# Patient Record
Sex: Male | Born: 1970
Health system: Southern US, Community
[De-identification: ages and names within clinical notes are randomized; demographics above are authoritative.]

## PROBLEM LIST (undated history)

## (undated) DIAGNOSIS — T7840XA Allergy, unspecified, initial encounter: Secondary | ICD-10-CM

## (undated) DIAGNOSIS — I2699 Other pulmonary embolism without acute cor pulmonale: Secondary | ICD-10-CM

## (undated) DIAGNOSIS — I82409 Acute embolism and thrombosis of unspecified deep veins of unspecified lower extremity: Secondary | ICD-10-CM

## (undated) DIAGNOSIS — G5600 Carpal tunnel syndrome, unspecified upper limb: Secondary | ICD-10-CM

## (undated) DIAGNOSIS — I10 Essential (primary) hypertension: Secondary | ICD-10-CM

## (undated) DIAGNOSIS — M1990 Unspecified osteoarthritis, unspecified site: Secondary | ICD-10-CM

## (undated) DIAGNOSIS — M25512 Pain in left shoulder: Secondary | ICD-10-CM

## (undated) DIAGNOSIS — I5030 Unspecified diastolic (congestive) heart failure: Secondary | ICD-10-CM

## (undated) DIAGNOSIS — G4730 Sleep apnea, unspecified: Secondary | ICD-10-CM

## (undated) DIAGNOSIS — K219 Gastro-esophageal reflux disease without esophagitis: Secondary | ICD-10-CM

## (undated) DIAGNOSIS — E669 Obesity, unspecified: Secondary | ICD-10-CM

## (undated) DIAGNOSIS — M25511 Pain in right shoulder: Secondary | ICD-10-CM

## (undated) HISTORY — DX: Obesity, unspecified: E66.9

## (undated) HISTORY — PX: OTHER SURGICAL HISTORY: SHX169

## (undated) HISTORY — PX: COLONOSCOPY: SHX174

## (undated) HISTORY — PX: ROTATOR CUFF REPAIR: SHX139

## (undated) HISTORY — DX: Pain in right shoulder: M25.512

## (undated) HISTORY — DX: Allergy, unspecified, initial encounter: T78.40XA

## (undated) HISTORY — DX: Acute embolism and thrombosis of unspecified deep veins of unspecified lower extremity: I82.409

## (undated) HISTORY — DX: Unspecified diastolic (congestive) heart failure: I50.30

## (undated) HISTORY — DX: Carpal tunnel syndrome, unspecified upper limb: G56.00

## (undated) HISTORY — DX: Pain in right shoulder: M25.511

---
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
## 1999-02-14 ENCOUNTER — Emergency Department (HOSPITAL_COMMUNITY): Admission: EM | Admit: 1999-02-14 | Discharge: 1999-02-15 | Payer: Self-pay | Admitting: *Deleted

## 1999-02-15 ENCOUNTER — Encounter: Payer: Self-pay | Admitting: Emergency Medicine

## 1999-11-09 ENCOUNTER — Ambulatory Visit (HOSPITAL_COMMUNITY): Admission: RE | Admit: 1999-11-09 | Discharge: 1999-11-09 | Payer: Self-pay | Admitting: Family Medicine

## 1999-11-09 ENCOUNTER — Encounter: Payer: Self-pay | Admitting: Family Medicine

## 2000-12-16 ENCOUNTER — Ambulatory Visit (HOSPITAL_BASED_OUTPATIENT_CLINIC_OR_DEPARTMENT_OTHER): Admission: RE | Admit: 2000-12-16 | Discharge: 2000-12-16 | Payer: Self-pay | Admitting: Orthopedic Surgery

## 2001-08-07 ENCOUNTER — Encounter: Admission: RE | Admit: 2001-08-07 | Discharge: 2001-11-05 | Payer: Self-pay | Admitting: Emergency Medicine

## 2002-06-15 ENCOUNTER — Inpatient Hospital Stay (HOSPITAL_COMMUNITY): Admission: RE | Admit: 2002-06-15 | Discharge: 2002-06-16 | Payer: Self-pay | Admitting: Orthopedic Surgery

## 2006-11-03 ENCOUNTER — Ambulatory Visit: Payer: Self-pay | Admitting: Family Medicine

## 2006-11-03 ENCOUNTER — Ambulatory Visit: Payer: Self-pay | Admitting: *Deleted

## 2007-04-17 ENCOUNTER — Ambulatory Visit: Payer: Self-pay | Admitting: Family Medicine

## 2007-04-17 LAB — CONVERTED CEMR LAB
Albumin: 4.8 g/dL (ref 3.5–5.2)
Alkaline Phosphatase: 39 units/L (ref 39–117)
BUN: 24 mg/dL — ABNORMAL HIGH (ref 6–23)
CO2: 27 meq/L (ref 19–32)
Cholesterol: 196 mg/dL (ref 0–200)
Glucose, Bld: 128 mg/dL — ABNORMAL HIGH (ref 70–99)
HDL: 41 mg/dL (ref >39)
Potassium: 4.4 meq/L (ref 3.5–5.3)
Sodium: 141 meq/L (ref 135–145)
Total Bilirubin: 1 mg/dL (ref 0.3–1.2)
Total Protein: 7.9 g/dL (ref 6.0–8.3)
Triglycerides: 118 mg/dL (ref <150)

## 2007-06-19 ENCOUNTER — Ambulatory Visit: Payer: Self-pay | Admitting: Family Medicine

## 2007-06-20 ENCOUNTER — Ambulatory Visit (HOSPITAL_COMMUNITY): Admission: RE | Admit: 2007-06-20 | Discharge: 2007-06-20 | Payer: Self-pay | Admitting: Family Medicine

## 2008-12-17 ENCOUNTER — Encounter: Admission: RE | Admit: 2008-12-17 | Discharge: 2008-12-17 | Payer: Self-pay | Admitting: Specialist

## 2009-09-10 ENCOUNTER — Ambulatory Visit (HOSPITAL_COMMUNITY): Admission: RE | Admit: 2009-09-10 | Discharge: 2009-09-10 | Payer: Self-pay | Admitting: Orthopedic Surgery

## 2009-09-10 ENCOUNTER — Encounter (INDEPENDENT_AMBULATORY_CARE_PROVIDER_SITE_OTHER): Payer: Self-pay | Admitting: Orthopedic Surgery

## 2009-09-10 ENCOUNTER — Ambulatory Visit: Payer: Self-pay | Admitting: Vascular Surgery

## 2010-03-30 ENCOUNTER — Encounter: Admission: RE | Admit: 2010-03-30 | Discharge: 2010-03-30 | Payer: Self-pay | Admitting: Family Medicine

## 2010-09-10 ENCOUNTER — Other Ambulatory Visit: Payer: Self-pay | Admitting: Family Medicine

## 2010-09-10 ENCOUNTER — Ambulatory Visit
Admission: RE | Admit: 2010-09-10 | Discharge: 2010-09-10 | Disposition: A | Discharge status: Home / Self Care | Payer: 59 | Source: Ambulatory Visit | Attending: Family Medicine | Admitting: Family Medicine

## 2010-09-10 DIAGNOSIS — R52 Pain, unspecified: Secondary | ICD-10-CM

## 2010-09-18 NOTE — Op Note (Signed)
NAME:  Scott [name], Scott [name]                      ACCOUNT NO.:  [id number]   MEDICAL RECORD NO.:  [id number]                   PATIENT TYPE:  AMB   LOCATION:  DAY                                  FACILITY:  [facility]   PHYSICIAN:  Marlowe Kays, M.D.               DATE OF BIRTH:  [birth year]   DATE OF PROCEDURE:  06/14/2002  DATE OF DISCHARGE:                                 OPERATIVE REPORT   PREOPERATIVE DIAGNOSIS:  Painful traumatic arthritis, right  acromioclavicular joint.   POSTOPERATIVE DIAGNOSIS:  Painful traumatic arthritis, right  acromioclavicular joint.   PROCEDURE:  Resection, distal right clavicle.   SURGEON:  Marlowe Kays, M.D.   ASSISTANT:  Nurse.   ANESTHESIA:  General.   PATHOLOGY AND JUSTIFICATION FOR PROCEDURE:  Injured his right shoulder over  two years ago with persistent pain at the [facility] joint, which was temporarily  relieved only by local injection.  MRI of the shoulder has been normal.  Because of the persistence of his symptoms, he is here today for surgical  treatment.   DESCRIPTION OF PROCEDURE:  Satisfactory general anesthesia, beach chair  position on the Schlein frame.  The right shoulder girdle was prepped with  Duraprep and draped in a sterile field.  Ioban employed.  Curved incision  centered basically at the [facility] joint.  The [facility] joint and distal clavicle were  identified and with subperiosteal dissection, I exposed the lateral clavicle  and measured out roughly 1.5 cm from the joint, where I marked the clavicle  with a cautery and then undermined it gently at this location and placed two  mini-Bennett retractors to protect the underlying structures.  I then used a  micro-saw and made my osteotomy at this point.  I then grasped the cut  fragment and resected it with a cutting cautery.  I checked, and there were  no bony spicules left on the cut end of the main clavicle, which I then  covered with bone wax.  The wound was irrigated with sterile  saline and the  defect from the resection of the clavicle was packed with Gelfoam.  I then  closed the periosteum-muscle-fascial complex over top with interrupted #1  Vicryl with a nice tight closure.  The subcutaneous tissue was closed with  interrupted 2-0 Vicryl and the skin with Steri-Strips.  A dry sterile  dressing and large shoulder immobilizer were applied.  No Marcaine was  utilized for local anesthesia because of preoperative interscalene block.  He tolerated the procedure well and at the time of this dictation was on his  way to the recovery room in satisfactory condition with no known  complications.                                                Fayrene Fearing  [place], M.D.    JA/MEDQ  D:  06/14/2002  T:  06/14/2002  Job:  [id number]

## 2010-09-18 NOTE — Op Note (Signed)
[place]  Patient:    Scott [name]                             Visit Number: [id number] MRN: [id number]          Service Type: DSU Location: [facility] Attending:  Teressa Senter Douglass Rivers Proc. Date: 12/16/00 Adm. Date:  [id number]   CC:         Eula Listen, M.D., [facility]  Anesthesia Department   Operative Report  PREOPERATIVE DIAGNOSIS:  Chronic ulnar-sided right wrist pain with clinical examination suggestive of possible lunotriquetral interosseous ligament tear and ulnar plus anatomy.  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  PROCEDURES: 1. Diagnostic arthroscopy, right wrist. 2. Arthroscopic debridement of a large dorsal ulnar synovial plica and    synovectomy of right wrist.  SURGEON:  Katy Fitch. Naaman Plummer., M.D.  ANESTHESIA:  General by LMA supervised by the anesthesiologist, J. Claybon Jabs, M.D.  INDICATIONS:  Scott [name] is a 40 year old man who was referred by Dr. Eula Listen from the urgent medical care center for evaluation and management of a chronic right wrist pain.  Clinical examination had suggested a possible LT interosseous ligament tear or chronic ulnocarpal abutment predicament.  Dr. Althea Charon referred him for an MR arthrogram of his wrist that was interpreted by Dr. Barrington Ellison, radiologist, to reveal a possible lunotriquetral ligamentous disruption and an interosseous ganglion along the proximal articular surface of the triquetrum suggestive of possible ulnocarpal abutment.  Due to a failure to respond to nonoperative measures, including steroid injection and splinting, he is brought to the operating room at this time for diagnostic arthroscopy and appropriate intervention.  Preoperatively we advised Mr. Willet that he might require ulnar shortening to decompress his  lunate and triquetrum.  His preoperative neutral films demonstrated that he was approximately 1 mm ulnar plus.  DESCRIPTION OF PROCEDURE:  [name] is brought to the operating room and placed in the supine position on the operating table.  Following induction of general anesthesia by LMA, the right arm was prepped with Betadine soap and solution and sterilely draped.  Ancef 1 g was administered as an IV prophylactic antibiotic.  The wrist was distracted with the aid of a tower designed for wrist arthroscopy with fingertraps on the index and long fingers and counter traction on the forearm.  Ten pounds of traction was applied.  The scope was introduced through standard blunt technique after distention of the wrist with sterile saline through the 3/4 dorsal portal.  Diagnostic arthroscopy revealed moderate synovitis on the dorsal radial aspect of the radioscaphoid articulation.  The hyaline articular cartilage surfaces on the scaphoid, radial aspect of the lunate, and most of the triquetrum were normal. There was normal hyaline articular cartilage in the scaphoid and lunate facets of the radius.  The triangular fibrocartilage had a partial tear at its insertion on the sigmoid notch but did not have a full-thickness tear.  The triangular fibrocartilage was stable.  There was a very large dorsal synovial plica-type shelf that was quite fibrotic and impeded passage of the scope to view the ulnocarpal articulation. With gentle manipulation I was able to get the scope to the ulnar aspect of the wrist and inspect the peripheral triangular fibrocartilage.  There was substantial synovitis on the ulnar aspect of the shelf.  A 6R  portal was created with blunt technique, sounding with an 18-gauge needle.  The suction shaver was used to debride the synovitis and inspect the ulnar aspect of the triangular fibrocartilage.  There was no significant peripheral tear.  The scope was then placed  in the 6R portal and the shaver brought in the 3/4 portal.  With some difficulty the entire dorsal synovial shelf plica was excised.  This opened the dorsal gutter and allowed full visualization of the lunate and triquetrum.  The lunotriquetral interosseous ligament was basically sound.  I suspect the arthrogram revealed a small pinhole tear but did not reveal a significant structural tear.  There was significant chondromalacia on the dorsal ulnar aspect of the lunate consistent with compression against the plica.  Given the relative lack of significant injury to the hyaline articular cartilage surfaces on the lunate and triquetrum, I did not feel that ulnar shortening was indicated.  I completed synovectomy along the dorsal aspect of the wrist, extending to the region of the second dorsal compartment, where Mr. Lappe had intermittent symptoms as well.  The joint was thoroughly lavaged with sterile saline, followed by removal of the arthroscopic equipment.  My final diagnostic impression is that his pain was due primarily to the synovial plica shelf, causing an overload syndrome on his lunate and triquetrum and chondromalacia on the lunate.  For aftercare, his wounds were dressed with Xeroflo, sterile gauze, and a volar plaster splint maintaining the wrist in neutral position.  He will be given prescriptions for Percocet 5 mg one or two tablets p.o. q.4-6h. p.r.n. pain, a total of 24 tablets without refill.  He will continue on his normal medication for this type 2 diabetes, and will return for follow-up in our office in one week to begin a therapy program.  He is advised to keep his dressing dry and his fingers moving to maintain a full range of motion. DD:  12/16/00 TD:  12/16/00 Job: [id number]

## 2011-02-05 ENCOUNTER — Emergency Department (HOSPITAL_COMMUNITY)
Admission: EM | Admit: 2011-02-05 | Discharge: 2011-02-05 | Disposition: A | Discharge status: Home / Self Care | Payer: No Typology Code available for payment source | Attending: Emergency Medicine | Admitting: Emergency Medicine

## 2011-02-05 ENCOUNTER — Emergency Department (HOSPITAL_COMMUNITY): Payer: No Typology Code available for payment source

## 2011-02-05 DIAGNOSIS — Z79899 Other long term (current) drug therapy: Secondary | ICD-10-CM | POA: Insufficient documentation

## 2011-02-05 DIAGNOSIS — T1490XA Injury, unspecified, initial encounter: Secondary | ICD-10-CM | POA: Insufficient documentation

## 2011-02-05 DIAGNOSIS — I10 Essential (primary) hypertension: Secondary | ICD-10-CM | POA: Insufficient documentation

## 2011-02-05 DIAGNOSIS — E119 Type 2 diabetes mellitus without complications: Secondary | ICD-10-CM | POA: Insufficient documentation

## 2011-02-05 DIAGNOSIS — M549 Dorsalgia, unspecified: Secondary | ICD-10-CM | POA: Insufficient documentation

## 2011-02-05 DIAGNOSIS — IMO0002 Reserved for concepts with insufficient information to code with codable children: Secondary | ICD-10-CM | POA: Insufficient documentation

## 2011-02-05 DIAGNOSIS — M545 Low back pain, unspecified: Secondary | ICD-10-CM | POA: Insufficient documentation

## 2011-02-05 DIAGNOSIS — Y92410 Unspecified street and highway as the place of occurrence of the external cause: Secondary | ICD-10-CM | POA: Insufficient documentation

## 2011-02-05 DIAGNOSIS — R51 Headache: Secondary | ICD-10-CM | POA: Insufficient documentation

## 2011-02-07 ENCOUNTER — Emergency Department (HOSPITAL_COMMUNITY)
Admission: EM | Admit: 2011-02-07 | Discharge: 2011-02-07 | Disposition: A | Discharge status: Home / Self Care | Payer: No Typology Code available for payment source | Attending: Emergency Medicine | Admitting: Emergency Medicine

## 2011-02-07 ENCOUNTER — Emergency Department (HOSPITAL_COMMUNITY): Payer: No Typology Code available for payment source

## 2011-02-07 DIAGNOSIS — E119 Type 2 diabetes mellitus without complications: Secondary | ICD-10-CM | POA: Insufficient documentation

## 2011-02-07 DIAGNOSIS — I10 Essential (primary) hypertension: Secondary | ICD-10-CM | POA: Insufficient documentation

## 2011-02-07 DIAGNOSIS — T148XXA Other injury of unspecified body region, initial encounter: Secondary | ICD-10-CM | POA: Insufficient documentation

## 2011-02-07 DIAGNOSIS — M546 Pain in thoracic spine: Secondary | ICD-10-CM | POA: Insufficient documentation

## 2011-02-07 DIAGNOSIS — S060X9A Concussion with loss of consciousness of unspecified duration, initial encounter: Secondary | ICD-10-CM | POA: Insufficient documentation

## 2011-02-07 DIAGNOSIS — R51 Headache: Secondary | ICD-10-CM | POA: Insufficient documentation

## 2011-05-04 DIAGNOSIS — I2699 Other pulmonary embolism without acute cor pulmonale: Secondary | ICD-10-CM

## 2011-05-04 HISTORY — DX: Other pulmonary embolism without acute cor pulmonale: I26.99

## 2011-09-02 ENCOUNTER — Emergency Department (HOSPITAL_COMMUNITY): Payer: 59

## 2011-09-02 ENCOUNTER — Encounter (HOSPITAL_COMMUNITY): Payer: Self-pay | Admitting: Emergency Medicine

## 2011-09-02 ENCOUNTER — Inpatient Hospital Stay (HOSPITAL_COMMUNITY)
Admission: EM | Admit: 2011-09-02 | Discharge: 2011-09-05 | DRG: 175 | Disposition: A | Discharge status: Home / Self Care | Payer: 59 | Attending: Family Medicine | Admitting: Family Medicine

## 2011-09-02 DIAGNOSIS — I152 Hypertension secondary to endocrine disorders: Secondary | ICD-10-CM | POA: Diagnosis present

## 2011-09-02 DIAGNOSIS — Z79899 Other long term (current) drug therapy: Secondary | ICD-10-CM

## 2011-09-02 DIAGNOSIS — I82431 Acute embolism and thrombosis of right popliteal vein: Secondary | ICD-10-CM | POA: Diagnosis present

## 2011-09-02 DIAGNOSIS — I824Y9 Acute embolism and thrombosis of unspecified deep veins of unspecified proximal lower extremity: Secondary | ICD-10-CM | POA: Diagnosis present

## 2011-09-02 DIAGNOSIS — I5189 Other ill-defined heart diseases: Secondary | ICD-10-CM | POA: Diagnosis present

## 2011-09-02 DIAGNOSIS — E1169 Type 2 diabetes mellitus with other specified complication: Secondary | ICD-10-CM | POA: Diagnosis present

## 2011-09-02 DIAGNOSIS — J9601 Acute respiratory failure with hypoxia: Secondary | ICD-10-CM | POA: Diagnosis present

## 2011-09-02 DIAGNOSIS — I2699 Other pulmonary embolism without acute cor pulmonale: Principal | ICD-10-CM | POA: Diagnosis present

## 2011-09-02 DIAGNOSIS — I519 Heart disease, unspecified: Secondary | ICD-10-CM | POA: Diagnosis present

## 2011-09-02 DIAGNOSIS — I82409 Acute embolism and thrombosis of unspecified deep veins of unspecified lower extremity: Secondary | ICD-10-CM

## 2011-09-02 DIAGNOSIS — I10 Essential (primary) hypertension: Secondary | ICD-10-CM | POA: Diagnosis present

## 2011-09-02 DIAGNOSIS — S93402A Sprain of unspecified ligament of left ankle, initial encounter: Secondary | ICD-10-CM | POA: Diagnosis present

## 2011-09-02 DIAGNOSIS — Z7901 Long term (current) use of anticoagulants: Secondary | ICD-10-CM

## 2011-09-02 DIAGNOSIS — E1159 Type 2 diabetes mellitus with other circulatory complications: Secondary | ICD-10-CM | POA: Diagnosis present

## 2011-09-02 DIAGNOSIS — Z23 Encounter for immunization: Secondary | ICD-10-CM

## 2011-09-02 DIAGNOSIS — E119 Type 2 diabetes mellitus without complications: Secondary | ICD-10-CM | POA: Diagnosis present

## 2011-09-02 DIAGNOSIS — R0902 Hypoxemia: Secondary | ICD-10-CM | POA: Diagnosis present

## 2011-09-02 DIAGNOSIS — J9600 Acute respiratory failure, unspecified whether with hypoxia or hypercapnia: Secondary | ICD-10-CM | POA: Diagnosis present

## 2011-09-02 HISTORY — DX: Essential (primary) hypertension: I10

## 2011-09-02 LAB — DIFFERENTIAL
Basophils Absolute: 0 10*3/uL (ref 0.0–0.1)
Eosinophils Absolute: 0 10*3/uL (ref 0.0–0.7)
Lymphs Abs: 1.4 10*3/uL (ref 0.7–4.0)
Monocytes Absolute: 0.7 10*3/uL (ref 0.1–1.0)
Monocytes Relative: 9 % (ref 3–12)
Neutro Abs: 5.3 10*3/uL (ref 1.7–7.7)

## 2011-09-02 LAB — CBC
HCT: 46.3 % (ref 39.0–52.0)
MCHC: 35 g/dL (ref 30.0–36.0)
MCV: 74.2 fL — ABNORMAL LOW (ref 78.0–100.0)
Platelets: 141 10*3/uL — ABNORMAL LOW (ref 150–400)
RDW: 13.7 % (ref 11.5–15.5)
WBC: 7.4 10*3/uL (ref 4.0–10.5)

## 2011-09-02 LAB — GLUCOSE, CAPILLARY
Glucose-Capillary: 149 mg/dL — ABNORMAL HIGH (ref 70–99)
Glucose-Capillary: 176 mg/dL — ABNORMAL HIGH (ref 70–99)

## 2011-09-02 LAB — HOMOCYSTEINE: Homocysteine: 7.5 umol/L (ref 4.0–15.4)

## 2011-09-02 LAB — POCT I-STAT, CHEM 8
Calcium, Ion: 1.17 mmol/L (ref 1.12–1.32)
HCT: 52 % (ref 39.0–52.0)
Hemoglobin: 17.7 g/dL — ABNORMAL HIGH (ref 13.0–17.0)
Sodium: 136 mEq/L (ref 135–145)
TCO2: 29 mmol/L (ref 0–100)

## 2011-09-02 LAB — POCT I-STAT TROPONIN I: Troponin i, poc: 0.17 ng/mL (ref 0.00–0.08)

## 2011-09-02 LAB — PROTIME-INR: Prothrombin Time: 13.8 seconds (ref 11.6–15.2)

## 2011-09-02 MED ORDER — ACETAMINOPHEN 650 MG RE SUPP: 650.0000 mg | Freq: Four times a day (QID) | RECTAL | Status: DC | PRN

## 2011-09-02 MED ORDER — HYDROCODONE-ACETAMINOPHEN 5-325 MG PO TABS
1.0000 | ORAL_TABLET | ORAL | Status: DC | PRN
  Administered 2011-09-02: 1 via ORAL
  Administered 2011-09-02 – 2011-09-03 (×2): 2 via ORAL
  Administered 2011-09-03: 1 via ORAL
  Administered 2011-09-04: 2 via ORAL
  Administered 2011-09-05: 1 via ORAL
  Filled 2011-09-02 (×2): qty 1
  Filled 2011-09-02: qty 2
  Filled 2011-09-02: qty 1
  Filled 2011-09-02 (×2): qty 2
  Filled 2011-09-02: qty 1

## 2011-09-02 MED ORDER — HEPARIN SODIUM (PORCINE) 5000 UNIT/ML IJ SOLN
INTRAMUSCULAR | Status: AC
  Filled 2011-09-02: qty 1

## 2011-09-02 MED ORDER — SODIUM CHLORIDE 0.9 % IV SOLN: INTRAVENOUS | Status: DC

## 2011-09-02 MED ORDER — IOHEXOL 350 MG/ML SOLN
100.0000 mL | Freq: Once | INTRAVENOUS | Status: AC | PRN
  Administered 2011-09-02: 100 mL via INTRAVENOUS

## 2011-09-02 MED ORDER — ENOXAPARIN SODIUM 120 MG/0.8ML ~~LOC~~ SOLN
110.0000 mg | Freq: Once | SUBCUTANEOUS | Status: AC
  Administered 2011-09-02: 110 mg via SUBCUTANEOUS
  Filled 2011-09-02: qty 0.8

## 2011-09-02 MED ORDER — HEPARIN (PORCINE) IN NACL 100-0.45 UNIT/ML-% IJ SOLN
1400.0000 [IU]/h | Freq: Once | INTRAMUSCULAR | Status: AC
  Administered 2011-09-02: 1400 [IU]/h via INTRAVENOUS
  Filled 2011-09-02: qty 250

## 2011-09-02 MED ORDER — INSULIN ASPART 100 UNIT/ML ~~LOC~~ SOLN
0.0000 [IU] | Freq: Three times a day (TID) | SUBCUTANEOUS | Status: DC
  Administered 2011-09-02 (×2): 3 [IU] via SUBCUTANEOUS
  Administered 2011-09-03: 2 [IU] via SUBCUTANEOUS
  Administered 2011-09-03: 5 [IU] via SUBCUTANEOUS
  Administered 2011-09-04: 2 [IU] via SUBCUTANEOUS
  Administered 2011-09-04: 3 [IU] via SUBCUTANEOUS
  Administered 2011-09-05: 2 [IU] via SUBCUTANEOUS
  Administered 2011-09-05: 1 [IU] via SUBCUTANEOUS

## 2011-09-02 MED ORDER — ACETAMINOPHEN 325 MG PO TABS: 650.0000 mg | ORAL_TABLET | Freq: Four times a day (QID) | ORAL | Status: DC | PRN

## 2011-09-02 MED ORDER — HEPARIN BOLUS VIA INFUSION
4000.0000 [IU] | Freq: Once | INTRAVENOUS | Status: AC
  Administered 2011-09-02: 4000 [IU] via INTRAVENOUS

## 2011-09-02 MED ORDER — PNEUMOCOCCAL VAC POLYVALENT 25 MCG/0.5ML IJ INJ
0.5000 mL | INJECTION | INTRAMUSCULAR | Status: AC
  Administered 2011-09-03: 0.5 mL via INTRAMUSCULAR
  Filled 2011-09-02: qty 0.5

## 2011-09-02 MED ORDER — SODIUM CHLORIDE 0.9 % IJ SOLN
3.0000 mL | Freq: Two times a day (BID) | INTRAMUSCULAR | Status: DC
  Administered 2011-09-02 – 2011-09-03 (×2): 3 mL via INTRAVENOUS

## 2011-09-02 MED ORDER — ENOXAPARIN SODIUM 120 MG/0.8ML ~~LOC~~ SOLN
110.0000 mg | Freq: Two times a day (BID) | SUBCUTANEOUS | Status: DC
  Administered 2011-09-02 – 2011-09-05 (×6): 110 mg via SUBCUTANEOUS
  Filled 2011-09-02 (×9): qty 0.8

## 2011-09-02 MED ORDER — ALUM & MAG HYDROXIDE-SIMETH 200-200-20 MG/5ML PO SUSP: 30.0000 mL | Freq: Four times a day (QID) | ORAL | Status: DC | PRN

## 2011-09-02 NOTE — ED Notes (Signed)
Spoke with Onalee Hua in Pharmacy. Stated he will send Heparin bolus asap. He is going to follow up with IV room in pharmacy. Will continue to monitor.

## 2011-09-02 NOTE — ED Notes (Addendum)
Per EMS, SOB started about 9pm last night. He was walking to car when he became SOB. He also became light-headed. He did not pass out. Pt stated that he started taking Vicodin for left ankle sprain within the last 24 hours. Lungs clear. Pt also complaining of left sided chest tightness. Does not radiate. No nausea or vomiting. Pt stated that he sometimes feel like his heart is skipping a beat. Currently not doing so at this time. Chest tightness is 6 out of 10.  Vitals: 128, 132/78, 16. CBG 206. No IV or medications given. Hx: DM and HTN. When EMS got there, pt was on the phone with cardiologist. Pt was placed on 2L O2 N/C. Without oxygen, pt becomes anxious. 12 lead unremarkable, showed sinus tachycardia. Will continue to monitor.

## 2011-09-02 NOTE — ED Provider Notes (Signed)
History     CSN: [id number]  Arrival date & time 09/02/11  0501   First MD Initiated Contact with Patient 09/02/11 [phone]      Chief Complaint  Patient presents with  . Chest Pain  . Tachycardia  . Shortness of Breath    (Consider location/radiation/quality/duration/timing/severity/associated sxs/prior treatment) HPI Comments: 41 year old male with a history of hypertension and diabetes who presents with acute onset of shortness of breath which started approximately 9:00 last night. He states that as he was walking to his car he became acutely short of breath and developed a substernal chest pressure. At this time he also became lightheaded and near syncopal. This has been persistent, nothing makes it better, worse with exertion, not associated with fevers, chills, cough, abdominal pain, leg swelling other than a sprained ankle for which she has been taking Vicodin for the last several days. Supplemental oxygen by EMS a significant improvement. The patient reports recently being seen by a cardiologist Dr. Jacinto Halim and having a normal stress test 4 months ago.  Patient is a 41 y.o. male presenting with chest pain and shortness of breath. The history is provided by the patient.  Chest Pain Primary symptoms include shortness of breath.    Shortness of Breath  Associated symptoms include chest pain and shortness of breath.    Past Medical History  Diagnosis Date  . Diabetes mellitus   . Hypertension     History reviewed. No pertinent past surgical history.  History reviewed. No pertinent family history.  History  Substance Use Topics  . Smoking status: Never Smoker   . Smokeless tobacco: Not on file  . Alcohol Use: No      Review of Systems  Respiratory: Positive for shortness of breath.   Cardiovascular: Positive for chest pain.  All other systems reviewed and are negative.    Allergies  Review of patient's allergies indicates no known allergies.  Home Medications    Current Outpatient Rx  Name Route Sig Dispense Refill  . LISINOPRIL PO Oral Take by mouth.    . METFORMIN HCL 500 MG PO TABS Oral Take 500 mg by mouth 2 (two) times daily with a meal.    . PRESCRIPTION MEDICATION  For pain...hydrocodone      BP 143/87  Pulse 119  Temp(Src) 98.8 F (37.1 C) (Oral)  Resp 21  Wt 238 lb (107.956 kg)  SpO2 98%  Physical Exam  Nursing note and vitals reviewed. Constitutional: He appears well-developed and well-nourished.       Mild tachypnea  HENT:  Head: Normocephalic and atraumatic.  Mouth/Throat: Oropharynx is clear and moist. No oropharyngeal exudate.  Eyes: Conjunctivae and EOM are normal. Pupils are equal, round, and reactive to light. Right eye exhibits no discharge. Left eye exhibits no discharge. No scleral icterus.  Neck: Normal range of motion. Neck supple. No JVD present. No thyromegaly present.  Cardiovascular: Regular rhythm, normal heart sounds and intact distal pulses.  Exam reveals no gallop and no friction rub.   No murmur heard.      Tachycardic to 120  Pulmonary/Chest: Effort normal and breath sounds normal. No respiratory distress. He has no wheezes. He has no rales.  Abdominal: Soft. Bowel sounds are normal. He exhibits no distension and no mass. There is no tenderness.  Musculoskeletal: Normal range of motion. He exhibits tenderness ( Left ankle tenderness with mild swelling, no edema of the lower extremities). He exhibits no edema.  Lymphadenopathy:    He has no cervical adenopathy.  Neurological: He is alert. Coordination normal.  Skin: Skin is warm and dry. No rash noted. No erythema.  Psychiatric: He has a normal mood and affect. His behavior is normal.    ED Course  Procedures (including critical care time)  Labs Reviewed  CBC - Abnormal; Notable for the following:    RBC 6.24 (*)    MCV 74.2 (*)    Platelets 141 (*)    All other components within normal limits  POCT I-STAT, CHEM 8 - Abnormal; Notable for the  following:    Glucose, Bld 234 (*)    Hemoglobin 17.7 (*)    All other components within normal limits  POCT I-STAT TROPONIN I - Abnormal; Notable for the following:    Troponin i, poc 0.17 (*)    All other components within normal limits  DIFFERENTIAL  APTT  PROTIME-INR   Ct Angio Chest W/cm &/or Wo Cm  09/02/2011  *RADIOLOGY REPORT*  Clinical Data: Chest pain.  CT ANGIOGRAPHY CHEST  Technique:  Multidetector CT imaging of the chest using the standard protocol during bolus administration of intravenous contrast. Multiplanar reconstructed images including MIPs were obtained and reviewed to evaluate the vascular anatomy.  Contrast: OMNIPAQUE IOHEXOL 350 MG/ML SOLN  Comparison: 09/02/2011 radiograph  Findings: Positive for pulmonary embolism.  There are large filling defects within bilateral lower lobe and left upper branches. One of the clots within the left main pulmonary artery extends centrally across the base of the main pulmonary artery.  Additional filling defect noted within an upper lobe segmental branch on the right. Normal caliber aorta.  Normal heart size. No CT evidence for right ventricular heart strain.  No pleural or pericardial effusion.  Limited images through the upper abdomen show no acute abnormality.  Wedge-shaped patchy opacity within the right upper lobe peripherally.  No pneumothorax.  No additional areas of consolidation.  Central airways are patent.  No acute osseous abnormality.  IMPRESSION: Bilateral pulmonary emboli with extensive clot burden as detailed above.  No CT evidence for right ventricular heart strain.  Wedge-shaped opacity within the periphery of the right upper lobe is suspicious for pulmonary infarction.  Discussed via telephone with Dr. Hyacinth Meeker at 06:00 a.m. on 09/02/2011.  Original Report Authenticated By: Waneta Martins, M.D.   Dg Chest Port 1 View  09/02/2011  *RADIOLOGY REPORT*  Clinical Data: Chest pain, shortness of breath.  PORTABLE CHEST - 1  VIEW  Comparison: None.  Findings: Aortic arch atherosclerosis.  Hypoaeration results in interstitial crowding and mild bibasilar atelectasis.  Heart size upper normal limits.  No pleural effusion or pneumothorax.   No acute osseous abnormality identified.  IMPRESSION:  Hypoaeration results in interstitial crowding and mild bibasilar atelectasis. Otherwise, no radiographic evidence for acute cardiopulmonary process.  Original Report Authenticated By: Waneta Martins, M.D.     1. Pulmonary embolism       MDM  Patient is acutely tachycardic and having air hunger, oxygen on room air is 98% but is mildly cachectic. We'll perform CT angiogram to rule out pulmonary embolism, laboratory data, reevaluate.  ED ECG REPORT   Date: 09/02/2011   Rate: 116  Rhythm: sinus tachycardia  QRS Axis: normal  Intervals: normal  ST/T Wave abnormalities: nonspecific T wave changes  Conduction Disutrbances:none  Narrative Interpretation:   Old EKG Reviewed: Compared with EKG from 06/08/2002, rate faster and axis slightly more rightward, otherwise no significant changes   I have personally reviewed the CT scan for the patient and find her  to be a large burden of pulmonary embolus in the bilateral lungs, left greater than right. I have discussed this finding with the radiologist by phone, I have informed the patient and have started anticoagulation with unfractionated heparin by bolus and drip. Patient currently is hemodynamically stable with a pulse of 110, oxygen is at 98% and a blood pressure of 140/87.   CRITICAL CARE Performed by: Vida Roller   Total critical care time: 35  Critical care time was exclusive of separately billable procedures and treating other patients.  Critical care was necessary to treat or prevent imminent or life-threatening deterioration.  Critical care was time spent personally by me on the following activities: development of treatment plan with patient and/or surrogate as  well as nursing, discussions with consultants, evaluation of patient's response to treatment, examination of patient, obtaining history from patient or surrogate, ordering and performing treatments and interventions, ordering and review of laboratory studies, ordering and review of radiographic studies, pulse oximetry and re-evaluation of patient's condition.     Vida Roller, MD 09/02/11 [phone]

## 2011-09-02 NOTE — ED Notes (Signed)
Dr. Laza at bedside 

## 2011-09-02 NOTE — H&P (Signed)
PCP:   Evlyn Courier, MD, MD   Chief Complaint:  Chest pain   HPI: 41 yo man without significant pmhx presented to the ED tonight with sudden onset of dyspnea and chest pain. Found to have large bilateral PEs.  No recent travel No family hx of blood clots No recent surgery  Injured his ankle 1 week ago and has been less mobile.    Review of Systems:  The patient denies anorexia, fever, weight loss,, vision loss, decreased hearing, hoarseness,  syncope, dyspnea on exertion, peripheral edema, balance deficits, hemoptysis, abdominal pain, melena, hematochezia, severe indigestion/heartburn, hematuria, incontinence, muscle weakness, suspicious skin lesions, transient blindness, difficulty walking, depression, unusual weight change, abnormal bleeding, enlarged lymph nodes, angioedema,  Past Medical History: Past Medical History  Diagnosis Date  . Diabetes mellitus   . Hypertension    History reviewed. No pertinent past surgical history.  Medications: Prior to Admission medications   Medication Sig Start Date End Date Taking? Authorizing Provider  LISINOPRIL PO Take by mouth.   Yes Historical Provider, MD  metFORMIN (GLUCOPHAGE) 500 MG tablet Take 500 mg by mouth 2 (two) times daily with a meal.   Yes Historical Provider, MD  PRESCRIPTION MEDICATION For pain...hydrocodone   Yes Historical Provider, MD    Allergies:  No Known Allergies  Social History:  reports that he has never smoked. He has never used smokeless tobacco. He reports that he does not drink alcohol or use illicit drugs.  History   Social History Narrative  . No narrative on file     Family History: History reviewed. No pertinent family history.  Physical Exam: Filed Vitals:   09/02/11 0600 09/02/11 0615 09/02/11 0630 09/02/11 0640  BP: 131/78 127/80 136/91 136/91  Pulse: 110 109 103 104  Temp:      TempSrc:      Resp: 16 24 19 24   Weight:      SpO2: 100% 98% 99% 100%   General appearance: alert,  cooperative, appears stated age and no distress Head: Normocephalic, without obvious abnormality, atraumatic Eyes: conjunctivae/corneas clear. PERRL, EOM's intact. Fundi benign. Nose: Nares normal. Septum midline. Mucosa normal. No drainage or sinus tenderness. Throat: lips, mucosa, and tongue normal; teeth and gums normal Neck: no adenopathy, no carotid bruit, no JVD, supple, symmetrical, trachea midline and thyroid not enlarged, symmetric, no tenderness/mass/nodules Resp: clear to auscultation bilaterally Chest wall: no tenderness Cardio: regular rate and rhythm, S1, S2 normal, no murmur, click, rub or gallop GI: soft, non-tender; bowel sounds normal; no masses,  no organomegaly Extremities: extremities normal, atraumatic, no cyanosis or edema Pulses: 2+ and symmetric Skin: Skin color, texture, turgor normal. No rashes or lesions Neurologic: Alert and oriented X 3, normal strength and tone. Normal symmetric reflexes. Normal coordination and gait   Labs on Admission:   [facility] 09/02/11 0520  NA 136  K 3.8  CL 98  CO2 --  GLUCOSE 234*  BUN 18  CREATININE 1.10  CALCIUM --  MG --  PHOS --   No results found for this basename: AST:2,ALT:2,ALKPHOS:2,BILITOT:2,PROT:2,ALBUMIN:2 in the last 72 hours No results found for this basename: LIPASE:2,AMYLASE:2 in the last 72 hours  Basename 09/02/11 0520 09/02/11 0516  WBC -- 7.4  NEUTROABS -- 5.3  HGB 17.7* 16.2  HCT 52.0 46.3  MCV -- 74.2*  PLT -- 141*   No results found for this basename: CKTOTAL:3,CKMB:3,CKMBINDEX:3,TROPONINI:3 in the last 72 hours No results found for this basename: TSH,T4TOTAL,FREET3,T3FREE,THYROIDAB in the last 72 hours No results found for  this basename: VITAMINB12:2,FOLATE:2,FERRITIN:2,TIBC:2,IRON:2,RETICCTPCT:2 in the last 72 hours  Radiological Exams on Admission: Ct Angio Chest W/cm &/or Wo Cm  09/02/2011  *RADIOLOGY REPORT*  Clinical Data: Chest pain.  CT ANGIOGRAPHY CHEST  Technique:  Multidetector CT  imaging of the chest using the standard protocol during bolus administration of intravenous contrast. Multiplanar reconstructed images including MIPs were obtained and reviewed to evaluate the vascular anatomy.  Contrast: OMNIPAQUE IOHEXOL 350 MG/ML SOLN  Comparison: 09/02/2011 radiograph  Findings: Positive for pulmonary embolism.  There are large filling defects within bilateral lower lobe and left upper branches. One of the clots within the left main pulmonary artery extends centrally across the base of the main pulmonary artery.  Additional filling defect noted within an upper lobe segmental branch on the right. Normal caliber aorta.  Normal heart size. No CT evidence for right ventricular heart strain.  No pleural or pericardial effusion.  Limited images through the upper abdomen show no acute abnormality.  Wedge-shaped patchy opacity within the right upper lobe peripherally.  No pneumothorax.  No additional areas of consolidation.  Central airways are patent.  No acute osseous abnormality.  IMPRESSION: Bilateral pulmonary emboli with extensive clot burden as detailed above.  No CT evidence for right ventricular heart strain.  Wedge-shaped opacity within the periphery of the right upper lobe is suspicious for pulmonary infarction.  Discussed via telephone with Dr. Hyacinth Meeker at 06:00 a.m. on 09/02/2011.  Original Report Authenticated By: Waneta Martins, M.D.   Dg Chest Port 1 View  09/02/2011  *RADIOLOGY REPORT*  Clinical Data: Chest pain, shortness of breath.  PORTABLE CHEST - 1 VIEW  Comparison: None.  Findings: Aortic arch atherosclerosis.  Hypoaeration results in interstitial crowding and mild bibasilar atelectasis.  Heart size upper normal limits.  No pleural effusion or pneumothorax.   No acute osseous abnormality identified.  IMPRESSION:  Hypoaeration results in interstitial crowding and mild bibasilar atelectasis. Otherwise, no radiographic evidence for acute cardiopulmonary process.  Original  Report Authenticated By: Waneta Martins, M.D.    Assessment/Plan Present on Admission:  .Pulmonary embolism .DM type 2 (diabetes mellitus, type 2) .HTN (hypertension), benign .Left ankle sprain  1. PE - unclear why he would develop such an extensive PE. Will send hypercoagulable w/u which is interpretable (e.g. Factor V leiden, PT gene mutation, cardiolipin). Start lovenox and aim for using Xarelto at DC , after making sure he remains hemodynamically stable. Will check LE venous dopplers to see if there is any residual clot.   2. DM2 - ssi for now  3. Left ankle sprain - symptomatic rx  Deseray Daponte 09/02/2011, 7:30 AM

## 2011-09-02 NOTE — ED Notes (Signed)
Dr. Eber Hong shown critical Troponin I-stat value

## 2011-09-02 NOTE — Care Management Note (Signed)
  Page 1 of 1   09/03/2011     3:42:59 PM   CARE MANAGEMENT NOTE 09/03/2011  Patient:  Scott [name], Scott [name]   Account Number:  [id number]  Date Initiated:  09/02/2011  Documentation initiated by:  Junius Creamer  Subjective/Objective Assessment:   adm w pul embolus     Action/Plan:   lives w wife   Anticipated DC Date:  09/05/2011   Anticipated DC Plan:  HOME W HOME HEALTH SERVICES      DC Planning Services  CM consult      Choice offered to / List presented to:             Status of service:   Medicare Important Message given?   (If response is "NO", the following Medicare IM given date fields will be blank) Date Medicare IM given:   Date Additional Medicare IM given:    Discharge Disposition:    Per UR Regulation:    If discussed at Long Length of Stay Meetings, dates discussed:    Comments:  pcp: dr gerald hill 5/3 15:41p per cm sec lovenox brand name not covered and generic covered under tier 2 at copay of 35.00. debbie Blaize Nipper rn,bsn 09/02/11/16:34p cm sec checked, has 35.00 copay for med but only covers 42 pills in 23 days, may need prior auth at [phone]. will work on this. 09-02-11 having cm sec ck on copay for xarelto. debbie Vincente Asbridge rn,bsn [id number]

## 2011-09-02 NOTE — Progress Notes (Signed)
Utilization Review Completed.  Scott [name]  09/02/2011  

## 2011-09-02 NOTE — Progress Notes (Signed)
VASCULAR LAB PRELIMINARY  PRELIMINARY  PRELIMINARY  PRELIMINARY  Bilateral lower extremity venous Doppler completed.    Preliminary report:  Area of mixed echoes noted in the right popliteal vein, and although it compresses and there seems to be good color fill, I cannot rule out non occlusive DVT.  All other veins appear thrombus free.  Sherren Kerns Knapp, 09/02/2011, 10:22 AM

## 2011-09-02 NOTE — Progress Notes (Signed)
ANTICOAGULATION CONSULT NOTE - Initial Consult  Pharmacy Consult for Lovenox Indication: pulmonary embolus  No Known Allergies  Patient Measurements: Weight: 238 lb (107.956 kg)  Vital Signs: Temp: 98.8 F (37.1 C) (05/02 0505) Temp src: Oral (05/02 0505) BP: 136/91 mmHg (05/02 0640) Pulse Rate: 104  (05/02 0640)  Labs:  Basename 09/02/11 0615 09/02/11 0520 09/02/11 0516  HGB -- 17.7* 16.2  HCT -- 52.0 46.3  PLT -- -- 141*  APTT 30 -- --  LABPROT 13.8 -- --  INR 1.04 -- --  HEPARINUNFRC -- -- --  CREATININE -- 1.10 --  CKTOTAL -- -- --  CKMB -- -- --  TROPONINI -- -- --   Medical History: Past Medical History  Diagnosis Date  . Diabetes mellitus   . Hypertension    Assessment: 40yo c/o acute onset of SOB with chest pressure, found on CT to have bilateral PE with extensive clot burden, to start Lovenox.   Plan:  Will begin Lovenox 110mg  SQ Q12H, monitor CBC, and f/u long-term anti-coag plans.  Colleen Can PharmD BCPS 09/02/2011,7:17 AM

## 2011-09-02 NOTE — Progress Notes (Signed)
  Echocardiogram 2D Echocardiogram has been performed.  Jorje Guild [facility]ter[facility] Lopez 09/02/2011, 10:27 AM

## 2011-09-03 DIAGNOSIS — I10 Essential (primary) hypertension: Secondary | ICD-10-CM

## 2011-09-03 DIAGNOSIS — I2699 Other pulmonary embolism without acute cor pulmonale: Secondary | ICD-10-CM

## 2011-09-03 DIAGNOSIS — I519 Heart disease, unspecified: Secondary | ICD-10-CM | POA: Diagnosis present

## 2011-09-03 DIAGNOSIS — I82431 Acute embolism and thrombosis of right popliteal vein: Secondary | ICD-10-CM | POA: Diagnosis present

## 2011-09-03 DIAGNOSIS — I5189 Other ill-defined heart diseases: Secondary | ICD-10-CM | POA: Diagnosis present

## 2011-09-03 DIAGNOSIS — J9601 Acute respiratory failure with hypoxia: Secondary | ICD-10-CM | POA: Diagnosis present

## 2011-09-03 LAB — BASIC METABOLIC PANEL
BUN: 16 mg/dL (ref 6–23)
CO2: 32 mEq/L (ref 19–32)
Calcium: 9.3 mg/dL (ref 8.4–10.5)
Creatinine, Ser: 1.19 mg/dL (ref 0.50–1.35)
GFR calc non Af Amer: 74 mL/min — ABNORMAL LOW (ref >90)
Glucose, Bld: 139 mg/dL — ABNORMAL HIGH (ref 70–99)
Sodium: 138 mEq/L (ref 135–145)

## 2011-09-03 LAB — CBC
MCH: 25.6 pg — ABNORMAL LOW (ref 26.0–34.0)
MCHC: 34.6 g/dL (ref 30.0–36.0)
MCV: 74 fL — ABNORMAL LOW (ref 78.0–100.0)
Platelets: 130 10*3/uL — ABNORMAL LOW (ref 150–400)
RBC: 6.05 MIL/uL — ABNORMAL HIGH (ref 4.22–5.81)

## 2011-09-03 LAB — GLUCOSE, CAPILLARY
Glucose-Capillary: 196 mg/dL — ABNORMAL HIGH (ref 70–99)
Glucose-Capillary: 127 mg/dL — ABNORMAL HIGH (ref 70–99)

## 2011-09-03 MED ORDER — WARFARIN - PHARMACIST DOSING INPATIENT: Freq: Every day | Status: DC

## 2011-09-03 MED ORDER — WARFARIN SODIUM 10 MG PO TABS
10.0000 mg | ORAL_TABLET | ORAL | Status: AC
  Administered 2011-09-03: 10 mg via ORAL
  Filled 2011-09-03: qty 1

## 2011-09-03 MED ORDER — PATIENT'S GUIDE TO USING COUMADIN BOOK
Freq: Once | Status: AC
  Administered 2011-09-03: 19:00:00
  Filled 2011-09-03: qty 1

## 2011-09-03 MED ORDER — WARFARIN VIDEO
Freq: Once | Status: AC
  Administered 2011-09-04: 12:00:00

## 2011-09-03 NOTE — Progress Notes (Signed)
ANTICOAGULATION CONSULT NOTE - Initial Consult  Pharmacy Consult for Lovenox and Coumadin Indication: pulmonary embolus  No Known Allergies  Patient Measurements: Height: 5\' 6"  (167.6 cm) Weight: 233 lb 11.2 oz (106.006 kg) IBW/kg (Calculated) : 63.8   Vital Signs: Temp: 97.7 F (36.5 C) (05/03 1718) Temp src: Oral (05/03 1718) BP: 121/90 mmHg (05/03 1718)  Labs:  Alvira Philips 09/03/11 0508 09/02/11 0615 09/02/11 0520 09/02/11 0516  HGB 15.5 -- 17.7* --  HCT 44.8 -- 52.0 46.3  PLT 130* -- -- 141*  APTT -- 30 -- --  LABPROT -- 13.8 -- --  INR -- 1.04 -- --  HEPARINUNFRC -- -- -- --  CREATININE 1.19 -- 1.10 --  CKTOTAL -- -- -- --  CKMB -- -- -- --  TROPONINI -- -- -- --   Assessment: 41 yo c/o acute onset of SOB with chest pressure, found on CT to have bilateral PE with extensive clot burden. Pt already on lovenox 110mg  SQ q12h. To begin coumadin tonight. Baseline INR 1.04. CBC stable. This is Day #1/5 minimum overlap.   Plan:  1) Coumadin 10mg  po now 2) Coumadin educational book and video 3) Continue Lovenox 110mg  SQ Q12H and CBC q72h while on lovenox. 4) Daily INR  Ambrea Hegler, Hilario Quarry PharmD, BCPS, pager [phone] 09/03/2011,6:21 PM

## 2011-09-03 NOTE — Progress Notes (Signed)
Inpatient Diabetes Program Recommendations  AACE/ADA: New Consensus Statement on Inpatient Glycemic Control (2009)  Target Ranges:  Prepandial:   less than 140 mg/dL      Peak postprandial:   less than 180 mg/dL (1-2 hours)      Critically ill patients:  140 - 180 mg/dL   Results for CIRILO, CANNER (MRN [id number]) as of 09/03/2011 12:47  Ref. Range 09/02/2011 12:21 09/02/2011 16:32 09/02/2011 21:46 09/03/2011 07:41 09/03/2011 11:25  Glucose-Capillary Latest Range: 70-99 mg/dL 413 (H) 244 (H) 010 (H) 196 (H) 259 (H)    Inpatient Diabetes Program Recommendations Insulin - Meal Coverage: Add Novolog 4 units with meals   Elevated post-prandials may benefit from Novolog meal coverage.    Thank you  Piedad Climes [facility] Inpatient Diabetes Coordinator [phone]

## 2011-09-03 NOTE — Progress Notes (Signed)
[facility] [place] TEAM 1 - Stepdown/ICU TEAM  Subjective: Alert. Wife at the bedside. Questions answered. No chest pain but occasionally has dyspnea on exertion.  Objective: Blood pressure 121/90, pulse 79, temperature 97.7 F (36.5 C), temperature source Oral, resp. rate 18, height 5\' 6"  (1.676 m), weight 106.006 kg (233 lb 11.2 oz), SpO2 96.00%.   Intake/Output from previous day: 05/02 0701 - 05/03 0700 In: 1728 [P.O.:1680; I.V.:48] Out: 451 [Urine:450; Stool:1] Intake/Output this shift: Total I/O In: 480 [P.O.:480] Out: -   General appearance: alert, cooperative, appears stated age and no distress Resp: clear to auscultation bilaterally, 2 L nasal cannula oxygen with saturations 95% Cardio: regular rate and sinus rhythm, S1, S2 normal, no murmur, click, rub or gallop GI: soft, non-tender; bowel sounds normal; no masses,  no organomegaly Extremities: extremities normal, atraumatic, no cyanosis or edema Neurologic: Grossly normal  Lab Results:  Basename 09/03/11 0508 09/02/11 0520 09/02/11 0516  WBC 5.4 -- 7.4  HGB 15.5 17.7* --  HCT 44.8 52.0 --  PLT 130* -- 141*   BMET  Basename 09/03/11 0508 09/02/11 0520  NA 138 136  K 3.8 3.8  CL 99 98  CO2 32 --  GLUCOSE 139* 234*  BUN 16 18  CREATININE 1.19 1.10  CALCIUM 9.3 --    Studies/Results: Ct Angio Chest W/cm &/or Wo Cm  09/02/2011  *RADIOLOGY REPORT*  Clinical Data: Chest pain.  CT ANGIOGRAPHY CHEST  Technique:  Multidetector CT imaging of the chest using the standard protocol during bolus administration of intravenous contrast. Multiplanar reconstructed images including MIPs were obtained and reviewed to evaluate the vascular anatomy.  Contrast: OMNIPAQUE IOHEXOL 350 MG/ML SOLN  Comparison: 09/02/2011 radiograph  Findings: Positive for pulmonary embolism.  There are large filling defects within bilateral lower lobe and left upper branches. One of the clots within the left main pulmonary artery  extends centrally across the base of the main pulmonary artery.  Additional filling defect noted within an upper lobe segmental branch on the right. Normal caliber aorta.  Normal heart size. No CT evidence for right ventricular heart strain.  No pleural or pericardial effusion.  Limited images through the upper abdomen show no acute abnormality.  Wedge-shaped patchy opacity within the right upper lobe peripherally.  No pneumothorax.  No additional areas of consolidation.  Central airways are patent.  No acute osseous abnormality.  IMPRESSION: Bilateral pulmonary emboli with extensive clot burden as detailed above.  No CT evidence for right ventricular heart strain.  Wedge-shaped opacity within the periphery of the right upper lobe is suspicious for pulmonary infarction.  Discussed via telephone with Dr. Hyacinth Meeker at 06:00 a.m. on 09/02/2011.  Original Report Authenticated By: Waneta Martins, M.D.   Dg Chest Port 1 View  09/02/2011  *RADIOLOGY REPORT*  Clinical Data: Chest pain, shortness of breath.  PORTABLE CHEST - 1 VIEW  Comparison: None.  Findings: Aortic arch atherosclerosis.  Hypoaeration results in interstitial crowding and mild bibasilar atelectasis.  Heart size upper normal limits.  No pleural effusion or pneumothorax.   No acute osseous abnormality identified.  IMPRESSION:  Hypoaeration results in interstitial crowding and mild bibasilar atelectasis. Otherwise, no radiographic evidence for acute cardiopulmonary process.  Original Report Authenticated By: Waneta Martins, M.D.   Medications: I have reviewed the patient's current medications.  Assessment/Plan:  Bilateral Pulmonary embolism - large clot burden *First occurrence and echocardiogram shows no RV strain *Hypercoagulable panel is pending to rule out hereditary causes *Likely caused related to DVT  and recent dysmotility due to ankle injury *Pharmacy managing heparin and initiation of Coumadin *Anticipate at least 12 months oral  anticoagulation - with possible need for lifelong depending on results of hypercoag panel  Acute respiratory failure with hypoxia *Secondary to bilateral PE *Continue supportive care with oxygen and other treatments  ? 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 Yates Decamp has agreed to f/u coumadin dosing for this pt after his d/c    LOS: 1 day   Junious Silk, ANP pager [phone]  [facility] [email or web address] Password: [email or web address]  09/03/2011, 2:12 PM  I have personally examined this patient and reviewed the entire database. I have reviewed the above note, made any necessary editorial changes, and agree with its content.  Lonia Blood, MD [facility]

## 2011-09-04 DIAGNOSIS — I2699 Other pulmonary embolism without acute cor pulmonale: Secondary | ICD-10-CM

## 2011-09-04 DIAGNOSIS — I10 Essential (primary) hypertension: Secondary | ICD-10-CM

## 2011-09-04 LAB — CBC
HCT: 44.5 % (ref 39.0–52.0)
Hemoglobin: 15.3 g/dL (ref 13.0–17.0)
MCH: 25.5 pg — ABNORMAL LOW (ref 26.0–34.0)
RBC: 5.99 MIL/uL — ABNORMAL HIGH (ref 4.22–5.81)

## 2011-09-04 LAB — GLUCOSE, CAPILLARY
Glucose-Capillary: 226 mg/dL — ABNORMAL HIGH (ref 70–99)
Glucose-Capillary: 117 mg/dL — ABNORMAL HIGH (ref 70–99)
Glucose-Capillary: 217 mg/dL — ABNORMAL HIGH (ref 70–99)

## 2011-09-04 LAB — PROTIME-INR: Prothrombin Time: 13.9 seconds (ref 11.6–15.2)

## 2011-09-04 MED ORDER — WARFARIN SODIUM 10 MG PO TABS
10.0000 mg | ORAL_TABLET | Freq: Once | ORAL | Status: AC
  Administered 2011-09-04: 10 mg via ORAL
  Filled 2011-09-04: qty 1

## 2011-09-04 NOTE — Evaluation (Signed)
Physical Therapy Evaluation Patient Details Name: Scott [name] MRN: [id number] DOB: 09/26/1970 Today's Date: 09/04/2011 Time: 0800-0820 PT Time Calculation (min): 20 min  PT Assessment / Plan / Recommendation Clinical Impression  41 y/o male adm w bilateral PE.Pt also has left ankle sprain limiting his mobility.    PT Assessment  Patent does not need any further PT services    Follow Up Recommendations  No PT follow up    Equipment Recommendations  None recommended by PT    Frequency      Precautions / Restrictions Precautions Precautions: None Restrictions Weight Bearing Restrictions: No   Pertinent Vitals/Pain Pain in Left ankle 4/10      Mobility  Bed Mobility Bed Mobility: Supine to Sit Supine to Sit: 7: Independent Transfers Transfers: Sit to Stand;Stand to Sit Sit to Stand: 7: Independent Stand to Sit: 7: Independent Ambulation/Gait Ambulation/Gait Assistance: 7: Independent Ambulation Distance (Feet): 200 Feet Assistive device: None Gait Pattern: Antalgic (due to Left ankle sprain) Stairs: Yes Stairs Assistance: 7: Independent Stair Management Technique: One rail Right Number of Stairs: 5  Wheelchair Mobility Wheelchair Mobility: No    Exercises Total Joint Exercises Ankle Circles/Pumps: AROM;Both;10 reps;Seated (Added to HEP handout) Quad Sets: Both;10 reps;Seated (Added to HEP handout) Heel Slides: AROM;Both;10 reps;Supine (Added to HEP handout) Marching in Standing: Standing;Both;10 reps (Added to HEP handout) General Exercises - Lower Extremity Heel Raises: Both;10 reps;Standing (Added to HEP handout)   PT Goals Acute Rehab PT Goals PT Goal Formulation: With patient  Visit Information  Last PT Received On: 09/04/11    Subjective Data  Subjective: Do I need oxygen at home.    Prior Functioning  Home Living Lives With: Spouse Available Help at Discharge: Family Type of Home: House Home Access: Level entry Home Layout: Two  level;Bed/bath upstairs Alternate Level Stairs-Number of Steps: 12 Alternate Level Stairs-Rails: Left Bathroom Shower/Tub: Tub/shower unit;Curtain Bathroom Toilet: Standard Home Adaptive Equipment: None Prior Function Level of Independence: Independent with assistive device(s) (secondary to ankle sprain) Able to Take Stairs?: Yes Driving: Yes Vocation: Full time employment Communication Communication: No difficulties    Cognition  Overall Cognitive Status: Appears within functional limits for tasks assessed/performed Arousal/Alertness: Awake/alert Orientation Level: Appears intact for tasks assessed;Oriented X4 / Intact Behavior During Session: [facility] for tasks performed    Extremity/Trunk Assessment Right Upper Extremity Assessment RUE ROM/Strength/Tone: Within functional levels Left Upper Extremity Assessment LUE ROM/Strength/Tone: Within functional levels Right Lower Extremity Assessment RLE ROM/Strength/Tone: Within functional levels Left Lower Extremity Assessment LLE ROM/Strength/Tone: Within functional levels Trunk Assessment Trunk Assessment: Normal   Balance Balance Balance Assessed: No  End of Session PT - End of Session Equipment Utilized During Treatment: Gait belt Activity Tolerance: Patient tolerated treatment well Patient left: in chair;with call bell/phone within reach Nurse Communication: Mobility status   Donovan Persley 09/04/2011, 3:22 PM Kirra Verga L. Mansa Willers DPT [phone]

## 2011-09-04 NOTE — Progress Notes (Signed)
ANTICOAGULATION CONSULT NOTE - Follow Up Consult  Pharmacy Consult for Lovenox/Coumadin Day #1/5 overlap Indication: pulmonary embolus new large B/L  No Known Allergies  Patient Measurements: Height: 5\' 6"  (167.6 cm) Weight: 233 lb 11.2 oz (106.006 kg) IBW/kg (Calculated) : 63.8    Vital Signs: Temp: 98.1 F (36.7 C) (05/04 0600) BP: 108/73 mmHg (05/04 0600) Pulse Rate: 72  (05/04 0600)  Labs:  Basename 09/04/11 0530 09/03/11 0508 09/02/11 0615 09/02/11 0520 09/02/11 0516  HGB 15.3 15.5 -- -- --  HCT 44.5 44.8 -- 52.0 --  PLT 154 130* -- -- 141*  APTT -- -- 30 -- --  LABPROT 13.9 -- 13.8 -- --  INR 1.05 -- 1.04 -- --  HEPARINUNFRC -- -- -- -- --  CREATININE -- 1.19 -- 1.10 --  CKTOTAL -- -- -- -- --  CKMB -- -- -- -- --  TROPONINI -- -- -- -- --   Estimated Creatinine Clearance: 92.3 ml/min (by C-G formula based on Cr of 1.19).  Assessment: 41 yo c/o acute onset of SOB with chest pressure, found on CT to have bilateral PE with extensive clot burden. Started on lovenox 110mg  SQ q12h and Coumadin. INR 1.05. CBC stable, no bleeding noted, VSS. DM on SSI here home metformin held on admit Cr ok 1.19, CBG elevated 200's -consider restarting metformin. Home lisinopril held on admit BP fluctuating 108-132/70   Goal of Therapy:  INR 2-3   Plan:  Lovenox 1mg /kgq12 = 110mg  q12 Coumadin 10mg  again today Daily INR  Marcelino Scot 09/04/2011,9:06 AM
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
## 2011-09-05 DIAGNOSIS — I10 Essential (primary) hypertension: Secondary | ICD-10-CM

## 2011-09-05 DIAGNOSIS — I2699 Other pulmonary embolism without acute cor pulmonale: Secondary | ICD-10-CM

## 2011-09-05 LAB — PROTIME-INR: INR: 1.22 (ref 0.00–1.49)

## 2011-09-05 LAB — CBC
HCT: 42.6 % (ref 39.0–52.0)
Hemoglobin: 14.7 g/dL (ref 13.0–17.0)
MCHC: 34.5 g/dL (ref 30.0–36.0)
MCV: 74.1 fL — ABNORMAL LOW (ref 78.0–100.0)

## 2011-09-05 LAB — GLUCOSE, CAPILLARY: Glucose-Capillary: 142 mg/dL — ABNORMAL HIGH (ref 70–99)

## 2011-09-05 MED ORDER — WARFARIN SODIUM 10 MG PO TABS
12.5000 mg | ORAL_TABLET | Freq: Once | ORAL | Status: DC
  Filled 2011-09-05: qty 1

## 2011-09-05 MED ORDER — WARFARIN SODIUM 2.5 MG PO TABS: 12.5000 mg | ORAL_TABLET | Freq: Once | ORAL | Status: DC

## 2011-09-05 MED ORDER — ENOXAPARIN SODIUM 100 MG/ML ~~LOC~~ SOLN: 110.0000 mg | Freq: Two times a day (BID) | SUBCUTANEOUS | Status: DC

## 2011-09-05 MED ORDER — ENOXAPARIN (LOVENOX) PATIENT EDUCATION KIT
PACK | Freq: Once | Status: AC
  Administered 2011-09-05: 12:00:00
  Filled 2011-09-05: qty 1

## 2011-09-05 MED ORDER — METOPROLOL SUCCINATE ER 25 MG PO TB24: 12.5000 mg | ORAL_TABLET | Freq: Every day | ORAL | Status: DC

## 2011-09-05 MED ORDER — HYDROCODONE-ACETAMINOPHEN 5-325 MG PO TABS
1.0000 | ORAL_TABLET | ORAL | Status: AC | PRN
Start: 2011-09-05 — End: 2011-09-15

## 2011-09-05 MED ORDER — ENOXAPARIN (LOVENOX) PATIENT EDUCATION KIT: PACK | Status: DC

## 2011-09-05 NOTE — Progress Notes (Signed)
Pt ambulated to other end of the hall (to Room# 3714) and back.  On Room air, O2 sats 92-96%.  Tolerated well.

## 2011-09-05 NOTE — Progress Notes (Signed)
ANTICOAGULATION CONSULT NOTE - Follow Up Consult  Pharmacy Consult for Lovenox/Coumadin Day #1/5 overlap Indication: pulmonary embolus new large B/L  No Known Allergies  Patient Measurements: Height: 5\' 6"  (167.6 cm) Weight: 233 lb 11.2 oz (106.006 kg) IBW/kg (Calculated) : 63.8    Vital Signs: Temp: 97.8 F (36.6 C) (05/05 0530) BP: 104/66 mmHg (05/05 0530) Pulse Rate: 66  (05/05 0530)  Labs:  Basename 09/05/11 0605 09/04/11 0530 09/03/11 0508  HGB 14.7 15.3 --  HCT 42.6 44.5 44.8  PLT 161 154 130*  APTT -- -- --  LABPROT 15.7* 13.9 --  INR 1.22 1.05 --  HEPARINUNFRC -- -- --  CREATININE -- -- 1.19  CKTOTAL -- -- --  CKMB -- -- --  TROPONINI -- -- --   Estimated Creatinine Clearance: 92.3 ml/min (by C-G formula based on Cr of 1.19).  Assessment: 41 yo c/o acute onset of SOB with chest pressure, found on CT to have bilateral PE with extensive clot burden. Started on lovenox 110mg  SQ q12h and Coumadin. INR 1.22. CBC stable, no bleeding noted, VSS. DM on SSI here home metformin held on admit Cr ok 1.19, CBG elevated 200's -consider restarting metformin. Home lisinopril held on admit BP fluctuating 108-132/70   Goal of Therapy:  INR 2-3   Plan:  Lovenox 1mg /kgq12 = 110mg  q12 Coumadin 12.5mg   today Daily INR  Marcelino Scot 09/05/2011,9:25 AM
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
## 2011-09-06 ENCOUNTER — Other Ambulatory Visit: Payer: Self-pay | Admitting: Family Medicine

## 2011-09-06 DIAGNOSIS — M25572 Pain in left ankle and joints of left foot: Secondary | ICD-10-CM

## 2011-09-06 NOTE — Progress Notes (Signed)
   CARE MANAGEMENT NOTE 09/06/2011  Patient:  Scott [name], Scott [name]   Account Number:  [id number]  Date Initiated:  09/02/2011  Documentation initiated by:  Junius Creamer  Subjective/Objective Assessment:   adm w pul embolus     Action/Plan:   lives w wife   Anticipated DC Date:  09/05/2011   Anticipated DC Plan:  HOME W HOME HEALTH SERVICES      DC Planning Services  CM consult      Choice offered to / List presented to:             Status of service:  Completed, signed off Medicare Important Message given?   (If response is "NO", the following Medicare IM given date fields will be blank) Date Medicare IM given:   Date Additional Medicare IM given:    Discharge Disposition:  HOME/SELF CARE  Per UR Regulation:    If discussed at Long Length of Stay Meetings, dates discussed:    Comments: 09/06/2011 1145 late entry for 5/5 1030 -computer issues/system down unable to post note. Isidoro Donning RN CCM Case Mgmt phone [phone]     09/05/2011 1030 Contacted [facility] on [address] and they do have Lovenox 8 syringes and will order additional Lovenox. Made patient aware. Isidoro Donning RN CCM Case Mgmt phone [phone]  pcp: dr Earvin Hansen hill 5/3 15:41p per cm sec lovenox brand name not covered and generic covered under tier 2 at copay of 35.00. debbie dowell rn,bsn 09/02/11/16:34p cm sec checked, has 35.00 copay for med but only covers 42 pills in 23 days, may need prior auth at [phone]. will work on this. 09-02-11 having cm sec ck on copay for xarelto. debbie dowell rn,bsn [id number]

## 2011-09-07 LAB — BETA-2-GLYCOPROTEIN I ABS, IGG/M/A: Beta-2-Glycoprotein I IgA: 7 A Units (ref <20)

## 2011-09-07 LAB — CARDIOLIPIN ANTIBODIES, IGG, IGM, IGA
Anticardiolipin IgA: 8 APL U/mL — ABNORMAL LOW (ref <22)
Anticardiolipin IgG: 8 GPL U/mL — ABNORMAL LOW (ref <23)
Anticardiolipin IgM: 3 MPL U/mL — ABNORMAL LOW (ref <11)

## 2011-09-08 ENCOUNTER — Ambulatory Visit
Admission: RE | Admit: 2011-09-08 | Discharge: 2011-09-08 | Disposition: A | Discharge status: Home / Self Care | Payer: 59 | Source: Ambulatory Visit | Attending: Family Medicine | Admitting: Family Medicine

## 2011-09-08 DIAGNOSIS — M25572 Pain in left ankle and joints of left foot: Secondary | ICD-10-CM

## 2011-09-09 ENCOUNTER — Other Ambulatory Visit: Payer: No Typology Code available for payment source

## 2012-04-18 ENCOUNTER — Ambulatory Visit (INDEPENDENT_AMBULATORY_CARE_PROVIDER_SITE_OTHER): Payer: 59 | Admitting: Pulmonary Disease

## 2012-04-18 ENCOUNTER — Encounter: Payer: Self-pay | Admitting: Pulmonary Disease

## 2012-04-18 VITALS — BP 128/68 | HR 62 | Temp 98.8°F | Ht 66.0 in | Wt 242.0 lb

## 2012-04-18 DIAGNOSIS — G4733 Obstructive sleep apnea (adult) (pediatric): Secondary | ICD-10-CM

## 2012-04-18 NOTE — Patient Instructions (Signed)
Will arrange for sleep study Will call to arrange for follow up after sleep study reviewed 

## 2012-04-18 NOTE — Progress Notes (Signed)
Chief Complaint  Patient presents with  . sleep consult    Referred by Dr Nadara Eaton - Diff falling asleep and staying asleep - increase fatigue during the day - Has not had sleep study    CC: Scott [name]  History of Present Illness: Scott [name] is a 41 y.o. male for evaluation of sleep apnea.   He is followed by cardiology for his hypertension.  There was concern about his snoring, and sleep pattern.  He was therefore referred to pulmonary/sleep medicine.  He goes to bed at 1 am.  He was using a muscle relaxer to help him sleep, but this made him groggy during the day.  He has not used this for the past 4 days.  He wakes up once or twice to use the bathroom.  He gets up at 630 to help his kids go to school.  He goes back to sleep, and then wakes up at 9 am.  He feels tired in the mornings.  He feels tired all day.  He will take a nap after picking his kids up from school.  He works in [employer], [employer].  He has a flexible work schedule.  He does a fair amount of driving with work, but has not noticed trouble with his driving.  He does snore, and has trouble sleeping on his back.  He is not using anything to help him stay awake.  His Epworth score is 4 out 24.  The patient denies sleep walking, sleep talking, bruxism, or nightmares.  There is no history of restless legs.  The patient denies sleep hallucinations, sleep paralysis, or cataplexy.   Tests: CT chest 09/02/11>>b/l PE with RUL infarct Echo 09/02/11>>mod LVH, EF 50 to 55%, grade 1 diastolic dysfx  Past Medical History  Diagnosis Date  . Diabetes mellitus   . Hypertension   . DVT (deep venous thrombosis)     Past Surgical History  Procedure Date  . Rotator cuff repair     Current Outpatient Prescriptions on File Prior to Visit  Medication Sig Dispense Refill  . carvedilol (COREG) 6.25 MG tablet Take 6.25 mg by mouth 2 (two) times daily with a meal.      . metFORMIN (GLUCOPHAGE) 500 MG tablet Take 500 mg by mouth 2 (two)  times daily with a meal.      . simvastatin (ZOCOR) 20 MG tablet Take 20 mg by mouth every evening.        No Known Allergies  No family history on file.  History  Substance Use Topics  . Smoking status: Never Smoker   . Smokeless tobacco: Never Used  . Alcohol Use: No    Review of Systems  Constitutional: Positive for fatigue. Negative for fever, chills, diaphoresis, activity change, appetite change and unexpected weight change.  HENT: Negative for hearing loss, ear pain, nosebleeds, congestion, sore throat, facial swelling, rhinorrhea, sneezing, mouth sores, trouble swallowing, neck pain, neck stiffness, dental problem, voice change, postnasal drip, sinus pressure, tinnitus and ear discharge.   Eyes: Negative for photophobia, discharge, itching and visual disturbance.  Respiratory: Negative for apnea, cough, choking, chest tightness, shortness of breath, wheezing and stridor.   Cardiovascular: Negative for chest pain, palpitations and leg swelling.  Gastrointestinal: Negative for nausea, vomiting, abdominal pain, constipation, blood in stool and abdominal distention.  Genitourinary: Negative for dysuria, urgency, frequency, hematuria, flank pain, decreased urine volume and difficulty urinating.  Musculoskeletal: Positive for back pain. Negative for myalgias, joint swelling, arthralgias and gait problem.  Skin:  Negative for color change, pallor and rash.  Neurological: Positive for headaches. Negative for dizziness, tremors, seizures, syncope, speech difficulty, weakness, light-headedness and numbness.  Hematological: Negative for adenopathy. Does not bruise/bleed easily.  Psychiatric/Behavioral: Positive for sleep disturbance. Negative for confusion and agitation. The patient is not nervous/anxious.    Physical Exam: Filed Vitals:   04/18/12 1207  BP: 128/68  Pulse: 62  Temp: 98.8 F (37.1 C)  TempSrc: Oral  Height: 5\' 6"  (1.676 m)  Weight: 242 lb (109.77 kg)  SpO2: 99%     Wt Readings from Last 3 Encounters:  04/18/12 242 lb (109.77 kg)  09/02/11 233 lb 11.2 oz (106.006 kg)    Body mass index is 39.06 kg/(m^2).   General - No distress ENT - No sinus tenderness, MP 4, 2+ tonsils, no oral exudate, no LAN, no thyromegaly, TM clear, pupils equal/reactive Cardiac - s1s2 regular, no murmur, pulses symmetric Chest - No wheeze/rales/dullness, good air entry, normal respiratory excursion Back - No focal tenderness Abd - Soft, non-tender, no organomegaly, + bowel sounds Ext - No edema Neuro - Normal strength, cranial nerves intact Skin - No rashes Psych - Normal mood, and behavior.    Lab Results  Component Value Date   WBC 5.1 09/05/2011   HGB 14.7 09/05/2011   HCT 42.6 09/05/2011   MCV 74.1* 09/05/2011   PLT 161 09/05/2011    Lab Results  Component Value Date   CREATININE 1.19 09/03/2011   BUN 16 09/03/2011   NA 138 09/03/2011   K 3.8 09/03/2011   CL 99 09/03/2011   CO2 32 09/03/2011    Lab Results  Component Value Date   ALT 33 04/17/2007   AST 22 04/17/2007   ALKPHOS 39 04/17/2007   BILITOT 1.0 04/17/2007    Assessment/Plan:  Coralyn Helling, MD [facility] Pager:  [phone] 04/18/2012, 12:12 PM

## 2012-04-18 NOTE — Progress Notes (Deleted)
  Subjective:    Patient ID: Scott [name], male    DOB: 1970-06-07, 41 y.o.   MRN: [id number]  HPI    Review of Systems  Constitutional: Positive for fatigue. Negative for fever, chills, diaphoresis, activity change, appetite change and unexpected weight change.  HENT: Negative for hearing loss, ear pain, nosebleeds, congestion, sore throat, facial swelling, rhinorrhea, sneezing, mouth sores, trouble swallowing, neck pain, neck stiffness, dental problem, voice change, postnasal drip, sinus pressure, tinnitus and ear discharge.   Eyes: Negative for photophobia, discharge, itching and visual disturbance.  Respiratory: Negative for apnea, cough, choking, chest tightness, shortness of breath, wheezing and stridor.   Cardiovascular: Negative for chest pain, palpitations and leg swelling.  Gastrointestinal: Negative for nausea, vomiting, abdominal pain, constipation, blood in stool and abdominal distention.  Genitourinary: Negative for dysuria, urgency, frequency, hematuria, flank pain, decreased urine volume and difficulty urinating.  Musculoskeletal: Positive for back pain. Negative for myalgias, joint swelling, arthralgias and gait problem.  Skin: Negative for color change, pallor and rash.  Neurological: Positive for headaches. Negative for dizziness, tremors, seizures, syncope, speech difficulty, weakness, light-headedness and numbness.  Hematological: Negative for adenopathy. Does not bruise/bleed easily.  Psychiatric/Behavioral: Positive for sleep disturbance. Negative for confusion and agitation. The patient is not nervous/anxious.        Objective:   Physical Exam        Assessment & Plan:

## 2012-04-18 NOTE — Assessment & Plan Note (Signed)
He reports snoring, sleep disruption, and daytime sleepiness.  He has history of hypertension, and diabetes.  I am concerned he could have sleep apnea.  I have explained how sleep apnea can affect the patient's health.  Driving precautions and importance of weight loss were discussed.  Treatment options for sleep apnea were reviewed.  To further assess will arrange for in lab sleep study.

## 2012-04-19 ENCOUNTER — Ambulatory Visit
Admission: RE | Admit: 2012-04-19 | Discharge: 2012-04-19 | Disposition: A | Discharge status: Home / Self Care | Payer: 59 | Source: Ambulatory Visit | Attending: Family Medicine | Admitting: Family Medicine

## 2012-04-19 ENCOUNTER — Other Ambulatory Visit: Payer: Self-pay | Admitting: Family Medicine

## 2012-04-19 DIAGNOSIS — M549 Dorsalgia, unspecified: Secondary | ICD-10-CM

## 2012-05-16 ENCOUNTER — Ambulatory Visit (HOSPITAL_BASED_OUTPATIENT_CLINIC_OR_DEPARTMENT_OTHER): Payer: 59 | Attending: Pulmonary Disease | Admitting: Radiology

## 2012-05-16 VITALS — Ht 66.0 in | Wt 238.0 lb

## 2012-05-16 DIAGNOSIS — I10 Essential (primary) hypertension: Secondary | ICD-10-CM | POA: Insufficient documentation

## 2012-05-16 DIAGNOSIS — E119 Type 2 diabetes mellitus without complications: Secondary | ICD-10-CM | POA: Insufficient documentation

## 2012-05-16 DIAGNOSIS — G4733 Obstructive sleep apnea (adult) (pediatric): Secondary | ICD-10-CM

## 2012-05-16 DIAGNOSIS — Z79899 Other long term (current) drug therapy: Secondary | ICD-10-CM | POA: Insufficient documentation

## 2012-05-16 DIAGNOSIS — Z7901 Long term (current) use of anticoagulants: Secondary | ICD-10-CM | POA: Insufficient documentation

## 2012-05-22 ENCOUNTER — Encounter (HOSPITAL_BASED_OUTPATIENT_CLINIC_OR_DEPARTMENT_OTHER): Payer: 59

## 2012-05-25 DIAGNOSIS — G4733 Obstructive sleep apnea (adult) (pediatric): Secondary | ICD-10-CM

## 2012-05-26 NOTE — Procedures (Signed)
NAME:  Scott [name], WAKELEY NO.:  [id number]  MEDICAL RECORD NO.:  [id number]          PATIENT TYPE:  OUT  LOCATION:  SLEEP CENTER                 FACILITY:  [facility]  PHYSICIAN:  Coralyn Helling, MD        DATE OF BIRTH:  Mar 07, 1971  DATE OF STUDY:  05/16/2012                           NOCTURNAL POLYSOMNOGRAM  REFERRING PHYSICIAN:  Coralyn Helling, MD  FACILITY:  [facility].  REFERRING PHYSICIAN:  Coralyn Helling, MD  INDICATION:  [name] is a 42 year old male, who has a history of hypertension and diabetes.  He also reports snoring, sleep disruption, and daytime sleepiness.  He is referred to the sleep lab for evaluation of hypersomnia with obstructive sleep apnea.  Height is 5 feet 6 inches, weight is 238 pounds, BMI is 38, neck size is 16.5 inches.  MEDICATION:  Coreg, lisinopril, metformin, simvastatin, warfarin.  EPWORTH SLEEPINESS SCORE:  5.  SLEEP ARCHITECTURE:  Total recording time was 372 minutes, total sleep time was 267 minutes.  Sleep efficiency was 71%.  Sleep latency was 11 minutes.  REM latency was 247 minutes.  The patient was observed in all stages of sleep and slept predominantly in the nonsupine position.  CARDIAC DATA:  The average heart rate was 77 and the rhythm strip showed sinus rhythm.  RESPIRATORY DATA:  The average respiratory rate was 16.  Loud snoring was noted by the technician.  The overall apnea/hypopnea index was 15.3. There was 1 central apneic event.  The remainder of the events were obstructive in nature.  The patient was tried on CPAP.  However, he complained of feeling anxious and panicked and as a result requested that CPAP machine be removed after a trial of approximately 25 minutes.  OXYGEN DATA:  The baseline oxygenation was 98%.  The oxygen saturation nadir was 87%.  The study was conducted without the use of supplemental oxygen.  MOVEMENT/PARASOMNIA:  The periodic limb movement index was 0.  The patient had 1  restroom trip.  IMPRESSION:  This study shows evidence for mild obstructive sleep apnea with an apnea/hypopnea index of 15.3 and oxygen saturation nadir of 87%.  He had difficulty with trial of CPAP and then 2nd half of the sleep study.   In addition to diet, exercise, and weight reduction, additional therapeutic interventions could be repeat trial of CPAP versus oral appliance, or surgical intervention.     Coralyn Helling, MD Diplomat, American Board of Sleep Medicine    VS/MEDQ  D:  05/25/2012 15:20:26  T:  05/26/2012 02:04:37  Job:  [id number]

## 2012-05-29 ENCOUNTER — Telehealth: Payer: Self-pay | Admitting: Pulmonary Disease

## 2012-05-29 NOTE — Telephone Encounter (Signed)
Spoke with patient, patient requesting results of sleep study done 05/16/12.  Dr. Craige Cotta please advise. Thank You  Appears results are in "Notes tab"

## 2012-05-29 NOTE — Telephone Encounter (Signed)
LMTCB x 1 

## 2012-05-29 NOTE — Telephone Encounter (Signed)
PSG 05/16/12 >> AHI 15.3, SpO2 low 87%.  Please inform pt that he has mild to moderate sleep apnea, and will need office visit to discuss treatment options in more detail.  Please schedule for next available ROV.

## 2012-05-30 NOTE — Telephone Encounter (Signed)
Pt aware and is scheduled to see VS 06/13/12. Nothing further was needed

## 2012-06-13 ENCOUNTER — Ambulatory Visit (INDEPENDENT_AMBULATORY_CARE_PROVIDER_SITE_OTHER): Payer: 59 | Admitting: Pulmonary Disease

## 2012-06-13 ENCOUNTER — Encounter: Payer: Self-pay | Admitting: Pulmonary Disease

## 2012-06-13 VITALS — BP 120/82 | HR 69 | Temp 98.0°F | Ht 66.0 in | Wt 244.6 lb

## 2012-06-13 DIAGNOSIS — G4733 Obstructive sleep apnea (adult) (pediatric): Secondary | ICD-10-CM

## 2012-06-13 NOTE — Patient Instructions (Signed)
Will arrange for CPAP set up Can look up CPAP masks for [employer], [employer] or [employer] on-line Follow up in 8 weeks

## 2012-06-13 NOTE — Progress Notes (Signed)
Chief Complaint  Patient presents with  . Follow-up    discuss sleep study results.     CC: Scott [name]  History of Present Illness: Scott [name] is a 42 y.o. male for evaluation of sleep apnea.   He is here to review his sleep study from January.   Tests: CT chest 09/02/11>>b/l PE with RUL infarct Echo 09/02/11>>mod LVH, EF 50 to 55%, grade 1 diastolic dysfx PSG 05/16/12 >> AHI 15.3, SpO2 low 87%.  Past Medical History  Diagnosis Date  . Diabetes mellitus   . Hypertension   . DVT (deep venous thrombosis)     Past Surgical History  Procedure Laterality Date  . Rotator cuff repair      Current Outpatient Prescriptions on File Prior to Visit  Medication Sig Dispense Refill  . carvedilol (COREG) 6.25 MG tablet Take 6.25 mg by mouth 2 (two) times daily with a meal.      . lisinopril (PRINIVIL,ZESTRIL) 10 MG tablet Take 10 mg by mouth daily.      . metFORMIN (GLUCOPHAGE) 500 MG tablet Take 500 mg by mouth 2 (two) times daily with a meal.      . simvastatin (ZOCOR) 20 MG tablet Take 20 mg by mouth every evening.      . warfarin (COUMADIN) 10 MG tablet Take 10 mg by mouth daily.       No current facility-administered medications on file prior to visit.    No Known Allergies   Physical Exam: Filed Vitals:   06/13/12 1334  BP: 120/82  Pulse: 69  Temp: 98 F (36.7 C)  TempSrc: Oral  Height: 5\' 6"  (1.676 m)  Weight: 244 lb 9.6 oz (110.95 kg)  SpO2: 98%    Wt Readings from Last 3 Encounters:  06/13/12 244 lb 9.6 oz (110.95 kg)  05/16/12 238 lb (107.956 kg)  04/18/12 242 lb (109.77 kg)    Body mass index is 39.5 kg/(m^2).   General - No distress ENT - No sinus tenderness, MP 4, 2+ tonsils, no oral exudate, no LAN  Cardiac - s1s2 regular, no murmur Chest - No wheeze/rales/dullness, good air entry, normal respiratory excursion Back - No focal tenderness Abd - Soft, non-tender Ext - No edema Neuro - Normal strength Skin - No rashes Psych - Normal mood, and  behavior  Assessment/Plan:  Coralyn Helling, MD [facility] Pager:  [phone] 06/13/2012, 1:42 PM

## 2012-06-13 NOTE — Assessment & Plan Note (Signed)
He has mild to moderate OSA.  I have reviewed his sleep test results with the patient.  Explained how sleep apnea can affect the patient's health.  Driving precautions and importance of weight loss were discussed.  Treatment options for sleep apnea were reviewed.  Will arrange for auto CPAP set up.

## 2012-08-16 ENCOUNTER — Telehealth: Payer: Self-pay | Admitting: Pulmonary Disease

## 2012-08-16 NOTE — Telephone Encounter (Signed)
Attempted to call pt x's 3 to make next ov per recall.  PT never returned calls.  Mailed recall letter 08/16/12. Emily E McAlister °

## 2012-09-21 ENCOUNTER — Encounter: Payer: 59 | Attending: Cardiology | Admitting: Dietician

## 2012-09-21 ENCOUNTER — Encounter: Payer: Self-pay | Admitting: Dietician

## 2012-09-21 VITALS — Ht 66.0 in | Wt 240.6 lb

## 2012-09-21 DIAGNOSIS — Z713 Dietary counseling and surveillance: Secondary | ICD-10-CM | POA: Insufficient documentation

## 2012-09-21 DIAGNOSIS — E119 Type 2 diabetes mellitus without complications: Secondary | ICD-10-CM

## 2012-09-21 NOTE — Progress Notes (Signed)
  Patient was seen on 09/21/2012 for the first of a series of three diabetes self-management courses at the [facility]. The following learning objectives were met by the patient during this course:  Ht: 66 in  WT: 240.6 lb   A1C: 8.9% (08/2012)   Defines the role of glucose and insulin  Identifies type of diabetes and pathophysiology  Defines the diagnostic criteria for diabetes and prediabetes  States the risk factors for Type 2 Diabetes  States the symptoms of Type 2 Diabetes  Defines Type 2 Diabetes treatment goals  Defines Type 2 Diabetes treatment options  States the rationale for glucose monitoring  Identifies A1C, glucose targets, and testing times  Identifies proper sharps disposal  Defines the purpose of a diabetes food plan  Identifies carbohydrate food groups  Defines effects of carbohydrate foods on glucose levels  Identifies carbohydrate choices/grams/food labels  States benefits of physical activity and effect on glucose  Review of suggested activity guidelines  Handouts given during class include:  Type 2 Diabetes: Basics Book  My Food Plan Book  Food and Activity Log   Follow-Up Plan: Will check with his insurance company to see if they will cover his attending Core Class 2 & 3

## 2012-10-27 ENCOUNTER — Encounter: Payer: 59 | Attending: Cardiology

## 2012-10-27 DIAGNOSIS — Z713 Dietary counseling and surveillance: Secondary | ICD-10-CM | POA: Insufficient documentation

## 2012-10-27 DIAGNOSIS — E119 Type 2 diabetes mellitus without complications: Secondary | ICD-10-CM | POA: Insufficient documentation

## 2012-11-27 ENCOUNTER — Ambulatory Visit
Admission: RE | Admit: 2012-11-27 | Discharge: 2012-11-27 | Disposition: A | Discharge status: Home / Self Care | Payer: 59 | Source: Ambulatory Visit | Attending: Family Medicine | Admitting: Family Medicine

## 2012-11-27 ENCOUNTER — Other Ambulatory Visit: Payer: Self-pay | Admitting: Family Medicine

## 2012-11-27 DIAGNOSIS — M25511 Pain in right shoulder: Secondary | ICD-10-CM

## 2012-12-19 ENCOUNTER — Other Ambulatory Visit: Payer: Self-pay | Admitting: Orthopedic Surgery

## 2012-12-19 DIAGNOSIS — M25511 Pain in right shoulder: Secondary | ICD-10-CM

## 2013-01-02 ENCOUNTER — Ambulatory Visit
Admission: RE | Admit: 2013-01-02 | Discharge: 2013-01-02 | Disposition: A | Discharge status: Home / Self Care | Payer: 59 | Source: Ambulatory Visit | Attending: Orthopedic Surgery | Admitting: Orthopedic Surgery

## 2013-01-02 DIAGNOSIS — M25511 Pain in right shoulder: Secondary | ICD-10-CM

## 2013-01-02 MED ORDER — IOHEXOL 180 MG/ML  SOLN
15.0000 mL | Freq: Once | INTRAMUSCULAR | Status: AC | PRN
  Administered 2013-01-02: 15 mL via INTRA_ARTICULAR

## 2013-01-04 ENCOUNTER — Other Ambulatory Visit: Payer: Self-pay | Admitting: Orthopedic Surgery

## 2013-01-04 DIAGNOSIS — M542 Cervicalgia: Secondary | ICD-10-CM

## 2013-01-12 ENCOUNTER — Ambulatory Visit
Admission: RE | Admit: 2013-01-12 | Discharge: 2013-01-12 | Disposition: A | Discharge status: Home / Self Care | Payer: 59 | Source: Ambulatory Visit | Attending: Orthopedic Surgery | Admitting: Orthopedic Surgery

## 2013-01-12 DIAGNOSIS — M542 Cervicalgia: Secondary | ICD-10-CM

## 2013-04-12 ENCOUNTER — Other Ambulatory Visit: Payer: Self-pay | Admitting: Physical Medicine and Rehabilitation

## 2013-04-12 DIAGNOSIS — M545 Low back pain, unspecified: Secondary | ICD-10-CM

## 2013-04-19 ENCOUNTER — Other Ambulatory Visit: Payer: 59

## 2013-05-01 ENCOUNTER — Ambulatory Visit
Admission: RE | Admit: 2013-05-01 | Discharge: 2013-05-01 | Disposition: A | Discharge status: Home / Self Care | Payer: 59 | Source: Ambulatory Visit | Attending: Physical Medicine and Rehabilitation | Admitting: Physical Medicine and Rehabilitation

## 2013-05-01 DIAGNOSIS — M545 Low back pain, unspecified: Secondary | ICD-10-CM

## 2013-12-01 LAB — HM COLONOSCOPY

## 2014-01-01 ENCOUNTER — Encounter: Payer: Self-pay | Admitting: Internal Medicine

## 2014-01-01 ENCOUNTER — Ambulatory Visit: Payer: 59 | Attending: Internal Medicine | Admitting: Internal Medicine

## 2014-01-01 VITALS — BP 140/104 | HR 90 | Temp 98.0°F | Resp 16 | Ht 66.0 in | Wt 240.0 lb

## 2014-01-01 DIAGNOSIS — L918 Other hypertrophic disorders of the skin: Secondary | ICD-10-CM

## 2014-01-01 DIAGNOSIS — M26609 Unspecified temporomandibular joint disorder, unspecified side: Secondary | ICD-10-CM | POA: Diagnosis not present

## 2014-01-01 DIAGNOSIS — I10 Essential (primary) hypertension: Secondary | ICD-10-CM | POA: Diagnosis not present

## 2014-01-01 DIAGNOSIS — L909 Atrophic disorder of skin, unspecified: Secondary | ICD-10-CM | POA: Diagnosis not present

## 2014-01-01 DIAGNOSIS — E119 Type 2 diabetes mellitus without complications: Secondary | ICD-10-CM | POA: Diagnosis not present

## 2014-01-01 DIAGNOSIS — L919 Hypertrophic disorder of the skin, unspecified: Secondary | ICD-10-CM | POA: Diagnosis not present

## 2014-01-01 DIAGNOSIS — J3489 Other specified disorders of nose and nasal sinuses: Secondary | ICD-10-CM | POA: Diagnosis not present

## 2014-01-01 DIAGNOSIS — Z0000 Encounter for general adult medical examination without abnormal findings: Secondary | ICD-10-CM

## 2014-01-01 DIAGNOSIS — Z23 Encounter for immunization: Secondary | ICD-10-CM

## 2014-01-01 DIAGNOSIS — R0981 Nasal congestion: Secondary | ICD-10-CM

## 2014-01-01 LAB — POCT GLYCOSYLATED HEMOGLOBIN (HGB A1C): HEMOGLOBIN A1C: 7.4

## 2014-01-01 LAB — GLUCOSE, POCT (MANUAL RESULT ENTRY): POC Glucose: 206 mg/dl — AB (ref 70–99)

## 2014-01-01 MED ORDER — LISINOPRIL 10 MG PO TABS: 10.0000 mg | ORAL_TABLET | Freq: Every day | ORAL | Status: DC

## 2014-01-01 MED ORDER — FLUTICASONE PROPIONATE 50 MCG/ACT NA SUSP: 2.0000 | Freq: Every day | NASAL | Status: DC

## 2014-01-01 MED ORDER — AMLODIPINE BESYLATE 5 MG PO TABS: 5.0000 mg | ORAL_TABLET | Freq: Every day | ORAL | Status: DC

## 2014-01-01 MED ORDER — PREDNISONE 10 MG PO TABS: 10.0000 mg | ORAL_TABLET | Freq: Every day | ORAL | Status: DC

## 2014-01-01 MED ORDER — LINAGLIPTIN-METFORMIN HCL 2.5-1000 MG PO TABS: 1.0000 | ORAL_TABLET | Freq: Two times a day (BID) | ORAL | Status: DC

## 2014-01-01 NOTE — Progress Notes (Signed)
Pt is here to establish care. Pt has a history of diabetes and HTN. Pt states that he has a sinus infection and congestion.  Pt has a mole on his left side of his face that is concerning.  Pt has pain while chewing food on his left side of his jaw near his ear.

## 2014-01-01 NOTE — Progress Notes (Addendum)
Patient ID: Scott [name], male   DOB: [birth year], 43 y.o.   MRN: [id number]  [id number]  [id number]  DOB - 1971/03/11  CC:  Chief Complaint  Patient presents with  . Establish Care       HPI: Scott [name] is a 43 y.o. male here today to establish medical care.  He has a past medical history of HTN, DM, and DVT.  He has not been on coumadin since May.  He was released to discontinue coumadin by his previous provider.  He reports that he was found to have 3 pulmonary emboli.  He is currently taking lisinopril 10 mg.  He reports that he was on amlodipine but it was discontinued and switched to lisinopril.  Today he denies chest pain, SOB, or palpitations.  Pain on left side of face/jaw with chewing that has been present for last couple of months, He reports that pain is aggravated with chewing. He states that he had this pain a few years ago that resolved on its own.  Mole on left side of face that he is concerned about. That has been present for a few months but it has changed in color and size. He would like to have it removed. He denies pain or bleeding of area.  He complains of a "sinus infection" and congestion for 3 months.  He has tried claritin and affrin  OTC.  He reports headache in occiptal region, nasal congestion, cough, and sneezing. Denies fever, chills, nausea, vomiting, or sore throat.  He has been to allergist that told him he was allergic to tree pollen, Has had some yellow nasal discharge.   Last colonoscopy was February this current year ago and was found to have polyps that were removed. He denies melena or hematochezia.  No Known Allergies Past Medical History  Diagnosis Date  . Diabetes mellitus   . Hypertension   . DVT (deep venous thrombosis)   . Obesity    Current Outpatient Prescriptions on File Prior to Visit  Medication Sig Dispense Refill  . aspirin 81 MG tablet Take 81 mg by mouth daily.      . Linagliptin-Metformin HCl (JENTADUETO) 2.08-998  MG TABS Take 1 tablet by mouth 2 (two) times daily.      [name] lisinopril (PRINIVIL,ZESTRIL) 10 MG tablet Take 10 mg by mouth daily.      . simvastatin (ZOCOR) 20 MG tablet Take 10 mg by mouth daily.       [name] amLODipine (NORVASC) 5 MG tablet Take 5 mg by mouth daily.      . carvedilol (COREG) 6.25 MG tablet Take 6.25 mg by mouth 2 (two) times daily with a meal.      . metFORMIN (GLUCOPHAGE) 500 MG tablet Take 500 mg by mouth 2 (two) times daily with a meal.      . warfarin (COUMADIN) 10 MG tablet Take 10 mg by mouth daily.       No current facility-administered medications on file prior to visit.   Family History  Problem Relation Age of Onset  . Hypertension Other   . Hyperlipidemia Other   . Diabetes Other    History   Social History  . Marital Status: Married    Spouse Name: N/A    Number of Children: N/A  . Years of Education: N/A   Occupational History  . real estate agent    Social History Main Topics  . Smoking status: Never Smoker   . Smokeless tobacco: Never Used  .  Alcohol Use: No  . Drug Use: No  . Sexual Activity: Not on file   Other Topics Concern  . Not on file   Social History Narrative  . No narrative on file   Review of Systems  Constitutional: Negative for fever and chills.  HENT: Positive for congestion. Negative for sore throat.   Eyes: Negative.   Respiratory: Positive for cough. Negative for shortness of breath and wheezing.   Cardiovascular: Negative.   Gastrointestinal: Negative.   Genitourinary: Negative.   Musculoskeletal: Negative.   Skin: Negative.   Neurological: Positive for dizziness and headaches. Tingling: of hands.  Endo/Heme/Allergies: Negative for polydipsia.  Psychiatric/Behavioral: Negative.      Objective:   Filed Vitals:   01/01/14 1513  BP: 140/104  Pulse: 90  Temp: 98 F (36.7 C)  Resp: 16   Physical Exam  Vitals reviewed. Constitutional: He is oriented to person, place, and time. No distress.  HENT:  Right  Ear: External ear normal.  Left Ear: External ear normal.  Mouth/Throat: Oropharynx is clear and moist.  Eyes: Conjunctivae and EOM are normal. Pupils are equal, round, and reactive to light.  Neck: Normal range of motion. Neck supple. No thyromegaly present.  Cardiovascular: Normal rate, regular rhythm and normal heart sounds.   Pulmonary/Chest: Effort normal and breath sounds normal.  Abdominal: Soft. Bowel sounds are normal. He exhibits no distension.  Genitourinary: Prostate normal.  Musculoskeletal: Normal range of motion. He exhibits no edema and no tenderness.  Neurological: He is alert and oriented to person, place, and time. He has normal reflexes. No cranial nerve deficit.  Skin: Skin is warm and dry. He is not diaphoretic.  Small skin tag on left side of face, close to tragus  Psychiatric: He has a normal mood and affect.   .  Lab Results  Component Value Date   WBC 5.1 09/05/2011   HGB 14.7 09/05/2011   HCT 42.6 09/05/2011   MCV 74.1* 09/05/2011   PLT 161 09/05/2011   Lab Results  Component Value Date   CREATININE 1.19 09/03/2011   BUN 16 09/03/2011   NA 138 09/03/2011   K 3.8 09/03/2011   CL 99 09/03/2011   CO2 32 09/03/2011    No results found for this basename: HGBA1C   Lipid Panel     Component Value Date/Time   CHOL 196 04/17/2007 2035   TRIG 118 04/17/2007 2035   HDL 41 04/17/2007 2035   CHOLHDL 4.8 Ratio 04/17/2007 2035   VLDL 24 04/17/2007 2035   LDLCALC 131* 04/17/2007 2035       Assessment and plan:   Scott [name] was seen today for establish care.  Diagnoses and associated orders for this visit:  Type 2 diabetes mellitus without complication - POCT glucose (manual entry) - POCT A1C - May continue Linagliptin-Metformin HCl (JENTADUETO) 2.08-998 MG TABS; Take 1 tablet by mouth 2 (two) times daily.  Essential hypertension - lisinopril (PRINIVIL,ZESTRIL) 10 MG tablet; Take 1 tablet (10 mg total) by mouth daily. - Added amLODipine (NORVASC) 5 MG tablet; Take 1  tablet (5 mg total) by mouth daily. Would like patient to stay on Simvastatin 20 mg QD due to ASCVD risk of 13.6%, due to him being AA, hypertensive, and diabetic believe it will be very beneficial to his long term health. Have educated patient on appropriate lifestyle changes.  Annual physical exam - PSA; Future - CBC; Future - COMPLETE METABOLIC PANEL WITH GFR; Future - TSH; Future - Lipid panel; Future  TMJ (  temporomandibular joint disorder) - predniSONE (DELTASONE) 10 MG tablet; Take 1 tablet (10 mg total) by mouth daily with breakfast. Do not want to use Ibuprofen due to HTN.  Skin tag - Ambulatory referral to Dermatology  Nasal congestion - Begin fluticasone (FLONASE) 50 MCG/ACT nasal spray; Place 2 sprays into both nostrils daily.    Return in about 2 weeks (around 01/15/2014) for Nurse Visit-Bp check and Lab visit. Nurse will show NP blood pressure if still elevated...may require increase of amlodipine to 10 mg QD and a follow up nurse visit.     Holland Commons, NP-C [facility] [phone] 01/01/2014, 3:31 PM

## 2014-01-01 NOTE — Patient Instructions (Addendum)
Stop Affrin nasal spray.  Please begin taking flonase 2 sprays in both nares daily. If you do not notice any improvement by nurse visit, we will look at the option of antibiotic use.  It was a pleasure to meet you.     Temporomandibular Problems  Temporomandibular joint (TMJ) dysfunction means there are problems with the joint between your jaw and your skull. This is a joint lined by cartilage like other joints in your body but also has a small disc in the joint which keeps the bones from rubbing on each other. These joints are like other joints and can get inflamed (sore) from arthritis and other problems. When this joint gets sore, it can cause headaches and pain in the jaw and the face. CAUSES  Usually the arthritic types of problems are caused by soreness in the joint. Soreness in the joint can also be caused by overuse. This may come from grinding your teeth. It may also come from mis-alignment in the joint. DIAGNOSIS Diagnosis of this condition can often be made by history and exam. Sometimes your caregiver may need X-rays or an MRI scan to determine the exact cause. It may be necessary to see your dentist to determine if your teeth and jaws are lined up correctly. TREATMENT  Most of the time this problem is not serious; however, sometimes it can persist (become chronic). When this happens medications that will cut down on inflammation (soreness) help. Sometimes a shot of cortisone into the joint will be helpful. If your teeth are not aligned it may help for your dentist to make a splint for your mouth that can help this problem. If no physical problems can be found, the problem may come from tension. If tension is found to be the cause, biofeedback or relaxation techniques may be helpful. HOME CARE INSTRUCTIONS   Later in the day, applications of ice packs may be helpful. Ice can be used in a plastic bag with a towel around it to prevent frostbite to skin. This may be used about every 2 hours  for 20 to 30 minutes, as needed while awake, or as directed by your caregiver.  Only take over-the-counter or prescription medicines for pain, discomfort, or fever as directed by your caregiver.  If physical therapy was prescribed, follow your caregiver's directions.  Wear mouth appliances as directed if they were given. Document Released: 01/12/2001 Document Revised: 07/12/2011 Document Reviewed: 04/21/2008 [facility] Patient Information 2015 [place], [place]. This information is not intended to replace advice given to you by your health care provider. Make sure you discuss any questions you have with your health care provider.

## 2014-01-03 ENCOUNTER — Telehealth: Payer: Self-pay | Admitting: Internal Medicine

## 2014-01-03 NOTE — Telephone Encounter (Signed)
During last appt pt was told that he could be prescribed an antibiotic for ear infection and pt is following up to see if that is possible as he is still experiencing ear pain/infection symptoms.  Please f/u with pt.    Also pt reports that he had a very pleasant visit during New Patient appt with very little wait time and highlighted CMA DW's care during visit.

## 2014-01-03 NOTE — Telephone Encounter (Signed)
Patient states he still having congestion and pain shooting in the left ear. Please advise

## 2014-01-04 ENCOUNTER — Other Ambulatory Visit: Payer: Self-pay | Admitting: Internal Medicine

## 2014-01-04 DIAGNOSIS — H6690 Otitis media, unspecified, unspecified ear: Secondary | ICD-10-CM

## 2014-01-04 MED ORDER — AMOXICILLIN 500 MG PO CAPS: 500.0000 mg | ORAL_CAPSULE | Freq: Three times a day (TID) | ORAL | Status: DC

## 2014-01-14 ENCOUNTER — Telehealth: Payer: Self-pay | Admitting: Internal Medicine

## 2014-01-14 NOTE — Telephone Encounter (Signed)
Pt was seen in for new patient appt on 01/01/14. Pt states he is still experiencing symptoms of allergy/congestion. Pt is scheduled to come in on 01/15/14 for a lab visit but would like to see dr regarding issue. Pls contact pt.

## 2014-01-15 ENCOUNTER — Ambulatory Visit: Payer: 59 | Attending: Internal Medicine

## 2014-01-15 ENCOUNTER — Telehealth: Payer: Self-pay | Admitting: Emergency Medicine

## 2014-01-15 VITALS — BP 136/93 | HR 71 | Resp 16

## 2014-01-15 DIAGNOSIS — I10 Essential (primary) hypertension: Secondary | ICD-10-CM | POA: Diagnosis not present

## 2014-01-15 DIAGNOSIS — Z0000 Encounter for general adult medical examination without abnormal findings: Secondary | ICD-10-CM

## 2014-01-15 LAB — LIPID PANEL
CHOLESTEROL: 159 mg/dL (ref 0–200)
HDL: 37 mg/dL — AB (ref >39)
LDL CALC: 110 mg/dL — AB (ref 0–99)
TRIGLYCERIDES: 62 mg/dL (ref <150)
Total CHOL/HDL Ratio: 4.3 Ratio
VLDL: 12 mg/dL (ref 0–40)

## 2014-01-15 LAB — CBC
HCT: 44.8 % (ref 39.0–52.0)
HEMOGLOBIN: 15.3 g/dL (ref 13.0–17.0)
MCH: 25.2 pg — AB (ref 26.0–34.0)
MCHC: 34.2 g/dL (ref 30.0–36.0)
MCV: 73.7 fL — AB (ref 78.0–100.0)
Platelets: 228 10*3/uL (ref 150–400)
RBC: 6.08 MIL/uL — AB (ref 4.22–5.81)
RDW: 15.1 % (ref 11.5–15.5)
WBC: 4.5 10*3/uL (ref 4.0–10.5)

## 2014-01-15 LAB — COMPLETE METABOLIC PANEL WITH GFR
ALK PHOS: 45 U/L (ref 39–117)
ALT: 36 U/L (ref 0–53)
AST: 25 U/L (ref 0–37)
Albumin: 4.2 g/dL (ref 3.5–5.2)
BILIRUBIN TOTAL: 0.6 mg/dL (ref 0.2–1.2)
BUN: 12 mg/dL (ref 6–23)
CO2: 25 meq/L (ref 19–32)
CREATININE: 1.04 mg/dL (ref 0.50–1.35)
Calcium: 8.9 mg/dL (ref 8.4–10.5)
Chloride: 102 mEq/L (ref 96–112)
GFR, EST NON AFRICAN AMERICAN: 87 mL/min
GLUCOSE: 156 mg/dL — AB (ref 70–99)
Potassium: 4.4 mEq/L (ref 3.5–5.3)
SODIUM: 139 meq/L (ref 135–145)
Total Protein: 6.8 g/dL (ref 6.0–8.3)

## 2014-01-15 LAB — TSH: TSH: 1.085 u[IU]/mL (ref 0.350–4.500)

## 2014-01-15 NOTE — Patient Instructions (Signed)
Today your BP is till elevated. The doctor has decided to increase the strength of your Norvasc to  everyday.

## 2014-01-15 NOTE — Telephone Encounter (Signed)
Pt called in with continuous c/o nasal congestion/allergy sx's x 1 month unrelieved by prescribed medication. Pt has completed ATB course with Flonase and no relief. Instructed pt to try otc decongestant and if no improvement by Friday, return to clinic for nurse visit.

## 2014-01-15 NOTE — Progress Notes (Signed)
Pt was added another medication for his BP. Today the diastolic is elevated. Doctor has decided to increase the Norvasc  To  QD.

## 2014-01-15 NOTE — Patient Instructions (Addendum)
Today your BP is till elevated. The doctor has decided to increase the strength of your Norvasc to  everyday. Return in 1 week to recheck BP

## 2014-01-16 LAB — PSA: PSA: 0.55 ng/mL (ref <=4.00)

## 2014-01-23 ENCOUNTER — Ambulatory Visit: Payer: 59 | Attending: Internal Medicine

## 2014-01-23 ENCOUNTER — Telehealth: Payer: Self-pay | Admitting: Internal Medicine

## 2014-01-23 MED ORDER — AMOXICILLIN-POT CLAVULANATE 875-125 MG PO TABS: 1.0000 | ORAL_TABLET | Freq: Two times a day (BID) | ORAL | Status: DC

## 2014-01-23 MED ORDER — NEOMYCIN-POLYMYXIN-HC 3.5-10000-1 OT SOLN: 2.0000 [drp] | Freq: Four times a day (QID) | OTIC | Status: DC

## 2014-01-23 NOTE — Telephone Encounter (Signed)
Patient was seen in office for bp check today (01/23/14) and forgot to speak to nurse in regards to lab results from prior visit. Please contact patient with results.

## 2014-01-23 NOTE — Patient Instructions (Signed)
Take prescribed Augmentin tablet twice a day with full completion Use prescribed Antibiotic drops in left ear 4 times/day  Return to clinic on Friday if no improvement

## 2014-01-24 ENCOUNTER — Telehealth: Payer: Self-pay | Admitting: Emergency Medicine

## 2014-01-24 MED ORDER — SIMVASTATIN 20 MG PO TABS: 10.0000 mg | ORAL_TABLET | Freq: Every day | ORAL | Status: DC

## 2014-01-24 NOTE — Telephone Encounter (Signed)
Message copied by Darlis Loan on Thu Jan 24, 2014  5:59 PM ------      Message from: Holland Commons A      Created: Mon Jan 21, 2014 11:21 PM       Please find out if this patient is taking his daily aspirin and simvastatin. Please inform him that his cholesterol has improved but it is still not at optimal levels. Please inform him that I would like him to stay on simvastatin and aspirin to help lower his risk of cardiovascular disease due to him being a diabetic, african [place], and with HTN. Please send him refills of these two if needed. Thanks. All other labs are ok ------

## 2014-01-24 NOTE — Telephone Encounter (Signed)
Pt given lab results. States she has not been taking Simvastatin but takes Aspirin daily Reordered Simvastatin 20 mg tab and e-scribed to [facility]

## 2014-01-29 ENCOUNTER — Telehealth: Payer: Self-pay | Admitting: Internal Medicine

## 2014-01-29 NOTE — Telephone Encounter (Signed)
Patient is concerned about left ear is still swelling even though he is taking Augmetin 875-125 mg. Patient refers he still has ear ache also his lymp nodes behind his back eart is still swellling. Please f/u with Patient.

## 2014-01-30 ENCOUNTER — Telehealth: Payer: Self-pay | Admitting: Emergency Medicine

## 2014-01-30 NOTE — Telephone Encounter (Signed)
Pt states the pain is getting better and swelling after calling yesterday. Instructed pt to come to clinic if sx worsens with pain tomorrow for possible ENT referral

## 2014-02-01 ENCOUNTER — Telehealth: Payer: Self-pay | Admitting: Internal Medicine

## 2014-02-01 DIAGNOSIS — N451 Epididymitis: Secondary | ICD-10-CM | POA: Diagnosis not present

## 2014-02-01 DIAGNOSIS — Z7901 Long term (current) use of anticoagulants: Secondary | ICD-10-CM | POA: Insufficient documentation

## 2014-02-01 DIAGNOSIS — Z7951 Long term (current) use of inhaled steroids: Secondary | ICD-10-CM | POA: Diagnosis not present

## 2014-02-01 DIAGNOSIS — Z792 Long term (current) use of antibiotics: Secondary | ICD-10-CM | POA: Insufficient documentation

## 2014-02-01 DIAGNOSIS — E669 Obesity, unspecified: Secondary | ICD-10-CM | POA: Insufficient documentation

## 2014-02-01 DIAGNOSIS — Z86718 Personal history of other venous thrombosis and embolism: Secondary | ICD-10-CM | POA: Insufficient documentation

## 2014-02-01 DIAGNOSIS — I10 Essential (primary) hypertension: Secondary | ICD-10-CM | POA: Insufficient documentation

## 2014-02-01 DIAGNOSIS — Z7982 Long term (current) use of aspirin: Secondary | ICD-10-CM | POA: Diagnosis not present

## 2014-02-01 DIAGNOSIS — R1909 Other intra-abdominal and pelvic swelling, mass and lump: Secondary | ICD-10-CM | POA: Diagnosis present

## 2014-02-01 DIAGNOSIS — E119 Type 2 diabetes mellitus without complications: Secondary | ICD-10-CM | POA: Insufficient documentation

## 2014-02-01 DIAGNOSIS — Z79899 Other long term (current) drug therapy: Secondary | ICD-10-CM | POA: Insufficient documentation

## 2014-02-01 NOTE — Telephone Encounter (Signed)
Patient calling to schedule appt for symptoms of headache and fever associated to earache. Offered patient the first available appt which was 02/06/14. Patient states that he needed to be seen sooner than that and that he was told he could walk in. Informed patient that we weren't offering walk-ins at the time. Also informed patient that the system noted that nurse instructed him to come in for possible ENT referral for symptoms, not walk in appt. Patient wanted to speak to nurse. Patient transferred to nurse line.

## 2014-02-02 ENCOUNTER — Encounter (HOSPITAL_COMMUNITY): Payer: Self-pay | Admitting: Emergency Medicine

## 2014-02-02 ENCOUNTER — Emergency Department (HOSPITAL_COMMUNITY): Payer: 59

## 2014-02-02 ENCOUNTER — Emergency Department (HOSPITAL_COMMUNITY)
Admission: EM | Admit: 2014-02-02 | Discharge: 2014-02-02 | Disposition: A | Discharge status: Home / Self Care | Payer: 59 | Attending: Emergency Medicine | Admitting: Emergency Medicine

## 2014-02-02 DIAGNOSIS — N451 Epididymitis: Secondary | ICD-10-CM

## 2014-02-02 LAB — COMPREHENSIVE METABOLIC PANEL
ALBUMIN: 4 g/dL (ref 3.5–5.2)
ALT: 39 U/L (ref 0–53)
ANION GAP: 10 (ref 5–15)
AST: 25 U/L (ref 0–37)
Alkaline Phosphatase: 56 U/L (ref 39–117)
BILIRUBIN TOTAL: 0.4 mg/dL (ref 0.3–1.2)
BUN: 12 mg/dL (ref 6–23)
CALCIUM: 9.1 mg/dL (ref 8.4–10.5)
CHLORIDE: 101 meq/L (ref 96–112)
CO2: 28 meq/L (ref 19–32)
CREATININE: 1.13 mg/dL (ref 0.50–1.35)
GFR calc Af Amer: 90 mL/min — ABNORMAL LOW (ref >90)
GFR, EST NON AFRICAN AMERICAN: 77 mL/min — AB (ref >90)
Glucose, Bld: 163 mg/dL — ABNORMAL HIGH (ref 70–99)
Potassium: 4.4 mEq/L (ref 3.7–5.3)
Sodium: 139 mEq/L (ref 137–147)
Total Protein: 8 g/dL (ref 6.0–8.3)

## 2014-02-02 LAB — CBC WITH DIFFERENTIAL/PLATELET
BASOS PCT: 0 % (ref 0–1)
Basophils Absolute: 0 10*3/uL (ref 0.0–0.1)
EOS PCT: 1 % (ref 0–5)
Eosinophils Absolute: 0 10*3/uL (ref 0.0–0.7)
HEMATOCRIT: 47.1 % (ref 39.0–52.0)
HEMOGLOBIN: 16.2 g/dL (ref 13.0–17.0)
Lymphocytes Relative: 51 % — ABNORMAL HIGH (ref 12–46)
Lymphs Abs: 2.5 10*3/uL (ref 0.7–4.0)
MCH: 25.8 pg — ABNORMAL LOW (ref 26.0–34.0)
MCHC: 34.4 g/dL (ref 30.0–36.0)
MCV: 75.1 fL — AB (ref 78.0–100.0)
MONO ABS: 1 10*3/uL (ref 0.1–1.0)
MONOS PCT: 19 % — AB (ref 3–12)
NEUTROS ABS: 1.4 10*3/uL — AB (ref 1.7–7.7)
Neutrophils Relative %: 29 % — ABNORMAL LOW (ref 43–77)
Platelets: 202 10*3/uL (ref 150–400)
RBC: 6.27 MIL/uL — ABNORMAL HIGH (ref 4.22–5.81)
RDW: 14 % (ref 11.5–15.5)
WBC: 5 10*3/uL (ref 4.0–10.5)

## 2014-02-02 LAB — URINE MICROSCOPIC-ADD ON

## 2014-02-02 LAB — URINALYSIS, ROUTINE W REFLEX MICROSCOPIC
Bilirubin Urine: NEGATIVE
Glucose, UA: 100 mg/dL — AB
KETONES UR: 15 mg/dL — AB
LEUKOCYTES UA: NEGATIVE
NITRITE: NEGATIVE
Specific Gravity, Urine: 1.034 — ABNORMAL HIGH (ref 1.005–1.030)
Urobilinogen, UA: 0.2 mg/dL (ref 0.0–1.0)
pH: 5.5 (ref 5.0–8.0)

## 2014-02-02 MED ORDER — OXYCODONE-ACETAMINOPHEN 5-325 MG PO TABS
1.0000 | ORAL_TABLET | Freq: Once | ORAL | Status: AC
  Administered 2014-02-02: 1 via ORAL
  Filled 2014-02-02: qty 1

## 2014-02-02 MED ORDER — OXYCODONE-ACETAMINOPHEN 5-325 MG PO TABS: 2.0000 | ORAL_TABLET | ORAL | Status: DC | PRN

## 2014-02-02 MED ORDER — CIPROFLOXACIN HCL 500 MG PO TABS: ORAL_TABLET | ORAL | Status: DC

## 2014-02-02 NOTE — ED Notes (Signed)
The pt is  Lt testicle swollen and painful fior 2 days.  No difficulty voiding.  No urinary symptoms.  Elevated temp also

## 2014-02-02 NOTE — Discharge Instructions (Signed)
Epididymitis °Epididymitis is a swelling (inflammation) of the epididymis. The epididymis is a cord-like structure along the back part of the testicle. Epididymitis is usually, but not always, caused by infection. This is usually a sudden problem beginning with chills, fever and pain behind the scrotum and in the testicle. There may be swelling and redness of the testicle. °DIAGNOSIS  °Physical examination will reveal a tender, swollen epididymis. Sometimes, cultures are obtained from the urine or from prostate secretions to help find out if there is an infection or if the cause is a different problem. Sometimes, blood work is performed to see if your white blood cell count is elevated and if a germ (bacterial) or viral infection is present. Using this knowledge, an appropriate medicine which kills germs (antibiotic) can be chosen by your caregiver. A viral infection causing epididymitis will most often go away (resolve) without treatment. °HOME CARE INSTRUCTIONS  °· Hot sitz baths for 20 minutes, 4 times per day, may help relieve pain. °· Only take over-the-counter or prescription medicines for pain, discomfort or fever as directed by your caregiver. °· Take all medicines, including antibiotics, as directed. Take the antibiotics for the full prescribed length of time even if you are feeling better. °· It is very important to keep all follow-up appointments. °SEEK IMMEDIATE MEDICAL CARE IF:  °· You have a fever. °· You have pain not relieved with medicines. °· You have any worsening of your problems. °· Your pain seems to come and go. °· You develop pain, redness, and swelling in the scrotum and surrounding areas. °MAKE SURE YOU:  °· Understand these instructions. °· Will watch your condition. °· Will get help right away if you are not doing well or get worse. °Document Released: 04/16/2000 Document Revised: 07/12/2011 Document Reviewed: 03/06/2009 °[employer]® Patient Information ©2015 [employer]. This information  is not intended to replace advice given to you by your health care provider. Make sure you discuss any questions you have with your health care provider. ° °

## 2014-02-02 NOTE — ED Notes (Signed)
MD at bedside. 

## 2014-02-02 NOTE — ED Provider Notes (Signed)
CSN: [id number]     Arrival date & time 02/01/14  2355 History   First MD Initiated Contact with Patient 02/02/14 [phone]     Chief Complaint  Patient presents with  . Groin Swelling      Patient is a 43 y.o. male presenting with testicular pain. The history is provided by the patient.  Testicle Pain This is a new problem. The current episode started 2 days ago. The problem occurs constantly. The problem has been gradually worsening. Pertinent negatives include no chest pain and no abdominal pain. Exacerbated by: palpation. The symptoms are relieved by rest.  pt reports left testicle pain/swelling for past 2 days No trauma He has never had this before No dysuria or penile discharge No abd or back pain He reports fever at home (100.3)  He reports recent treatment for otitis media and completed augmentin yesterday  Past Medical History  Diagnosis Date  . Diabetes mellitus   . Hypertension   . DVT (deep venous thrombosis)   . Obesity    Past Surgical History  Procedure Laterality Date  . Rotator cuff repair     Family History  Problem Relation Age of Onset  . Hypertension Other   . Hyperlipidemia Other   . Diabetes Other    History  Substance Use Topics  . Smoking status: Never Smoker   . Smokeless tobacco: Never Used  . Alcohol Use: No    Review of Systems  Constitutional: Positive for fever.  Cardiovascular: Negative for chest pain.  Gastrointestinal: Negative for abdominal pain.  Genitourinary: Positive for scrotal swelling and testicular pain. Negative for dysuria.  Neurological: Negative for weakness.  All other systems reviewed and are negative.     Allergies  Review of patient's allergies indicates no known allergies.  Home Medications   Prior to Admission medications   Medication Sig Start Date End Date Taking? Authorizing Provider  amLODipine (NORVASC) 5 MG tablet Take 1 tablet (5 mg total) by mouth daily. 01/01/14   Ambrose Finland, NP  amoxicillin  (AMOXIL) 500 MG capsule Take 1 capsule (500 mg total) by mouth 3 (three) times daily. 01/04/14   Ambrose Finland, NP  amoxicillin-clavulanate (AUGMENTIN) 875-125 MG per tablet Take 1 tablet by mouth 2 (two) times daily. 01/23/14   Quentin Angst, MD  aspirin 81 MG tablet Take 81 mg by mouth daily.    Historical Provider, MD  carvedilol (COREG) 6.25 MG tablet Take 6.25 mg by mouth 2 (two) times daily with a meal.    Historical Provider, MD  fluticasone (FLONASE) 50 MCG/ACT nasal spray Place 2 sprays into both nostrils daily. 01/01/14   Ambrose Finland, NP  Linagliptin-Metformin HCl (JENTADUETO) 2.08-998 MG TABS Take 1 tablet by mouth 2 (two) times daily. 01/01/14   Ambrose Finland, NP  lisinopril (PRINIVIL,ZESTRIL) 10 MG tablet Take 1 tablet (10 mg total) by mouth daily. 01/01/14   Ambrose Finland, NP  metFORMIN (GLUCOPHAGE) 500 MG tablet Take 500 mg by mouth 2 (two) times daily with a meal.    Historical Provider, MD  neomycin-polymyxin-hydrocortisone (CORTISPORIN) otic solution Place 2 drops into the left ear 4 (four) times daily. 01/23/14   Quentin Angst, MD  predniSONE (DELTASONE) 10 MG tablet Take 1 tablet (10 mg total) by mouth daily with breakfast. 01/01/14   Ambrose Finland, NP  simvastatin (ZOCOR) 20 MG tablet Take 0.5 tablets (10 mg total) by mouth daily. 01/24/14   Ambrose Finland, NP  warfarin (COUMADIN) 10  MG tablet Take 10 mg by mouth daily.    Historical Provider, MD   BP 164/94  Pulse 82  Temp(Src) 98.2 F (36.8 C) (Oral)  Resp 24  SpO2 99% Physical Exam CONSTITUTIONAL: Well developed/well nourished HEAD: Normocephalic/atraumatic EYES: EOMI/PERRL ENMT: Mucous membranes moist NECK: supple no meningeal signs SPINE:entire spine nontender CV: S1/S2 noted, no murmurs/rubs/gallops noted LUNGS: Lungs are clear to auscultation bilaterally, no apparent distress ABDOMEN: soft, nontender, no rebound or guarding GU:no cva tenderness Left testicle - tenderness noted.  The testicle is  enlarged.  No overlying erythema/crepitus noted.  No abscess noted. No hernia is noted.  No inguinal lymphadenopathy.  Chaperone present for exam NEURO: Pt is awake/alert, moves all extremitiesx4 EXTREMITIES: pulses normal, full ROM SKIN: warm, color normal PSYCH: no abnormalities of mood noted  ED Course  Procedures   3:13 AM Will obtain scrotal US and reassess 5:50 AM Pt found to have epidimytis Pt otherwise well appearing Low risk for STD Will treat for cipro Pt ambulatory Appropriate for d/c home  Labs Review Labs Reviewed  CBC WITH DIFFERENTIAL - Abnormal; Notable for the following:    RBC 6.27 (*)    MCV 75.1 (*)    MCH 25.8 (*)    Neutrophils Relative % 29 (*)    Neutro Abs 1.4 (*)    Lymphocytes Relative 51 (*)    Monocytes Relative 19 (*)    All other components within normal limits  COMPREHENSIVE METABOLIC PANEL - Abnormal; Notable for the following:    Glucose, Bld 163 (*)    GFR calc non Af Amer 77 (*)    GFR calc Af Amer 90 (*)    All other components within normal limits  URINALYSIS, ROUTINE W REFLEX MICROSCOPIC - Abnormal; Notable for the following:    Color, Urine AMBER (*)    APPearance CLOUDY (*)    Specific Gravity, Urine 1.034 (*)    Glucose, UA 100 (*)    Hgb urine dipstick MODERATE (*)    Ketones, ur 15 (*)    Protein, ur >300 (*)    All other components within normal limits  URINE MICROSCOPIC-ADD ON - Abnormal; Notable for the following:    Casts HYALINE CASTS (*)    Crystals CA OXALATE CRYSTALS (*)    All other components within normal limits    Imaging Review US Scrotum  02/02/2014   CLINICAL DATA:  Initial evaluation of painful and swollen left testicle for 2 days. Also with elevated temperature.  EXAM: ULTRASOUND OF SCROTUM  TECHNIQUE: Complete ultrasound examination of the testicles, epididymis, and other scrotal structures was performed.  COMPARISON:  None available.  FINDINGS: Right testicle  Measurements: 4.6 x 3.3 x 2.9 cm. No mass  or microlithiasis visualized.  Left testicle  Measurements: 4.3 x 2.8 x 3.3 cm. No mass or microlithiasis visualized. There was slightly increased vascularity within the left testis as compared to the right.  Right epididymis: Mildly enlarged measuring 1.3 cm with increased vascular flow.  Left epididymis: Mildly enlarged measuring 9 mm with increased vascular flow.  Hydrocele:  Small bilateral hydroceles present.  Varicocele:  None visualized.  IMPRESSION: 1. Mildly increased vascularity within the bilateral epididymi as well as the left testis, suggestive of possible epididymo-orchitis. 2. Small bilateral hydroceles, which may be reactive in nature. 3. No evidence of testicular torsion.   Electronically Signed   By: Rise Mu M.D.   On: 02/02/2014 05:01   [place] Art/ven Flow Abd Pelv Doppler Limited  02/02/2014  CLINICAL DATA:  Initial evaluation of painful and swollen left testicle for 2 days. Also with elevated temperature.  EXAM: ULTRASOUND OF SCROTUM  TECHNIQUE: Complete ultrasound examination of the testicles, epididymis, and other scrotal structures was performed.  COMPARISON:  None available.  FINDINGS: Right testicle  Measurements: 4.6 x 3.3 x 2.9 cm. No mass or microlithiasis visualized.  Left testicle  Measurements: 4.3 x 2.8 x 3.3 cm. No mass or microlithiasis visualized. There was slightly increased vascularity within the left testis as compared to the right.  Right epididymis: Mildly enlarged measuring 1.3 cm with increased vascular flow.  Left epididymis: Mildly enlarged measuring 9 mm with increased vascular flow.  Hydrocele:  Small bilateral hydroceles present.  Varicocele:  None visualized.  IMPRESSION: 1. Mildly increased vascularity within the bilateral epididymi as well as the left testis, suggestive of possible epididymo-orchitis. 2. Small bilateral hydroceles, which may be reactive in nature. 3. No evidence of testicular torsion.   Electronically Signed   By: Rise MuBenjamin  McClintock  M.D.   On: 02/02/2014 05:01      MDM   Final diagnoses:  Epididymitis, left    Nursing notes including past medical history and social history reviewed and considered in documentation Labs/vital reviewed and considered     Joya Gaskinsonald W Tura Roller, MD 02/02/14 [phone]

## 2014-07-25 ENCOUNTER — Other Ambulatory Visit: Payer: Self-pay | Admitting: Internal Medicine

## 2014-07-26 ENCOUNTER — Other Ambulatory Visit: Payer: Self-pay | Admitting: Internal Medicine

## 2014-07-27 ENCOUNTER — Other Ambulatory Visit: Payer: Self-pay | Admitting: Internal Medicine

## 2014-08-01 ENCOUNTER — Other Ambulatory Visit: Payer: Self-pay | Admitting: Internal Medicine

## 2014-08-01 ENCOUNTER — Telehealth: Payer: Self-pay | Admitting: Internal Medicine

## 2014-08-01 NOTE — Telephone Encounter (Signed)
Patient called to request a med refill for his current medications, please f/u with pt.

## 2014-08-05 ENCOUNTER — Encounter: Payer: Self-pay | Admitting: Internal Medicine

## 2014-08-05 ENCOUNTER — Ambulatory Visit: Payer: 59 | Attending: Internal Medicine | Admitting: Internal Medicine

## 2014-08-05 VITALS — BP 134/94 | HR 88 | Temp 98.4°F | Resp 16 | Ht 66.0 in | Wt 236.0 lb

## 2014-08-05 DIAGNOSIS — I10 Essential (primary) hypertension: Secondary | ICD-10-CM | POA: Insufficient documentation

## 2014-08-05 DIAGNOSIS — E1140 Type 2 diabetes mellitus with diabetic neuropathy, unspecified: Secondary | ICD-10-CM | POA: Insufficient documentation

## 2014-08-05 DIAGNOSIS — J302 Other seasonal allergic rhinitis: Secondary | ICD-10-CM

## 2014-08-05 DIAGNOSIS — E11649 Type 2 diabetes mellitus with hypoglycemia without coma: Secondary | ICD-10-CM | POA: Insufficient documentation

## 2014-08-05 DIAGNOSIS — L918 Other hypertrophic disorders of the skin: Secondary | ICD-10-CM | POA: Insufficient documentation

## 2014-08-05 DIAGNOSIS — E119 Type 2 diabetes mellitus without complications: Secondary | ICD-10-CM

## 2014-08-05 LAB — COMPLETE METABOLIC PANEL WITH GFR
ALT: 57 U/L — AB (ref 0–53)
AST: 31 U/L (ref 0–37)
Albumin: 4.8 g/dL (ref 3.5–5.2)
Alkaline Phosphatase: 52 U/L (ref 39–117)
BILIRUBIN TOTAL: 1.1 mg/dL (ref 0.2–1.2)
BUN: 16 mg/dL (ref 6–23)
CALCIUM: 10.2 mg/dL (ref 8.4–10.5)
CHLORIDE: 100 meq/L (ref 96–112)
CO2: 29 meq/L (ref 19–32)
Creat: 1.28 mg/dL (ref 0.50–1.35)
GFR, Est African American: 78 mL/min
GFR, Est Non African American: 68 mL/min
Glucose, Bld: 127 mg/dL — ABNORMAL HIGH (ref 70–99)
Potassium: 4.5 mEq/L (ref 3.5–5.3)
Sodium: 140 mEq/L (ref 135–145)
Total Protein: 8.1 g/dL (ref 6.0–8.3)

## 2014-08-05 LAB — LIPID PANEL
CHOLESTEROL: 197 mg/dL (ref 0–200)
HDL: 40 mg/dL (ref >=40)
LDL CALC: 131 mg/dL — AB (ref 0–99)
Total CHOL/HDL Ratio: 4.9 Ratio
Triglycerides: 128 mg/dL (ref <150)
VLDL: 26 mg/dL (ref 0–40)

## 2014-08-05 LAB — GLUCOSE, POCT (MANUAL RESULT ENTRY): POC Glucose: 165 mg/dl — AB (ref 70–99)

## 2014-08-05 LAB — POCT GLYCOSYLATED HEMOGLOBIN (HGB A1C): Hemoglobin A1C: 6.9

## 2014-08-05 MED ORDER — LINAGLIPTIN-METFORMIN HCL 2.5-1000 MG PO TABS: 1.0000 | ORAL_TABLET | Freq: Every day | ORAL | Status: DC

## 2014-08-05 MED ORDER — LISINOPRIL 10 MG PO TABS: 10.0000 mg | ORAL_TABLET | ORAL | Status: DC

## 2014-08-05 MED ORDER — AMLODIPINE BESYLATE 5 MG PO TABS: 5.0000 mg | ORAL_TABLET | ORAL | Status: DC

## 2014-08-05 NOTE — Progress Notes (Signed)
Pt is here following up on his HTN and diabetes.  Pt states that he has something growing on his right nipple. Pt is having allergy's during this season.

## 2014-08-05 NOTE — Patient Instructions (Signed)
DASH Eating Plan °DASH stands for "Dietary Approaches to Stop Hypertension." The DASH eating plan is a healthy eating plan that has been shown to reduce high blood pressure (hypertension). Additional health benefits may include reducing the risk of type 2 diabetes mellitus, heart disease, and stroke. The DASH eating plan may also help with weight loss. °WHAT DO I NEED TO KNOW ABOUT THE DASH EATING PLAN? °For the DASH eating plan, you will follow these general guidelines: °· Choose foods with a percent daily value for sodium of less than 5% (as listed on the food label). °· Use salt-free seasonings or herbs instead of table salt or sea salt. °· Check with your health care provider or pharmacist before using salt substitutes. °· Eat lower-sodium products, often labeled as "lower sodium" or "no salt added." °· Eat fresh foods. °· Eat more vegetables, fruits, and low-fat dairy products. °· Choose whole grains. Look for the word "whole" as the first word in the ingredient list. °· Choose fish and skinless chicken or turkey more often than red meat. Limit fish, poultry, and meat to 6 oz (170 g) each day. °· Limit sweets, desserts, sugars, and sugary drinks. °· Choose heart-healthy fats. °· Limit cheese to 1 oz (28 g) per day. °· Eat more home-cooked food and less restaurant, buffet, and fast food. °· Limit fried foods. °· Cook foods using methods other than frying. °· Limit canned vegetables. If you do use them, rinse them well to decrease the sodium. °· When eating at a restaurant, ask that your food be prepared with less salt, or no salt if possible. °WHAT FOODS CAN I EAT? °Seek help from a dietitian for individual calorie needs. °Grains °Whole grain or whole wheat bread. Brown rice. Whole grain or whole wheat pasta. Quinoa, bulgur, and whole grain cereals. Low-sodium cereals. Corn or whole wheat flour tortillas. Whole grain cornbread. Whole grain crackers. Low-sodium crackers. °Vegetables °Fresh or frozen vegetables  (raw, steamed, roasted, or grilled). Low-sodium or reduced-sodium tomato and vegetable juices. Low-sodium or reduced-sodium tomato sauce and paste. Low-sodium or reduced-sodium canned vegetables.  °Fruits °All fresh, canned (in natural juice), or frozen fruits. °Meat and Other Protein Products °Ground beef (85% or leaner), grass-fed beef, or beef trimmed of fat. Skinless chicken or turkey. Ground chicken or turkey. Pork trimmed of fat. All fish and seafood. Eggs. Dried beans, peas, or lentils. Unsalted nuts and seeds. Unsalted canned beans. °Dairy °Low-fat dairy products, such as skim or 1% milk, 2% or reduced-fat cheeses, low-fat ricotta or cottage cheese, or plain low-fat yogurt. Low-sodium or reduced-sodium cheeses. °Fats and Oils °Tub margarines without trans fats. Light or reduced-fat mayonnaise and salad dressings (reduced sodium). Avocado. Safflower, olive, or canola oils. Natural peanut or almond butter. °Other °Unsalted popcorn and pretzels. °The items listed above may not be a complete list of recommended foods or beverages. Contact your dietitian for more options. °WHAT FOODS ARE NOT RECOMMENDED? °Grains °White bread. White pasta. White rice. Refined cornbread. Bagels and croissants. Crackers that contain trans fat. °Vegetables °Creamed or fried vegetables. Vegetables in a cheese sauce. Regular canned vegetables. Regular canned tomato sauce and paste. Regular tomato and vegetable juices. °Fruits °Dried fruits. Canned fruit in light or heavy syrup. Fruit juice. °Meat and Other Protein Products °Fatty cuts of meat. Ribs, chicken wings, bacon, sausage, bologna, salami, chitterlings, fatback, hot dogs, bratwurst, and packaged luncheon meats. Salted nuts and seeds. Canned beans with salt. °Dairy °Whole or 2% milk, cream, half-and-half, and cream cheese. Whole-fat or sweetened yogurt. Full-fat   cheeses or blue cheese. Nondairy creamers and whipped toppings. Processed cheese, cheese spreads, or cheese  curds. °Condiments °Onion and garlic salt, seasoned salt, table salt, and sea salt. Canned and packaged gravies. Worcestershire sauce. Tartar sauce. Barbecue sauce. Teriyaki sauce. Soy sauce, including reduced sodium. Steak sauce. Fish sauce. Oyster sauce. Cocktail sauce. Horseradish. Ketchup and mustard. Meat flavorings and tenderizers. Bouillon cubes. Hot sauce. Tabasco sauce. Marinades. Taco seasonings. Relishes. °Fats and Oils °Butter, stick margarine, lard, shortening, ghee, and bacon fat. Coconut, palm kernel, or palm oils. Regular salad dressings. °Other °Pickles and olives. Salted popcorn and pretzels. °The items listed above may not be a complete list of foods and beverages to avoid. Contact your dietitian for more information. °WHERE CAN I FIND MORE INFORMATION? °National Heart, Lung, and [employer] Institute: www.nhlbi.nih.gov/health/health-topics/topics/dash/ °Document Released: 04/08/2011 Document Revised: 09/03/2013 Document Reviewed: 02/21/2013 °ExitCare® Patient Information ©2015 [employer]. This information is not intended to replace advice given to you by your health care provider. Make sure you discuss any questions you have with your health care provider. ° °

## 2014-08-05 NOTE — Progress Notes (Signed)
Patient ID: Scott [name], male   DOB: 04/28/1971, 44 y.o.   MRN: [id number]   1. HTN: Medication: amlodipine and lisinopril. He reports that he has been out of medication for 2 days.  Home BP monitoring: does not check  Negative WUJ:WJXBJYNWGROS:headaches, blurred vision, edema, chest pain, palpitations    2. DM2:  Medication: Jentadueto Home CBG monitoring: spordically  Hypoglycemic event: yesterday,, exercise. Rare episode Positive ROS neuropathy of BUE occassionally  Negative ROS: dizziness, polyuria/dipsia  3. Patient is concerned about a growth on his right nipple that has been present for 3 months. Not painful. Has gotten slightly bigger.    Social History reviewed: Smoker never  Exercise not currently   Physical Exam  Constitutional: He is oriented to person, place, and time.  Cardiovascular: Normal rate, regular rhythm and normal heart sounds.   Pulses:      Dorsalis pedis pulses are 2+ on the right side, and 2+ on the left side.  Pulmonary/Chest: Effort normal and breath sounds normal.    Feet:  Right Foot:  Skin Integrity: Negative for skin breakdown.  Left Foot:  Skin Integrity: Negative for skin breakdown.  Neurological: He is alert and oriented to person, place, and time.   Scott [name] was seen today for follow-up.  Diagnoses and all orders for this visit:  Type 2 diabetes mellitus without complication Orders: -     Glucose (CBG) -     HgB A1c -     Microalbumin, urine -     Lipid panel -     Linagliptin-Metformin HCl (JENTADUETO) 2.08-998 MG TABS; Take 1 tablet by mouth daily. Gave patient a discount card for [employer]. He may continue on current medication, he is well controlled. Advised diet and exercise  HTN (hypertension), benign Orders: -     COMPLETE METABOLIC PANEL WITH GFR -     lisinopril (PRINIVIL,ZESTRIL) 10 MG tablet; Take 1 tablet (10 mg total) by mouth every other day. *alternates days with amlodipine* -     amLODipine (NORVASC) 5 MG tablet; Take 1  tablet (5 mg total) by mouth every other day. *alternates days taking with lisinopril*  Refilled medications. BP is elevated in office today d/t being out of medication for 3 days, but otherwise he is controlled.   Seasonal allergies Continue with flonase, may pick up OTC zyrtec for better relief.   Skin tag Comments:  right nipple Orders: -     Ambulatory referral to Dermatology I will not remove skin tag in office due to tag being on patients nipple. I will send him to dermatology since it is a sensitive area.  Return in about 6 months (around 02/04/2015) for DM/HTN.  Holland CommonsKECK, [place] Decoursey, NP 08/05/2014 9:19 PM

## 2014-08-06 LAB — MICROALBUMIN, URINE: MICROALB UR: 246.7 mg/dL — AB (ref <2.0)

## 2014-08-23 ENCOUNTER — Telehealth: Payer: Self-pay | Admitting: *Deleted

## 2014-08-23 NOTE — Telephone Encounter (Signed)
-----   Message from Ambrose FinlandValerie A Keck, NP sent at 08/21/2014 12:16 AM EDT ----- Atorvastatin 20 mg daily. His cholesterol is high. Please educate

## 2014-10-08 ENCOUNTER — Telehealth: Payer: Self-pay | Admitting: Internal Medicine

## 2014-10-08 DIAGNOSIS — E785 Hyperlipidemia, unspecified: Secondary | ICD-10-CM

## 2014-10-08 MED ORDER — ATORVASTATIN CALCIUM 20 MG PO TABS: 20.0000 mg | ORAL_TABLET | Freq: Every day | ORAL | Status: DC

## 2014-10-08 NOTE — Telephone Encounter (Signed)
I do not have a history of having patient on Vitamin D. I do not have a recent vitamin D level on this patient. If he is concerned he may get a OTC multivitamin which has Vitamin D in it.

## 2014-10-08 NOTE — Telephone Encounter (Signed)
Patient called to request his blood work results, he states that he never received a phone call. Patient is also asking for a refill on Vit D. [facility] [address] Please f/u

## 2014-10-08 NOTE — Telephone Encounter (Signed)
Gave results and placed order for atorvastatin 20 mg po g day.  Patient also asking for refill on vitamin D tablets and is asking if his vitamin D is low.  Routing to PCP

## 2014-11-11 LAB — HM DIABETES EYE EXAM

## 2014-12-02 ENCOUNTER — Encounter: Payer: Self-pay | Admitting: Internal Medicine

## 2014-12-02 ENCOUNTER — Ambulatory Visit: Payer: 59 | Attending: Internal Medicine | Admitting: Internal Medicine

## 2014-12-02 VITALS — BP 122/82 | HR 91 | Temp 98.0°F | Resp 18 | Ht 66.0 in | Wt 245.0 lb

## 2014-12-02 DIAGNOSIS — Z87891 Personal history of nicotine dependence: Secondary | ICD-10-CM | POA: Insufficient documentation

## 2014-12-02 DIAGNOSIS — G5602 Carpal tunnel syndrome, left upper limb: Secondary | ICD-10-CM | POA: Insufficient documentation

## 2014-12-02 DIAGNOSIS — G5601 Carpal tunnel syndrome, right upper limb: Secondary | ICD-10-CM | POA: Diagnosis not present

## 2014-12-02 DIAGNOSIS — E785 Hyperlipidemia, unspecified: Secondary | ICD-10-CM | POA: Insufficient documentation

## 2014-12-02 DIAGNOSIS — E119 Type 2 diabetes mellitus without complications: Secondary | ICD-10-CM | POA: Diagnosis not present

## 2014-12-02 DIAGNOSIS — M549 Dorsalgia, unspecified: Secondary | ICD-10-CM | POA: Insufficient documentation

## 2014-12-02 DIAGNOSIS — I10 Essential (primary) hypertension: Secondary | ICD-10-CM

## 2014-12-02 DIAGNOSIS — G5603 Carpal tunnel syndrome, bilateral upper limbs: Secondary | ICD-10-CM

## 2014-12-02 LAB — POCT GLYCOSYLATED HEMOGLOBIN (HGB A1C): HEMOGLOBIN A1C: 8.6

## 2014-12-02 LAB — BASIC METABOLIC PANEL
BUN: 15 mg/dL (ref 7–25)
CALCIUM: 9.3 mg/dL (ref 8.6–10.3)
CHLORIDE: 99 mmol/L (ref 98–110)
CO2: 29 mmol/L (ref 20–31)
Creat: 1.14 mg/dL (ref 0.60–1.35)
Glucose, Bld: 231 mg/dL — ABNORMAL HIGH (ref 65–99)
Potassium: 4.3 mmol/L (ref 3.5–5.3)
SODIUM: 140 mmol/L (ref 135–146)

## 2014-12-02 LAB — GLUCOSE, POCT (MANUAL RESULT ENTRY)
POC Glucose: 295 mg/dl — AB (ref 70–99)
POC Glucose: 236 mg/dl — AB (ref 70–99)

## 2014-12-02 MED ORDER — LISINOPRIL 10 MG PO TABS: 10.0000 mg | ORAL_TABLET | Freq: Every day | ORAL | Status: DC

## 2014-12-02 MED ORDER — GABAPENTIN 300 MG PO CAPS: 300.0000 mg | ORAL_CAPSULE | Freq: Every day | ORAL | Status: DC

## 2014-12-02 MED ORDER — LINAGLIPTIN-METFORMIN HCL 2.5-1000 MG PO TABS: 1.0000 | ORAL_TABLET | Freq: Every day | ORAL | Status: DC

## 2014-12-02 MED ORDER — ATORVASTATIN CALCIUM 20 MG PO TABS: 20.0000 mg | ORAL_TABLET | Freq: Every day | ORAL | Status: DC

## 2014-12-02 NOTE — Progress Notes (Signed)
Patient ID: Scott [name], male   DOB: 27-Aug-1970, 44 y.o.   MRN: [id number]   Scott [name] is a 44 y.o. male with a history of HTN, T2DM, and OSA. He presents today for a follow up.   HTN: Medication: amlodipine and lisinopril. He reports that he has been out of medication for 2 days.  Home BP monitoring: does not check  Negative WUJ:WJXBJYNWG, blurred vision, edema, chest pain, palpitations    2. DM2:  Medication: Jentadueto Home CBG monitoring: spordically  Hypoglycemic event: yesterday,, exercise. Rare episode Positive ROS neuropathy of BUE occassionally  Negative ROS: dizziness, polyuria/dipsia Patient admits to not exercising as much as before and eating increased carbs with meals.  3. Patient is concerned about bilateral wrist pain. Patient reports when he wakes up his right arm feels like pins and right arm goes to sleep with typing. Right arm pain is worse that Left. Patient is dominate in right hand. Wrist/arm pain has been going on for 2-3 months. Pain currently rated as a a 4/10.   Social He is a former smoker He reports that since having more back pain he has not been able to exercise over the last 3 months.   Physical Exam  Constitutional: He is oriented to person, place, and time.  Cardiovascular: Normal rate, regular rhythm and normal heart sounds.   Pulmonary/Chest: Effort normal and breath sounds normal.  Feet:  Right Foot:  Skin Integrity: Negative for skin breakdown.  Left Foot:  Skin Integrity: Negative for skin breakdown.  Neurological: He is alert and oriented to person, place, and time.   Scott [name] was seen today for follow-up.  Diagnoses and all orders for this visit:  Type 2 diabetes mellitus without complication Orders: -     Glucose (CBG) -     HgB A1c -     Glucose (CBG) -     Basic Metabolic Panel -     Refilled Linagliptin-Metformin HCl (JENTADUETO) 2.08-998 MG TABS; Take 1 tablet by mouth daily. I have stressed diet and exercise  with patient. We discussed how his A1C went from 6.9% to 8.6%. He is in agreement with plan.   HTN (hypertension), benign Orders: -     lisinopril (PRINIVIL,ZESTRIL) 10 MG tablet; Take 1 tablet (10 mg total) by mouth daily. I will like patient to begin taking lisinopril everyday versus alternating with amlodipine. I have explained that taking lisinopril daily will help to lower the protein in his urine and protect his kidneys.  I will d/c amlodipine and bring him back in 2 weeks for a BP check  Hyperlipidemia Orders: -     Refill atorvastatin (LIPITOR) 20 MG tablet; Take 1 tablet (20 mg total) by mouth daily. Education provided on proper lifestyle changes in order to lower cholesterol. Patient advised to maintain healthy weight and to keep total fat intake at 25-35% of total calories and carbohydrates 50-60% of total daily calories. Explained how high cholesterol places patient at risk for heart disease. Patient placed on appropriate medication and repeat labs in 6 months   Bilateral carpal tunnel syndrome Orders: -     Begin gabapentin (NEURONTIN) 300 MG capsule; Take 1 capsule (300 mg total) by mouth at bedtime. I have explained to patient that I want him to avoid all NSAID's to prevent kidney damage. If gabapentin is unsuccessful in relieving pain I will send him to Orthopedics. He reports that he has had a surgery several years ago for carpal tunnel syndrome and has been  on gabapentin in the past as well.  Return in about 2 weeks (around 12/16/2014) for Nurse Visit-BP check and 3 mo PCP .  Scott Finland, NP 12/02/2014 6:32 PM

## 2014-12-02 NOTE — Progress Notes (Signed)
Patient here for DM follow up and pain in wrists/arms.   Patient reports when he wakes up with right arm feeling like pins and right arm goes to sleep with typing. Left arm is not as bad as right arm. Patient is dominate in right hand. Wrist/arm pain has been going on for 2.5- 3 months.   Patient reports pain in wrists as follows: Left wrist at level 4 described as numb Right wrist is not currently hurting Right arm at level 4 described as discomfort.   Current BS 295. Patient has taken DM meds today after eating. Reports eating at 3pm, patient ate 2 small grilled hamburgers and baked beans.

## 2014-12-13 ENCOUNTER — Telehealth: Payer: Self-pay | Admitting: Internal Medicine

## 2014-12-13 NOTE — Telephone Encounter (Signed)
Called patient and patient verified date of birth.  Patient was given normal lab results.  Patient verbalized understanding.  Patient states he was instructed to call back if his carpal tunnel symptoms worsen or do not improve.  Patient states that he has been in pain yesterday and today and would like a referral to see a specialist in regards to his carpal tunnel.  Patient has no further needs at this time.

## 2014-12-13 NOTE — Telephone Encounter (Signed)
Patient called requesting a referral for his hand. Please follow up.

## 2014-12-16 ENCOUNTER — Other Ambulatory Visit: Payer: Self-pay | Admitting: Internal Medicine

## 2014-12-16 DIAGNOSIS — G5603 Carpal tunnel syndrome, bilateral upper limbs: Secondary | ICD-10-CM

## 2014-12-17 ENCOUNTER — Encounter: Payer: Self-pay | Admitting: Family Medicine

## 2014-12-17 ENCOUNTER — Ambulatory Visit (INDEPENDENT_AMBULATORY_CARE_PROVIDER_SITE_OTHER): Payer: 59 | Admitting: Family Medicine

## 2014-12-17 VITALS — BP 132/88 | HR 72 | Ht 66.0 in | Wt 235.0 lb

## 2014-12-17 DIAGNOSIS — M79642 Pain in left hand: Secondary | ICD-10-CM | POA: Diagnosis not present

## 2014-12-17 DIAGNOSIS — G5602 Carpal tunnel syndrome, left upper limb: Secondary | ICD-10-CM | POA: Diagnosis not present

## 2014-12-17 DIAGNOSIS — M79641 Pain in right hand: Secondary | ICD-10-CM

## 2014-12-17 DIAGNOSIS — G5603 Carpal tunnel syndrome, bilateral upper limbs: Secondary | ICD-10-CM

## 2014-12-17 DIAGNOSIS — G5601 Carpal tunnel syndrome, right upper limb: Secondary | ICD-10-CM | POA: Diagnosis not present

## 2014-12-17 MED ORDER — METHYLPREDNISOLONE ACETATE 40 MG/ML IJ SUSP
40.0000 mg | Freq: Once | INTRAMUSCULAR | Status: AC
  Administered 2014-12-17: 40 mg via INTRA_ARTICULAR

## 2014-12-17 NOTE — Patient Instructions (Signed)
You have carpal tunnel syndrome. Wear the wrist brace at nighttime and as often as possible during the day A prednisone dose pack is a consideration. Corticosteroid injection is a consideration to help with pain and inflammation - you were given this today. If not improving, will consider nerve conduction studies. Follow up with me in 1 month.

## 2014-12-18 DIAGNOSIS — G5603 Carpal tunnel syndrome, bilateral upper limbs: Secondary | ICD-10-CM | POA: Insufficient documentation

## 2014-12-18 NOTE — Assessment & Plan Note (Signed)
discussed options and went ahead with bilateral injections today.  Wrist braces at nighttime and as often as possible during the day.  Consider prednisone dose pack, nerve conduction studies if not improving.  F/u in 1 month.  After informed written consent patient was seated in chair in exam room.  Radial to the right median nerve identified by ultrasound, prepped with alcohol swab and injected with 2:1 marcaine: depomedrol.  Patient tolerated procedure well without immediate complications.  After informed written consent patient was seated in chair in exam room.  Radial to the left median nerve identified by ultrasound, prepped with alcohol swab and injected with 2:1 marcaine: depomedrol.  Patient tolerated procedure well without immediate complications.

## 2014-12-18 NOTE — Progress Notes (Signed)
PCP and referred by: Ambrose Finland, NP  Subjective:   HPI: Patient is a 44 y.o. male here for bilateral carpal tunnel syndrome.  Patient reports his current issues with wrists/hands started about 3-4 months ago. Pain volar but also dorsally right > left hands with numbness/tingling into middle 3 digits and some into thumbs. Trying gabapentin qhs without much difference. Pain in right 8/10, left 4/10.  Past Medical History  Diagnosis Date  . Diabetes mellitus   . Hypertension   . DVT (deep venous thrombosis)   . Obesity     Current Outpatient Prescriptions on File Prior to Visit  Medication Sig Dispense Refill  . amLODipine (NORVASC) 5 MG tablet Take 1 tablet (5 mg total) by mouth every other day. *alternates days taking with lisinopril* 60 tablet 4  . aspirin EC 81 MG tablet Take 81 mg by mouth daily.    [name] atorvastatin (LIPITOR) 20 MG tablet Take 1 tablet (20 mg total) by mouth daily. 90 tablet 3  . fluticasone (FLONASE) 50 MCG/ACT nasal spray Place 1 spray into both nostrils daily as needed for allergies or rhinitis.    [name] gabapentin (NEURONTIN) 300 MG capsule Take 1 capsule (300 mg total) by mouth at bedtime. 30 capsule 5  . Linagliptin-Metformin HCl (JENTADUETO) 2.08-998 MG TABS Take 1 tablet by mouth daily. 60 tablet 5  . lisinopril (PRINIVIL,ZESTRIL) 10 MG tablet Take 1 tablet (10 mg total) by mouth daily. 60 tablet 4   No current facility-administered medications on file prior to visit.    Past Surgical History  Procedure Laterality Date  . Rotator cuff repair      No Known Allergies  Social History   Social History  . Marital Status: Married    Spouse Name: N/A  . Number of Children: N/A  . Years of Education: N/A   Occupational History  . real estate agent    Social History Main Topics  . Smoking status: Never Smoker   . Smokeless tobacco: Never Used  . Alcohol Use: No  . Drug Use: No  . Sexual Activity: Not on file   Other Topics Concern  . Not on  file   Social History Narrative    Family History  Problem Relation Age of Onset  . Hypertension Other   . Hyperlipidemia Other   . Diabetes Other   . Hypertension Mother   . Kidney disease Father     BP 132/88 mmHg  Pulse 72  Ht  (1.676 m)  Wt 235 lb (106.595 kg)  BMI 37.95 kg/m2  Review of Systems: See HPI above.    Objective:  Physical Exam:  Gen: NAD  Bilateral wrists/hands: No gross deformity, swelling, bruising, atrophy. TTP over carpal tunnel only on right.  No other tenderness hands/wrists. FROM wrists without pain.  FROM digits with 5/5 strength. Negative tinels. + phalens R > L Sensation intact to light touch.    Assessment & Plan:  1. Bilateral carpal tunnel syndrome - discussed options and went ahead with bilateral injections today.  Wrist braces at nighttime and as often as possible during the day.  Consider prednisone dose pack, nerve conduction studies if not improving.  F/u in 1 month.  After informed written consent patient was seated in chair in exam room.  Radial to the right median nerve identified by ultrasound, prepped with alcohol swab and injected with 2:1 marcaine: depomedrol.  Patient tolerated procedure well without immediate complications.  After informed written consent patient was seated in  chair in exam room.  Radial to the left median nerve identified by ultrasound, prepped with alcohol swab and injected with 2:1 marcaine: depomedrol.  Patient tolerated procedure well without immediate complications.

## 2014-12-23 ENCOUNTER — Ambulatory Visit: Payer: 59 | Attending: Internal Medicine | Admitting: *Deleted

## 2014-12-23 VITALS — BP 132/85 | HR 88 | Temp 98.0°F | Resp 20 | Ht 66.0 in | Wt 242.6 lb

## 2014-12-23 DIAGNOSIS — I10 Essential (primary) hypertension: Secondary | ICD-10-CM | POA: Insufficient documentation

## 2014-12-23 NOTE — Patient Instructions (Signed)
DASH Eating Plan °DASH stands for "Dietary Approaches to Stop Hypertension." The DASH eating plan is a healthy eating plan that has been shown to reduce high blood pressure (hypertension). Additional health benefits may include reducing the risk of type 2 diabetes mellitus, heart disease, and stroke. The DASH eating plan may also help with weight loss. °WHAT DO I NEED TO KNOW ABOUT THE DASH EATING PLAN? °For the DASH eating plan, you will follow these general guidelines: °· Choose foods with a percent daily value for sodium of less than 5% (as listed on the food label). °· Use salt-free seasonings or herbs instead of table salt or sea salt. °· Check with your health care provider or pharmacist before using salt substitutes. °· Eat lower-sodium products, often labeled as "lower sodium" or "no salt added." °· Eat fresh foods. °· Eat more vegetables, fruits, and low-fat dairy products. °· Choose whole grains. Look for the word "whole" as the first word in the ingredient list. °· Choose fish and skinless chicken or turkey more often than red meat. Limit fish, poultry, and meat to 6 oz (170 g) each day. °· Limit sweets, desserts, sugars, and sugary drinks. °· Choose heart-healthy fats. °· Limit cheese to 1 oz (28 g) per day. °· Eat more home-cooked food and less restaurant, buffet, and fast food. °· Limit fried foods. °· Cook foods using methods other than frying. °· Limit canned vegetables. If you do use them, rinse them well to decrease the sodium. °· When eating at a restaurant, ask that your food be prepared with less salt, or no salt if possible. °WHAT FOODS CAN I EAT? °Seek help from a dietitian for individual calorie needs. °Grains °Whole grain or whole wheat bread. Brown rice. Whole grain or whole wheat pasta. Quinoa, bulgur, and whole grain cereals. Low-sodium cereals. Corn or whole wheat flour tortillas. Whole grain cornbread. Whole grain crackers. Low-sodium crackers. °Vegetables °Fresh or frozen vegetables  (raw, steamed, roasted, or grilled). Low-sodium or reduced-sodium tomato and vegetable juices. Low-sodium or reduced-sodium tomato sauce and paste. Low-sodium or reduced-sodium canned vegetables.  °Fruits °All fresh, canned (in natural juice), or frozen fruits. °Meat and Other Protein Products °Ground beef (85% or leaner), grass-fed beef, or beef trimmed of fat. Skinless chicken or turkey. Ground chicken or turkey. Pork trimmed of fat. All fish and seafood. Eggs. Dried beans, peas, or lentils. Unsalted nuts and seeds. Unsalted canned beans. °Dairy °Low-fat dairy products, such as skim or 1% milk, 2% or reduced-fat cheeses, low-fat ricotta or cottage cheese, or plain low-fat yogurt. Low-sodium or reduced-sodium cheeses. °Fats and Oils °Tub margarines without trans fats. Light or reduced-fat mayonnaise and salad dressings (reduced sodium). Avocado. Safflower, olive, or canola oils. Natural peanut or almond butter. °Other °Unsalted popcorn and pretzels. °The items listed above may not be a complete list of recommended foods or beverages. Contact your dietitian for more options. °WHAT FOODS ARE NOT RECOMMENDED? °Grains °White bread. White pasta. White rice. Refined cornbread. Bagels and croissants. Crackers that contain trans fat. °Vegetables °Creamed or fried vegetables. Vegetables in a cheese sauce. Regular canned vegetables. Regular canned tomato sauce and paste. Regular tomato and vegetable juices. °Fruits °Dried fruits. Canned fruit in light or heavy syrup. Fruit juice. °Meat and Other Protein Products °Fatty cuts of meat. Ribs, chicken wings, bacon, sausage, bologna, salami, chitterlings, fatback, hot dogs, bratwurst, and packaged luncheon meats. Salted nuts and seeds. Canned beans with salt. °Dairy °Whole or 2% milk, cream, half-and-half, and cream cheese. Whole-fat or sweetened yogurt. Full-fat   cheeses or blue cheese. Nondairy creamers and whipped toppings. Processed cheese, cheese spreads, or cheese  curds. °Condiments °Onion and garlic salt, seasoned salt, table salt, and sea salt. Canned and packaged gravies. Worcestershire sauce. Tartar sauce. Barbecue sauce. Teriyaki sauce. Soy sauce, including reduced sodium. Steak sauce. Fish sauce. Oyster sauce. Cocktail sauce. Horseradish. Ketchup and mustard. Meat flavorings and tenderizers. Bouillon cubes. Hot sauce. Tabasco sauce. Marinades. Taco seasonings. Relishes. °Fats and Oils °Butter, stick margarine, lard, shortening, ghee, and bacon fat. Coconut, palm kernel, or palm oils. Regular salad dressings. °Other °Pickles and olives. Salted popcorn and pretzels. °The items listed above may not be a complete list of foods and beverages to avoid. Contact your dietitian for more information. °WHERE CAN I FIND MORE INFORMATION? °National Heart, Lung, and [employer] Institute: www.nhlbi.nih.gov/health/health-topics/topics/dash/ °Document Released: 04/08/2011 Document Revised: 09/03/2013 Document Reviewed: 02/21/2013 °ExitCare® Patient Information ©2015 [employer]. This information is not intended to replace advice given to you by your health care provider. Make sure you discuss any questions you have with your health care provider. ° °

## 2014-12-23 NOTE — Progress Notes (Signed)
Patient presents for BP check after d/c amlodipine Med list reviewed; states taking all meds as directed: taking lisinopril 10 mg daily Discussed need for low sodium diet and using Mrs. Dash as alternative to salt Encouraged to choose foods with 5% or less of daily value for sodium. Discussed walking 30-60 minutes per day for exercise Patient denies headaches, blurred vision, SHOB, chest pain  C/o pain in L4-L5 X 4 years; denies pain at present. States received cortisone injections in that area 1 year ago and would like to restart   Filed Vitals:   12/23/14 1455  BP: 132/85  Pulse: 88  Temp: 98 F (36.7 C)  Resp: 20     Patient advised to call for med refills at least 7 days before running out so as not to go without.  Patient aware that he is to f/u with PCP 3 months from last visit (Due 03/04/15)  Patient given literature on DASH Eating Plan  Note routed to PCP

## 2015-01-17 ENCOUNTER — Ambulatory Visit: Payer: 59 | Admitting: Family Medicine

## 2015-01-20 ENCOUNTER — Encounter: Payer: Self-pay | Admitting: Family Medicine

## 2015-01-20 ENCOUNTER — Ambulatory Visit (INDEPENDENT_AMBULATORY_CARE_PROVIDER_SITE_OTHER): Payer: 59 | Admitting: Family Medicine

## 2015-01-20 VITALS — Ht 66.0 in | Wt 235.0 lb

## 2015-01-20 DIAGNOSIS — G5603 Carpal tunnel syndrome, bilateral upper limbs: Secondary | ICD-10-CM

## 2015-01-20 DIAGNOSIS — G5601 Carpal tunnel syndrome, right upper limb: Secondary | ICD-10-CM | POA: Diagnosis not present

## 2015-01-20 DIAGNOSIS — G5602 Carpal tunnel syndrome, left upper limb: Secondary | ICD-10-CM

## 2015-01-20 MED ORDER — PREDNISONE 10 MG PO TABS
ORAL_TABLET | ORAL | Status: DC
Start: 2015-01-20 — End: 2015-05-08

## 2015-01-20 NOTE — Patient Instructions (Addendum)
Continue using the wrist brace as you have been. Icing 15 minutes at a time to back of wrist 3-4 times a day. Take prednisone as directed for 6 days. If not improving would consider the nerve conduction studies or occupational therapy (I wouldn't recommend these right now for the carpal tunnel or synovitis though). Follow up with me in 1 month.

## 2015-01-22 NOTE — Assessment & Plan Note (Signed)
left side improved with injection, bracing.  Right side still symptomatic though his exam is improved from carpal tunnel - suggestion of synovitis now.  Will try prednisone dose pack, continue bracing, icing.  F/u in 1 month.  Consider NCVs, occupational therapy depending on how he's doing.

## 2015-01-22 NOTE — Progress Notes (Signed)
PCP and referred by: Ambrose Finland, NP  Subjective:   HPI: Patient is a 44 y.o. male here for bilateral carpal tunnel syndrome.  8/16: Patient reports his current issues with wrists/hands started about 3-4 months ago. Pain volar but also dorsally right > left hands with numbness/tingling into middle 3 digits and some into thumbs. Trying gabapentin qhs without much difference. Pain in right 8/10, left 4/10.  9/19: Patient reports left hand improved, no issues. Right hand still having pain up to 10/10 especially with movement. Using braces on both sides. Can radiate up the arm at times.  Past Medical History  Diagnosis Date  . Diabetes mellitus   . Hypertension   . DVT (deep venous thrombosis)   . Obesity     Current Outpatient Prescriptions on File Prior to Visit  Medication Sig Dispense Refill  . aspirin EC 81 MG tablet Take 81 mg by mouth daily.    [name] atorvastatin (LIPITOR) 20 MG tablet Take 1 tablet (20 mg total) by mouth daily. 90 tablet 3  . fluticasone (FLONASE) 50 MCG/ACT nasal spray Place 1 spray into both nostrils daily as needed for allergies or rhinitis.    [name] gabapentin (NEURONTIN) 300 MG capsule Take 1 capsule (300 mg total) by mouth at bedtime. 30 capsule 5  . Linagliptin-Metformin HCl (JENTADUETO) 2.08-998 MG TABS Take 1 tablet by mouth daily. 60 tablet 5  . lisinopril (PRINIVIL,ZESTRIL) 10 MG tablet Take 1 tablet (10 mg total) by mouth daily. 60 tablet 4   No current facility-administered medications on file prior to visit.    Past Surgical History  Procedure Laterality Date  . Rotator cuff repair      No Known Allergies  Social History   Social History  . Marital Status: Married    Spouse Name: N/A  . Number of Children: N/A  . Years of Education: N/A   Occupational History  . real estate agent    Social History Main Topics  . Smoking status: Never Smoker   . Smokeless tobacco: Never Used  . Alcohol Use: No  . Drug Use: No  . Sexual  Activity: Not on file   Other Topics Concern  . Not on file   Social History Narrative    Family History  Problem Relation Age of Onset  . Hypertension Other   . Hyperlipidemia Other   . Diabetes Other   . Hypertension Mother   . Kidney disease Father     Ht  (1.676 m)  Wt 235 lb (106.595 kg)  BMI 37.95 kg/m2  Review of Systems: See HPI above.    Objective:  Physical Exam:  Gen: NAD  Right wrist/hand: No gross deformity, swelling, bruising, atrophy. No TTP over carpal tunnel now.  Mild TTP dorsal wrist joint.  No other tenderness. Mild limitation extension of wrist with pain. Negative tinels and phalens. Sensation intact to light touch.  Left wrist/hand: No gross deformity, swelling, bruising, atrophy. No TTP over carpal tunnel now.  No other tenderness. FROM wrist, digits without pain. Negative tinels and phalens. Sensation intact to light touch.    Assessment & Plan:  1. Bilateral carpal tunnel syndrome - left side improved with injection, bracing.  Right side still symptomatic though his exam is improved from carpal tunnel - suggestion of synovitis now.  Will try prednisone dose pack, continue bracing, icing.  F/u in 1 month.  Consider NCVs, occupational therapy depending on how he's doing.

## 2015-02-19 ENCOUNTER — Encounter: Payer: Self-pay | Admitting: Family Medicine

## 2015-02-19 ENCOUNTER — Ambulatory Visit (INDEPENDENT_AMBULATORY_CARE_PROVIDER_SITE_OTHER): Payer: 59 | Admitting: Family Medicine

## 2015-02-19 VITALS — BP 143/89 | HR 76 | Ht 66.0 in | Wt 235.0 lb

## 2015-02-19 DIAGNOSIS — G5603 Carpal tunnel syndrome, bilateral upper limbs: Secondary | ICD-10-CM

## 2015-02-24 ENCOUNTER — Other Ambulatory Visit: Payer: Self-pay | Admitting: Internal Medicine

## 2015-02-24 NOTE — Assessment & Plan Note (Signed)
left side improved with injection, bracing.  Right side with some improvement following prednisone, bracing, injection.  Advised to more regularly use brace on this side and see if this takes care of rest of symptoms.  Can consider repeat injection, NCVs/EMGs if not improving over next 6 weeks.

## 2015-02-24 NOTE — Progress Notes (Signed)
PCP and referred by: Ambrose FinlandValerie A Keck, NP  Subjective:   HPI: Patient is a 44 y.o. male here for bilateral carpal tunnel syndrome.  8/16: Patient reports his current issues with wrists/hands started about 3-4 months ago. Pain volar but also dorsally right > left hands with numbness/tingling into middle 3 digits and some into thumbs. Trying gabapentin qhs without much difference. Pain in right 8/10, left 4/10.  9/19: Patient reports left hand improved, no issues. Right hand still having pain up to 10/10 especially with movement. Using braces on both sides. Can radiate up the arm at times.  10/19: Patient reports left hand still without any problems. Right hand some improvement - down to 5/10 level dull or sharp pain volar hand, wrist. Tingling at times but not lasting into middle 3 digits, some into thumb. Finished prednisone. Has been icing - no longer using brace regularly. No swelling. No skin changes, fever, other complaints.  Past Medical History  Diagnosis Date  . Diabetes mellitus   . Hypertension   . DVT (deep venous thrombosis) (HCC)   . Obesity     Current Outpatient Prescriptions on File Prior to Visit  Medication Sig Dispense Refill  . amLODipine (NORVASC) 5 MG tablet     . aspirin EC 81 MG tablet Take 81 mg by mouth daily.    [name]. atorvastatin (LIPITOR) 20 MG tablet Take 1 tablet (20 mg total) by mouth daily. 90 tablet 3  . fluticasone (FLONASE) 50 MCG/ACT nasal spray Place 1 spray into both nostrils daily as needed for allergies or rhinitis.    [name]. gabapentin (NEURONTIN) 300 MG capsule Take 1 capsule (300 mg total) by mouth at bedtime. 30 capsule 5  . Linagliptin-Metformin HCl (JENTADUETO) 2.08-998 MG TABS Take 1 tablet by mouth daily. 60 tablet 5  . lisinopril (PRINIVIL,ZESTRIL) 10 MG tablet Take 1 tablet (10 mg total) by mouth daily. 60 tablet 4  . predniSONE (DELTASONE) 10 MG tablet 6 tabs po day 1, 5 tabs po day 2, 4 tabs po day 3, 3 tabs po day 4, 2 tabs po day  5, 1 tab po day 6 21 tablet 0   No current facility-administered medications on file prior to visit.    Past Surgical History  Procedure Laterality Date  . Rotator cuff repair      No Known Allergies  Social History   Social History  . Marital Status: Married    Spouse Name: N/A  . Number of Children: N/A  . Years of Education: N/A   Occupational History  . real estate agent    Social History Main Topics  . Smoking status: Never Smoker   . Smokeless tobacco: Never Used  . Alcohol Use: No  . Drug Use: No  . Sexual Activity: Not on file   Other Topics Concern  . Not on file   Social History Narrative    Family History  Problem Relation Age of Onset  . Hypertension Other   . Hyperlipidemia Other   . Diabetes Other   . Hypertension Mother   . Kidney disease Father     BP 143/89 mmHg  Pulse 76  Ht 5\' 6"  (1.676 m)  Wt 235 lb (106.595 kg)  BMI 37.95 kg/m2  Review of Systems: See HPI above.    Objective:  Physical Exam:  Gen: NAD  Right wrist/hand: No gross deformity, swelling, bruising, atrophy. Mild TTP carpal tunnel.  No longer with TTP dorsal wrist joint.  No other tenderness. Mild limitation extension  of wrist with minimal pain. Negative tinels. Positive phalens. Sensation intact to light touch.  Left wrist/hand: No gross deformity, swelling, bruising, atrophy. No TTP over carpal tunnel now.  No other tenderness. FROM wrist, digits without pain. Negative tinels and phalens. Sensation intact to light touch.    Assessment & Plan:  1. Bilateral carpal tunnel syndrome - left side improved with injection, bracing.  Right side with some improvement following prednisone, bracing, injection.  Advised to more regularly use brace on this side and see if this takes care of rest of symptoms.  Can consider repeat injection, NCVs/EMGs if not improving over next 6 weeks.

## 2015-02-26 ENCOUNTER — Other Ambulatory Visit: Payer: Self-pay | Admitting: Internal Medicine

## 2015-04-03 ENCOUNTER — Encounter: Payer: Self-pay | Admitting: Internal Medicine

## 2015-04-03 ENCOUNTER — Ambulatory Visit: Payer: 59 | Attending: Internal Medicine | Admitting: Internal Medicine

## 2015-04-03 VITALS — BP 118/86 | HR 80 | Temp 98.8°F | Resp 16 | Ht 66.0 in | Wt 239.4 lb

## 2015-04-03 DIAGNOSIS — I10 Essential (primary) hypertension: Secondary | ICD-10-CM | POA: Insufficient documentation

## 2015-04-03 DIAGNOSIS — G5603 Carpal tunnel syndrome, bilateral upper limbs: Secondary | ICD-10-CM

## 2015-04-03 DIAGNOSIS — Z79899 Other long term (current) drug therapy: Secondary | ICD-10-CM | POA: Insufficient documentation

## 2015-04-03 DIAGNOSIS — E785 Hyperlipidemia, unspecified: Secondary | ICD-10-CM | POA: Insufficient documentation

## 2015-04-03 DIAGNOSIS — J00 Acute nasopharyngitis [common cold]: Secondary | ICD-10-CM | POA: Diagnosis not present

## 2015-04-03 DIAGNOSIS — E119 Type 2 diabetes mellitus without complications: Secondary | ICD-10-CM

## 2015-04-03 DIAGNOSIS — Z7982 Long term (current) use of aspirin: Secondary | ICD-10-CM | POA: Diagnosis not present

## 2015-04-03 LAB — LIPID PANEL
Cholesterol: 126 mg/dL (ref 125–200)
HDL: 30 mg/dL — ABNORMAL LOW (ref >=40)
LDL Cholesterol: 75 mg/dL (ref <130)
Total CHOL/HDL Ratio: 4.2 Ratio (ref <=5.0)
Triglycerides: 104 mg/dL (ref <150)
VLDL: 21 mg/dL (ref <30)

## 2015-04-03 LAB — BASIC METABOLIC PANEL
BUN: 18 mg/dL (ref 7–25)
CALCIUM: 9.4 mg/dL (ref 8.6–10.3)
CO2: 24 mmol/L (ref 20–31)
CREATININE: 0.96 mg/dL (ref 0.60–1.35)
Chloride: 102 mmol/L (ref 98–110)
GLUCOSE: 155 mg/dL — AB (ref 65–99)
Potassium: 4.1 mmol/L (ref 3.5–5.3)
Sodium: 138 mmol/L (ref 135–146)

## 2015-04-03 LAB — GLUCOSE, POCT (MANUAL RESULT ENTRY): POC Glucose: 153 mg/dl — AB (ref 70–99)

## 2015-04-03 LAB — POCT GLYCOSYLATED HEMOGLOBIN (HGB A1C): Hemoglobin A1C: 9

## 2015-04-03 MED ORDER — LORATADINE 10 MG PO TABS: 10.0000 mg | ORAL_TABLET | Freq: Every day | ORAL | Status: DC

## 2015-04-03 MED ORDER — LISINOPRIL 10 MG PO TABS: 10.0000 mg | ORAL_TABLET | Freq: Every day | ORAL | Status: DC

## 2015-04-03 MED ORDER — BENZONATATE 100 MG PO CAPS: 100.0000 mg | ORAL_CAPSULE | Freq: Two times a day (BID) | ORAL | Status: DC | PRN

## 2015-04-03 MED ORDER — GABAPENTIN 300 MG PO CAPS: 300.0000 mg | ORAL_CAPSULE | Freq: Every day | ORAL | Status: DC

## 2015-04-03 NOTE — Patient Instructions (Addendum)
[facility] [place] [address] [place], [place] [address] Main: [phone] Locate on Map  I have sent Claritin to take to help with any allergy response that may be causing the prolonged cough. This will also help to dry up any head and chest congestion. Tessalon perles for cough as needed. Continue to use Flonase. If no better in 7 days call me back and I will send antibiotic therapy if needed. If you start to have chest pain, SOB, wheezing, fevers call back sooner.

## 2015-04-03 NOTE — Progress Notes (Signed)
Patient here for cough congestion and cold like symptoms for the Past two weeks Does not want it to turn into bronchitis

## 2015-04-03 NOTE — Progress Notes (Signed)
Patient ID: Scott [name], male   DOB: 02/20/1971, 44 y.o.   MRN: [id number]  CC: cold symptoms  HPI:  Scott [name] is a 44 y.o. male here today for a follow up visit.  Patient has past medical history of diabetes, HTN, DVT, and obesity. Patient reports that he still has not been exercising because of shoulder pain, back pain, and carpal tunnel pain. He states that he takes his medication daily but he does consume a fair amount of carbs daily. Been having symptoms of cough, congestion, headaches, sneezing for two weeks. He is afraid it may turn in to bronchitis and he does not want to be sick for the holidays. No fevers, chills, rhinitis, no mucous production. Unable to sleep at night from cough. Has tried theraflu without relief.   No Known Allergies Past Medical History  Diagnosis Date  . Diabetes mellitus   . Hypertension   . DVT (deep venous thrombosis) (HCC)   . Obesity    Current Outpatient Prescriptions on File Prior to Visit  Medication Sig Dispense Refill  . amLODipine (NORVASC) 5 MG tablet     . aspirin EC 81 MG tablet Take 81 mg by mouth daily.    [name]. atorvastatin (LIPITOR) 20 MG tablet Take 1 tablet (20 mg total) by mouth daily. 90 tablet 3  . fluticasone (FLONASE) 50 MCG/ACT nasal spray USE 2 SPRAYS IN EACH NOSTRIL ONCE DAILY 16 g 5  . fluticasone (FLONASE) 50 MCG/ACT nasal spray USE 2 SPRAYS IN EACH NOSTRIL ONCE DAILY 16 g 5  . gabapentin (NEURONTIN) 300 MG capsule Take 1 capsule (300 mg total) by mouth at bedtime. 30 capsule 5  . Linagliptin-Metformin HCl (JENTADUETO) 2.08-998 MG TABS Take 1 tablet by mouth daily. 60 tablet 5  . lisinopril (PRINIVIL,ZESTRIL) 10 MG tablet Take 1 tablet (10 mg total) by mouth daily. 60 tablet 4  . predniSONE (DELTASONE) 10 MG tablet 6 tabs po day 1, 5 tabs po day 2, 4 tabs po day 3, 3 tabs po day 4, 2 tabs po day 5, 1 tab po day 6 21 tablet 0   No current facility-administered medications on file prior to visit.   Family History   Problem Relation Age of Onset  . Hypertension Other   . Hyperlipidemia Other   . Diabetes Other   . Hypertension Mother   . Kidney disease Father    Social History   Social History  . Marital Status: Married    Spouse Name: N/A  . Number of Children: N/A  . Years of Education: N/A   Occupational History  . real estate agent    Social History Main Topics  . Smoking status: Never Smoker   . Smokeless tobacco: Never Used  . Alcohol Use: No  . Drug Use: No  . Sexual Activity: Not on file   Other Topics Concern  . Not on file   Social History Narrative    Review of Systems: Other than what is stated in HPI, all other systems are negative.   Objective:   Filed Vitals:   04/03/15 1051  BP: 140/88  Pulse: 80  Temp: 98.8 F (37.1 C)  Resp: 16    Physical Exam  Constitutional: He is oriented to person, place, and time.  HENT:  Right Ear: External ear normal.  Left Ear: External ear normal.  Mouth/Throat: No oropharyngeal exudate.  Eyes: Right eye exhibits no discharge. Left eye exhibits no discharge.  Cardiovascular: Normal rate, regular rhythm and normal heart  sounds.   Pulmonary/Chest: Effort normal and breath sounds normal. He has no wheezes.  Lymphadenopathy:    He has no cervical adenopathy.  Neurological: He is alert and oriented to person, place, and time.  Skin: Skin is warm and dry.  Psychiatric: He has a normal mood and affect.     Lab Results  Component Value Date   WBC 5.0 02/02/2014   HGB 16.2 02/02/2014   HCT 47.1 02/02/2014   MCV 75.1* 02/02/2014   PLT 202 02/02/2014   Lab Results  Component Value Date   CREATININE 1.14 12/02/2014   BUN 15 12/02/2014   NA 140 12/02/2014   K 4.3 12/02/2014   CL 99 12/02/2014   CO2 29 12/02/2014    Lab Results  Component Value Date   HGBA1C 9.0 04/03/2015   Lipid Panel     Component Value Date/Time   CHOL 197 08/05/2014 1245   TRIG 128 08/05/2014 1245   HDL 40 08/05/2014 1245   CHOLHDL 4.9  08/05/2014 1245   VLDL 26 08/05/2014 1245   LDLCALC 131* 08/05/2014 1245       Assessment and plan:   Scott [name] was seen today for uri.  Diagnoses and all orders for this visit:  Type 2 diabetes mellitus without complication, without long-term current use of insulin (HCC) -     Glucose (CBG) -     HgB A1c -     Amb Referral to Nutrition and Diabetic E -     Basic Metabolic Panel Patients diabetes remains uncontrolled as evidence by hemoglobin a1c >8.  Patient has been non-compliant with ADA diet recommendations Stressed the multiple complications associated with uncontrolled diabetes. I have sent a referral to diabetes nutrition. If patient does not have improvement in next 3 months then I will likely add Invokana.   Acute coryza -     loratadine (CLARITIN) 10 MG tablet; Take 1 tablet (10 mg total) by mouth daily.. -     benzonatate (TESSALON) 100 MG capsule; Take 1 capsule (100 mg total) by mouth 2 (two) times daily as needed for cough.  Symptom management  HTN (hypertension), benign -     lisinopril (PRINIVIL,ZESTRIL) 10 MG tablet; Take 1 tablet (10 mg total) by mouth daily.  Patient blood pressure is stable and may continue on current medication.  Education on diet, exercise, and modifiable risk factors discussed. Will obtain appropriate labs as needed. Will follow up in 3-6 months.   Hyperlipidemia -     Lipid panel Education provided on proper lifestyle changes in order to lower cholesterol. Patient advised to maintain healthy weight and to keep total fat intake at 25-35% of total calories and carbohydrates 50-60% of total daily calories. Explained how high cholesterol places patient at risk for heart disease. Patient placed on appropriate medication and repeat labs in 6 months   Bilateral carpal tunnel syndrome -     Refilled gabapentin (NEURONTIN) 300 MG capsule; Take 1 capsule (300 mg total) by mouth at bedtime. He will make follow up appointment with Orthopedics   Return  in about 3 months (around 07/02/2015) for DM/HTN.       Ambrose Finland, NP-C [facility] [phone] 04/03/2015, 10:52 AM

## 2015-04-04 ENCOUNTER — Telehealth: Payer: Self-pay

## 2015-04-04 NOTE — Telephone Encounter (Signed)
-----   Message from Ambrose FinlandValerie A Keck, NP sent at 04/04/2015  8:40 AM EST ----- Cholesterol looks great.

## 2015-04-04 NOTE — Telephone Encounter (Signed)
Returned patient phone call Call went right to voice mail  Message left to return our phone call

## 2015-04-04 NOTE — Telephone Encounter (Signed)
Pt. Returned the nurses call. Please f/u with pt. °

## 2015-04-04 NOTE — Telephone Encounter (Signed)
Spoke with patient and he is aware of his lab results 

## 2015-04-04 NOTE — Telephone Encounter (Signed)
Patient called and requested for an antibiotic to be called in for his chest congestion. Patient wanted to go ahead and start antibiotics to keep from the congestion getting worse. Please f/u with pt.

## 2015-04-07 ENCOUNTER — Telehealth: Payer: Self-pay

## 2015-04-07 NOTE — Telephone Encounter (Signed)
Returned patient phone call Patient states the Claritin and cough medication is not helping Patient is requesting an antibiotic Is this something you are willing to send to pharmacy

## 2015-04-08 ENCOUNTER — Telehealth: Payer: Self-pay

## 2015-04-08 ENCOUNTER — Other Ambulatory Visit: Payer: Self-pay | Admitting: Internal Medicine

## 2015-04-08 MED ORDER — AZITHROMYCIN 250 MG PO TABS: ORAL_TABLET | ORAL | Status: DC

## 2015-04-08 NOTE — Telephone Encounter (Signed)
i have sent medication

## 2015-04-08 NOTE — Telephone Encounter (Signed)
Attempted to contact patient to let him know an antibiotic was sent to [facility] [address] Patient not available Message left on voice mail to return our call

## 2015-04-24 ENCOUNTER — Other Ambulatory Visit: Payer: Self-pay | Admitting: Internal Medicine

## 2015-05-04 ENCOUNTER — Other Ambulatory Visit: Payer: Self-pay | Admitting: Internal Medicine

## 2015-05-07 ENCOUNTER — Ambulatory Visit (INDEPENDENT_AMBULATORY_CARE_PROVIDER_SITE_OTHER): Payer: 59 | Admitting: Family Medicine

## 2015-05-07 ENCOUNTER — Encounter: Payer: Self-pay | Admitting: Family Medicine

## 2015-05-07 VITALS — BP 136/92 | HR 80 | Ht 66.0 in | Wt 235.0 lb

## 2015-05-07 DIAGNOSIS — G5603 Carpal tunnel syndrome, bilateral upper limbs: Secondary | ICD-10-CM | POA: Diagnosis not present

## 2015-05-07 MED ORDER — GABAPENTIN 300 MG PO CAPS: 300.0000 mg | ORAL_CAPSULE | Freq: Three times a day (TID) | ORAL | Status: DC

## 2015-05-07 NOTE — Patient Instructions (Signed)
Take the gabapentin twice a day for a week then increase to three times a day - I sent more of this in for you. Do the shoulder rehab exercises 3 sets of 10 once a day for next 6 weeks. Continue wearing the wrist braces as often as possible. We will arrange for you to have the nerve conduction studies as well.

## 2015-05-08 NOTE — Assessment & Plan Note (Signed)
Unfortunately both wrists symptomatic now.  Discussed options - will increase gabapentin to three times a day.  Continue wrist braces.  Will go ahead with NCVs/EMGs.

## 2015-05-08 NOTE — Progress Notes (Signed)
PCP and referred by: Ambrose FinlandValerie A Keck, NP  Subjective:   HPI: Patient is a 45 y.o. male here for bilateral carpal tunnel syndrome.  8/16: Patient reports his current issues with wrists/hands started about 3-4 months ago. Pain volar but also dorsally right > left hands with numbness/tingling into middle 3 digits and some into thumbs. Trying gabapentin qhs without much difference. Pain in right 8/10, left 4/10.  9/19: Patient reports left hand improved, no issues. Right hand still having pain up to 10/10 especially with movement. Using braces on both sides. Can radiate up the arm at times.  10/19: Patient reports left hand still without any problems. Right hand some improvement - down to 5/10 level dull or sharp pain volar hand, wrist. Tingling at times but not lasting into middle 3 digits, some into thumb. Finished prednisone. Has been icing - no longer using brace regularly. No swelling. No skin changes, fever, other complaints.  05/07/15: Patient reports both of his hands are bothering him now. Right hand now worse than the left. Takes low dose gabapentin at nighttime. Been wearing wrist braces. Pain level 7/10 right, 5/10 left. Feels primarily volar wrists into his digits. Tingling in right hand middle 3rd digits. No swelling. No skin changes, fever, other complaints.  Past Medical History  Diagnosis Date  . Diabetes mellitus   . Hypertension   . DVT (deep venous thrombosis) (HCC)   . Obesity     Current Outpatient Prescriptions on File Prior to Visit  Medication Sig Dispense Refill  . amLODipine (NORVASC) 5 MG tablet     . aspirin EC 81 MG tablet Take 81 mg by mouth daily.    [name]. atorvastatin (LIPITOR) 20 MG tablet Take 1 tablet (20 mg total) by mouth daily. 90 tablet 3  . fluticasone (FLONASE) 50 MCG/ACT nasal spray USE 2 SPRAYS IN EACH NOSTRIL ONCE DAILY 16 g 5  . Linagliptin-Metformin HCl (JENTADUETO) 2.08-998 MG TABS Take 1 tablet by mouth daily. 60 tablet 5  .  lisinopril (PRINIVIL,ZESTRIL) 10 MG tablet Take 1 tablet (10 mg total) by mouth daily. 60 tablet 4  . loratadine (CLARITIN) 10 MG tablet Take 1 tablet (10 mg total) by mouth daily. 30 tablet 1   No current facility-administered medications on file prior to visit.    Past Surgical History  Procedure Laterality Date  . Rotator cuff repair      No Known Allergies  Social History   Social History  . Marital Status: Married    Spouse Name: N/A  . Number of Children: N/A  . Years of Education: N/A   Occupational History  . real estate agent    Social History Main Topics  . Smoking status: Never Smoker   . Smokeless tobacco: Never Used  . Alcohol Use: No  . Drug Use: No  . Sexual Activity: Not on file   Other Topics Concern  . Not on file   Social History Narrative    Family History  Problem Relation Age of Onset  . Hypertension Other   . Hyperlipidemia Other   . Diabetes Other   . Hypertension Mother   . Kidney disease Father     BP 136/92 mmHg  Pulse 80  Ht 5\' 6"  (1.676 m)  Wt 235 lb (106.595 kg)  BMI 37.95 kg/m2  Review of Systems: See HPI above.    Objective:  Physical Exam:  Gen: NAD  Right wrist/hand: No gross deformity, swelling, bruising, atrophy. Mild TTP carpal tunnel.  No TTP  dorsal wrist joint.  No other tenderness. Mild limitation extension of wrist with minimal pain. Negative tinels. Positive phalens. Sensation intact to light touch.  Left wrist/hand: No gross deformity, swelling, bruising, atrophy. Mild TTP over carpal tunnel.  No other tenderness. FROM wrist, digits without pain. Negative tinels and phalens. Sensation intact to light touch.    Assessment & Plan:  1. Bilateral carpal tunnel syndrome - Unfortunately both wrists symptomatic now.  Discussed options - will increase gabapentin to three times a day.  Continue wrist braces.  Will go ahead with NCVs/EMGs.

## 2015-05-09 NOTE — Addendum Note (Signed)
Addended by: Kathi SimpersWISE, Dequarius Jeffries F on: 05/09/2015 08:19 AM   Modules accepted: Orders

## 2015-05-13 ENCOUNTER — Ambulatory Visit (INDEPENDENT_AMBULATORY_CARE_PROVIDER_SITE_OTHER): Payer: 59 | Admitting: Neurology

## 2015-05-13 ENCOUNTER — Encounter: Payer: Self-pay | Admitting: Neurology

## 2015-05-13 VITALS — BP 140/94 | HR 80 | Ht 66.0 in | Wt 239.0 lb

## 2015-05-13 DIAGNOSIS — G5603 Carpal tunnel syndrome, bilateral upper limbs: Secondary | ICD-10-CM | POA: Diagnosis not present

## 2015-05-13 DIAGNOSIS — E1141 Type 2 diabetes mellitus with diabetic mononeuropathy: Secondary | ICD-10-CM

## 2015-05-13 DIAGNOSIS — R202 Paresthesia of skin: Secondary | ICD-10-CM | POA: Diagnosis not present

## 2015-05-13 NOTE — Progress Notes (Signed)
[name] DOB: [birth year]  Chief Complaint  Patient presents with  . Carpal Tunnel    He is reporting bilateral wrist pain for the last five months.  He has tried wrist splints and injections with only minimal improvement.  . Shoulder Pain    Bilateral shoulder pain has also been present for the last five months.  Denies neck pain.     HISTORICAL  Scott [name] is a 45 years old right-handed male, seen in refer by his primary care Ambrose Finland in May 13 2015 for evaluation of bilateral hands paresthesia arm and shoulder pain  He has history of diabetes since 2002, hypertension,hyperlipidemia, history of right rotator cuff surgery in 2002.  Since summer of 2016, he noticed bilateral fingertips paresthesia, involving first 4 fingers, right more than left, sometimes he woke up in the middle of the night noticed dense numbness of right hand, difficulty using his right hand, he has been taking gabapentin 300 mg 3 times a day without helping his symptoms, he also used bilateral wrist splint, local injection without improving his symptoms either,  He denies significant neck pain, had a history of bilateral rotator cuff syndrome in the past, had right rotator cuff surgery in 2002   REVIEW OF SYSTEMS: Full 14 system review of systems performed and notable only for cough, achy muscles  ALLERGIES: No Known Allergies  HOME MEDICATIONS: Current Outpatient Prescriptions  Medication Sig Dispense Refill  . aspirin EC 81 MG tablet Take 81 mg by mouth daily.    [name] atorvastatin (LIPITOR) 20 MG tablet Take 1 tablet (20 mg total) by mouth daily. 90 tablet 3  . fluticasone (FLONASE) 50 MCG/ACT nasal spray USE 2 SPRAYS IN EACH NOSTRIL ONCE DAILY 16 g 5  . gabapentin (NEURONTIN) 300 MG capsule Take 1 capsule (300 mg total) by mouth 3 (three) times daily. 90 capsule 1  . Linagliptin-Metformin HCl (JENTADUETO) 2.08-998 MG TABS Take 1 tablet by mouth daily. 60 tablet 5  .  lisinopril (PRINIVIL,ZESTRIL) 10 MG tablet Take 1 tablet (10 mg total) by mouth daily. 60 tablet 4  . loratadine (CLARITIN) 10 MG tablet Take 1 tablet (10 mg total) by mouth daily. 30 tablet 1   No current facility-administered medications for this visit.    PAST MEDICAL HISTORY: Past Medical History  Diagnosis Date  . Diabetes mellitus   . Hypertension   . DVT (deep venous thrombosis) (HCC)   . Obesity   . Carpal tunnel syndrome   . Shoulder pain, bilateral     PAST SURGICAL HISTORY: Past Surgical History  Procedure Laterality Date  . Rotator cuff repair      FAMILY HISTORY: Family History  Problem Relation Age of Onset  . Hypertension Other   . Hyperlipidemia Other   . Diabetes Other   . Hypertension Mother   . Kidney disease Father     SOCIAL HISTORY:  Social History   Social History  . Marital Status: Married    Spouse Name: N/A  . Number of Children: N/A  . Years of Education: 12+   Occupational History  . real estate agent    Social History Main Topics  . Smoking status: Never Smoker   . Smokeless tobacco: Never Used  . Alcohol Use: No  . Drug Use: No  . Sexual Activity: Not on file   Other Topics Concern  . Not on file   Social History Narrative   Lives at home.   Right-handed.  Occasional caffeine use.     PHYSICAL EXAM   Filed Vitals:   05/13/15 1219  BP: 140/94  Pulse: 80  Height: 5\' 6"  (1.676 m)  Weight: 239 lb (108.41 kg)    Not recorded      Body mass index is 38.59 kg/(m^2).  PHYSICAL EXAMNIATION:  Gen: NAD, conversant, well nourised, obese, well groomed                     Cardiovascular: Regular rate rhythm, no peripheral edema, warm, nontender. Eyes: Conjunctivae clear without exudates or hemorrhage Neck: Supple, no carotid bruise. Pulmonary: Clear to auscultation bilaterally   NEUROLOGICAL EXAM:  MENTAL STATUS: Speech:    Speech is normal; fluent and spontaneous with normal comprehension.  Cognition:      Orientation to time, place and person     Normal recent and remote memory     Normal Attention span and concentration     Normal Language, naming, repeating,spontaneous speech     Fund of knowledge   CRANIAL NERVES: CN II: Visual fields are full to confrontation. Fundoscopic exam is normal with sharp discs and no vascular changes. Pupils are round equal and briskly reactive to light. CN III, IV, VI: extraocular movement are normal. No ptosis. CN V: Facial sensation is intact to pinprick in all 3 divisions bilaterally. Corneal responses are intact.  CN VII: Face is symmetric with normal eye closure and smile. CN VIII: Hearing is normal to rubbing fingers CN IX, X: Palate elevates symmetrically. Phonation is normal. CN XI: Head turning and shoulder shrug are intact CN XII: Tongue is midline with normal movements and no atrophy.  MOTOR: There is no pronator drift of out-stretched arms. Muscle bulk and tone are normal. Muscle strength is normal.  REFLEXES: Reflexes are 2+ and symmetric at the biceps, triceps, knees, and ankles. Plantar responses are flexor.  SENSORY: Intact to light touch, pinprick, position sense, and vibration sense are intact in fingers and toes, with exception of decreased light touch pinprick at the bilateral first 3 fingerpads   COORDINATION: Rapid alternating movements and fine finger movements are intact. There is no dysmetria on finger-to-nose and heel-knee-shin.    GAIT/STANCE: Posture is normal. Gait is steady with normal steps, base, arm swing, and turning. Heel and toe walking are normal. Tandem gait is normal.  Romberg is absent.   DIAGNOSTIC DATA (LABS, IMAGING, TESTING) - I reviewed patient records, labs, notes, testing and imaging myself where available.   ASSESSMENT AND PLAN  Scott [name] is a 45 y.o. male    Bilateral hands paresthesia  Most consistent with carpal tunnel syndromes  Proceed with EMG nerve conduction study  Continue  gabapentin 300 mg 3 times a day  Long history of diabetes,   Levert FeinsteinYijun Jannat Rosemeyer, M.D. Ph.D.  [facility] [address], [address] [place], [place] [address] Ph: [phone] Fax: [phone]  CC: Ambrose FinlandValerie A Keck, NP

## 2015-05-14 ENCOUNTER — Encounter: Payer: 59 | Attending: Internal Medicine | Admitting: *Deleted

## 2015-05-14 ENCOUNTER — Ambulatory Visit (INDEPENDENT_AMBULATORY_CARE_PROVIDER_SITE_OTHER): Payer: Self-pay | Admitting: Neurology

## 2015-05-14 ENCOUNTER — Ambulatory Visit (INDEPENDENT_AMBULATORY_CARE_PROVIDER_SITE_OTHER): Payer: 59 | Admitting: Neurology

## 2015-05-14 ENCOUNTER — Encounter: Payer: Self-pay | Admitting: *Deleted

## 2015-05-14 VITALS — Ht 66.0 in | Wt 240.0 lb

## 2015-05-14 DIAGNOSIS — Z713 Dietary counseling and surveillance: Secondary | ICD-10-CM | POA: Diagnosis not present

## 2015-05-14 DIAGNOSIS — R202 Paresthesia of skin: Secondary | ICD-10-CM

## 2015-05-14 DIAGNOSIS — E118 Type 2 diabetes mellitus with unspecified complications: Secondary | ICD-10-CM | POA: Diagnosis not present

## 2015-05-14 DIAGNOSIS — G5603 Carpal tunnel syndrome, bilateral upper limbs: Secondary | ICD-10-CM

## 2015-05-14 DIAGNOSIS — Z0289 Encounter for other administrative examinations: Secondary | ICD-10-CM

## 2015-05-14 NOTE — Patient Instructions (Signed)
Plan:  Aim for 4 Carb Choices per meal (60  grams) +/- 1 either way  Aim for 0-2 Carbs per snack if hungry  Include protein in moderation with your meals and snacks Consider checking BG at alternate times per day   Continue taking diabetes medication as directed by MD

## 2015-05-14 NOTE — Procedures (Signed)
   NCS (NERVE CONDUCTION STUDY) WITH EMG (ELECTROMYOGRAPHY) REPORT   STUDY DATE: Jan 11th 2017 PATIENT NAME: Scott DerrickKenneth W Mckiddy DOB: 08/24/1970 MRN: [id number]    TECHNOLOGIST: Gearldine ShownLorraine Jones ELECTROMYOGRAPHER: Levert FeinsteinYan, Achsah Mcquade M.D.  CLINICAL INFORMATION:  45 years old male presenting with bilateral hands paresthesia, mainly involving forced 3 fingers, right worse than left.  FINDINGS: NERVE CONDUCTION STUDY: Bilateral ulnar sensory and motor responses were normal. Bilateral median sensory response showed moderately prolonged peak latency, right worse than left, with well preserved snap amplitude. Bilateral median motor responses showed moderately prolonged distal latency, F-wave latency, right worse than left, with normal C map amplitude, conduction velocity.    NEEDLE ELECTROMYOGRAPHY: Selected needle examination was performed at right upper extremity muscles, bilateral abductor pollicis brevis, right cervical paraspinal muscles.  Bilateral abductor pollicis brevis: Normal insertion activity, no spontaneous activity, normal morphology motor unit potential, with mildly decreased recruitment patterns.  Needle examination of right pronator teres, biceps, triceps, deltoid was normal.  There was no spontaneous activity at right cervical paraspinal muscles, right C5, 6 and 7.  IMPRESSION:   This is an abnormal study. There is electrodiagnostic evidence of bilateral median neuropathy across the wrist, consistent with moderate bilateral carpal tunnel syndromes. There is no evidence of right cervical radiculopathy.    INTERPRETING PHYSICIAN:   Levert FeinsteinYan, Shakeila Pfarr M.D. Ph.D. [facility] [address], [address] [place], [place] [address] [phone]

## 2015-05-14 NOTE — Progress Notes (Signed)
Electrodiagnostic study has demonstrate moderate bilateral carpal tunnel syndromes, right worse than left. There is no evidence of right cervical radiculopathy.  I have advised him to wear bilateral wrist splint, when necessary NSAIDs, gabapentin 300 mg 3 times a day.

## 2015-05-16 NOTE — Progress Notes (Signed)
Diabetes Self-Management Education  Visit Type: First/Initial  Appt. Start Time: 1000 Appt. End Time: 1130  05/16/2015  [name], identified by name and date of birth, is a 45 y.o. male with a diagnosis of Diabetes: Type 2.   ASSESSMENT  Height 5\' 6"  (1.676 m), weight 240 lb (108.863 kg). Body mass index is 38.76 kg/(m^2).      Diabetes Self-Management Education - 05/14/15 1025    Visit Information   Visit Type First/Initial   Initial Visit   Diabetes Type Type 2   Are you currently following a meal plan? No   Are you taking your medications as prescribed? Yes   Date Diagnosed 1999   Health Coping   How would you rate your overall health? Fair   Psychosocial Assessment   Patient Belief/Attitude about Diabetes Motivated to manage diabetes   Self-care barriers None   Self-management support Family   Other persons present Patient   Patient Concerns Glycemic Control;Weight Control   Preferred Learning Style No preference indicated   How often do you need to have someone help you when you read instructions, pamphlets, or other written materials from your doctor or pharmacy? 1 - Never   What is the last grade level you completed in school? 12 th grade   Complications   Last HgB A1C per patient/outside source 9 %   How often do you check your blood sugar? Patient declines  running out of strips, have an old meter   Have you had a dilated eye exam in the past 12 months? Yes   Have you had a dental exam in the past 12 months? Yes   Are you checking your feet? Yes   How many days per week are you checking your feet? 1   Dietary Intake   Breakfast skips usually   Snack (morning) no   Lunch eats out usually: fast food chicken sandwich or burger, occasionally fries OR chicken with pasta and vegetables, bread   Snack (afternoon) not usually   Dinner wife prepares dinner meal: meat, starch, vegetables, occasionally salad with creamy dressing   Snack (evening) fresh fruit  OR small dessert like ice cream occasionally   Beverage(s) unsweet tiea with Splenda, water with lemon, hot tea, diet tea, occasionally diet soda   Exercise   Exercise Type ADL's   How many days per week to you exercise? 0   How many minutes per day do you exercise? 0   Total minutes per week of exercise 0   Patient Education   Previous Diabetes Education Yes (please comment)  Core Class 4 years ago   Disease state  Definition of diabetes, type 1 and 2, and the diagnosis of diabetes   Nutrition management  Role of diet in the treatment of diabetes and the relationship between the three main macronutrients and blood glucose level;Food label reading, portion sizes and measuring food.;Carbohydrate counting   Physical activity and exercise  Role of exercise on diabetes management, blood pressure control and cardiac health.   Medications Other (comment)   Monitoring Purpose and frequency of SMBG.   Chronic complications Relationship between chronic complications and blood glucose control   Psychosocial adjustment Role of stress on diabetes   Individualized Goals (developed by patient)   Nutrition Follow meal plan discussed   Physical Activity --  Plan to diacuss at next visit   Medications Not Applicable   Monitoring  Other (comment)  consider testing a couple times a week   Reducing Risk  examine blood glucose patterns   Outcomes   Expected Outcomes Demonstrated interest in learning. Expect positive outcomes   Future DMSE 3-4 months   Program Status Not Completed      Individualized Plan for Diabetes Self-Management Training:   Learning Objective:  Patient will have a greater understanding of diabetes self-management. Patient education plan is to attend individual and/or group sessions per assessed needs and concerns.   Plan:   Patient Instructions  Plan:  Aim for 4 Carb Choices per meal (60  grams) +/- 1 either way  Aim for 0-2 Carbs per snack if hungry  Include protein in  moderation with your meals and snacks Consider checking BG at alternate times per day   Continue taking diabetes medication as directed by MD      Expected Outcomes:  Demonstrated interest in learning. Expect positive outcomes  Education material provided: Living Well with Diabetes, A1C conversion sheet, Meal plan card and Carbohydrate counting sheet  If problems or questions, patient to contact team via:  Phone and Email  Future DSME appointment: 3-4 months

## 2015-05-21 ENCOUNTER — Other Ambulatory Visit: Payer: Self-pay | Admitting: Internal Medicine

## 2015-05-21 ENCOUNTER — Telehealth: Payer: Self-pay | Admitting: Internal Medicine

## 2015-05-21 DIAGNOSIS — R059 Cough, unspecified: Secondary | ICD-10-CM

## 2015-05-21 DIAGNOSIS — R05 Cough: Secondary | ICD-10-CM

## 2015-05-21 NOTE — Telephone Encounter (Signed)
Pt. Is still experiencing cough and discomfort and would like an x-ray to see if there is any fluid in his lungs.Scott KitchenMarland KitchenMarland KitchenMarland Kitchenplease follow up with patient

## 2015-05-22 ENCOUNTER — Telehealth: Payer: Self-pay

## 2015-05-22 ENCOUNTER — Ambulatory Visit (HOSPITAL_COMMUNITY)
Admission: RE | Admit: 2015-05-22 | Discharge: 2015-05-22 | Disposition: A | Discharge status: Home / Self Care | Payer: 59 | Source: Ambulatory Visit | Attending: Internal Medicine | Admitting: Internal Medicine

## 2015-05-22 DIAGNOSIS — R05 Cough: Secondary | ICD-10-CM | POA: Insufficient documentation

## 2015-05-22 DIAGNOSIS — R059 Cough, unspecified: Secondary | ICD-10-CM

## 2015-05-22 DIAGNOSIS — G5603 Carpal tunnel syndrome, bilateral upper limbs: Secondary | ICD-10-CM

## 2015-05-22 NOTE — Telephone Encounter (Signed)
Spoke with patient this am and he is aware to go over to [facility] -radiology department to have his chest x ray done

## 2015-05-23 ENCOUNTER — Telehealth: Payer: Self-pay

## 2015-05-23 NOTE — Telephone Encounter (Signed)
Returned patient phone call  Patient viewed his x ray results on my chart and is  Wondering what he can take for his congestion Patient stated he has been taking mucinex but it if not helping

## 2015-05-23 NOTE — Telephone Encounter (Signed)
Pt. Called requesting to speak to PCP regarding his health. Pt. Stated he had x-rays done and that his lung are clear but he would like to be prescribed a Rx for his congestive. Please f/u with pt.

## 2015-05-23 NOTE — Telephone Encounter (Signed)
Returned call to patient and suggested that he can try  A humidifier or possibly vicks vaporizer to see if that helps with  His congestion Instructed patient if no change by Monday to give a call back

## 2015-05-23 NOTE — Telephone Encounter (Signed)
-----   Message from Ambrose Finland, NP sent at 05/23/2015 10:28 AM EST ----- Normal chest xray. Does he think cough is related to Lisinopril---it can cause a cough after restarting if he was ever off for a period of time. If not it has likely turned into a bronchitis, any other symptoms?

## 2015-05-26 NOTE — Telephone Encounter (Signed)
Can try Claritin D and get a humidifier.

## 2015-05-28 ENCOUNTER — Telehealth: Payer: Self-pay

## 2015-05-28 NOTE — Telephone Encounter (Signed)
Called patient this am  Patient is aware he can try some claritan D and a humidifer

## 2015-05-29 ENCOUNTER — Ambulatory Visit (INDEPENDENT_AMBULATORY_CARE_PROVIDER_SITE_OTHER): Payer: 59 | Admitting: Family Medicine

## 2015-05-29 ENCOUNTER — Encounter: Payer: Self-pay | Admitting: Family Medicine

## 2015-05-29 ENCOUNTER — Ambulatory Visit: Payer: 59 | Admitting: Family Medicine

## 2015-05-29 VITALS — BP 137/92 | HR 90 | Ht 66.0 in | Wt 238.0 lb

## 2015-05-29 DIAGNOSIS — G5603 Carpal tunnel syndrome, bilateral upper limbs: Secondary | ICD-10-CM | POA: Diagnosis not present

## 2015-05-29 MED ORDER — METHYLPREDNISOLONE ACETATE 40 MG/ML IJ SUSP
40.0000 mg | Freq: Once | INTRAMUSCULAR | Status: AC
  Administered 2015-05-29: 40 mg via INTRA_ARTICULAR

## 2015-06-02 NOTE — Progress Notes (Signed)
PCP and referred by: Ambrose Finland, NP  Subjective:   HPI: Patient is a 45 y.o. male here for bilateral carpal tunnel syndrome.  8/16: Patient reports his current issues with wrists/hands started about 3-4 months ago. Pain volar but also dorsally right > left hands with numbness/tingling into middle 3 digits and some into thumbs. Trying gabapentin qhs without much difference. Pain in right 8/10, left 4/10.  9/19: Patient reports left hand improved, no issues. Right hand still having pain up to 10/10 especially with movement. Using braces on both sides. Can radiate up the arm at times.  10/19: Patient reports left hand still without any problems. Right hand some improvement - down to 5/10 level dull or sharp pain volar hand, wrist. Tingling at times but not lasting into middle 3 digits, some into thumb. Finished prednisone. Has been icing - no longer using brace regularly. No swelling. No skin changes, fever, other complaints.  05/07/15: Patient reports both of his hands are bothering him now. Right hand now worse than the left. Takes low dose gabapentin at nighttime. Been wearing wrist braces. Pain level 7/10 right, 5/10 left. Feels primarily volar wrists into his digits. Tingling in right hand middle 3rd digits. No swelling. No skin changes, fever, other complaints.  05/29/15: Patient returns for bilateral carpal tunnel injections. NCVs confirmed moderate bilateral carpal tunnel. Pain level 8/10 with numbness as noted previously into digits volar right worse than left. No skin changes, fever, other complaints.  Past Medical History  Diagnosis Date  . Diabetes mellitus   . Hypertension   . DVT (deep venous thrombosis) (HCC)   . Obesity   . Carpal tunnel syndrome   . Shoulder pain, bilateral     Current Outpatient Prescriptions on File Prior to Visit  Medication Sig Dispense Refill  . aspirin EC 81 MG tablet Take 81 mg by mouth daily.    [name] atorvastatin (LIPITOR)  20 MG tablet Take 1 tablet (20 mg total) by mouth daily. 90 tablet 3  . fluticasone (FLONASE) 50 MCG/ACT nasal spray USE 2 SPRAYS IN EACH NOSTRIL ONCE DAILY 16 g 5  . gabapentin (NEURONTIN) 300 MG capsule Take 1 capsule (300 mg total) by mouth 3 (three) times daily. 90 capsule 1  . Linagliptin-Metformin HCl (JENTADUETO) 2.08-998 MG TABS Take 1 tablet by mouth daily. 60 tablet 5  . lisinopril (PRINIVIL,ZESTRIL) 10 MG tablet Take 1 tablet (10 mg total) by mouth daily. 60 tablet 4  . loratadine (CLARITIN) 10 MG tablet Take 1 tablet (10 mg total) by mouth daily. 30 tablet 1   No current facility-administered medications on file prior to visit.    Past Surgical History  Procedure Laterality Date  . Rotator cuff repair      No Known Allergies  Social History   Social History  . Marital Status: Married    Spouse Name: N/A  . Number of Children: N/A  . Years of Education: 12+   Occupational History  . real estate agent    Social History Main Topics  . Smoking status: Never Smoker   . Smokeless tobacco: Never Used  . Alcohol Use: No  . Drug Use: No  . Sexual Activity: Not on file   Other Topics Concern  . Not on file   Social History Narrative   Lives at home.   Right-handed.   Occasional caffeine use.    Family History  Problem Relation Age of Onset  . Hypertension Other   . Hyperlipidemia Other   .  Diabetes Other   . Hypertension Mother   . Kidney disease Father     BP 137/92 mmHg  Pulse 90  Ht  (1.676 m)  Wt 238 lb (107.956 kg)  BMI 38.43 kg/m2  Review of Systems: See HPI above.    Objective:  Physical Exam:  Gen: NAD, comfortable in exam room.  Right wrist/hand: No gross deformity, swelling, bruising, atrophy. Mild TTP carpal tunnel.  No TTP dorsal wrist joint.  No other tenderness. Mild limitation extension of wrist with minimal pain. Negative tinels. Positive phalens. Sensation intact to light touch.  Left wrist/hand: No gross deformity,  swelling, bruising, atrophy. Mild TTP over carpal tunnel.  No other tenderness. FROM wrist, digits without pain. Negative tinels and phalens. Sensation intact to light touch.    Assessment & Plan:  1. Bilateral carpal tunnel syndrome - Worsening, both wrists symptomatic now.  Confirmed with NCVs/EMGs to be moderate.  Repeat injections given today.  Taking gabapentin as well.  Continue wrist braces.  After informed written consent patient was seated on exam table.  Area overlying right carpal tunnel prepped with alcohol swab after ultrasound used to identify between median nerve and ulnar artery then right carpal tunnel injected with 2:1 marcaine: depomedrol.  Patient tolerated procedure well without immediate complications.  After informed written consent patient was seated on exam table.  Area overlying left carpal tunnel prepped with alcohol swab after ultrasound used to identify between median nerve and ulnar artery then left carpal tunnel injected with 2:1 marcaine: depomedrol.  Patient tolerated procedure well without immediate complications.

## 2015-06-02 NOTE — Assessment & Plan Note (Signed)
Both wrists symptomatic now.  Confirmed with NCVs/EMGs to be moderate.  Repeat injections given today.  Taking gabapentin as well.  Continue wrist braces.  After informed written consent patient was seated on exam table.  Area overlying right carpal tunnel prepped with alcohol swab after ultrasound used to identify between median nerve and ulnar artery then right carpal tunnel injected with 2:1 marcaine: depomedrol.  Patient tolerated procedure well without immediate complications.  After informed written consent patient was seated on exam table.  Area overlying left carpal tunnel prepped with alcohol swab after ultrasound used to identify between median nerve and ulnar artery then left carpal tunnel injected with 2:1 marcaine: depomedrol.  Patient tolerated procedure well without immediate complications.

## 2015-06-06 ENCOUNTER — Telehealth: Payer: Self-pay

## 2015-06-06 NOTE — Telephone Encounter (Signed)
Pt. Called stating that the nurse advised him to take Claritin D and pt. Took the medication for his congestion and he is still congested . Please f/u with pt.[name]

## 2015-06-06 NOTE — Telephone Encounter (Signed)
Returned patient phone call Patient states after taking the caritin D he is still concerned about his congestion Since his chest x ray was also negative i suggested he make an appointment to  Be re evaluated Call transferred to front desk to schedule

## 2015-06-11 ENCOUNTER — Ambulatory Visit: Payer: 59 | Admitting: Internal Medicine

## 2015-06-18 ENCOUNTER — Ambulatory Visit: Payer: 59 | Admitting: Internal Medicine

## 2015-06-25 ENCOUNTER — Ambulatory Visit: Payer: 59 | Attending: Internal Medicine | Admitting: Internal Medicine

## 2015-06-25 ENCOUNTER — Encounter: Payer: Self-pay | Admitting: Internal Medicine

## 2015-06-25 VITALS — BP 139/94 | HR 73 | Temp 98.0°F | Resp 16 | Ht 66.0 in | Wt 237.0 lb

## 2015-06-25 DIAGNOSIS — Z86711 Personal history of pulmonary embolism: Secondary | ICD-10-CM | POA: Insufficient documentation

## 2015-06-25 DIAGNOSIS — I10 Essential (primary) hypertension: Secondary | ICD-10-CM | POA: Insufficient documentation

## 2015-06-25 DIAGNOSIS — G5600 Carpal tunnel syndrome, unspecified upper limb: Secondary | ICD-10-CM | POA: Insufficient documentation

## 2015-06-25 DIAGNOSIS — F419 Anxiety disorder, unspecified: Secondary | ICD-10-CM | POA: Diagnosis not present

## 2015-06-25 DIAGNOSIS — E119 Type 2 diabetes mellitus without complications: Secondary | ICD-10-CM | POA: Diagnosis not present

## 2015-06-25 DIAGNOSIS — R0602 Shortness of breath: Secondary | ICD-10-CM

## 2015-06-25 DIAGNOSIS — R0989 Other specified symptoms and signs involving the circulatory and respiratory systems: Secondary | ICD-10-CM | POA: Diagnosis present

## 2015-06-25 DIAGNOSIS — R0981 Nasal congestion: Secondary | ICD-10-CM | POA: Diagnosis not present

## 2015-06-25 DIAGNOSIS — Z7982 Long term (current) use of aspirin: Secondary | ICD-10-CM | POA: Diagnosis not present

## 2015-06-25 DIAGNOSIS — E669 Obesity, unspecified: Secondary | ICD-10-CM | POA: Diagnosis not present

## 2015-06-25 DIAGNOSIS — Z79899 Other long term (current) drug therapy: Secondary | ICD-10-CM | POA: Diagnosis not present

## 2015-06-25 MED ORDER — PREDNISONE 20 MG PO TABS: 20.0000 mg | ORAL_TABLET | Freq: Every day | ORAL | Status: DC

## 2015-06-25 NOTE — Progress Notes (Signed)
Patient here for follow up on his chest congestion Patient states he has tried claritan and mucinex and still  Having a lot of congestion in his chest Recent chest x ray was negative

## 2015-06-25 NOTE — Progress Notes (Signed)
Patient ID: Scott [name], male   DOB: 06/04/1970, 45 y.o.   MRN: [id number]  CC: chest congestion  HPI: Scott [name] is a 45 y.o. male here today for a follow up visit.  Patient has past medical history of diabetes, HTN, past PE, and carpel tunnel syndrome. Patient was evaluated here 2 months ago for symptoms of cough, chest and nasal congestion. Patient reports that he has continued to have symptoms of chest congestion even while using Claritin D and Mucinex. He often feels SOB with a "warm sensation" in his chest. He admits to feeling anxious like his heart is beating fast. He denies any complaints of leg swelling, diaphoresis, or calf pain. Patient states that he is worried his pulmonary Emboli has returned because he has never had chest discomfort this long.   No Known Allergies Past Medical History  Diagnosis Date  . Diabetes mellitus   . Hypertension   . DVT (deep venous thrombosis) (HCC)   . Obesity   . Carpal tunnel syndrome   . Shoulder pain, bilateral    Current Outpatient Prescriptions on File Prior to Visit  Medication Sig Dispense Refill  . aspirin EC 81 MG tablet Take 81 mg by mouth daily.    [name] atorvastatin (LIPITOR) 20 MG tablet Take 1 tablet (20 mg total) by mouth daily. 90 tablet 3  . fluticasone (FLONASE) 50 MCG/ACT nasal spray USE 2 SPRAYS IN EACH NOSTRIL ONCE DAILY 16 g 5  . gabapentin (NEURONTIN) 300 MG capsule Take 1 capsule (300 mg total) by mouth 3 (three) times daily. 90 capsule 1  . Linagliptin-Metformin HCl (JENTADUETO) 2.08-998 MG TABS Take 1 tablet by mouth daily. 60 tablet 5  . lisinopril (PRINIVIL,ZESTRIL) 10 MG tablet Take 1 tablet (10 mg total) by mouth daily. 60 tablet 4  . loratadine (CLARITIN) 10 MG tablet Take 1 tablet (10 mg total) by mouth daily. 30 tablet 1   No current facility-administered medications on file prior to visit.   Family History  Problem Relation Age of Onset  . Hypertension Other   . Hyperlipidemia Other   . Diabetes  Other   . Hypertension Mother   . Kidney disease Father    Social History   Social History  . Marital Status: Married    Spouse Name: N/A  . Number of Children: N/A  . Years of Education: 12+   Occupational History  . real estate agent    Social History Main Topics  . Smoking status: Never Smoker   . Smokeless tobacco: Never Used  . Alcohol Use: No  . Drug Use: No  . Sexual Activity: Not on file   Other Topics Concern  . Not on file   Social History Narrative   Lives at home.   Right-handed.   Occasional caffeine use.    Review of Systems: Other than what is stated in HPI, all other systems are negative.   Objective:   Filed Vitals:   06/25/15 1127  BP: 139/94  Pulse: 73  Temp: 98 F (36.7 C)  Resp: 16    Physical Exam  Constitutional: He is oriented to person, place, and time. No distress.  Cardiovascular: Normal rate, regular rhythm and normal heart sounds.   Pulmonary/Chest: Effort normal and breath sounds normal. He exhibits no tenderness.  Musculoskeletal: He exhibits no edema.  Negative Homan's. No calf tenderness  Neurological: He is alert and oriented to person, place, and time.  Skin: Skin is warm. He is not diaphoretic.  Lab Results  Component Value Date   WBC 5.0 02/02/2014   HGB 16.2 02/02/2014   HCT 47.1 02/02/2014   MCV 75.1* 02/02/2014   PLT 202 02/02/2014   Lab Results  Component Value Date   CREATININE 0.96 04/03/2015   BUN 18 04/03/2015   NA 138 04/03/2015   K 4.1 04/03/2015   CL 102 04/03/2015   CO2 24 04/03/2015    Lab Results  Component Value Date   HGBA1C 9.0 04/03/2015   Lipid Panel     Component Value Date/Time   CHOL 126 04/03/2015 1134   TRIG 104 04/03/2015 1134   HDL 30* 04/03/2015 1134   CHOLHDL 4.2 04/03/2015 1134   VLDL 21 04/03/2015 1134   LDLCALC 75 04/03/2015 1134       Assessment and plan:   Kriss was seen today for nasal congestion.  Diagnoses and all orders for this visit:  SOB  (shortness of breath) -     CT Angio Chest W/Cm &/Or Wo Cm; Future -     Basic Metabolic Panel Patient is not providing sufficient history. I do not feel he has chest congestion because he denies mucous production and his lungs sound clear. He has been treated with antibiotics in the past and antihistamines. I do feel he is likely having some anxiety but I will r/o PE due to his past history of unprovoked PE. I will follow up with patient after CT scan Explained signs and symptoms that should warrant immediate attention.  Patient verbalized understanding with teach back used.     Return if symptoms worsen or fail to improve.   Ambrose Finland, NP-C [facility] [phone] 06/25/2015, 11:40 AM

## 2015-06-26 LAB — BASIC METABOLIC PANEL
BUN: 16 mg/dL (ref 7–25)
CHLORIDE: 102 mmol/L (ref 98–110)
CO2: 22 mmol/L (ref 20–31)
CREATININE: 1.01 mg/dL (ref 0.60–1.35)
Calcium: 9.1 mg/dL (ref 8.6–10.3)
Glucose, Bld: 141 mg/dL — ABNORMAL HIGH (ref 65–99)
Potassium: 4.4 mmol/L (ref 3.5–5.3)
Sodium: 138 mmol/L (ref 135–146)

## 2015-06-30 ENCOUNTER — Telehealth: Payer: Self-pay

## 2015-06-30 NOTE — Telephone Encounter (Signed)
-----   Message from Ambrose Finland, NP sent at 06/29/2015  5:10 PM EST ----- Labs normal

## 2015-06-30 NOTE — Telephone Encounter (Signed)
Spoke with patient this am and he is aware of his normal lab results 

## 2015-07-01 ENCOUNTER — Ambulatory Visit (HOSPITAL_COMMUNITY)
Admission: RE | Admit: 2015-07-01 | Discharge: 2015-07-01 | Disposition: A | Discharge status: Home / Self Care | Payer: 59 | Source: Ambulatory Visit | Attending: Internal Medicine | Admitting: Internal Medicine

## 2015-07-01 DIAGNOSIS — I517 Cardiomegaly: Secondary | ICD-10-CM | POA: Diagnosis not present

## 2015-07-01 DIAGNOSIS — R0602 Shortness of breath: Secondary | ICD-10-CM

## 2015-07-01 DIAGNOSIS — J9811 Atelectasis: Secondary | ICD-10-CM | POA: Diagnosis not present

## 2015-07-01 DIAGNOSIS — Z86711 Personal history of pulmonary embolism: Secondary | ICD-10-CM | POA: Insufficient documentation

## 2015-07-01 MED ORDER — IOHEXOL 350 MG/ML SOLN
100.0000 mL | Freq: Once | INTRAVENOUS | Status: AC | PRN
  Administered 2015-07-01: 100 mL via INTRAVENOUS

## 2015-07-02 ENCOUNTER — Encounter: Payer: Self-pay | Admitting: Internal Medicine

## 2015-07-03 ENCOUNTER — Telehealth: Payer: Self-pay

## 2015-07-03 ENCOUNTER — Telehealth: Payer: Self-pay | Admitting: Internal Medicine

## 2015-07-03 NOTE — Telephone Encounter (Signed)
Returned phone call to patient  Patient calling for his cat scan results Patient is aware the results have not be reviewed at this time

## 2015-07-03 NOTE — Telephone Encounter (Signed)
Patient called requesting CAT scan results, please f/u with patient

## 2015-07-04 ENCOUNTER — Telehealth: Payer: Self-pay

## 2015-07-04 NOTE — Telephone Encounter (Signed)
Patient has called to get his results of his CT Please read and advise  thanks

## 2015-07-04 NOTE — Telephone Encounter (Signed)
Pt. Called requesting his CAT scan results. Please f/u with pt.

## 2015-07-08 ENCOUNTER — Telehealth: Payer: Self-pay | Admitting: Internal Medicine

## 2015-07-08 NOTE — Telephone Encounter (Signed)
Patient called stating he received his ct scan results thru my chart, but he does not understand them.[name].[name].[name].[name].please follow up with patient

## 2015-07-09 ENCOUNTER — Telehealth: Payer: Self-pay

## 2015-07-09 NOTE — Telephone Encounter (Signed)
Spoke with patient and he is aware of his normal CT scan And if necessary he will pick up a spirometry device

## 2015-07-09 NOTE — Telephone Encounter (Signed)
CT was normal. No Pulmonary Emboli. He may benefit from spirometry if we have one he can have. He needs to take deep breaths and expand his lungs.

## 2015-07-23 ENCOUNTER — Ambulatory Visit: Payer: 59 | Admitting: *Deleted

## 2015-08-08 ENCOUNTER — Other Ambulatory Visit: Payer: Self-pay | Admitting: Internal Medicine

## 2015-10-23 ENCOUNTER — Telehealth: Payer: Self-pay | Admitting: Internal Medicine

## 2015-10-23 NOTE — Telephone Encounter (Signed)
Pt. Called stating that he's been having heart palpitations and would like to know if it is serious.  Pt. Was advised to go to urgent care and he stated she would also like a call from the nurse. Please f/u with pt.

## 2015-10-24 ENCOUNTER — Ambulatory Visit (HOSPITAL_COMMUNITY)
Admission: EM | Admit: 2015-10-24 | Discharge: 2015-10-24 | Disposition: A | Discharge status: Home / Self Care | Payer: 59 | Attending: Emergency Medicine | Admitting: Emergency Medicine

## 2015-10-24 ENCOUNTER — Encounter (HOSPITAL_COMMUNITY): Payer: Self-pay | Admitting: Emergency Medicine

## 2015-10-24 DIAGNOSIS — H109 Unspecified conjunctivitis: Secondary | ICD-10-CM | POA: Diagnosis not present

## 2015-10-24 DIAGNOSIS — R002 Palpitations: Secondary | ICD-10-CM | POA: Diagnosis not present

## 2015-10-24 MED ORDER — FLUORESCEIN SODIUM 1 MG OP STRP
ORAL_STRIP | OPHTHALMIC | Status: AC
  Filled 2015-10-24: qty 1

## 2015-10-24 MED ORDER — OLOPATADINE HCL 0.1 % OP SOLN: 1.0000 [drp] | Freq: Two times a day (BID) | OPHTHALMIC | Status: DC

## 2015-10-24 MED ORDER — TETRACAINE HCL 0.5 % OP SOLN
OPHTHALMIC | Status: AC
  Filled 2015-10-24: qty 2

## 2015-10-24 MED ORDER — MOXIFLOXACIN HCL 0.5 % OP SOLN: 1.0000 [drp] | Freq: Three times a day (TID) | OPHTHALMIC | Status: DC

## 2015-10-24 MED ORDER — TETRACAINE HCL 0.5 % OP SOLN
1.0000 [drp] | Freq: Once | OPHTHALMIC | Status: DC
Start: 2015-10-24 — End: 2015-10-24

## 2015-10-24 NOTE — ED Provider Notes (Signed)
HPI  SUBJECTIVE:  Scott [name] is a 45 y.o. male who presents with 2 complaints: First he reports left eye irritation, conjunctival injection for the past 10 days. He has been using Polytrim eyedrops twice a day with some improvement in his symptoms. He reports crusting in the morning, but no other eye drainage. Reports pain when he rubs his eye, but denies other eye pain, pain with extraocular movements. There are no aggravating factors. No nausea, vomiting, visual changes, blurry vision, headache, halos around lights, pain worse in a dark, trauma to the eye, known chemical exposure No foreign body sensation, gritty sensation. No photophobia, periorbital swelling, erythema, rash, ear pain. He reports nasal congestion and postnasal drip which is not new for him, he has seasonal allergies. He denies any URI-like symptoms.  No sick contacts with similar symptoms. Past medical history negative for sickle cell anemia, glaucoma. He wears glasses.  Second, he reports palpitations for the past 3 weeks that he states last seconds and then resolves. He states that he feels like his heart "skips a beat". He describes 2 episodes of burning chest pain described as heartburn 1 several days ago and one episode today. This lasts seconds. It is not associated with the palpitations. States is similar to previous episodes of heartburn. The palpitations get worse with exertion, no alleviating factors. It resolves on its own. He has not tried anything for this. He denies nausea, vomiting, diaphoresis, lightheadedness, dizziness, presyncope, syncope the palpitations or the chest pain. There is no exertional or positional component to his chest pain. He reports a nonproductive cough, but this is not changed today. He is on an ACE inhibitor. No belching, water brash, wheezing, abdominal pain. No unintentional weight loss, cold or heat intolerance, skin or hair changes. He does not drink any caffeine. He denies cocaine use,  herbal supplements, energy drinks. He has past medical history of diabetes, hypertension, provoked DVT/PE currently on 81 mg aspirin daily, GERD. No history of MI, hyperthyroidism, hypothyroidism, a 2 fibrillation, SVT, other arrhythmia. Family history negative for arrhythmias, sudden cardiac death. PMD: [facility]. Cardiology: None.  Past Medical History  Diagnosis Date  . Diabetes mellitus   . Hypertension   . DVT (deep venous thrombosis) (HCC)   . Obesity   . Carpal tunnel syndrome   . Shoulder pain, bilateral     Past Surgical History  Procedure Laterality Date  . Rotator cuff repair      Family History  Problem Relation Age of Onset  . Hypertension Other   . Hyperlipidemia Other   . Diabetes Other   . Hypertension Mother   . Kidney disease Father     Social History  Substance Use Topics  . Smoking status: Never Smoker   . Smokeless tobacco: Never Used  . Alcohol Use: No     Current facility-administered medications:  .  tetracaine (PONTOCAINE) 0.5 % ophthalmic solution 1-2 drop, 1-2 drop, Right Eye, Once, Domenick GongAshley Oluwasemilore Pascuzzi, MD  Current outpatient prescriptions:  .  aspirin EC 81 MG tablet, Take 81 mg by mouth daily., Disp: , Rfl:  .  atorvastatin (LIPITOR) 20 MG tablet, Take 1 tablet (20 mg total) by mouth daily., Disp: 90 tablet, Rfl: 3 .  gabapentin (NEURONTIN) 300 MG capsule, Take 1 capsule (300 mg total) by mouth 3 (three) times daily., Disp: 90 capsule, Rfl: 1 .  Linagliptin-Metformin HCl (JENTADUETO) 2.08-998 MG TABS, Take 1 tablet by mouth daily. Needs office visit for refills, Disp: 30 tablet, Rfl: 0 .  lisinopril (PRINIVIL,ZESTRIL) 10 MG tablet, Take 1 tablet (10 mg total) by mouth daily., Disp: 60 tablet, Rfl: 4 .  fluticasone (FLONASE) 50 MCG/ACT nasal spray, USE 2 SPRAYS IN EACH NOSTRIL ONCE DAILY, Disp: 16 g, Rfl: 5 .  loratadine (CLARITIN) 10 MG tablet, Take 1 tablet (10 mg total) by mouth daily., Disp: 30 tablet, Rfl: 1 .  moxifloxacin (VIGAMOX)  0.5 % ophthalmic solution, Place 1 drop into both eyes 3 (three) times daily. X 7 days, Disp: 3 mL, Rfl: 0 .  olopatadine (PATANOL) 0.1 % ophthalmic solution, Place 1 drop into both eyes 2 (two) times daily., Disp: 5 mL, Rfl: 0  No Known Allergies   ROS  As noted in HPI.   Physical Exam  BP 160/103 mmHg  Pulse 80  Temp(Src) 98.4 F (36.9 C) (Oral)  Resp 18  SpO2 100%   BP Readings from Last 3 Encounters:  10/24/15 160/103  06/25/15 139/94  05/29/15 137/92   repeat BP BP: 146/95 mmHg   Constitutional: Well developed, well nourished, no acute distress Eyes:  EOMI, PERRLA, Positive left-sided conjunctival injection, no consensual or direct photophobia. No foreign body seen on lid eversion. No periorbital erythema, edema. No corneal abrasion seen on flourescin exam. No obvious hyphema.  Corrected Visual acuity  20/25 left eye, 20/25 right eye. HENT: Normocephalic, atraumatic,mucus membranes moist. Left TM within normal limits Endocrine: No thyroid enlargement or tenderness Respiratory: Normal inspiratory effort good air movement, lungs clear bilaterally Cardiovascular: Normal rate, regular rhythm, no murmurs, rubs, gallops GI: nondistended skin: No facial rash, skin intact Musculoskeletal: no deformities Neurologic: Alert & oriented x 3, no focal neuro deficits. Reflexes 2+ and equal bilaterally. No hyperreflexia. Psychiatric: Speech and behavior appropriate   ED Course   Medications  tetracaine (PONTOCAINE) 0.5 % ophthalmic solution 1-2 drop (not administered)    Orders Placed This Encounter  Procedures  . Visual acuity screening    Standing Status: Standing     Number of Occurrences: 1     Standing Expiration Date:   [name]. Vital signs    Standing Status: Standing     Number of Occurrences: 1     Standing Expiration Date:   . ED EKG    Palpitations    Standing Status: Standing     Number of Occurrences: 1     Standing Expiration Date:     Order Specific  Question:  Reason for Exam    Answer:  Other (See Comments)    No results found for this or any previous visit (from the past 24 hour(s)). No results found.  ED Clinical Impression  Conjunctivitis of left eye  Palpitations   ED Assessment/Plan  EKG: Normal sinus rhythm, rate 71. Normal axis, normal intervals. No hypertrophy. No ST elevation. Nonspecific T-wave changes, no change compared to EKG from 09/02/2011.  Presentation most consistent with a conjunctivitis although iritis, glaucoma, hyphema are in the differential. Think that this is less likely in the absence of photophobia and eye pain. He has not responded fully to the Polytrim eyedrops, so will start him on Vigamox. Also since he has a history of allergies we'll start him on Patanol and advised Zyrtec Claritin or Allegra. He is to follow-up with ophthalmology on-call, Dr. Alton Revereobert S Groat for follow-up if no better in several days.  Repeat blood pressure 146/95. States that he takes his blood pressure medicines at night.  No evidence of arrhythmia on EKG. No evidence of STEMI. Discussed with patient that he would need to  have an outpatient workup for his palpitations. He denies any chest pain or shortness of breath when having these palpitations. No syncope. Will have him keep an eye on his burning chest discomfort. May be GERD given as he has had a history of GERD, and states this feels similar to previous episodes. Advised Pepcid or Zantac. Will  provide referral to cardiology for f/u within a week. Gave patient strict ER return precautions.   Discussed MDM, plan and followup with patient. Discussed sn/sx that should prompt return to the ED. Patient  agrees with plan.   *This clinic note was created using Dragon dictation software. Therefore, there may be occasional mistakes despite careful proofreading.  ?   Domenick Gong, MD 10/24/15 2139

## 2015-10-24 NOTE — ED Notes (Signed)
[facility] a [facility] of left eye irritation x 1 week. The patient stated that his eye had been itching and "crusty." The patient also reported periodic "heart palpitations" that he would like to be evaluated. The patient stated that they do not correlate to any events and the patient stated only moderate pain when the events occur. The patient denied any previous cardiac hx.

## 2015-10-24 NOTE — Discharge Instructions (Signed)
Follow-up with Dr. Lisabeth DevoidMeng in a week to have your palpitations evaluated. He is a cardiologist. Start some Zantac or Pepcid for your heartburn. Go to the ER for the signs and symptoms we discussed.  Follow-up with Dr. Dione BoozeGroat if your eyes not getting better with the Vigamox and Patanol in 2 days.

## 2015-10-24 NOTE — Telephone Encounter (Signed)
Routed to Provider for advice

## 2015-11-03 ENCOUNTER — Encounter (HOSPITAL_COMMUNITY): Payer: Self-pay | Admitting: Emergency Medicine

## 2015-11-03 ENCOUNTER — Ambulatory Visit (HOSPITAL_COMMUNITY)
Admission: EM | Admit: 2015-11-03 | Discharge: 2015-11-03 | Disposition: A | Discharge status: Home / Self Care | Payer: 59 | Attending: Emergency Medicine | Admitting: Emergency Medicine

## 2015-11-03 DIAGNOSIS — Z23 Encounter for immunization: Secondary | ICD-10-CM

## 2015-11-03 DIAGNOSIS — S90851A Superficial foreign body, right foot, initial encounter: Secondary | ICD-10-CM

## 2015-11-03 MED ORDER — TETANUS-DIPHTH-ACELL PERTUSSIS 5-2.5-18.5 LF-MCG/0.5 IM SUSP
INTRAMUSCULAR | Status: AC
  Filled 2015-11-03: qty 0.5

## 2015-11-03 MED ORDER — TETANUS-DIPHTH-ACELL PERTUSSIS 5-2.5-18.5 LF-MCG/0.5 IM SUSP
0.5000 mL | Freq: Once | INTRAMUSCULAR | Status: AC
  Administered 2015-11-03: 0.5 mL via INTRAMUSCULAR

## 2015-11-03 NOTE — Discharge Instructions (Signed)
Sliver Removal, Care After A sliver--also called a splinter--is a small and thin broken piece of an object that gets stuck (embedded) under the skin. A sliver can create a deep wound that can easily become infected. It is important to care for the wound after a sliver is removed to help prevent infection and other problems from developing. WHAT TO EXPECT AFTER THE PROCEDURE Slivers often break into smaller pieces when they are removed. If pieces of your sliver broke off and stayed in your skin, you will eventually see them working themselves out and you may feel some pain at the wound site. This is normal. HOME CARE INSTRUCTIONS  Keep all follow-up visits as directed by your health care provider. This is important.  There are many different ways to close and cover a wound, including stitches (sutures) and adhesive strips. Follow your health care provider's instructions about:  Wound care.  Bandage (dressing) changes and removal.  Wound closure removal.  Check the wound site every day for signs of infection. Watch for:  Red streaks coming from the wound.  Fever.  Redness or tenderness around the wound.  Fluid, blood, or pus coming from the wound.  A bad smell coming from the wound. SEEK MEDICAL CARE IF:  You think that a piece of the sliver is still in your skin.  Your wound was closed, as with sutures, and the edges of the wound break open.  You have signs of infection, including:  New or worsening redness around the wound.  New or worsening tenderness around the wound.  Fluid, blood, or pus coming from the wound.  A bad smell coming from the wound or dressing. SEEK IMMEDIATE MEDICAL CARE IF: You have any of the following signs of infection:  Red streaks coming from the wound.  An unexplained fever.   This information is not intended to replace advice given to you by your health care provider. Make sure you discuss any questions you have with your health care  provider.   Document Released: 04/16/2000 Document Revised: 05/10/2014 Document Reviewed: 12/20/2013 Elsevier Interactive Patient Education 2016 [employer]  

## 2015-11-03 NOTE — ED Notes (Signed)
Right foot pain.  Patient thinks he stepped on a nail.  Patient was walking in his home from a carpeted area to non-carpeted area, different level.  Pain in center of ball of foot

## 2015-11-03 NOTE — ED Provider Notes (Signed)
CSN: [id number]     Arrival date & time 11/03/15  1854 History   None    Chief Complaint  Patient presents with  . Foot Pain   (Consider location/radiation/quality/duration/timing/severity/associated sxs/prior Treatment)  HPI   The patient is a 45 year old male presenting today with foot pain following stepping on a carpet tack strip approximately 2 days ago. Patient states that it started to hurt today and he is unsure if there is a foreign body in his foot. Patient is unsure of tetanus status. He is a type II diabetic with last hemoglobin A1c of 8.  Past Medical History  Diagnosis Date  . Diabetes mellitus   . Hypertension   . DVT (deep venous thrombosis) (HCC)   . Obesity   . Carpal tunnel syndrome   . Shoulder pain, bilateral    Past Surgical History  Procedure Laterality Date  . Rotator cuff repair     Family History  Problem Relation Age of Onset  . Hypertension Other   . Hyperlipidemia Other   . Diabetes Other   . Hypertension Mother   . Kidney disease Father    Social History  Substance Use Topics  . Smoking status: Never Smoker   . Smokeless tobacco: Never Used  . Alcohol Use: No    Review of Systems  Constitutional: Negative.   HENT: Negative.   Eyes: Negative.   Respiratory: Negative.   Cardiovascular: Negative.   Gastrointestinal: Negative.   Endocrine: Negative.   Genitourinary: Negative.   Musculoskeletal: Negative.   Skin: Positive for wound.  Allergic/Immunologic: Negative.   Neurological: Negative.   Hematological: Negative.   Psychiatric/Behavioral: Negative.     Allergies  Review of patient's allergies indicates no known allergies.  Home Medications   Prior to Admission medications   Medication Sig Start Date End Date Taking? Authorizing Provider  aspirin EC 81 MG tablet Take 81 mg by mouth daily.    Historical Provider, MD  atorvastatin (LIPITOR) 20 MG tablet Take 1 tablet (20 mg total) by mouth daily. 12/02/14   Ambrose FinlandValerie A Keck, NP   fluticasone [facility](FLONASE) 50 MCG/ACT nasal spray USE 2 SPRAYS IN EACH NOSTRIL ONCE DAILY 02/26/15   Ambrose FinlandValerie A Keck, NP  gabapentin (NEURONTIN) 300 MG capsule Take 1 capsule (300 mg total) by mouth 3 (three) times daily. 05/07/15   Lenda KelpShane R Hudnall, MD  Linagliptin-Metformin HCl (JENTADUETO) 2.08-998 MG TABS Take 1 tablet by mouth daily. Needs office visit for refills 08/08/15   Ambrose FinlandValerie A Keck, NP  lisinopril (PRINIVIL,ZESTRIL) 10 MG tablet Take 1 tablet (10 mg total) by mouth daily. 04/03/15   Ambrose FinlandValerie A Keck, NP  loratadine (CLARITIN) 10 MG tablet Take 1 tablet (10 mg total) by mouth daily. 04/03/15   Ambrose FinlandValerie A Keck, NP  moxifloxacin (VIGAMOX) 0.5 % ophthalmic solution Place 1 drop into both eyes 3 (three) times daily. X 7 days 10/24/15   Domenick GongAshley Mortenson, MD  olopatadine (PATANOL) 0.1 % ophthalmic solution Place 1 drop into both eyes 2 (two) times daily. 10/24/15   Domenick GongAshley Mortenson, MD   Meds Ordered and Administered this Visit   Medications  Tdap (BOOSTRIX) injection 0.5 mL (0.5 mLs Intramuscular Given 11/03/15 2052)    BP 156/107 mmHg  Pulse 84  Temp(Src) 98.2 F (36.8 C) (Oral)  Resp 18  SpO2 96% No data found.   Physical Exam  Constitutional: He appears well-developed and well-nourished. No distress.  Cardiovascular: Normal rate, regular rhythm, normal heart sounds and intact distal pulses.  Exam reveals no gallop  and no friction rub.   No murmur heard. Pulmonary/Chest: Effort normal and breath sounds normal. No respiratory distress. He has no wheezes. He has no rales. He exhibits no tenderness.  Skin: Skin is warm and dry. He is not diaphoretic.  Patient has small wound on ball of left foot.  Callous and dead skin removed conservatively and small piece of glass removed.  Dr. Artis FlockKindl consulted as larger piece remained.  Dr. Artis FlockKindl removed second larger piece without incident.   Nursing note and vitals reviewed.   ED Course  .Foreign Body Removal Date/Time: 11/03/2015 8:45 PM Performed by:  Servando SalinaOSSI, CATHERINE H Authorized by: Servando SalinaOSSI, CATHERINE H Consent: Verbal consent obtained. Risks and benefits: risks, benefits and alternatives were discussed Consent given by: patient Patient identity confirmed: verbally with patient Intake: Ball of Right foot. Patient sedated: no Complexity: simple Comments: 2 small pieces of glass removed without anesthetic using 11 blade scalpel and tweezers.    (including critical care time)  Labs Review Labs Reviewed - No data to display  Imaging Review No results found.    MDM   1. Foreign body in foot, right, initial encounter     Meds ordered this encounter  Medications  . Tdap (BOOSTRIX) injection 0.5 mL    Sig:     Discussed the importance of checking his foot daily for signs or symptoms of infection. Verbalizes understanding of the importance of  follow-up and diabetic foot care. The patient verbalizes understanding and agrees to plan of care.       Servando Salinaatherine H Rossi, NP 11/03/15 2102

## 2015-11-05 ENCOUNTER — Other Ambulatory Visit: Payer: Self-pay | Admitting: *Deleted

## 2015-11-05 ENCOUNTER — Telehealth: Payer: Self-pay | Admitting: Family Medicine

## 2015-11-05 DIAGNOSIS — M79642 Pain in left hand: Principal | ICD-10-CM

## 2015-11-05 DIAGNOSIS — M79641 Pain in right hand: Secondary | ICD-10-CM

## 2015-11-05 NOTE — Telephone Encounter (Signed)
Spoke to patient and gave him appointment information.

## 2015-11-13 ENCOUNTER — Ambulatory Visit (INDEPENDENT_AMBULATORY_CARE_PROVIDER_SITE_OTHER): Payer: 59 | Admitting: Family Medicine

## 2015-11-13 ENCOUNTER — Encounter: Payer: Self-pay | Admitting: Family Medicine

## 2015-11-13 VITALS — BP 131/89 | HR 99 | Ht 66.0 in | Wt 234.0 lb

## 2015-11-13 DIAGNOSIS — G5602 Carpal tunnel syndrome, left upper limb: Secondary | ICD-10-CM

## 2015-11-13 DIAGNOSIS — M7701 Medial epicondylitis, right elbow: Secondary | ICD-10-CM | POA: Diagnosis not present

## 2015-11-13 DIAGNOSIS — G5603 Carpal tunnel syndrome, bilateral upper limbs: Secondary | ICD-10-CM | POA: Diagnosis not present

## 2015-11-13 MED ORDER — METHYLPREDNISOLONE ACETATE 40 MG/ML IJ SUSP
40.0000 mg | Freq: Once | INTRAMUSCULAR | Status: AC
  Administered 2015-11-13: 40 mg via INTRA_ARTICULAR

## 2015-11-13 NOTE — Patient Instructions (Signed)
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 Tylenol or aleve as needed for pain. Icing 3-4 times a day - careful around ulnar nerve on inside of elbow as we discussed. Strengthening with 1 pound weight pronation/supination, wrist flexion, stretching exercises (hold stretches 20-30 seconds, exercises 15 of each - do 3 sets of stretches/exercises). Counterforce brace may be helpful to unload area of pain while it heals. Consider formal PT if not improving with home exercises. Can consider cortisone injection into area of pain though this has fallen out of favor due to recent research. Nitro patches 1/4th patch to affected area, change daily. Follow up with me in 6 weeks.

## 2015-11-14 ENCOUNTER — Other Ambulatory Visit: Payer: Self-pay | Admitting: Orthopedic Surgery

## 2015-11-17 ENCOUNTER — Telehealth: Payer: Self-pay | Admitting: Family Medicine

## 2015-11-17 MED ORDER — NITROGLYCERIN 0.2 MG/HR TD PT24: MEDICATED_PATCH | TRANSDERMAL | Status: DC

## 2015-11-17 NOTE — Telephone Encounter (Signed)
Ok please let them know they should have it now.  Thanks!

## 2015-11-18 DIAGNOSIS — M7701 Medial epicondylitis, right elbow: Secondary | ICD-10-CM | POA: Insufficient documentation

## 2015-11-18 NOTE — Assessment & Plan Note (Signed)
Exam otherwise benign - doesn't hurt with flexion of wrist or digits.  Advised against injection.  He will start with home exercise program, sleeve, nitro patches (discussed risks of headache, skin irritation).  Tylenol as needed, icing but be careful around ulnar nerve.  Consider physical therapy, increased nitro dosage if not improving.

## 2015-11-18 NOTE — Progress Notes (Signed)
PCP: Ambrose Finland, NP  Subjective:   HPI: Patient is a 45 y.o. male here for right elbow pain.  Patient returns with a 10 day history of medial right elbow pain. Started hurting the same day he was putting up a portable shed. Pain level 7/10 at rest, up to 10/10 with full extension and a lot of activity. No acute injury or trauma. Pain is sharp. No swelling, bruising. No skin changes, numbness.  Past Medical History  Diagnosis Date  . Diabetes mellitus   . Hypertension   . DVT (deep venous thrombosis) ([facility])   . Obesity   . Carpal tunnel syndrome   . Shoulder pain, bilateral     Current Outpatient Prescriptions on File Prior to Visit  Medication Sig Dispense Refill  . aspirin EC 81 MG tablet Take 81 mg by mouth daily.    [name] atorvastatin (LIPITOR) 20 MG tablet Take 1 tablet (20 mg total) by mouth daily. 90 tablet 3  . fluticasone (FLONASE) 50 MCG/ACT nasal spray USE 2 SPRAYS IN EACH NOSTRIL ONCE DAILY 16 g 5  . gabapentin (NEURONTIN) 300 MG capsule Take 1 capsule (300 mg total) by mouth 3 (three) times daily. 90 capsule 1  . Linagliptin-Metformin HCl (JENTADUETO) 2.08-998 MG TABS Take 1 tablet by mouth daily. Needs office visit for refills 30 tablet 0  . lisinopril (PRINIVIL,ZESTRIL) 10 MG tablet Take 1 tablet (10 mg total) by mouth daily. 60 tablet 4  . loratadine (CLARITIN) 10 MG tablet Take 1 tablet (10 mg total) by mouth daily. 30 tablet 1  . moxifloxacin (VIGAMOX) 0.5 % ophthalmic solution Place 1 drop into both eyes 3 (three) times daily. X 7 days 3 mL 0  . olopatadine (PATANOL) 0.1 % ophthalmic solution Place 1 drop into both eyes 2 (two) times daily. 5 mL 0   No current facility-administered medications on file prior to visit.    Past Surgical History  Procedure Laterality Date  . Rotator cuff repair      No Known Allergies  Social History   Social History  . Marital Status: Married    Spouse Name: N/A  . Number of Children: N/A  . Years of Education: 12+    Occupational History  . real estate agent    Social History Main Topics  . Smoking status: Never Smoker   . Smokeless tobacco: Never Used  . Alcohol Use: No  . Drug Use: No  . Sexual Activity: Not on file   Other Topics Concern  . Not on file   Social History Narrative   Lives at home.   Right-handed.   Occasional caffeine use.    Family History  Problem Relation Age of Onset  . Hypertension Other   . Hyperlipidemia Other   . Diabetes Other   . Hypertension Mother   . Kidney disease Father     BP 131/89 mmHg  Pulse 99  Ht  (1.676 m)  Wt 234 lb (106.142 kg)  BMI 37.79 kg/m2  Review of Systems: See HPI above.    Objective:  Physical Exam:  Gen: NAD, comfortable in exam room  Right elbow: No gross deformity, swelling, bruising. TTP medial epicondyle. FROM elbow and wrist without pain currently. Strength 5/5 all motions of elbow and wrist. Collateral ligaments intact. Negative tinels cubital tunnel. NVI distally.  Left elbow: FROM without pain.    Assessment & Plan:  1. Right medial epicondylitis - Exam otherwise benign - doesn't hurt with flexion of wrist or digits.  Advised against injection.  He will start with home exercise program, sleeve, nitro patches (discussed risks of headache, skin irritation).  Tylenol as needed, icing but be careful around ulnar nerve.  Consider physical therapy, increased nitro dosage if not improving.  2. Left carpal tunnel syndrome - plans to have surgery in a few weeks on right carpal tunnel - would like to repeat injection on left which was done today.  After informed written consent patient was seated in chair in exam room.  Ultrasound used to identify median nerve and ulnar artery.  Area prepped here with alcohol swab then left carpal tunnel injected with 2:1 marcaine: depomedrol.  Patient tolerated procedure well without immediate complications.

## 2015-11-18 NOTE — Assessment & Plan Note (Signed)
plans to have surgery in a few weeks on right carpal tunnel - would like to repeat injection on left which was done today.  After informed written consent patient was seated in chair in exam room.  Ultrasound used to identify median nerve and ulnar artery.  Area prepped here with alcohol swab then left carpal tunnel injected with 2:1 marcaine: depomedrol.  Patient tolerated procedure well without immediate complications.

## 2015-11-27 ENCOUNTER — Encounter (HOSPITAL_BASED_OUTPATIENT_CLINIC_OR_DEPARTMENT_OTHER): Payer: Self-pay | Admitting: *Deleted

## 2015-11-27 NOTE — Progress Notes (Signed)
Coming tomorrow or Monday for BMET. Bring all medications.

## 2015-12-02 ENCOUNTER — Encounter (HOSPITAL_BASED_OUTPATIENT_CLINIC_OR_DEPARTMENT_OTHER)
Admission: RE | Admit: 2015-12-02 | Discharge: 2015-12-02 | Disposition: A | Discharge status: Home / Self Care | Payer: 59 | Source: Ambulatory Visit | Attending: Orthopedic Surgery | Admitting: Orthopedic Surgery

## 2015-12-02 DIAGNOSIS — Z7951 Long term (current) use of inhaled steroids: Secondary | ICD-10-CM | POA: Diagnosis not present

## 2015-12-02 DIAGNOSIS — Z7982 Long term (current) use of aspirin: Secondary | ICD-10-CM | POA: Diagnosis not present

## 2015-12-02 DIAGNOSIS — G5601 Carpal tunnel syndrome, right upper limb: Secondary | ICD-10-CM | POA: Diagnosis not present

## 2015-12-02 DIAGNOSIS — Z7984 Long term (current) use of oral hypoglycemic drugs: Secondary | ICD-10-CM | POA: Diagnosis not present

## 2015-12-02 DIAGNOSIS — Z6837 Body mass index (BMI) 37.0-37.9, adult: Secondary | ICD-10-CM | POA: Diagnosis not present

## 2015-12-02 DIAGNOSIS — E119 Type 2 diabetes mellitus without complications: Secondary | ICD-10-CM | POA: Diagnosis not present

## 2015-12-02 DIAGNOSIS — E669 Obesity, unspecified: Secondary | ICD-10-CM | POA: Diagnosis not present

## 2015-12-02 DIAGNOSIS — Z79899 Other long term (current) drug therapy: Secondary | ICD-10-CM | POA: Diagnosis not present

## 2015-12-02 DIAGNOSIS — Z86718 Personal history of other venous thrombosis and embolism: Secondary | ICD-10-CM | POA: Diagnosis not present

## 2015-12-02 DIAGNOSIS — G4730 Sleep apnea, unspecified: Secondary | ICD-10-CM | POA: Diagnosis not present

## 2015-12-02 DIAGNOSIS — I10 Essential (primary) hypertension: Secondary | ICD-10-CM | POA: Diagnosis not present

## 2015-12-02 HISTORY — PX: CARPAL TUNNEL RELEASE: SHX101

## 2015-12-02 LAB — BASIC METABOLIC PANEL
ANION GAP: 8 (ref 5–15)
BUN: 14 mg/dL (ref 6–20)
CALCIUM: 9.6 mg/dL (ref 8.9–10.3)
CHLORIDE: 105 mmol/L (ref 101–111)
CO2: 26 mmol/L (ref 22–32)
CREATININE: 1.16 mg/dL (ref 0.61–1.24)
GFR calc non Af Amer: >60 mL/min (ref >60)
Glucose, Bld: 131 mg/dL — ABNORMAL HIGH (ref 65–99)
Potassium: 4.1 mmol/L (ref 3.5–5.1)
SODIUM: 139 mmol/L (ref 135–145)

## 2015-12-04 ENCOUNTER — Ambulatory Visit (HOSPITAL_BASED_OUTPATIENT_CLINIC_OR_DEPARTMENT_OTHER)
Admission: RE | Admit: 2015-12-04 | Discharge: 2015-12-04 | Disposition: A | Discharge status: Home / Self Care | Payer: 59 | Source: Ambulatory Visit | Attending: Orthopedic Surgery | Admitting: Orthopedic Surgery

## 2015-12-04 ENCOUNTER — Encounter (HOSPITAL_BASED_OUTPATIENT_CLINIC_OR_DEPARTMENT_OTHER): Payer: Self-pay

## 2015-12-04 ENCOUNTER — Encounter (HOSPITAL_BASED_OUTPATIENT_CLINIC_OR_DEPARTMENT_OTHER)
Admission: RE | Disposition: A | Discharge status: Home / Self Care | Payer: Self-pay | Source: Ambulatory Visit | Attending: Orthopedic Surgery

## 2015-12-04 ENCOUNTER — Ambulatory Visit (HOSPITAL_BASED_OUTPATIENT_CLINIC_OR_DEPARTMENT_OTHER): Payer: 59 | Admitting: Certified Registered"

## 2015-12-04 DIAGNOSIS — Z6837 Body mass index (BMI) 37.0-37.9, adult: Secondary | ICD-10-CM | POA: Insufficient documentation

## 2015-12-04 DIAGNOSIS — Z7984 Long term (current) use of oral hypoglycemic drugs: Secondary | ICD-10-CM | POA: Insufficient documentation

## 2015-12-04 DIAGNOSIS — Z79899 Other long term (current) drug therapy: Secondary | ICD-10-CM | POA: Insufficient documentation

## 2015-12-04 DIAGNOSIS — G4730 Sleep apnea, unspecified: Secondary | ICD-10-CM | POA: Insufficient documentation

## 2015-12-04 DIAGNOSIS — I10 Essential (primary) hypertension: Secondary | ICD-10-CM | POA: Insufficient documentation

## 2015-12-04 DIAGNOSIS — E119 Type 2 diabetes mellitus without complications: Secondary | ICD-10-CM | POA: Insufficient documentation

## 2015-12-04 DIAGNOSIS — Z7982 Long term (current) use of aspirin: Secondary | ICD-10-CM | POA: Insufficient documentation

## 2015-12-04 DIAGNOSIS — Z7951 Long term (current) use of inhaled steroids: Secondary | ICD-10-CM | POA: Insufficient documentation

## 2015-12-04 DIAGNOSIS — G5601 Carpal tunnel syndrome, right upper limb: Secondary | ICD-10-CM | POA: Diagnosis not present

## 2015-12-04 DIAGNOSIS — E669 Obesity, unspecified: Secondary | ICD-10-CM | POA: Insufficient documentation

## 2015-12-04 DIAGNOSIS — Z86718 Personal history of other venous thrombosis and embolism: Secondary | ICD-10-CM | POA: Insufficient documentation

## 2015-12-04 HISTORY — DX: Sleep apnea, unspecified: G47.30

## 2015-12-04 HISTORY — PX: CARPAL TUNNEL RELEASE: SHX101

## 2015-12-04 LAB — GLUCOSE, CAPILLARY
Glucose-Capillary: 114 mg/dL — ABNORMAL HIGH (ref 65–99)
Glucose-Capillary: 95 mg/dL (ref 65–99)

## 2015-12-04 SURGERY — CARPAL TUNNEL RELEASE
Anesthesia: Monitor Anesthesia Care | Site: Hand | Laterality: Right

## 2015-12-04 MED ORDER — FENTANYL CITRATE (PF) 100 MCG/2ML IJ SOLN
INTRAMUSCULAR | Status: AC
  Filled 2015-12-04: qty 2

## 2015-12-04 MED ORDER — LIDOCAINE HCL (PF) 0.5 % IJ SOLN
INTRAMUSCULAR | Status: DC | PRN
  Administered 2015-12-04: 40 mL via INTRAVENOUS

## 2015-12-04 MED ORDER — ONDANSETRON HCL 4 MG/2ML IJ SOLN
INTRAMUSCULAR | Status: AC
  Filled 2015-12-04: qty 2

## 2015-12-04 MED ORDER — CEFAZOLIN SODIUM-DEXTROSE 2-4 GM/100ML-% IV SOLN
INTRAVENOUS | Status: AC
  Filled 2015-12-04: qty 100

## 2015-12-04 MED ORDER — PROPOFOL 10 MG/ML IV BOLUS
INTRAVENOUS | Status: AC
  Filled 2015-12-04: qty 20

## 2015-12-04 MED ORDER — ONDANSETRON HCL 4 MG/2ML IJ SOLN
INTRAMUSCULAR | Status: DC | PRN
  Administered 2015-12-04: 4 mg via INTRAVENOUS

## 2015-12-04 MED ORDER — CEFAZOLIN SODIUM-DEXTROSE 2-4 GM/100ML-% IV SOLN
2.0000 g | INTRAVENOUS | Status: AC
  Administered 2015-12-04: 2 g via INTRAVENOUS

## 2015-12-04 MED ORDER — SCOPOLAMINE 1 MG/3DAYS TD PT72: 1.0000 | MEDICATED_PATCH | Freq: Once | TRANSDERMAL | Status: DC | PRN

## 2015-12-04 MED ORDER — PROPOFOL 10 MG/ML IV BOLUS
INTRAVENOUS | Status: DC | PRN
  Administered 2015-12-04: 30 mg via INTRAVENOUS
  Administered 2015-12-04: 10 mg via INTRAVENOUS
  Administered 2015-12-04: 20 mg via INTRAVENOUS

## 2015-12-04 MED ORDER — MIDAZOLAM HCL 2 MG/2ML IJ SOLN
INTRAMUSCULAR | Status: AC
  Filled 2015-12-04: qty 2

## 2015-12-04 MED ORDER — FENTANYL CITRATE (PF) 100 MCG/2ML IJ SOLN: 25.0000 ug | INTRAMUSCULAR | Status: DC | PRN

## 2015-12-04 MED ORDER — MIDAZOLAM HCL 2 MG/2ML IJ SOLN
1.0000 mg | INTRAMUSCULAR | Status: DC | PRN
  Administered 2015-12-04 (×2): 1 mg via INTRAVENOUS

## 2015-12-04 MED ORDER — ONDANSETRON HCL 4 MG/2ML IJ SOLN: 4.0000 mg | Freq: Once | INTRAMUSCULAR | Status: DC | PRN

## 2015-12-04 MED ORDER — GLYCOPYRROLATE 0.2 MG/ML IJ SOLN: 0.2000 mg | Freq: Once | INTRAMUSCULAR | Status: DC | PRN

## 2015-12-04 MED ORDER — BUPIVACAINE HCL (PF) 0.25 % IJ SOLN
INTRAMUSCULAR | Status: DC | PRN
  Administered 2015-12-04: 10 mL

## 2015-12-04 MED ORDER — HYDROCODONE-ACETAMINOPHEN 5-325 MG PO TABS: ORAL_TABLET | ORAL | 0 refills | Status: DC

## 2015-12-04 MED ORDER — FENTANYL CITRATE (PF) 100 MCG/2ML IJ SOLN
50.0000 ug | INTRAMUSCULAR | Status: DC | PRN
  Administered 2015-12-04 (×2): 50 ug via INTRAVENOUS

## 2015-12-04 MED ORDER — LACTATED RINGERS IV SOLN
INTRAVENOUS | Status: DC
  Administered 2015-12-04: 10:00:00 via INTRAVENOUS

## 2015-12-04 MED ORDER — CHLORHEXIDINE GLUCONATE 4 % EX LIQD: 60.0000 mL | Freq: Once | CUTANEOUS | Status: DC

## 2015-12-04 SURGICAL SUPPLY — 34 items
BANDAGE ACE 3X5.8 VEL STRL LF (GAUZE/BANDAGES/DRESSINGS) ×2 IMPLANT
BLADE SURG 15 STRL LF DISP TIS (BLADE) ×2 IMPLANT
BLADE SURG 15 STRL SS (BLADE) ×4
BNDG CMPR 9X4 STRL LF SNTH (GAUZE/BANDAGES/DRESSINGS)
BNDG ESMARK 4X9 LF (GAUZE/BANDAGES/DRESSINGS) IMPLANT
BNDG GAUZE ELAST 4 BULKY (GAUZE/BANDAGES/DRESSINGS) ×2 IMPLANT
CHLORAPREP W/TINT 26ML (MISCELLANEOUS) ×2 IMPLANT
CORDS BIPOLAR (ELECTRODE) ×2 IMPLANT
COVER BACK TABLE 60X90IN (DRAPES) ×2 IMPLANT
COVER MAYO STAND STRL (DRAPES) ×2 IMPLANT
CUFF TOURNIQUET SINGLE 18IN (TOURNIQUET CUFF) ×2 IMPLANT
DRAPE EXTREMITY T 121X128X90 (DRAPE) ×2 IMPLANT
DRAPE SURG 17X23 STRL (DRAPES) ×2 IMPLANT
DRSG PAD ABDOMINAL 8X10 ST (GAUZE/BANDAGES/DRESSINGS) ×2 IMPLANT
GAUZE SPONGE 4X4 12PLY STRL (GAUZE/BANDAGES/DRESSINGS) ×2 IMPLANT
GAUZE XEROFORM 1X8 LF (GAUZE/BANDAGES/DRESSINGS) ×2 IMPLANT
GLOVE BIO SURGEON STRL SZ7.5 (GLOVE) ×2 IMPLANT
GLOVE BIOGEL PI IND STRL 8 (GLOVE) ×1 IMPLANT
GLOVE BIOGEL PI INDICATOR 8 (GLOVE) ×1
GOWN STRL REUS W/ TWL LRG LVL3 (GOWN DISPOSABLE) ×1 IMPLANT
GOWN STRL REUS W/TWL LRG LVL3 (GOWN DISPOSABLE) ×2
GOWN STRL REUS W/TWL XL LVL3 (GOWN DISPOSABLE) ×2 IMPLANT
NDL HYPO 25X1 1.5 SAFETY (NEEDLE) IMPLANT
NEEDLE HYPO 25X1 1.5 SAFETY (NEEDLE) IMPLANT
NS IRRIG 1000ML POUR BTL (IV SOLUTION) ×2 IMPLANT
PACK BASIN DAY SURGERY FS (CUSTOM PROCEDURE TRAY) ×2 IMPLANT
PADDING CAST ABS 4INX4YD NS (CAST SUPPLIES) ×1
PADDING CAST ABS COTTON 4X4 ST (CAST SUPPLIES) ×1 IMPLANT
STOCKINETTE 4X48 STRL (DRAPES) ×2 IMPLANT
SUT ETHILON 4 0 PS 2 18 (SUTURE) ×2 IMPLANT
SYR BULB 3OZ (MISCELLANEOUS) ×2 IMPLANT
SYR CONTROL 10ML LL (SYRINGE) IMPLANT
TOWEL OR 17X24 6PK STRL BLUE (TOWEL DISPOSABLE) ×4 IMPLANT
UNDERPAD 30X30 (UNDERPADS AND DIAPERS) ×2 IMPLANT

## 2015-12-04 NOTE — Discharge Instructions (Addendum)
[facility] Instructions °Hand Surgery ° °Wound Care: °Keep your hand elevated above the level of your heart.  Do not allow it to dangle by your side.  Keep the dressing dry and do not remove it unless your doctor advises you to do so.  He will usually change it at the time of your post-op visit.  Moving your fingers is advised to stimulate circulation but will depend on the site of your surgery.  If you have a splint applied, your doctor will advise you regarding movement. ° °Activity: °Do not drive or operate machinery today.  Rest today and then you may return to your normal activity and work as indicated by your physician. ° °Diet:  °Drink liquids today or eat a light diet.  You may resume a regular diet tomorrow.   ° ° ° °Post Anesthesia Home Care Instructions ° °Activity: °Get plenty of rest for the remainder of the day. A responsible adult should stay with you for 24 hours following the procedure.  °For the next 24 hours, DO NOT: °-Drive a car °-Operate machinery °-Drink alcoholic beverages °-Take any medication unless instructed by your physician °-Make any legal decisions or sign important papers. ° °Meals: °Start with liquid foods such as gelatin or soup. Progress to regular foods as tolerated. Avoid greasy, spicy, heavy foods. If nausea and/or vomiting occur, drink only clear liquids until the nausea and/or vomiting subsides. Call your physician if vomiting continues. ° °Special Instructions/Symptoms: °Your throat may feel dry or sore from the anesthesia or the breathing tube placed in your throat during surgery. If this causes discomfort, gargle with warm salt water. The discomfort should disappear within 24 hours. ° °If you had a scopolamine patch placed behind your ear for the management of post- operative nausea and/or vomiting: ° °1. The medication in the patch is effective for 72 hours, after which it should be removed.  Wrap patch in a tissue and discard in the trash. Wash hands thoroughly with  soap and water. °2. You may remove the patch earlier than 72 hours if you experience unpleasant side effects which may include dry mouth, dizziness or visual disturbances. °3. Avoid touching the patch. Wash your hands with soap and water after contact with the patch. °  ° °General expectations: °Pain for two to three days. °Fingers may become slightly swollen. ° °Call your doctor if any of the following occur: °Severe pain not relieved by pain medication. °Elevated temperature. °Dressing soaked with blood. °Inability to move fingers. °White or bluish color to fingers. ° °

## 2015-12-04 NOTE — Anesthesia Preprocedure Evaluation (Addendum)
Anesthesia Evaluation  Patient identified by MRN, date of birth, ID band Patient awake    Reviewed: Allergy & Precautions, NPO status , Patient's Chart, lab work & pertinent test results  History of Anesthesia Complications Negative for: history of anesthetic complications  Airway Mallampati: III  TM Distance: >3 FB Neck ROM: Full    Dental no notable dental hx. (+) Dental Advisory Given   Pulmonary sleep apnea ,    Pulmonary exam normal breath sounds clear to auscultation       Cardiovascular hypertension, Normal cardiovascular exam Rhythm:Regular Rate:Normal     Neuro/Psych negative neurological ROS  negative psych ROS   GI/Hepatic negative GI ROS, Neg liver ROS,   Endo/Other  diabetes  Renal/GU negative Renal ROS  negative genitourinary   Musculoskeletal  (+) Arthritis ,   Abdominal   Peds negative pediatric ROS (+)  Hematology negative hematology ROS (+)   Anesthesia Other Findings   Reproductive/Obstetrics negative OB ROS                             Anesthesia Physical Anesthesia Plan  ASA: II  Anesthesia Plan: MAC and Bier Block   Post-op Pain Management:    Induction: Intravenous  Airway Management Planned:   Additional Equipment:   Intra-op Plan:   Post-operative Plan:   Informed Consent: I have reviewed the patients History and Physical, chart, labs and discussed the procedure including the risks, benefits and alternatives for the proposed anesthesia with the patient or authorized representative who has indicated his/her understanding and acceptance.   Dental advisory given  Plan Discussed with: CRNA  Anesthesia Plan Comments:         Anesthesia Quick Evaluation

## 2015-12-04 NOTE — Transfer of Care (Signed)
Immediate Anesthesia Transfer of Care Note  [name]  Procedure(s) Performed: Procedure(s) with comments: RIGHT CARPAL TUNNEL RELEASE (Right) - RIGHT CARPAL TUNNEL RELEASE  Patient Location: PACU  Anesthesia Type:MAC and Bier block  Level of Consciousness: awake, alert , oriented and patient cooperative  Airway & Oxygen Therapy: Patient Spontanous Breathing and Patient connected to face mask oxygen  Post-op Assessment: Report given to RN, Post -op Vital signs reviewed and stable and Patient moving all extremities  Post vital signs: Reviewed  Last Vitals:  Vitals:   12/04/15 0952  BP: (!) 154/90  Pulse: 77  Resp: 20  Temp: 36.8 C    Last Pain:  Vitals:   12/04/15 0952  TempSrc: Oral         Complications: No apparent anesthesia complications

## 2015-12-04 NOTE — Anesthesia Postprocedure Evaluation (Signed)
Anesthesia Post Note  [name]  Procedure(s) Performed: Procedure(s) (LRB): RIGHT CARPAL TUNNEL RELEASE (Right)  Patient location during evaluation: PACU Anesthesia Type: General Level of consciousness: awake and alert Pain management: pain level controlled Vital Signs Assessment: post-procedure vital signs reviewed and stable Respiratory status: spontaneous breathing, nonlabored ventilation, respiratory function stable and patient connected to nasal cannula oxygen Cardiovascular status: blood pressure returned to baseline and stable Postop Assessment: no signs of nausea or vomiting Anesthetic complications: no    Last Vitals:  Vitals:   12/04/15 1300 12/04/15 1330  BP: 130/90 (!) 147/99  Pulse: 72 63  Resp: (!) 21 18  Temp:  36.5 C    Last Pain:  Vitals:   12/04/15 1330  TempSrc: Oral  PainSc: 0-No pain                 Solaris Kram JENNETTE

## 2015-12-04 NOTE — Brief Op Note (Signed)
12/04/2015  12:00 PM  PATIENT:  Scott [name]  45 y.o. male  PRE-OPERATIVE DIAGNOSIS:  RIGHT CARPAL TUNNEL SYNDROME  POST-OPERATIVE DIAGNOSIS:  RIGHT CARPAL TUNNEL SYNDROME  PROCEDURE:  Procedure(s) with comments: RIGHT CARPAL TUNNEL RELEASE (Right) - RIGHT CARPAL TUNNEL RELEASE  SURGEON:  Surgeon(s) and Role:    * Betha Loa, MD - Primary  PHYSICIAN ASSISTANT:   ASSISTANTS: none   ANESTHESIA:   Bier block with sedation  EBL:  Total I/O In: 700 [I.V.:700] Out: -   BLOOD ADMINISTERED:none  DRAINS: none   LOCAL MEDICATIONS USED:  MARCAINE     SPECIMEN:  No Specimen  DISPOSITION OF SPECIMEN:  N/A  COUNTS:  YES  TOURNIQUET:   Total Tourniquet Time Documented: Forearm (Right) - 32 minutes Total: Forearm (Right) - 32 minutes   DICTATION: .Other Dictation: Dictation Number [id number]  PLAN OF CARE: Discharge to home after PACU  PATIENT DISPOSITION:  PACU - hemodynamically stable.

## 2015-12-04 NOTE — H&P (Signed)
Scott [name] is an 45 y.o. male.   Chief Complaint: right carpal tunnel syndrome HPI: 45 yo rhd male with numbness and tingling in fingers.  It is bothersome to him.  Positive nerve conduction studies.  He wishes to have a carpal tunnel release for management of his symptoms.  Allergies: Not on File  Past Medical History:  Diagnosis Date  . Carpal tunnel syndrome   . Diabetes mellitus   . DVT (deep venous thrombosis) ([facility])   . Hypertension   . Obesity   . Shoulder pain, bilateral   . Sleep apnea    was fitted for mask and never got machine - to costly    Past Surgical History:  Procedure Laterality Date  . right hand surgery    . ROTATOR CUFF REPAIR      Family History: Family History  Problem Relation Age of Onset  . Hypertension Mother   . Kidney disease Father   . Hypertension Other   . Hyperlipidemia Other   . Diabetes Other     Social History:   reports that he has never smoked. He has never used smokeless tobacco. He reports that he does not drink alcohol or use drugs.  Medications: Medications Prior to Admission  Medication Sig Dispense Refill  . aspirin EC 81 MG tablet Take 81 mg by mouth daily.    [name] atorvastatin (LIPITOR) 20 MG tablet Take 1 tablet (20 mg total) by mouth daily. 90 tablet 3  . fluticasone (FLONASE) 50 MCG/ACT nasal spray USE 2 SPRAYS IN EACH NOSTRIL ONCE DAILY 16 g 5  . gabapentin (NEURONTIN) 300 MG capsule Take 1 capsule (300 mg total) by mouth 3 (three) times daily. 90 capsule 1  . Linagliptin-Metformin HCl (JENTADUETO) 2.08-998 MG TABS Take 1 tablet by mouth daily. Needs office visit for refills 30 tablet 0  . lisinopril (PRINIVIL,ZESTRIL) 10 MG tablet Take 1 tablet (10 mg total) by mouth daily. 60 tablet 4  . loratadine (CLARITIN) 10 MG tablet Take 1 tablet (10 mg total) by mouth daily. 30 tablet 1  . nitroGLYCERIN (NITRODUR - DOSED IN MG/24 HR) 0.2 mg/hr patch Apply 1/4th patch to affected elbow, change daily 30 patch 1     Results for orders placed or performed during the hospital encounter of 12/04/15 (from the past 48 hour(s))  Basic metabolic panel     Status: Abnormal   Collection Time: 12/02/15 11:10 AM  Result Value Ref Range   Sodium 139 135 - 145 mmol/L   Potassium 4.1 3.5 - 5.1 mmol/L   Chloride 105 101 - 111 mmol/L   CO2 26 22 - 32 mmol/L   Glucose, Bld 131 (H) 65 - 99 mg/dL   BUN 14 6 - 20 mg/dL   Creatinine, Ser 1.16 0.61 - 1.24 mg/dL   Calcium 9.6 8.9 - 10.3 mg/dL   GFR calc non Af Amer >60 >60 mL/min   GFR calc Af Amer >60 >60 mL/min    Comment: (NOTE) The eGFR has been calculated using the CKD EPI equation. This calculation has not been validated in all clinical situations. eGFR's persistently <60 mL/min signify possible Chronic Kidney Disease.    Anion gap 8 5 - 15    No results found.   A comprehensive review of systems was negative except for: Musculoskeletal: positive for arthralgias Neurological: positive for paresthesia  Height 5' 6" (1.676 m), weight 107 kg (236 lb).  General appearance: alert, cooperative and appears stated age Head: Normocephalic, without obvious abnormality, atraumatic  Neck: supple, symmetrical, trachea midline Resp: clear to auscultation bilaterally Cardio: regular rate and rhythm GI: non-tender Extremities: Intact sensation and capillary refill all digits.  +epl/fpl/io.  No wounds.  Pulses: 2+ and symmetric Skin: Skin color, texture, turgor normal. No rashes or lesions Neurologic: Grossly normal Incision/Wound:none  Assessment/Plan Right carpal tunnel syndrome.  Non operative and operative treatment options were discussed with the patient and patient wishes to proceed with operative treatment. Risks, benefits, and alternatives of surgery were discussed and the patient agrees with the plan of care.   KUZMA,KEVIN R 12/04/2015, 9:42 AM

## 2015-12-04 NOTE — Op Note (Signed)
NAME:  Scott [name], Scott [name] NO.:  [id number]  MEDICAL RECORD NO.:  [id number]  LOCATION:                                 FACILITY:  PHYSICIAN:  Betha Loa, MD        DATE OF BIRTH:  07/12/1970  DATE OF PROCEDURE:  12/04/2015 DATE OF DISCHARGE:                              OPERATIVE REPORT   PREOPERATIVE DIAGNOSIS:  Right carpal tunnel syndrome.  POSTOPERATIVE DIAGNOSIS:  Right carpal tunnel syndrome.  PROCEDURE:  Right carpal tunnel release.  SURGEON:  Betha Loa, MD  ASSISTANT:  None.  ANESTHESIA:  Bier block with sedation.  IV FLUIDS:  Per anesthesia flow sheet.  ESTIMATED BLOOD LOSS:  Minimal.  COMPLICATIONS:  None.  SPECIMENS:  None.  TOURNIQUET TIME:  32 minutes.  DISPOSITION:  Stable to PACU.  INDICATIONS:  Scott [name] is a 45 year old male with tingling sensation in the right hand.  He has positive nerve conduction studies.  He wishes to have a carpal tunnel release for management of symptoms.  Risks, benefits, and alternatives of surgery were discussed including risk of blood loss; infection; damage to nerves, vessels, tendons, ligaments, bone; failure of surgery; need for additional surgery; complications with wound healing; continued pain; recurrence of carpal tunnel syndrome, and damage to motor branch.  He voiced understanding of these risks and elected to proceed.  OPERATIVE COURSE:  After being identified preoperatively by myself, the patient and I agreed upon procedure and site of procedure.  Surgical site was marked.  The risks, benefits, and alternatives of surgery were reviewed and he wished to proceed.  Surgical consent had been signed. He was given IV Ancef as preoperative antibiotic prophylaxis.  He was transported to the operating room and placed on the operating table in a supine position with the right upper extremity on arm board.  Bier block anesthesia was induced by anesthesiologist.  Right upper extremity was prepped  and draped in normal sterile orthopedic fashion.  Surgical pause was performed between surgeons, anesthesia, and operating room staff, and all were in agreement as to the patient, procedure, and site of procedure.  Tourniquet at the proximal aspect of the forearm had been inflated for the Bier block.  Incision was made over the transverse carpal ligament, carried into the subcutaneous tissues by spreading technique.  Bipolar electrocautery was used to obtain hemostasis.  The palmar fascia was sharply incised.  Transverse carpal ligament was identified and sharply incised.  It was incised distally first.  Care was taken to ensure complete decompression distally.  It was then incised proximally.  Scissors were used to split the distal aspect of the volar antebrachial fascia.  Finger was placed into the wound to ensure complete decompression, which was the case.  A single band of tissue was felt, which was released with the scissors as well.  The nerve was inspected.  It was flattened and hyperemic.  The motor branch was identified and was intact.  The wound was copiously irrigated with sterile saline and closed with 4-0 nylon in horizontal mattress fashion. It was injected with 10 mL of 0.25% plain Marcaine to aid in postoperative analgesia.  It was then dressed with sterile Xeroform, 4x4s,  ABD, and wrapped with Kerlix and Ace bandage.  Tourniquet was deflated at 32 minutes.  Fingertips were pink with brisk capillary refill after deflation of the tourniquet.  Operative drapes were broken down and the patient was awoken from anesthesia safely.  He was transferred back to the stretcher and taken to PACU in stable condition. I will see him back in the office 1 week for postoperative followup.  We will give him Norco 5/325, one to two p.o. q.6 hours p.r.n. pain, dispensed #20.     Betha Loa, MD     KK/MEDQ  D:  12/04/2015  T:  12/04/2015  Job:  [id number]

## 2015-12-04 NOTE — Anesthesia Procedure Notes (Signed)
Procedure Name: MAC Date/Time: 12/04/2015 11:20 AM Performed by: Curly [place] Pre-anesthesia Checklist: Patient identified, Emergency Drugs available, Suction available and Patient being monitored Patient Re-evaluated:Patient Re-evaluated prior to inductionOxygen Delivery Method: Simple face mask Preoxygenation: Pre-oxygenation with 100% oxygen Intubation Type: IV induction Ventilation: Mask ventilation without difficulty Dental Injury: Teeth and Oropharynx as per pre-operative assessment

## 2015-12-04 NOTE — Op Note (Signed)
[id number] 

## 2015-12-05 ENCOUNTER — Encounter (HOSPITAL_BASED_OUTPATIENT_CLINIC_OR_DEPARTMENT_OTHER): Payer: Self-pay | Admitting: Orthopedic Surgery

## 2015-12-09 ENCOUNTER — Other Ambulatory Visit: Payer: Self-pay | Admitting: Pharmacist

## 2015-12-09 DIAGNOSIS — E785 Hyperlipidemia, unspecified: Secondary | ICD-10-CM

## 2015-12-09 MED ORDER — ATORVASTATIN CALCIUM 20 MG PO TABS: 20.0000 mg | ORAL_TABLET | Freq: Every day | ORAL | 0 refills | Status: DC

## 2015-12-12 ENCOUNTER — Telehealth: Payer: Self-pay | Admitting: Family Medicine

## 2015-12-12 NOTE — Telephone Encounter (Signed)
Yes, please schedule appointment next week.  Thanks!

## 2015-12-15 ENCOUNTER — Ambulatory Visit (INDEPENDENT_AMBULATORY_CARE_PROVIDER_SITE_OTHER): Payer: 59 | Admitting: Family Medicine

## 2015-12-15 ENCOUNTER — Encounter: Payer: Self-pay | Admitting: Family Medicine

## 2015-12-15 VITALS — BP 144/100 | HR 80 | Ht 66.0 in | Wt 232.0 lb

## 2015-12-15 DIAGNOSIS — M25521 Pain in right elbow: Secondary | ICD-10-CM

## 2015-12-15 DIAGNOSIS — M7701 Medial epicondylitis, right elbow: Secondary | ICD-10-CM

## 2015-12-15 MED ORDER — METHYLPREDNISOLONE ACETATE 40 MG/ML IJ SUSP
40.0000 mg | Freq: Once | INTRAMUSCULAR | Status: AC
Start: 2015-12-15 — End: 2015-12-15
  Administered 2015-12-15: 40 mg via INTRA_ARTICULAR

## 2015-12-16 NOTE — Assessment & Plan Note (Signed)
Exam otherwise benign.  He is doing home exercises, nitro, sleeve without much benefit.  He would like to go ahead with injection which was given today.  Consider physical therapy, increased nitro dosage if not improving.  After informed written consent patient was seated in chair in exam room.  Area overlying painful area just distal to right medial epicondyle prepped with alcohol swab then injected with 2:1 marcaine: depomedrol with multiple needle fenestrations.  Patient tolerated procedure well without immediate complications.

## 2015-12-16 NOTE — Telephone Encounter (Signed)
Appointment scheduled.

## 2015-12-16 NOTE — Progress Notes (Signed)
PCP: Ambrose FinlandValerie A Keck, NP  Subjective:   HPI: Patient is a 45 y.o. male here for right elbow pain.  7/13: Patient returns with a 10 day history of medial right elbow pain. Started hurting the same day he was putting up a portable shed. Pain level 7/10 at rest, up to 10/10 with full extension and a lot of activity. No acute injury or trauma. Pain is sharp. No swelling, bruising. No skin changes, numbness.  8/14: Patient returns with continued medial right elbow pain. Pain severe 10/10 with movements of elbow. Worse when straightening out elbow. No new injury. No skin changes, numbness.  Past Medical History:  Diagnosis Date  . Carpal tunnel syndrome   . Diabetes mellitus   . DVT (deep venous thrombosis) (HCC)   . Hypertension   . Obesity   . Shoulder pain, bilateral   . Sleep apnea    was fitted for mask and never got machine - to costly    Current Outpatient Prescriptions on File Prior to Visit  Medication Sig Dispense Refill  . aspirin EC 81 MG tablet Take 81 mg by mouth daily.    [name]. atorvastatin (LIPITOR) 20 MG tablet Take 1 tablet (20 mg total) by mouth daily. 30 tablet 0  . fluticasone (FLONASE) 50 MCG/ACT nasal spray USE 2 SPRAYS IN EACH NOSTRIL ONCE DAILY 16 g 5  . gabapentin (NEURONTIN) 300 MG capsule Take 1 capsule (300 mg total) by mouth 3 (three) times daily. 90 capsule 1  . HYDROcodone-acetaminophen (NORCO) 5-325 MG tablet 1-2 tabs po q6 hours prn pain 20 tablet 0  . Linagliptin-Metformin HCl (JENTADUETO) 2.08-998 MG TABS Take 1 tablet by mouth daily. Needs office visit for refills 30 tablet 0  . lisinopril (PRINIVIL,ZESTRIL) 10 MG tablet Take 1 tablet (10 mg total) by mouth daily. 60 tablet 4  . loratadine (CLARITIN) 10 MG tablet Take 1 tablet (10 mg total) by mouth daily. 30 tablet 1  . nitroGLYCERIN (NITRODUR - DOSED IN MG/24 HR) 0.2 mg/hr patch Apply 1/4th patch to affected elbow, change daily 30 patch 1   No current facility-administered medications on file  prior to visit.     Past Surgical History:  Procedure Laterality Date  . CARPAL TUNNEL RELEASE Right 12/04/2015   Procedure: RIGHT CARPAL TUNNEL RELEASE;  Surgeon: Betha LoaKevin Kuzma, MD;  Location: [facility];  Service: Orthopedics;  Laterality: Right;  RIGHT CARPAL TUNNEL RELEASE  . right hand surgery    . ROTATOR CUFF REPAIR      No Known Allergies  Social History   Social History  . Marital status: Married    Spouse name: N/A  . Number of children: N/A  . Years of education: 12+   Occupational History  . real estate agent    Social History Main Topics  . Smoking status: Never Smoker  . Smokeless tobacco: Never Used  . Alcohol use No  . Drug use: No  . Sexual activity: Not on file   Other Topics Concern  . Not on file   Social History Narrative   Lives at home.   Right-handed.   Occasional caffeine use.    Family History  Problem Relation Age of Onset  . Hypertension Mother   . Kidney disease Father   . Hypertension Other   . Hyperlipidemia Other   . Diabetes Other     BP (!) 144/100   Pulse 80   Ht 5\' 6"  (1.676 m)   Wt 232 lb (105.2 kg)  BMI 37.45 kg/m   Review of Systems: See HPI above.    Objective:  Physical Exam:  Gen: NAD, comfortable in exam room  Right elbow: No gross deformity, swelling, bruising. TTP medial epicondyle. FROM elbow and wrist with pain on full elbow extension. Strength 5/5 all motions of elbow and wrist. Collateral ligaments intact. Negative tinels cubital tunnel. NVI distally.  Left elbow: FROM without pain.    Assessment & Plan:  1. Right medial epicondylitis - Exam otherwise benign.  He is doing home exercises, nitro, sleeve without much benefit.  He would like to go ahead with injection which was given today.  Consider physical therapy, increased nitro dosage if not improving.  After informed written consent patient was seated in chair in exam room.  Area overlying painful area just distal to right  medial epicondyle prepped with alcohol swab then injected with 2:1 marcaine: depomedrol with multiple needle fenestrations.  Patient tolerated procedure well without immediate complications.

## 2015-12-27 ENCOUNTER — Ambulatory Visit (HOSPITAL_COMMUNITY)
Admission: EM | Admit: 2015-12-27 | Discharge: 2015-12-27 | Disposition: A | Discharge status: Home / Self Care | Payer: 59 | Attending: Emergency Medicine | Admitting: Emergency Medicine

## 2015-12-27 ENCOUNTER — Encounter (HOSPITAL_COMMUNITY): Payer: Self-pay | Admitting: Emergency Medicine

## 2015-12-27 DIAGNOSIS — H578 Other specified disorders of eye and adnexa: Secondary | ICD-10-CM

## 2015-12-27 DIAGNOSIS — H109 Unspecified conjunctivitis: Secondary | ICD-10-CM

## 2015-12-27 DIAGNOSIS — H5789 Other specified disorders of eye and adnexa: Secondary | ICD-10-CM

## 2015-12-27 MED ORDER — POLYMYXIN B-TRIMETHOPRIM 10000-0.1 UNIT/ML-% OP SOLN: 1.0000 [drp] | Freq: Four times a day (QID) | OPHTHALMIC | 0 refills | Status: DC

## 2015-12-27 MED ORDER — KETOTIFEN FUMARATE 0.025 % OP SOLN: 1.0000 [drp] | Freq: Two times a day (BID) | OPHTHALMIC | 0 refills | Status: DC

## 2015-12-27 NOTE — ED Provider Notes (Signed)
CSN: [id number]     Arrival date & time 12/27/15  1703 History   First MD Initiated Contact with Patient 12/27/15 1920     Chief Complaint  Patient presents with  . Eye Problem   (Consider location/radiation/quality/duration/timing/severity/associated sxs/prior Treatment) 45 year old male presents to the urgent care after having a one-day history of irritation of the right eye associated with photophobia and some clear tearing. Denies trauma, sensation of foreign body or vision problems. Gradual onset.      Past Medical History:  Diagnosis Date  . Carpal tunnel syndrome   . Diabetes mellitus   . DVT (deep venous thrombosis) (HCC)   . Hypertension   . Obesity   . Shoulder pain, bilateral   . Sleep apnea    was fitted for mask and never got machine - to costly   Past Surgical History:  Procedure Laterality Date  . CARPAL TUNNEL RELEASE Right 12/04/2015   Procedure: RIGHT CARPAL TUNNEL RELEASE;  Surgeon: Betha Loa, MD;  Location: [facility];  Service: Orthopedics;  Laterality: Right;  RIGHT CARPAL TUNNEL RELEASE  . right hand surgery    . ROTATOR CUFF REPAIR     Family History  Problem Relation Age of Onset  . Hypertension Mother   . Kidney disease Father   . Hypertension Other   . Hyperlipidemia Other   . Diabetes Other    Social History  Substance Use Topics  . Smoking status: Never Smoker  . Smokeless tobacco: Never Used  . Alcohol use No    Review of Systems  Constitutional: Negative.   HENT: Negative.   Eyes: Positive for photophobia and discharge. Negative for redness and visual disturbance.  Respiratory: Negative.   Neurological: Negative.   All other systems reviewed and are negative.   Allergies  Review of patient's allergies indicates no known allergies.  Home Medications   Prior to Admission medications   Medication Sig Start Date End Date Taking? Authorizing Provider  aspirin EC 81 MG tablet Take 81 mg by mouth daily.   Yes  Historical Provider, MD  atorvastatin (LIPITOR) 20 MG tablet Take 1 tablet (20 mg total) by mouth daily. 12/09/15  Yes Quentin Angst, MD  fluticasone (FLONASE) 50 MCG/ACT nasal spray USE 2 SPRAYS IN EACH NOSTRIL ONCE DAILY 02/26/15  Yes Ambrose Finland, NP  gabapentin (NEURONTIN) 300 MG capsule Take 1 capsule (300 mg total) by mouth 3 (three) times daily. 05/07/15  Yes Lenda Kelp, MD  HYDROcodone-acetaminophen (NORCO) 5-325 MG tablet 1-2 tabs po q6 hours prn pain 12/04/15  Yes Betha Loa, MD  Linagliptin-Metformin HCl (JENTADUETO) 2.08-998 MG TABS Take 1 tablet by mouth daily. Needs office visit for refills 08/08/15  Yes Ambrose Finland, NP  lisinopril (PRINIVIL,ZESTRIL) 10 MG tablet Take 1 tablet (10 mg total) by mouth daily. 04/03/15  Yes Ambrose Finland, NP  loratadine (CLARITIN) 10 MG tablet Take 1 tablet (10 mg total) by mouth daily. 04/03/15  Yes Ambrose Finland, NP  nitroGLYCERIN (NITRODUR - DOSED IN MG/24 HR) 0.2 mg/hr patch Apply 1/4th patch to affected elbow, change daily 11/17/15  Yes Lenda Kelp, MD  ketotifen (ZADITOR) 0.025 % ophthalmic solution Place 1 drop into both eyes 2 (two) times daily. 12/27/15   Hayden Rasmussen, NP  trimethoprim-polymyxin b (POLYTRIM) ophthalmic solution Place 1 drop into both eyes every 6 (six) hours. 12/27/15   Hayden Rasmussen, NP   Meds Ordered and Administered this Visit  Medications - No data to display  BP 134/88 (BP Location: Left Arm)   Pulse 92   Temp 98.9 F (37.2 C) (Oral)   Resp 16   SpO2 100%  No data found.   Physical Exam  Constitutional: He is oriented to person, place, and time. He appears well-developed and well-nourished. No distress.  Eyes: EOM are normal. Pupils are equal, round, and reactive to light.  Right lower conjunctiva erythematous. Lesser erythema to the upper lid conjunctiva. Sclera clear. Anterior chamber clear. No hyphema or cloudiness. Under magnification no foreign bodies or apparent defects in the cornea or sclera. The  surface of the lower conjunctiva has a rough and inflamed texture. Some clear tearing during the exam.  Left eye with similar features to the lower lid conjunctiva.  Neck: Normal range of motion. Neck supple.  Cardiovascular: Normal rate.   Pulmonary/Chest: Effort normal. No respiratory distress.  Musculoskeletal: He exhibits no edema.  Neurological: He is alert and oriented to person, place, and time. He exhibits normal muscle tone.  Skin: Skin is warm and dry.  Psychiatric: He has a normal mood and affect.  Nursing note and vitals reviewed.   Urgent Care Course   Clinical Course    Procedures (including critical care time)  Labs Review Labs Reviewed - No data to display  Imaging Review No results found.   Visual Acuity Review  Right Eye Distance:   Left Eye Distance:   Bilateral Distance:    Right Eye Near:   Left Eye Near:    Bilateral Near:         MDM   1. Bilateral conjunctivitis   2. Eye irritation    Meds ordered this encounter  Medications  . trimethoprim-polymyxin b (POLYTRIM) ophthalmic solution    Sig: Place 1 drop into both eyes every 6 (six) hours.    Dispense:  10 mL    Refill:  0    Order Specific Question:   Supervising Provider    Answer:   Eustace MooreMURRAY, LAURA W [[id number]]  . ketotifen (ZADITOR) 0.025 % ophthalmic solution    Sig: Place 1 drop into both eyes 2 (two) times daily.    Dispense:  5 mL    Refill:  0    Order Specific Question:   Supervising Provider    Answer:   Eustace MooreMURRAY, LAURA W [[id number]]   Also recommend Sustaine eye drops for lubrication.    Hayden Rasmussenavid Mesa Janus, NP 12/27/15 1949    Hayden Rasmussenavid Barrie Sigmund, NP 12/27/15 [phone]

## 2015-12-27 NOTE — ED Triage Notes (Signed)
Here for left eye irritation onset yest associated w/redness and sensitive to BL  Denies injt/rauma.... Reports he was here in 10/2015 and was treated for pink eye  A&O x4... NAD

## 2016-01-14 ENCOUNTER — Other Ambulatory Visit: Payer: Self-pay | Admitting: Pharmacist

## 2016-01-14 MED ORDER — FLUTICASONE PROPIONATE 50 MCG/ACT NA SUSP: 2.0000 | Freq: Every day | NASAL | 0 refills | Status: DC

## 2016-02-04 ENCOUNTER — Other Ambulatory Visit: Payer: Self-pay | Admitting: Internal Medicine

## 2016-02-04 DIAGNOSIS — E785 Hyperlipidemia, unspecified: Secondary | ICD-10-CM

## 2016-02-11 ENCOUNTER — Other Ambulatory Visit: Payer: Self-pay | Admitting: Orthopedic Surgery

## 2016-02-24 ENCOUNTER — Encounter (HOSPITAL_COMMUNITY): Payer: Self-pay | Admitting: Emergency Medicine

## 2016-02-24 ENCOUNTER — Ambulatory Visit (HOSPITAL_COMMUNITY)
Admission: EM | Admit: 2016-02-24 | Discharge: 2016-02-24 | Disposition: A | Discharge status: Home / Self Care | Payer: 59 | Attending: Family Medicine | Admitting: Family Medicine

## 2016-02-24 DIAGNOSIS — B309 Viral conjunctivitis, unspecified: Secondary | ICD-10-CM

## 2016-02-24 MED ORDER — NAPHAZOLINE-PHENIRAMINE 0.025-0.3 % OP SOLN: 1.0000 [drp] | OPHTHALMIC | 0 refills | Status: DC | PRN

## 2016-02-24 NOTE — ED Triage Notes (Signed)
Reports symptoms for a week .  Patient has noticed eyes crusty in am.  Right eye is sore.  Reports eyes feel dry.  Patient does wear prescription glasses, no contacts.  Denies cough, cold, or runny nose

## 2016-02-24 NOTE — ED Provider Notes (Signed)
CSN: [id number]     Arrival date & time 02/24/16  1729 History   First MD Initiated Contact with Patient 02/24/16 1809     Chief Complaint  Patient presents with  . Eye Problem   (Consider location/radiation/quality/duration/timing/severity/associated sxs/prior Treatment) HPI NP BILATERAL EYE SXS FOR AT LEAST A WEEK. CRUSTY EYES IN THE AM, WITH SENSATION OF DRYNESS. NO ACUTE VISION CHANGES.  Past Medical History:  Diagnosis Date  . Carpal tunnel syndrome   . Diabetes mellitus   . DVT (deep venous thrombosis) (HCC)   . Hypertension   . Obesity   . Shoulder pain, bilateral   . Sleep apnea    was fitted for mask and never got machine - to costly   Past Surgical History:  Procedure Laterality Date  . CARPAL TUNNEL RELEASE Right 12/04/2015   Procedure: RIGHT CARPAL TUNNEL RELEASE;  Surgeon: Betha LoaKevin Kuzma, MD;  Location: [facility];  Service: Orthopedics;  Laterality: Right;  RIGHT CARPAL TUNNEL RELEASE  . right hand surgery    . ROTATOR CUFF REPAIR     Family History  Problem Relation Age of Onset  . Hypertension Mother   . Kidney disease Father   . Hypertension Other   . Hyperlipidemia Other   . Diabetes Other    Social History  Substance Use Topics  . Smoking status: Never Smoker  . Smokeless tobacco: Never Used  . Alcohol use No    Review of Systems  Denies: HEADACHE, NAUSEA, ABDOMINAL PAIN, CHEST PAIN, CONGESTION, DYSURIA, SHORTNESS OF BREATH  Allergies  Review of patient's allergies indicates no known allergies.  Home Medications   Prior to Admission medications   Medication Sig Start Date End Date Taking? Authorizing Provider  aspirin EC 81 MG tablet Take 81 mg by mouth daily.   Yes Historical Provider, MD  atorvastatin (LIPITOR) 20 MG tablet TAKE ONE TABLET BY MOUTH DAILY 02/04/16  Yes Quentin Angstlugbemiga E Jegede, MD  gabapentin (NEURONTIN) 300 MG capsule Take 1 capsule (300 mg total) by mouth 3 (three) times daily. 05/07/15  Yes Lenda KelpShane R Hudnall, MD   Linagliptin-Metformin HCl (JENTADUETO) 2.08-998 MG TABS Take 1 tablet by mouth daily. Needs office visit for refills 08/08/15  Yes Ambrose FinlandValerie A Keck, NP  lisinopril (PRINIVIL,ZESTRIL) 10 MG tablet Take 1 tablet (10 mg total) by mouth daily. 04/03/15  Yes Ambrose FinlandValerie A Keck, NP  loratadine (CLARITIN) 10 MG tablet Take 1 tablet (10 mg total) by mouth daily. 04/03/15  Yes Ambrose FinlandValerie A Keck, NP  fluticasone (FLONASE) 50 MCG/ACT nasal spray Place 2 sprays into both nostrils daily. 01/14/16   Quentin Angstlugbemiga E Jegede, MD  HYDROcodone-acetaminophen (NORCO) 5-325 MG tablet 1-2 tabs po q6 hours prn pain Patient not taking: Reported on 02/24/2016 12/04/15   Betha LoaKevin Kuzma, MD  ketotifen (ZADITOR) 0.025 % ophthalmic solution Place 1 drop into both eyes 2 (two) times daily. 12/27/15   Hayden Rasmussenavid Mabe, NP  nitroGLYCERIN (NITRODUR - DOSED IN MG/24 HR) 0.2 mg/hr patch Apply 1/4th patch to affected elbow, change daily 11/17/15   Lenda KelpShane R Hudnall, MD  trimethoprim-polymyxin b (POLYTRIM) ophthalmic solution Place 1 drop into both eyes every 6 (six) hours. Patient not taking: Reported on 02/24/2016 12/27/15   Hayden Rasmussenavid Mabe, NP   Meds Ordered and Administered this Visit  Medications - No data to display  BP (!) 155/106 (BP Location: Left Arm) Comment (BP Location): large cuff  Pulse 86   Temp 98.4 F (36.9 C) (Oral)   Resp 20   SpO2 98%  No data  found.   Physical Exam NURSES NOTES AND VITAL SIGNS REVIEWED. CONSTITUTIONAL: Well developed, well nourished, no acute distress HEENT: normocephalic, atraumatic EYES: Conjunctiva normal NECK:normal ROM, supple, no adenopathy PULMONARY:No respiratory distress, normal effort ABDOMINAL: Soft, ND, NT BS+, No CVAT MUSCULOSKELETAL: Normal ROM of all extremities,  SKIN: warm and dry without rash PSYCHIATRIC: Mood and affect, behavior are normal  Urgent Care Course   Clinical Course    Procedures (including critical care time)  Labs Review Labs Reviewed - No data to display  Imaging  Review No results found.   Visual Acuity Review  Right Eye Distance:   Left Eye Distance:   Bilateral Distance:    Right Eye Near:   Left Eye Near:    Bilateral Near:         MDM   1. Viral conjunctivitis of both eyes     Patient is reassured that there are no issues that require transfer to higher level of care at this time or additional tests. Patient is advised to continue home symptomatic treatment. Patient is advised that if there are new or worsening symptoms to attend the emergency department, contact primary care provider, or return to UC. Instructions of care provided discharged home in stable condition.    THIS NOTE WAS GENERATED USING A VOICE RECOGNITION SOFTWARE PROGRAM. ALL REASONABLE EFFORTS  WERE MADE TO PROOFREAD THIS DOCUMENT FOR ACCURACY.  I have verbally reviewed the discharge instructions with the patient. A printed AVS was given to the patient.  All questions were answered prior to discharge.      Tharon Aquas, PA 02/24/16 2044

## 2016-03-04 ENCOUNTER — Emergency Department (HOSPITAL_COMMUNITY)
Admission: EM | Admit: 2016-03-04 | Discharge: 2016-03-04 | Disposition: A | Discharge status: Home / Self Care | Payer: 59 | Attending: Emergency Medicine | Admitting: Emergency Medicine

## 2016-03-04 ENCOUNTER — Emergency Department (HOSPITAL_COMMUNITY): Payer: 59

## 2016-03-04 ENCOUNTER — Other Ambulatory Visit: Payer: Self-pay | Admitting: Internal Medicine

## 2016-03-04 ENCOUNTER — Encounter (HOSPITAL_COMMUNITY): Payer: Self-pay | Admitting: Emergency Medicine

## 2016-03-04 DIAGNOSIS — I10 Essential (primary) hypertension: Secondary | ICD-10-CM | POA: Diagnosis not present

## 2016-03-04 DIAGNOSIS — E785 Hyperlipidemia, unspecified: Secondary | ICD-10-CM

## 2016-03-04 DIAGNOSIS — Z79899 Other long term (current) drug therapy: Secondary | ICD-10-CM | POA: Diagnosis not present

## 2016-03-04 DIAGNOSIS — E119 Type 2 diabetes mellitus without complications: Secondary | ICD-10-CM | POA: Diagnosis not present

## 2016-03-04 DIAGNOSIS — R079 Chest pain, unspecified: Secondary | ICD-10-CM | POA: Diagnosis present

## 2016-03-04 DIAGNOSIS — J189 Pneumonia, unspecified organism: Secondary | ICD-10-CM | POA: Diagnosis not present

## 2016-03-04 DIAGNOSIS — Z7982 Long term (current) use of aspirin: Secondary | ICD-10-CM | POA: Insufficient documentation

## 2016-03-04 DIAGNOSIS — R0789 Other chest pain: Secondary | ICD-10-CM

## 2016-03-04 LAB — COMPREHENSIVE METABOLIC PANEL
ALBUMIN: 4.3 g/dL (ref 3.5–5.0)
ALT: 29 U/L (ref 17–63)
ANION GAP: 9 (ref 5–15)
AST: 26 U/L (ref 15–41)
Alkaline Phosphatase: 51 U/L (ref 38–126)
BUN: 16 mg/dL (ref 6–20)
CHLORIDE: 105 mmol/L (ref 101–111)
CO2: 26 mmol/L (ref 22–32)
CREATININE: 1.11 mg/dL (ref 0.61–1.24)
Calcium: 9.2 mg/dL (ref 8.9–10.3)
Glucose, Bld: 212 mg/dL — ABNORMAL HIGH (ref 65–99)
Potassium: 4 mmol/L (ref 3.5–5.1)
SODIUM: 140 mmol/L (ref 135–145)
Total Bilirubin: 1 mg/dL (ref 0.3–1.2)
Total Protein: 7.5 g/dL (ref 6.5–8.1)

## 2016-03-04 LAB — CBC WITH DIFFERENTIAL/PLATELET
BASOS PCT: 0 %
Basophils Absolute: 0 10*3/uL (ref 0.0–0.1)
EOS ABS: 0.1 10*3/uL (ref 0.0–0.7)
Eosinophils Relative: 1 %
HCT: 47.7 % (ref 39.0–52.0)
HEMOGLOBIN: 16.3 g/dL (ref 13.0–17.0)
LYMPHS ABS: 2.5 10*3/uL (ref 0.7–4.0)
Lymphocytes Relative: 30 %
MCH: 25.7 pg — AB (ref 26.0–34.0)
MCHC: 34.2 g/dL (ref 30.0–36.0)
MCV: 75.2 fL — ABNORMAL LOW (ref 78.0–100.0)
Monocytes Absolute: 0.7 10*3/uL (ref 0.1–1.0)
Monocytes Relative: 8 %
NEUTROS PCT: 61 %
Neutro Abs: 5.1 10*3/uL (ref 1.7–7.7)
Platelets: 203 10*3/uL (ref 150–400)
RBC: 6.34 MIL/uL — AB (ref 4.22–5.81)
RDW: 14.9 % (ref 11.5–15.5)
WBC: 8.4 10*3/uL (ref 4.0–10.5)

## 2016-03-04 LAB — I-STAT TROPONIN, ED: Troponin i, poc: 0 ng/mL (ref 0.00–0.08)

## 2016-03-04 MED ORDER — SODIUM CHLORIDE 0.9 % IV BOLUS (SEPSIS): 1000.0000 mL | Freq: Once | INTRAVENOUS | Status: DC

## 2016-03-04 MED ORDER — AZITHROMYCIN 250 MG PO TABS: 250.0000 mg | ORAL_TABLET | Freq: Every day | ORAL | 0 refills | Status: DC

## 2016-03-04 MED ORDER — AZITHROMYCIN 250 MG PO TABS
500.0000 mg | ORAL_TABLET | Freq: Once | ORAL | Status: AC
  Administered 2016-03-04: 500 mg via ORAL
  Filled 2016-03-04: qty 2

## 2016-03-04 NOTE — ED Triage Notes (Signed)
Pt reports cold for week and half and has cough. Been taking OTC cold meds. Chest pain in center and radiates to left comes and goes. Reports palpitations and went to urgent care and got an EKG which was normal. Reports that eating something makes pain worse. Movement in certain positions makes it worse. Reports Sunday was running fevers. Reports some dizziness when pain comes on.

## 2016-03-04 NOTE — ED Provider Notes (Signed)
[facility]-EMERGENCY DEPT Provider Note   CSN: [id number] Arrival date & time: 03/04/16  1254     History   Chief Complaint Chief Complaint  Patient presents with  . Chest Pain    HPI Lenor DerrickKenneth W Mooneyhan is a 45 y.o. male hx of DVT, PE off anticoagulation, here with cough, fever, palpitations. Patient states that he has been having cold symptoms for the last week and a half. He has been having some sinus congestion and has been using Flonase and Afrin with minimal relief. Has some productive cough as well. Also had some palpitations and went to urgent care about a week ago and an EKG that was unremarkable. Denies any shortness of breath or pleuritic chest pain that this is not similar to his previous PE. Denies any leg swelling. Does report some subjective fevers. He was at church 4 days ago and felt warm at that time but did not take his temperature. That he has some low-grade temperatures for the last several days as well. Denies any urinary symptoms or vomiting or abdominal pain. Denies any sick contacts.   The history is provided by the patient.    Past Medical History:  Diagnosis Date  . Carpal tunnel syndrome   . Diabetes mellitus   . DVT (deep venous thrombosis) (HCC)   . Hypertension   . Obesity   . Shoulder pain, bilateral   . Sleep apnea    was fitted for mask and never got machine - to costly    Patient Active Problem List   Diagnosis Date Noted  . Medial epicondylitis of right elbow 11/18/2015  . Paresthesia of both hands 05/13/2015  . Bilateral carpal tunnel syndrome 12/18/2014  . OSA (obstructive sleep apnea) 04/18/2012  . 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 ? DVT of popliteal vein, right (non occlusive) 09/03/2011  . Pulmonary embolism (HCC) 09/02/2011  . DM type 2 (diabetes mellitus, type 2) (HCC) 09/02/2011  . HTN (hypertension), benign 09/02/2011    Past Surgical History:  Procedure  Laterality Date  . CARPAL TUNNEL RELEASE Right 12/04/2015   Procedure: RIGHT CARPAL TUNNEL RELEASE;  Surgeon: Betha LoaKevin Kuzma, MD;  Location: [facility];  Service: Orthopedics;  Laterality: Right;  RIGHT CARPAL TUNNEL RELEASE  . right hand surgery    . ROTATOR CUFF REPAIR         Home Medications    Prior to Admission medications   Medication Sig Start Date End Date Taking? Authorizing Provider  aspirin EC 81 MG tablet Take 81 mg by mouth daily.    Historical Provider, MD  atorvastatin (LIPITOR) 20 MG tablet TAKE ONE TABLET BY MOUTH DAILY 02/04/16   Quentin Angstlugbemiga E Jegede, MD  fluticasone (FLONASE) 50 MCG/ACT nasal spray Place 2 sprays into both nostrils daily. 01/14/16   Quentin Angstlugbemiga E Jegede, MD  gabapentin (NEURONTIN) 300 MG capsule Take 1 capsule (300 mg total) by mouth 3 (three) times daily. 05/07/15   Lenda KelpShane R Hudnall, MD  HYDROcodone-acetaminophen (NORCO) 5-325 MG tablet 1-2 tabs po q6 hours prn pain Patient not taking: Reported on 02/24/2016 12/04/15   Betha LoaKevin Kuzma, MD  ketotifen (ZADITOR) 0.025 % ophthalmic solution Place 1 drop into both eyes 2 (two) times daily. 12/27/15   Hayden Rasmussenavid Mabe, NP  Linagliptin-Metformin HCl (JENTADUETO) 2.08-998 MG TABS Take 1 tablet by mouth daily. Needs office visit for refills 08/08/15   Ambrose FinlandValerie A Keck, NP  lisinopril (PRINIVIL,ZESTRIL) 10 MG tablet Take 1 tablet (10 mg total)  by mouth daily. 04/03/15   Ambrose FinlandValerie A Keck, NP  loratadine (CLARITIN) 10 MG tablet Take 1 tablet (10 mg total) by mouth daily. 04/03/15   Ambrose FinlandValerie A Keck, NP  naphazoline-pheniramine (NAPHCON-A) 0.025-0.3 % ophthalmic solution Place 1 drop into both eyes every 4 (four) hours as needed for irritation. 02/24/16   Tharon AquasFrank C Patrick, PA  nitroGLYCERIN (NITRODUR - DOSED IN MG/24 HR) 0.2 mg/hr patch Apply 1/4th patch to affected elbow, change daily 11/17/15   Lenda KelpShane R Hudnall, MD  trimethoprim-polymyxin b (POLYTRIM) ophthalmic solution Place 1 drop into both eyes every 6 (six) hours. Patient not  taking: Reported on 02/24/2016 12/27/15   Hayden Rasmussenavid Mabe, NP    Family History Family History  Problem Relation Age of Onset  . Hypertension Mother   . Kidney disease Father   . Hypertension Other   . Hyperlipidemia Other   . Diabetes Other     Social History Social History  Substance Use Topics  . Smoking status: Never Smoker  . Smokeless tobacco: Never Used  . Alcohol use No     Allergies   Review of patient's allergies indicates no known allergies.   Review of Systems Review of Systems  Cardiovascular: Positive for chest pain.  All other systems reviewed and are negative.    Physical Exam Updated Vital Signs BP (!) 149/109 (BP Location: Right Arm)   Pulse 98   Temp 97.8 F (36.6 C) (Oral)   Resp 18   SpO2 95%   Physical Exam  Constitutional: He appears well-developed and well-nourished.  HENT:  Head: Normocephalic.  Mouth/Throat: Oropharynx is clear and moist.  Eyes: EOM are normal. Pupils are equal, round, and reactive to light.  Neck: Normal range of motion. Neck supple.  Cardiovascular: Normal rate, regular rhythm and normal heart sounds.   Pulmonary/Chest: Effort normal. No respiratory distress.  Diminished bilateral bases, no obvious wheezing   Abdominal: Soft. Bowel sounds are normal. He exhibits no distension. There is no tenderness. There is no guarding.  Musculoskeletal: Normal range of motion.  No calf tenderness, no edema   Neurological: He is alert.  Skin: Skin is warm.  Psychiatric: He has a normal mood and affect.  Nursing note and vitals reviewed.    ED Treatments / Results  Labs (all labs ordered are listed, but only abnormal results are displayed) Labs Reviewed  COMPREHENSIVE METABOLIC PANEL - Abnormal; Notable for the following:       Result Value   Glucose, Bld 212 (*)    All other components within normal limits  CBC WITH DIFFERENTIAL/PLATELET - Abnormal; Notable for the following:    RBC 6.34 (*)    MCV 75.2 (*)    MCH 25.7  (*)    All other components within normal limits  URINALYSIS, ROUTINE W REFLEX MICROSCOPIC (NOT AT [facility])  I-STAT TROPOININ, ED    EKG  EKG Interpretation  Date/Time:  Thursday March 04 2016 13:08:05 EDT Ventricular Rate:  96 PR Interval:  142 QRS Duration: 84 QT Interval:  352 QTC Calculation: 444 R Axis:   61 Text Interpretation:  Normal sinus rhythm T wave abnormality, consider inferior ischemia Abnormal ECG No significant change since last tracing Confirmed by YAO  MD, DAVID ([id number]) on 03/04/2016 1:21:12 PM       Radiology Dg Chest 2 View  Result Date: 03/04/2016 CLINICAL DATA:  Pt states Mid to left chest pains x 1 week, will not go away, no cough or sob, no injury, non smoker, hx htn and  diabetes EXAM: CHEST  2 VIEW COMPARISON:  05/22/2015 FINDINGS: The heart size and mediastinal contours are within normal limits. Both lungs are clear. No pleural effusion or pneumothorax. The visualized skeletal structures are unremarkable. IMPRESSION: No active cardiopulmonary disease. Electronically Signed   By: Amie Portland M.D.   On: 03/04/2016 13:58    Procedures Procedures (including critical care time)  Medications Ordered in ED Medications  azithromycin (ZITHROMAX) tablet 500 mg (not administered)     Initial Impression / Assessment and Plan / ED Course  I have reviewed the triage vital signs and the nursing notes.  Pertinent labs & imaging results that were available during my care of the patient were reviewed by me and considered in my medical decision making (see chart for details).  Clinical Course    DANUEL FELICETTI is a 45 y.o. male here with chest pain, palpitations, fevers. Hx of PE but off anticoagulation. Never hypoxic or hypotensive. Well appearing overall. Not tachycardic and he states that this is different than previous PE. Likely bronchitis vs pneumonia. Will get CBC, labs, CXR, trop x 1 (symptoms for over a week).   2:24 PM WBC nl. I think I saw RML  infiltrate on CXR but was read as normal by radiology. WBC nl. Given subjective fevers and chills, will give azithromycin for possible atypical pneumonia. Never hypoxic. Was hypertensive in triage but BP is normal at discharge with no BP meds given in the ED. Will dc home.    Final Clinical Impressions(s) / ED Diagnoses   Final diagnoses:  None    New Prescriptions New Prescriptions   No medications on file     Charlynne Pander, MD 03/04/16 1446

## 2016-03-04 NOTE — Discharge Instructions (Signed)
Take azithromycin 250 mg daily for another 4 days.   Stay hydrated.   Take tylenol, motrin for fever.   See your doctor  Return to ER if you have fever for a week, chest pain, shortness of breath, vomiting.

## 2016-03-29 ENCOUNTER — Encounter (HOSPITAL_BASED_OUTPATIENT_CLINIC_OR_DEPARTMENT_OTHER): Payer: Self-pay | Admitting: *Deleted

## 2016-03-29 NOTE — Progress Notes (Signed)
   03/29/16 1221  OBSTRUCTIVE SLEEP APNEA  Have you ever been diagnosed with sleep apnea through a sleep study? Yes  If yes, do you have and use a CPAP or BPAP machine every night? 0  Do you snore loudly (loud enough to be heard through closed doors)?  1  Do you often feel tired, fatigued, or sleepy during the daytime (such as falling asleep during driving or talking to someone)? 0  Has anyone observed you stop breathing during your sleep? 1  Do you have, or are you being treated for high blood pressure? 1  BMI more than 35 kg/m2? 1  Age > 50 (1-yes) 0  Male Gender (Yes=1) 1  Obstructive Sleep Apnea Score 5

## 2016-04-01 ENCOUNTER — Encounter (HOSPITAL_BASED_OUTPATIENT_CLINIC_OR_DEPARTMENT_OTHER)
Admission: RE | Disposition: A | Discharge status: Home / Self Care | Payer: Self-pay | Source: Ambulatory Visit | Attending: Orthopedic Surgery

## 2016-04-01 ENCOUNTER — Ambulatory Visit (HOSPITAL_BASED_OUTPATIENT_CLINIC_OR_DEPARTMENT_OTHER): Payer: 59 | Admitting: Anesthesiology

## 2016-04-01 ENCOUNTER — Ambulatory Visit (HOSPITAL_BASED_OUTPATIENT_CLINIC_OR_DEPARTMENT_OTHER)
Admission: RE | Admit: 2016-04-01 | Discharge: 2016-04-01 | Disposition: A | Discharge status: Home / Self Care | Payer: 59 | Source: Ambulatory Visit | Attending: Orthopedic Surgery | Admitting: Orthopedic Surgery

## 2016-04-01 ENCOUNTER — Encounter (HOSPITAL_BASED_OUTPATIENT_CLINIC_OR_DEPARTMENT_OTHER): Payer: Self-pay | Admitting: *Deleted

## 2016-04-01 DIAGNOSIS — G4730 Sleep apnea, unspecified: Secondary | ICD-10-CM | POA: Insufficient documentation

## 2016-04-01 DIAGNOSIS — G5602 Carpal tunnel syndrome, left upper limb: Secondary | ICD-10-CM | POA: Insufficient documentation

## 2016-04-01 DIAGNOSIS — Z7982 Long term (current) use of aspirin: Secondary | ICD-10-CM | POA: Insufficient documentation

## 2016-04-01 DIAGNOSIS — E119 Type 2 diabetes mellitus without complications: Secondary | ICD-10-CM | POA: Diagnosis not present

## 2016-04-01 DIAGNOSIS — E669 Obesity, unspecified: Secondary | ICD-10-CM | POA: Diagnosis not present

## 2016-04-01 DIAGNOSIS — I10 Essential (primary) hypertension: Secondary | ICD-10-CM | POA: Insufficient documentation

## 2016-04-01 DIAGNOSIS — Z79899 Other long term (current) drug therapy: Secondary | ICD-10-CM | POA: Diagnosis not present

## 2016-04-01 DIAGNOSIS — Z6837 Body mass index (BMI) 37.0-37.9, adult: Secondary | ICD-10-CM | POA: Insufficient documentation

## 2016-04-01 HISTORY — PX: CARPAL TUNNEL RELEASE: SHX101

## 2016-04-01 LAB — GLUCOSE, CAPILLARY
GLUCOSE-CAPILLARY: 89 mg/dL (ref 65–99)
Glucose-Capillary: 108 mg/dL — ABNORMAL HIGH (ref 65–99)

## 2016-04-01 SURGERY — CARPAL TUNNEL RELEASE
Anesthesia: Regional | Site: Hand | Laterality: Left

## 2016-04-01 MED ORDER — SUCCINYLCHOLINE CHLORIDE 200 MG/10ML IV SOSY
PREFILLED_SYRINGE | INTRAVENOUS | Status: AC
  Filled 2016-04-01: qty 10

## 2016-04-01 MED ORDER — MIDAZOLAM HCL 2 MG/2ML IJ SOLN
INTRAMUSCULAR | Status: AC
  Filled 2016-04-01: qty 2

## 2016-04-01 MED ORDER — MIDAZOLAM HCL 2 MG/2ML IJ SOLN: 0.5000 mg | Freq: Once | INTRAMUSCULAR | Status: DC | PRN

## 2016-04-01 MED ORDER — CEFAZOLIN SODIUM-DEXTROSE 2-4 GM/100ML-% IV SOLN
INTRAVENOUS | Status: AC
Start: 2016-04-01 — End: 2016-04-01
  Filled 2016-04-01: qty 100

## 2016-04-01 MED ORDER — CHLORHEXIDINE GLUCONATE 4 % EX LIQD: 60.0000 mL | Freq: Once | CUTANEOUS | Status: DC

## 2016-04-01 MED ORDER — MIDAZOLAM HCL 2 MG/2ML IJ SOLN
1.0000 mg | INTRAMUSCULAR | Status: DC | PRN
  Administered 2016-04-01: 2 mg via INTRAVENOUS

## 2016-04-01 MED ORDER — ONDANSETRON HCL 4 MG/2ML IJ SOLN
INTRAMUSCULAR | Status: DC | PRN
  Administered 2016-04-01: 4 mg via INTRAVENOUS

## 2016-04-01 MED ORDER — SCOPOLAMINE 1 MG/3DAYS TD PT72: 1.0000 | MEDICATED_PATCH | Freq: Once | TRANSDERMAL | Status: DC | PRN

## 2016-04-01 MED ORDER — PHENYLEPHRINE 40 MCG/ML (10ML) SYRINGE FOR IV PUSH (FOR BLOOD PRESSURE SUPPORT)
PREFILLED_SYRINGE | INTRAVENOUS | Status: AC
  Filled 2016-04-01: qty 10

## 2016-04-01 MED ORDER — LIDOCAINE HCL (PF) 0.5 % IJ SOLN
INTRAMUSCULAR | Status: DC | PRN
  Administered 2016-04-01: 35 mL via INTRAVENOUS

## 2016-04-01 MED ORDER — MEPERIDINE HCL 25 MG/ML IJ SOLN: 6.2500 mg | INTRAMUSCULAR | Status: DC | PRN

## 2016-04-01 MED ORDER — PROPOFOL 10 MG/ML IV BOLUS
INTRAVENOUS | Status: DC | PRN
  Administered 2016-04-01 (×3): 20 mg via INTRAVENOUS

## 2016-04-01 MED ORDER — ONDANSETRON HCL 4 MG/2ML IJ SOLN
INTRAMUSCULAR | Status: AC
  Filled 2016-04-01: qty 2

## 2016-04-01 MED ORDER — LIDOCAINE HCL (CARDIAC) 20 MG/ML IV SOLN
INTRAVENOUS | Status: DC | PRN
  Administered 2016-04-01: 20 mg via INTRAVENOUS

## 2016-04-01 MED ORDER — BUPIVACAINE HCL (PF) 0.25 % IJ SOLN
INTRAMUSCULAR | Status: DC | PRN
  Administered 2016-04-01: 8 mL

## 2016-04-01 MED ORDER — FENTANYL CITRATE (PF) 100 MCG/2ML IJ SOLN
INTRAMUSCULAR | Status: AC
  Filled 2016-04-01: qty 2

## 2016-04-01 MED ORDER — FENTANYL CITRATE (PF) 100 MCG/2ML IJ SOLN: 25.0000 ug | INTRAMUSCULAR | Status: DC | PRN

## 2016-04-01 MED ORDER — FENTANYL CITRATE (PF) 100 MCG/2ML IJ SOLN
50.0000 ug | INTRAMUSCULAR | Status: DC | PRN
  Administered 2016-04-01: 100 ug via INTRAVENOUS

## 2016-04-01 MED ORDER — PROMETHAZINE HCL 25 MG/ML IJ SOLN: 6.2500 mg | INTRAMUSCULAR | Status: DC | PRN

## 2016-04-01 MED ORDER — LACTATED RINGERS IV SOLN
INTRAVENOUS | Status: DC
  Administered 2016-04-01: 09:00:00 via INTRAVENOUS

## 2016-04-01 MED ORDER — CEFAZOLIN SODIUM-DEXTROSE 2-4 GM/100ML-% IV SOLN
2.0000 g | INTRAVENOUS | Status: AC
  Administered 2016-04-01: 2 g via INTRAVENOUS

## 2016-04-01 MED ORDER — EPHEDRINE 5 MG/ML INJ
INTRAVENOUS | Status: AC
  Filled 2016-04-01: qty 10

## 2016-04-01 MED ORDER — LIDOCAINE 2% (20 MG/ML) 5 ML SYRINGE
INTRAMUSCULAR | Status: AC
  Filled 2016-04-01: qty 5

## 2016-04-01 MED ORDER — HYDROCODONE-ACETAMINOPHEN 5-325 MG PO TABS: ORAL_TABLET | ORAL | 0 refills | Status: DC

## 2016-04-01 SURGICAL SUPPLY — 39 items
BANDAGE ACE 3X5.8 VEL STRL LF (GAUZE/BANDAGES/DRESSINGS) ×2 IMPLANT
BLADE SURG 15 STRL LF DISP TIS (BLADE) ×2 IMPLANT
BLADE SURG 15 STRL SS (BLADE) ×4
BNDG CMPR 9X4 STRL LF SNTH (GAUZE/BANDAGES/DRESSINGS)
BNDG ESMARK 4X9 LF (GAUZE/BANDAGES/DRESSINGS) IMPLANT
BNDG GAUZE ELAST 4 BULKY (GAUZE/BANDAGES/DRESSINGS) ×2 IMPLANT
CHLORAPREP W/TINT 26ML (MISCELLANEOUS) ×2 IMPLANT
CORDS BIPOLAR (ELECTRODE) ×2 IMPLANT
COVER BACK TABLE 60X90IN (DRAPES) ×2 IMPLANT
COVER MAYO STAND STRL (DRAPES) ×2 IMPLANT
CUFF TOURNIQUET SINGLE 18IN (TOURNIQUET CUFF) ×2 IMPLANT
DRAPE EXTREMITY T 121X128X90 (DRAPE) ×2 IMPLANT
DRAPE SURG 17X23 STRL (DRAPES) ×2 IMPLANT
DRSG PAD ABDOMINAL 8X10 ST (GAUZE/BANDAGES/DRESSINGS) ×2 IMPLANT
GAUZE SPONGE 4X4 12PLY STRL (GAUZE/BANDAGES/DRESSINGS) ×2 IMPLANT
GAUZE XEROFORM 1X8 LF (GAUZE/BANDAGES/DRESSINGS) ×2 IMPLANT
GLOVE BIO SURGEON STRL SZ7.5 (GLOVE) ×2 IMPLANT
GLOVE BIOGEL PI IND STRL 7.5 (GLOVE) IMPLANT
GLOVE BIOGEL PI IND STRL 8 (GLOVE) ×1 IMPLANT
GLOVE BIOGEL PI INDICATOR 7.5 (GLOVE) ×2
GLOVE BIOGEL PI INDICATOR 8 (GLOVE) ×1
GLOVE SURG SYN 7.5  E (GLOVE) ×1
GLOVE SURG SYN 7.5 E (GLOVE) ×1 IMPLANT
GLOVE SURG SYN 7.5 PF PI (GLOVE) IMPLANT
GOWN STRL REUS W/ TWL LRG LVL3 (GOWN DISPOSABLE) ×1 IMPLANT
GOWN STRL REUS W/TWL LRG LVL3 (GOWN DISPOSABLE) ×2
GOWN STRL REUS W/TWL XL LVL3 (GOWN DISPOSABLE) ×2 IMPLANT
NDL HYPO 25X1 1.5 SAFETY (NEEDLE) ×1 IMPLANT
NEEDLE HYPO 25X1 1.5 SAFETY (NEEDLE) ×2 IMPLANT
NS IRRIG 1000ML POUR BTL (IV SOLUTION) ×2 IMPLANT
PACK BASIN DAY SURGERY FS (CUSTOM PROCEDURE TRAY) ×2 IMPLANT
PADDING CAST ABS 4INX4YD NS (CAST SUPPLIES) ×1
PADDING CAST ABS COTTON 4X4 ST (CAST SUPPLIES) ×1 IMPLANT
STOCKINETTE 4X48 STRL (DRAPES) ×2 IMPLANT
SUT ETHILON 4 0 PS 2 18 (SUTURE) ×2 IMPLANT
SYR BULB 3OZ (MISCELLANEOUS) ×2 IMPLANT
SYR CONTROL 10ML LL (SYRINGE) ×2 IMPLANT
TOWEL OR 17X24 6PK STRL BLUE (TOWEL DISPOSABLE) ×4 IMPLANT
UNDERPAD 30X30 (UNDERPADS AND DIAPERS) ×2 IMPLANT

## 2016-04-01 NOTE — Brief Op Note (Signed)
04/01/2016  11:07 AM  PATIENT:  Scott [name]  45 y.o. male  PRE-OPERATIVE DIAGNOSIS:  LEFT CARPAL TUNNEL SYNDROME G56.02  POST-OPERATIVE DIAGNOSIS:  LEFT CARPAL TUNNEL SYNDROME G56.02  PROCEDURE:  Procedure(s): LEFT CARPAL TUNNEL RELEASE (Left)  SURGEON:  Surgeon(s) and Role:    * Betha LoaKevin Waynette Towers, MD - Primary  PHYSICIAN ASSISTANT:   ASSISTANTS: none   ANESTHESIA:   Bier block with sedation  EBL:  Total I/O In: 1200 [I.V.:1200] Out: 10 [Blood:10]  BLOOD ADMINISTERED:none  DRAINS: none   LOCAL MEDICATIONS USED:  MARCAINE     SPECIMEN:  No Specimen  DISPOSITION OF SPECIMEN:  N/A  COUNTS:  YES  TOURNIQUET:   Total Tourniquet Time Documented: Forearm (Left) - 30 minutes Total: Forearm (Left) - 30 minutes   DICTATION: .Note written in EPIC  PLAN OF CARE: Discharge to home after PACU  PATIENT DISPOSITION:  PACU - hemodynamically stable.

## 2016-04-01 NOTE — Op Note (Signed)
04/01/2016 [facility]                              OPERATIVE REPORT   PREOPERATIVE DIAGNOSIS:  Left carpal tunnel syndrome.  POSTOPERATIVE DIAGNOSIS:  Left carpal tunnel syndrome.  PROCEDURE:  Left carpal tunnel release.  SURGEON:  Betha LoaKevin Kayelyn Lemon, MD  ASSISTANT:  none.  ANESTHESIA:  Bier block and sedation.  IV FLUIDS:  Per anesthesia flow sheet.  ESTIMATED BLOOD LOSS:  Minimal.  COMPLICATIONS:  None.  SPECIMENS:  None.  TOURNIQUET TIME:    Total Tourniquet Time Documented: Forearm (Left) - 30 minutes Total: Forearm (Left) - 30 minutes   DISPOSITION:  Stable to PACU.  LOCATION: [facility]  INDICATIONS:  45 yo male with left carpal tunnel syndrome.  Positive nerve conduction studies.  He wishes to have a carpal tunnel release for management of his symptoms.  Risks, benefits and alternatives of surgery were discussed including the risk of blood loss; infection; damage to nerves, vessels, tendons, ligaments, bone; failure of surgery; need for additional surgery; complications with wound healing; continued pain; recurrence of carpal tunnel syndrome; and damage to motor branch. He voiced understanding of these risks and elected to proceed.   OPERATIVE COURSE:  After being identified preoperatively by myself, the patient and I agreed upon the procedure and site of procedure.  The surgical site was marked.  The risks, benefits, and alternatives of the surgery were reviewed and he wished to proceed.  Surgical consent had been signed.  He was given IV Ancef as preoperative antibiotic prophylaxis.  He was transferred to the operating room and placed on the operating room table in supine position with the Left upper extremity on an armboard.  Bier block and sedation was induced by Anesthesiology.  Left upper extremity was prepped and draped in normal sterile orthopaedic fashion.  A surgical pause was performed between the surgeons, anesthesia, and operating room  staff, and all were in agreement as to the patient, procedure, and site of procedure.  Tourniquet at the proximal aspect of the forearm had been inflated for the Bier block.  Incision was made over the transverse carpal ligament and carried into the subcutaneous tissues by spreading technique.  Bipolar electrocautery was used to obtain hemostasis.  The palmar fascia was sharply incised.  The transverse carpal ligament was identified and sharply incised.  It was incised distally first.  Care was taken to ensure complete decompression distally.  It was then incised proximally.  Scissors were used to split the distal aspect of the volar antebrachial fascia.  A finger was placed into the wound to ensure complete decompression, which was the case.  The nerve was examined.  It was flattened and hyperemic.  The motor branch was identified and was intact.  The wound was copiously irrigated with sterile saline.  It was then closed with 4-0 nylon in a horizontal mattress fashion.  It was injected with 8 mL of 0.25% plain Marcaine to aid in postoperative analgesia.  It was dressed with sterile Xeroform, 4x4s, an ABD, and wrapped with Kerlix and an Ace bandage.  Tourniquet was deflated at 30 minutes.  Fingertips were pink with brisk capillary refill after deflation of the tourniquet.  Operative drapes were broken down.  The patient was awoken from anesthesia safely.  He was transferred back to stretcher and taken to the PACU in stable condition.  I will see him back in the  office in 1 week for postoperative followup.  I will give him a prescription for norco 5/325 1-2 tabs PO q6 hours prn pain, dispense #20.    Tami RibasKUZMA,Joni Colegrove R, MD Electronically signed, 04/01/16

## 2016-04-01 NOTE — Discharge Instructions (Addendum)

## 2016-04-01 NOTE — Anesthesia Postprocedure Evaluation (Signed)
Anesthesia Post Note  [name]  Procedure(s) Performed: Procedure(s) (LRB): LEFT CARPAL TUNNEL RELEASE (Left)  Patient location during evaluation: PACU Anesthesia Type: Bier Block and MAC Level of consciousness: oriented, patient cooperative and awake and alert Pain management: pain level controlled Vital Signs Assessment: post-procedure vital signs reviewed and stable Respiratory status: spontaneous breathing, nonlabored ventilation and respiratory function stable Cardiovascular status: blood pressure returned to baseline and stable Postop Assessment: no signs of nausea or vomiting Anesthetic complications: no    Last Vitals:  Vitals:   04/01/16 1139 04/01/16 1208  BP:  (!) 178/99  Pulse: 84 70  Resp: (!) 25 18  Temp:  36.7 C    Last Pain:  Vitals:   04/01/16 1130  TempSrc:   PainSc: 0-No pain                 Ninnie Fein,E. Sylvan Sookdeo

## 2016-04-01 NOTE — Anesthesia Preprocedure Evaluation (Addendum)
Anesthesia Evaluation  Patient identified by MRN, date of birth, ID band Patient awake    Reviewed: Allergy & Precautions, NPO status , Patient's Chart, lab work & pertinent test results  History of Anesthesia Complications Negative for: history of anesthetic complications  Airway Mallampati: II  TM Distance: >3 FB Neck ROM: Full    Dental  (+) Dental Advisory Given   Pulmonary sleep apnea (does not use CPAP) ,    breath sounds clear to auscultation       Cardiovascular hypertension, Pt. on medications (-) angina+ DVT   Rhythm:Regular Rate:Normal  '13 ECHO: EF 50-55%, valves OK   Neuro/Psych    GI/Hepatic negative GI ROS, Neg liver ROS,   Endo/Other  diabetes (glu 108), Oral Hypoglycemic AgentsMorbid obesity  Renal/GU negative Renal ROS     Musculoskeletal   Abdominal (+) + obese,   Peds  Hematology   Anesthesia Other Findings   Reproductive/Obstetrics                            Anesthesia Physical Anesthesia Plan  ASA: III  Anesthesia Plan: Bier Block   Post-op Pain Management:    Induction:   Airway Management Planned: Nasal Cannula and Natural Airway  Additional Equipment:   Intra-op Plan:   Post-operative Plan:   Informed Consent: I have reviewed the patients History and Physical, chart, labs and discussed the procedure including the risks, benefits and alternatives for the proposed anesthesia with the patient or authorized representative who has indicated his/her understanding and acceptance.   Dental advisory given  Plan Discussed with: CRNA and Surgeon  Anesthesia Plan Comments: (Plan routine monitors, IV regional)        Anesthesia Quick Evaluation

## 2016-04-01 NOTE — H&P (Signed)
  Scott [name] is an 45 y.o. male.   Chief Complaint: left carpal tunnel syndrome HPI: 45 yo male with left carpal tunnel syndrome.  Positive nerve conduction studies.  It is bothersome to him.  He wishes to have a surgical release.  Allergies: No Known Allergies  Past Medical History:  Diagnosis Date  . Carpal tunnel syndrome   . Diabetes mellitus   . DVT (deep venous thrombosis) (HCC)   . Hypertension   . Obesity   . Shoulder pain, bilateral   . Sleep apnea    was fitted for mask and never got machine - too costly    Past Surgical History:  Procedure Laterality Date  . CARPAL TUNNEL RELEASE Right 12/04/2015   Procedure: RIGHT CARPAL TUNNEL RELEASE;  Surgeon: Betha LoaKevin Linford Quintela, MD;  Location: [facility];  Service: Orthopedics;  Laterality: Right;  RIGHT CARPAL TUNNEL RELEASE  . CARPAL TUNNEL RELEASE Right 12/2015   [facility]  . right hand surgery    . ROTATOR CUFF REPAIR      Family History: Family History  Problem Relation Age of Onset  . Hypertension Mother   . Kidney disease Father   . Hypertension Other   . Hyperlipidemia Other   . Diabetes Other     Social History:   reports that he has never smoked. He has never used smokeless tobacco. He reports that he does not drink alcohol or use drugs.  Medications: Medications Prior to Admission  Medication Sig Dispense Refill  . aspirin EC 81 MG tablet Take 81 mg by mouth daily.    [name]. atorvastatin (LIPITOR) 20 MG tablet TAKE ONE TABLET BY MOUTH DAILY 30 tablet 0  . fluticasone (FLONASE) 50 MCG/ACT nasal spray USE 2 SPRAYS IN EACH NOSTRIL ONCE DAILY 16 g 0  . gabapentin (NEURONTIN) 300 MG capsule Take 1 capsule (300 mg total) by mouth 3 (three) times daily. 90 capsule 1  . ketotifen (ZADITOR) 0.025 % ophthalmic solution Place 1 drop into both eyes 2 (two) times daily. 5 mL 0  . Linagliptin-Metformin HCl (JENTADUETO) 2.08-998 MG TABS Take 1 tablet by mouth daily. Needs office visit for refills 30 tablet 0  .  lisinopril (PRINIVIL,ZESTRIL) 10 MG tablet Take 1 tablet (10 mg total) by mouth daily. 60 tablet 4  . loratadine (CLARITIN) 10 MG tablet Take 1 tablet (10 mg total) by mouth daily. 30 tablet 1    No results found for this or any previous visit (from the past 48 hour(s)).  No results found.   A comprehensive review of systems was negative.  Blood pressure (!) 158/106, pulse 78, temperature 98.2 F (36.8 C), temperature source Oral, resp. rate 18, height 5\' 6"  (1.676 m), weight 106.1 kg (234 lb), SpO2 100 %.  General appearance: alert, cooperative and appears stated age Head: Normocephalic, without obvious abnormality, atraumatic Neck: supple, symmetrical, trachea midline Resp: clear to auscultation bilaterally Cardio: regular rate and rhythm GI: non-tender Extremities: Intact sensation and capillary refill all digits.  +epl/fpl/io.  No wounds.  Pulses: 2+ and symmetric Skin: Skin color, texture, turgor normal. No rashes or lesions Neurologic: Grossly normal Incision/Wound:none  Assessment/Plan Left carpal tunnel syndrome.  Non operative and operative treatment options were discussed with the patient and patient wishes to proceed with operative treatment. Risks, benefits, and alternatives of surgery were discussed and the patient agrees with the plan of care.   Shalona Harbour R 04/01/2016, 8:46 AM

## 2016-04-01 NOTE — Transfer of Care (Signed)
Immediate Anesthesia Transfer of Care Note  Patient: Scott DerrickKenneth W Dubree  Procedure(s) Performed: Procedure(s): LEFT CARPAL TUNNEL RELEASE (Left)  Patient Location: PACU  Anesthesia Type:MAC and Bier block  Level of Consciousness: awake, alert  and oriented  Airway & Oxygen Therapy: Patient Spontanous Breathing and Patient connected to face mask oxygen  Post-op Assessment: Report given to RN and Post -op Vital signs reviewed and stable  Post vital signs: Reviewed and stable  Last Vitals:  Vitals:   04/01/16 0836  BP: (!) 158/106  Pulse: 78  Resp: 18  Temp: 36.8 C    Last Pain:  Vitals:   04/01/16 0836  TempSrc: Oral      Patients Stated Pain Goal: 0 (04/01/16 0836)  Complications: No apparent anesthesia complications

## 2016-04-02 ENCOUNTER — Encounter (HOSPITAL_BASED_OUTPATIENT_CLINIC_OR_DEPARTMENT_OTHER): Payer: Self-pay | Admitting: Orthopedic Surgery

## 2016-04-05 NOTE — Addendum Note (Signed)
Addendum  created 04/05/16 [id number] by Jewel Baizeimothy D Vania Rosero, CRNA   Charge Capture section accepted

## 2016-04-11 ENCOUNTER — Other Ambulatory Visit: Payer: Self-pay | Admitting: Internal Medicine

## 2016-04-11 DIAGNOSIS — E785 Hyperlipidemia, unspecified: Secondary | ICD-10-CM

## 2016-05-07 ENCOUNTER — Encounter: Payer: Self-pay | Admitting: Family Medicine

## 2016-05-07 ENCOUNTER — Ambulatory Visit (INDEPENDENT_AMBULATORY_CARE_PROVIDER_SITE_OTHER): Payer: 59 | Admitting: Family Medicine

## 2016-05-07 VITALS — BP 139/91 | HR 80 | Ht 66.0 in | Wt 238.0 lb

## 2016-05-07 DIAGNOSIS — M7701 Medial epicondylitis, right elbow: Secondary | ICD-10-CM | POA: Diagnosis not present

## 2016-05-07 MED ORDER — METHYLPREDNISOLONE ACETATE 40 MG/ML IJ SUSP
40.0000 mg | Freq: Once | INTRAMUSCULAR | Status: AC
  Administered 2016-05-07: 40 mg via INTRA_ARTICULAR

## 2016-05-07 NOTE — Patient Instructions (Signed)
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 Tylenol or aleve as needed for pain. Wait a week then can start the strengthening with 1 pound weight pronation/supination, wrist flexion, stretching exercises (hold stretches 20-30 seconds, exercises 15 of each - do 3 sets of stretches/exercises). Sleeve for compression. Follow up with me in 6 weeks.

## 2016-05-10 NOTE — Assessment & Plan Note (Signed)
Improved completely after last visit but worsened recently.  Encouraged home exercises, tylenol or aleve if needed.  Sleeve for compression.  Consider physical therapy if not improving.  After informed written consent patient was seated in chair in exam room.  Area overlying painful area just distal to right medial epicondyle prepped with alcohol swab then injected with 2:1 bupivicaine: depomedrol with multiple needle fenestrations.  Patient tolerated procedure well without immediate complications.

## 2016-05-10 NOTE — Progress Notes (Signed)
PCP: Ambrose Finland, NP  Subjective:   HPI: Patient is a 46 y.o. male here for right elbow pain.  7/13: Patient returns with a 10 day history of medial right elbow pain. Started hurting the same day he was putting up a portable shed. Pain level 7/10 at rest, up to 10/10 with full extension and a lot of activity. No acute injury or trauma. Pain is sharp. No swelling, bruising. No skin changes, numbness.  8/14: Patient returns with continued medial right elbow pain. Pain severe 10/10 with movements of elbow. Worse when straightening out elbow. No new injury. No skin changes, numbness.  05/07/16: Patient reports his elbow completely improved after last visit, injection. THen over past month started to get pain medial right elbow again. Pain with lifting, sleeping. Pain 0/10 but up to 10/10 and sharp. No numbness, skin changes. Not tried anything for this the past month.  Past Medical History:  Diagnosis Date  . Carpal tunnel syndrome   . Diabetes mellitus   . DVT (deep venous thrombosis) (HCC)   . Hypertension   . Obesity   . Shoulder pain, bilateral   . Sleep apnea    was fitted for mask and never got machine - too costly    Current Outpatient Prescriptions on File Prior to Visit  Medication Sig Dispense Refill  . aspirin EC 81 MG tablet Take 81 mg by mouth daily.    [name] atorvastatin (LIPITOR) 20 MG tablet TAKE ONE TABLET BY MOUTH DAILY **MUST CALL MD FOR APPOINTMENT 30 tablet 0  . fluticasone (FLONASE) 50 MCG/ACT nasal spray USE 2 SPRAYS IN EACH NOSTRIL ONCE DAILY 16 g 0  . gabapentin (NEURONTIN) 300 MG capsule Take 1 capsule (300 mg total) by mouth 3 (three) times daily. 90 capsule 1  . HYDROcodone-acetaminophen (NORCO) 5-325 MG tablet 1-2 tabs po q6 hours prn pain 20 tablet 0  . ketotifen (ZADITOR) 0.025 % ophthalmic solution Place 1 drop into both eyes 2 (two) times daily. 5 mL 0  . Linagliptin-Metformin HCl (JENTADUETO) 2.08-998 MG TABS Take 1 tablet by mouth daily.  Needs office visit for refills 30 tablet 0  . lisinopril (PRINIVIL,ZESTRIL) 10 MG tablet Take 1 tablet (10 mg total) by mouth daily. 60 tablet 4  . loratadine (CLARITIN) 10 MG tablet Take 1 tablet (10 mg total) by mouth daily. 30 tablet 1   No current facility-administered medications on file prior to visit.     Past Surgical History:  Procedure Laterality Date  . CARPAL TUNNEL RELEASE Right 12/04/2015   Procedure: RIGHT CARPAL TUNNEL RELEASE;  Surgeon: Betha Loa, MD;  Location: [facility];  Service: Orthopedics;  Laterality: Right;  RIGHT CARPAL TUNNEL RELEASE  . CARPAL TUNNEL RELEASE Right 12/2015   [facility]  . CARPAL TUNNEL RELEASE Left 04/01/2016   Procedure: LEFT CARPAL TUNNEL RELEASE;  Surgeon: Betha Loa, MD;  Location: [facility];  Service: Orthopedics;  Laterality: Left;  . right hand surgery    . ROTATOR CUFF REPAIR      No Known Allergies  Social History   Social History  . Marital status: Married    Spouse name: N/A  . Number of children: N/A  . Years of education: 12+   Occupational History  . real estate agent    Social History Main Topics  . Smoking status: Never Smoker  . Smokeless tobacco: Never Used  . Alcohol use No  . Drug use: No  . Sexual activity: Not on file  Other Topics Concern  . Not on file   Social History Narrative   Lives at home.   Right-handed.   Occasional caffeine use.    Family History  Problem Relation Age of Onset  . Hypertension Mother   . Kidney disease Father   . Hypertension Other   . Hyperlipidemia Other   . Diabetes Other     BP (!) 139/91   Pulse 80   Ht 5\' 6"  (1.676 m)   Wt 238 lb (108 kg)   BMI 38.41 kg/m   Review of Systems: See HPI above.    Objective:  Physical Exam:  Gen: NAD, comfortable in exam room  Right elbow: No gross deformity, swelling, bruising. TTP medial epicondyle. FROM elbow and wrist with pain on pronation. Strength 5/5 all motions of elbow and  wrist. Collateral ligaments intact. Negative tinels cubital tunnel. NVI distally.  Left elbow: FROM without pain.    Assessment & Plan:  1. Right medial epicondylitis - Improved completely after last visit but worsened recently.  Encouraged home exercises, tylenol or aleve if needed.  Sleeve for compression.  Consider physical therapy if not improving.  After informed written consent patient was seated in chair in exam room.  Area overlying painful area just distal to right medial epicondyle prepped with alcohol swab then injected with 2:1 bupivicaine: depomedrol with multiple needle fenestrations.  Patient tolerated procedure well without immediate complications.

## 2016-05-13 ENCOUNTER — Other Ambulatory Visit: Payer: Self-pay | Admitting: Internal Medicine

## 2016-05-13 DIAGNOSIS — E785 Hyperlipidemia, unspecified: Secondary | ICD-10-CM

## 2016-05-19 ENCOUNTER — Telehealth: Payer: Self-pay | Admitting: Internal Medicine

## 2016-05-19 DIAGNOSIS — E785 Hyperlipidemia, unspecified: Secondary | ICD-10-CM

## 2016-05-19 MED ORDER — ATORVASTATIN CALCIUM 20 MG PO TABS: ORAL_TABLET | ORAL | 0 refills | Status: DC

## 2016-05-19 NOTE — Telephone Encounter (Signed)
Patient 's appt. Rescheduled for another week. patient is out of medication and would like to know if at all possible her could get a refill until his appt. On th 26th  Atorvastatin

## 2016-05-19 NOTE — Telephone Encounter (Signed)
Atorvastatin refilled.  

## 2016-05-20 ENCOUNTER — Ambulatory Visit: Payer: 59 | Admitting: Family Medicine

## 2016-05-26 ENCOUNTER — Ambulatory Visit (INDEPENDENT_AMBULATORY_CARE_PROVIDER_SITE_OTHER): Payer: 59 | Admitting: Family Medicine

## 2016-05-26 ENCOUNTER — Ambulatory Visit: Payer: 59 | Admitting: Family Medicine

## 2016-05-26 ENCOUNTER — Encounter: Payer: Self-pay | Admitting: Family Medicine

## 2016-05-26 VITALS — BP 137/92 | HR 73 | Ht 66.0 in | Wt 235.0 lb

## 2016-05-26 DIAGNOSIS — M722 Plantar fascial fibromatosis: Secondary | ICD-10-CM

## 2016-05-26 MED ORDER — METHYLPREDNISOLONE ACETATE 40 MG/ML IJ SUSP
40.0000 mg | Freq: Once | INTRAMUSCULAR | Status: AC
  Administered 2016-05-26: 40 mg via INTRA_ARTICULAR

## 2016-05-26 NOTE — Patient Instructions (Signed)
You have plantar fasciitis Take tylenol or aleve as needed for pain  Plantar fascia stretch for 20-30 seconds (do 3 of these) in morning Lowering/raise on a step exercises 3 x 10 once or twice a day - this is very important for long term recovery. Can add heel walks, toe walks forward and backward as well Ice heel for 15 minutes as needed. Avoid flat shoes/barefoot walking as much as possible. Arch straps have been shown to help with pain. Heel lifts may help with pain by avoiding fully stretching the plantar fascia except when doing home exercises. Orthotics are helpful (OTC dr scholls active series, spencos or our custom ones). Steroid injection is a consideration for short term pain relief if you are struggling - you were given this today. Physical therapy is also an option. Follow up with me in 1 month.

## 2016-05-27 DIAGNOSIS — M722 Plantar fascial fibromatosis: Secondary | ICD-10-CM | POA: Insufficient documentation

## 2016-05-27 NOTE — Assessment & Plan Note (Signed)
reviewed home exercises and stretches to do daily.  Has arch binders.  Encouraged arch supports.  Injection given today.  Discussed icing, nsaids, massage, night splints, physical therapy.  F/u in 1 month.  After informed written consent patient was seated on exam table.  Area overlying left medial plantar fascia prepped with alcohol swab then using ultrasound guidance with multiple needle fenestrations patient's left plantar fascia was injected with 2:1 bupivicaine: depomedrol.  Patient tolerated procedure well without immediate complications.

## 2016-05-27 NOTE — Progress Notes (Signed)
PCP: Ambrose Finland, NP  Subjective:   HPI: Patient is a 46 y.o. male here for left heel pain.  Patient reports for just over a month he's had plantar left heel pain. Pain 7/10 at rest, up to 10/10 with pressure on foot. Worse when going from sitting to standing, sharp. Doing some stretching and using arch binder without much benefit. Not taking any medicine for this. No skin changes, numbness.  Past Medical History:  Diagnosis Date  . Carpal tunnel syndrome   . Diabetes mellitus   . DVT (deep venous thrombosis) (HCC)   . Hypertension   . Obesity   . Shoulder pain, bilateral   . Sleep apnea    was fitted for mask and never got machine - too costly    Current Outpatient Prescriptions on File Prior to Visit  Medication Sig Dispense Refill  . aspirin EC 81 MG tablet Take 81 mg by mouth daily.    [name] atorvastatin (LIPITOR) 20 MG tablet TAKE ONE TABLET BY MOUTH DAILY 30 tablet 0  . canagliflozin (INVOKANA) 300 MG TABS tablet     . fluticasone (FLONASE) 50 MCG/ACT nasal spray USE 2 SPRAYS IN EACH NOSTRIL ONCE DAILY 16 g 0  . gabapentin (NEURONTIN) 300 MG capsule Take 1 capsule (300 mg total) by mouth 3 (three) times daily. 90 capsule 1  . HYDROcodone-acetaminophen (NORCO) 5-325 MG tablet 1-2 tabs po q6 hours prn pain 20 tablet 0  . ketotifen (ZADITOR) 0.025 % ophthalmic solution Place 1 drop into both eyes 2 (two) times daily. 5 mL 0  . Linagliptin-Metformin HCl (JENTADUETO) 2.08-998 MG TABS Take 1 tablet by mouth daily. Needs office visit for refills 30 tablet 0  . lisinopril (PRINIVIL,ZESTRIL) 10 MG tablet Take 1 tablet (10 mg total) by mouth daily. 60 tablet 4  . loratadine (CLARITIN) 10 MG tablet Take 1 tablet (10 mg total) by mouth daily. 30 tablet 1   No current facility-administered medications on file prior to visit.     Past Surgical History:  Procedure Laterality Date  . CARPAL TUNNEL RELEASE Right 12/04/2015   Procedure: RIGHT CARPAL TUNNEL RELEASE;  Surgeon: Betha Loa, MD;  Location: [facility];  Service: Orthopedics;  Laterality: Right;  RIGHT CARPAL TUNNEL RELEASE  . CARPAL TUNNEL RELEASE Right 12/2015   [facility]  . CARPAL TUNNEL RELEASE Left 04/01/2016   Procedure: LEFT CARPAL TUNNEL RELEASE;  Surgeon: Betha Loa, MD;  Location: [facility];  Service: Orthopedics;  Laterality: Left;  . right hand surgery    . ROTATOR CUFF REPAIR      No Known Allergies  Social History   Social History  . Marital status: Married    Spouse name: N/A  . Number of children: N/A  . Years of education: 12+   Occupational History  . real estate agent    Social History Main Topics  . Smoking status: Never Smoker  . Smokeless tobacco: Never Used  . Alcohol use No  . Drug use: No  . Sexual activity: Not on file   Other Topics Concern  . Not on file   Social History Narrative   Lives at home.   Right-handed.   Occasional caffeine use.    Family History  Problem Relation Age of Onset  . Hypertension Mother   . Kidney disease Father   . Hypertension Other   . Hyperlipidemia Other   . Diabetes Other     BP (!) 137/92   Pulse 73  Ht 5\' 6"  (1.676 m)   Wt 235 lb (106.6 kg)   BMI 37.93 kg/m   Review of Systems: See HPI above.     Objective:  Physical Exam:  Gen: NAD, comfortable in exam room  Left foot/ankle: Mod pronation.  No gross deformity, swelling, ecchymoses FROM TTP plantar fascia at medial insertion on calcaneus.  No other tenderness. Negative ant drawer and talar tilt.   Negative syndesmotic compression. Negative calcaneal squeeze. Thompsons test negative. NV intact distally.  Right foot/ankle: FROM without pain.   Assessment & Plan:  1. Left plantar fasciitis - reviewed home exercises and stretches to do daily.  Has arch binders.  Encouraged arch supports.  Injection given today.  Discussed icing, nsaids, massage, night splints, physical therapy.  F/u in 1 month.  After informed written  consent patient was seated on exam table.  Area overlying left medial plantar fascia prepped with alcohol swab then using ultrasound guidance with multiple needle fenestrations patient's left plantar fascia was injected with 2:1 bupivicaine: depomedrol.  Patient tolerated procedure well without immediate complications.

## 2016-05-28 ENCOUNTER — Ambulatory Visit: Payer: 59 | Attending: Family Medicine | Admitting: Family Medicine

## 2016-05-28 ENCOUNTER — Encounter: Payer: Self-pay | Admitting: Family Medicine

## 2016-05-28 VITALS — BP 141/89 | HR 87 | Temp 98.6°F | Resp 18 | Ht 66.0 in | Wt 235.0 lb

## 2016-05-28 DIAGNOSIS — I10 Essential (primary) hypertension: Secondary | ICD-10-CM

## 2016-05-28 DIAGNOSIS — E1141 Type 2 diabetes mellitus with diabetic mononeuropathy: Secondary | ICD-10-CM | POA: Insufficient documentation

## 2016-05-28 DIAGNOSIS — Z7984 Long term (current) use of oral hypoglycemic drugs: Secondary | ICD-10-CM | POA: Insufficient documentation

## 2016-05-28 DIAGNOSIS — Z79899 Other long term (current) drug therapy: Secondary | ICD-10-CM | POA: Insufficient documentation

## 2016-05-28 DIAGNOSIS — Z7951 Long term (current) use of inhaled steroids: Secondary | ICD-10-CM | POA: Insufficient documentation

## 2016-05-28 DIAGNOSIS — H6123 Impacted cerumen, bilateral: Secondary | ICD-10-CM | POA: Diagnosis not present

## 2016-05-28 DIAGNOSIS — Z7982 Long term (current) use of aspirin: Secondary | ICD-10-CM | POA: Insufficient documentation

## 2016-05-28 LAB — GLUCOSE, POCT (MANUAL RESULT ENTRY): POC Glucose: 116 mg/dl — AB (ref 70–99)

## 2016-05-28 LAB — POCT GLYCOSYLATED HEMOGLOBIN (HGB A1C): HEMOGLOBIN A1C: 6.7

## 2016-05-28 MED ORDER — CARBAMIDE PEROXIDE 6.5 % OT SOLN: 10.0000 [drp] | Freq: Two times a day (BID) | OTIC | 0 refills | Status: DC

## 2016-05-28 MED ORDER — LINAGLIPTIN-METFORMIN HCL 2.5-1000 MG PO TABS: 1.0000 | ORAL_TABLET | Freq: Every day | ORAL | 2 refills | Status: DC

## 2016-05-28 MED ORDER — LISINOPRIL 20 MG PO TABS: 20.0000 mg | ORAL_TABLET | Freq: Every day | ORAL | 2 refills | Status: DC

## 2016-05-28 MED ORDER — LISINOPRIL 10 MG PO TABS: 10.0000 mg | ORAL_TABLET | Freq: Every day | ORAL | 2 refills | Status: DC

## 2016-05-28 MED ORDER — LISINOPRIL 10 MG PO TABS: 20.0000 mg | ORAL_TABLET | Freq: Every day | ORAL | 2 refills | Status: DC

## 2016-05-28 MED ORDER — AMLODIPINE BESYLATE 5 MG PO TABS: 5.0000 mg | ORAL_TABLET | Freq: Every day | ORAL | 2 refills | Status: DC

## 2016-05-28 NOTE — Progress Notes (Signed)
Subjective:  Patient ID: Scott [name], male    DOB: 11/05/1970  Age: 46 y.o. MRN: [id number]  CC: Establish Care   HPI Scott [name] presents for DM 2. He denies any blurry vision, polydipsia, polyuria, or wounds. Denies any tingling or numbness of the extremities since his carpal tunnel operations in August and November. Reports CBG well controlled at home ranging between 120-180's. Expresses serious concern about Invokana and the risk of amputations. He also c/o getting water in his ear 3 weeks ago. Reports hearing is muffled in the left ear. Denies any tinnitus or ear pain. Reports trying OTC "swimmer's ear" drops with moderate improvement of symptoms.   Outpatient Medications Prior to Visit  Medication Sig Dispense Refill  . aspirin EC 81 MG tablet Take 81 mg by mouth daily.    [name]. atorvastatin (LIPITOR) 20 MG tablet TAKE ONE TABLET BY MOUTH DAILY 30 tablet 0  . canagliflozin (INVOKANA) 300 MG TABS tablet     . fluticasone (FLONASE) 50 MCG/ACT nasal spray USE 2 SPRAYS IN EACH NOSTRIL ONCE DAILY 16 g 0  . gabapentin (NEURONTIN) 300 MG capsule Take 1 capsule (300 mg total) by mouth 3 (three) times daily. 90 capsule 1  . HYDROcodone-acetaminophen (NORCO) 5-325 MG tablet 1-2 tabs po q6 hours prn pain 20 tablet 0  . ketotifen (ZADITOR) 0.025 % ophthalmic solution Place 1 drop into both eyes 2 (two) times daily. 5 mL 0  . Linagliptin-Metformin HCl (JENTADUETO) 2.08-998 MG TABS Take 1 tablet by mouth daily. Needs office visit for refills 30 tablet 0  . lisinopril (PRINIVIL,ZESTRIL) 10 MG tablet Take 1 tablet (10 mg total) by mouth daily. 60 tablet 4  . loratadine (CLARITIN) 10 MG tablet Take 1 tablet (10 mg total) by mouth daily. 30 tablet 1   No facility-administered medications prior to visit.     ROS Review of Systems  HENT: Hearing loss: muffled hearing in left ear.   Eyes: Negative.   Respiratory: Negative.   Cardiovascular: Negative.   Gastrointestinal: Negative.     Endocrine: Negative.   Skin: Negative.    Objective:  There were no vitals taken for this visit.  BP/Weight 05/26/2016 05/07/2016 04/01/2016  Systolic BP 137 139 178  Diastolic BP 92 91 99  Wt. (Lbs) 235 238 234  BMI 37.93 38.41 37.77   Physical Exam  HENT:  Right Ear: External ear normal.  Left Ear: External ear normal.  Nose: Nose normal.  Mouth/Throat: Oropharynx is clear and moist.  large amount of soft cerumen bilateral ear canals.  Eyes: Pupils are equal, round, and reactive to light.  Cardiovascular: Normal rate, regular rhythm, normal heart sounds and intact distal pulses.   Pulmonary/Chest: Effort normal and breath sounds normal.  Abdominal: Soft. Bowel sounds are normal.  Skin: Skin is warm and dry.  Monofilament exam is wnl.  Nursing note and vitals reviewed.  Assessment & Plan:   Problem List Items Addressed This Visit      Cardiovascular and Mediastinum   HTN (hypertension), benign   Relevant Medications   amLODipine (NORVASC) 5 MG tablet   lisinopril (PRINIVIL,ZESTRIL) 10 MG tablet     Endocrine   DM type 2 (diabetes mellitus, type 2) (HCC) - Primary (Chronic)   -Patient appears very concerned about continuing Invokana use        suggested discontinuing Invokana based on his concern and starting Jentadueto.   -Bring log of CBG's to appointment with clinical pharmacist in 2 weeks for DM  Relevant Medications   Linagliptin-Metformin HCl (JENTADUETO) 2.08-998 MG TABS   lisinopril (PRINIVIL,ZESTRIL) 10 MG tablet   Other Relevant Orders   Microalbumin/Creatinine Ratio, Urine (Completed)   BASIC METABOLIC PANEL WITH GFR (Completed)   Lipid Panel (Completed)   HgB A1c (Completed)   Glucose (CBG) (Completed)    Other Visit Diagnoses    Excessive cerumen in both ear canals       Relevant Medications   carbamide peroxide (DEBROX) 6.5 % otic solution     Meds ordered this encounter  Medications  . carbamide peroxide (DEBROX) 6.5 % otic solution    Sig:  Place 10 drops into both ears 2 (two) times daily.    Dispense:  15 mL    Refill:  0    Order Specific Question:   Supervising Provider    Answer:   Quentin Angst [id number]  . amLODipine (NORVASC) 5 MG tablet    Sig: Take 1 tablet (5 mg total) by mouth daily.    Dispense:  30 tablet    Refill:  2    Order Specific Question:   Supervising Provider    Answer:   Quentin Angst [id number]  . DISCONTD: lisinopril (PRINIVIL,ZESTRIL) 20 MG tablet    Sig: Take 1 tablet (20 mg total) by mouth daily.    Dispense:  30 tablet    Refill:  2    Order Specific Question:   Supervising Provider    Answer:   Quentin Angst [id number]  . Linagliptin-Metformin HCl (JENTADUETO) 2.08-998 MG TABS    Sig: Take 1 tablet by mouth daily.    Dispense:  30 tablet    Refill:  2    Order Specific Question:   Supervising Provider    Answer:   Quentin Angst [id number]  . DISCONTD: lisinopril (PRINIVIL,ZESTRIL) 10 MG tablet    Sig: Take 2 tablets (20 mg total) by mouth daily.    Dispense:  30 tablet    Refill:  2    Order Specific Question:   Supervising Provider    Answer:   Quentin Angst [id number]  . lisinopril (PRINIVIL,ZESTRIL) 10 MG tablet    Sig: Take 1 tablet (10 mg total) by mouth daily.    Dispense:  3 tablet    Refill:  2    Order Specific Question:   Supervising Provider    Answer:   Quentin Angst [[id number]]    Follow-up: Return in about 2 weeks (around 06/11/2016) for Appt. with clinical pharmacist Stacy for BP and DM check. Lizbeth Bark FNP

## 2016-05-28 NOTE — Patient Instructions (Addendum)
Come back in 2 week for BP and DM check with clinical pharmacist.   Amlodipine tablets What is this medicine? AMLODIPINE (am LOE di peen) is a calcium-channel blocker. It affects the amount of calcium found in your heart and muscle cells. This relaxes your blood vessels, which can reduce the amount of work the heart has to do. This medicine is used to lower high blood pressure. It is also used to prevent chest pain. This medicine may be used for other purposes; ask your health care provider or pharmacist if you have questions. COMMON BRAND NAME(S): Norvasc What should I tell my health care provider before I take this medicine? They need to know if you have any of these conditions: -heart problems like heart failure or aortic stenosis -liver disease -an unusual or allergic reaction to amlodipine, other medicines, foods, dyes, or preservatives -pregnant or trying to get pregnant -breast-feeding How should I use this medicine? Take this medicine by mouth with a glass of water. Follow the directions on the prescription label. Take your medicine at regular intervals. Do not take more medicine than directed. Talk to your pediatrician regarding the use of this medicine in children. Special care may be needed. This medicine has been used in children as young as 6. Persons over 14 years old may have a stronger reaction to this medicine and need smaller doses. Overdosage: If you think you have taken too much of this medicine contact a poison control center or emergency room at once. NOTE: This medicine is only for you. Do not share this medicine with others. What if I miss a dose? If you miss a dose, take it as soon as you can. If it is almost time for your next dose, take only that dose. Do not take double or extra doses. What may interact with this medicine? -herbal or dietary supplements -local or general anesthetics -medicines for high blood pressure -medicines for prostate  problems -rifampin This list may not describe all possible interactions. Give your health care provider a list of all the medicines, herbs, non-prescription drugs, or dietary supplements you use. Also tell them if you smoke, drink alcohol, or use illegal drugs. Some items may interact with your medicine. What should I watch for while using this medicine? Visit your doctor or health care professional for regular check ups. Check your blood pressure and pulse rate regularly. Ask your health care professional what your blood pressure and pulse rate should be, and when you should contact him or her. This medicine may make you feel confused, dizzy or lightheaded. Do not drive, use machinery, or do anything that needs mental alertness until you know how this medicine affects you. To reduce the risk of dizzy or fainting spells, do not sit or stand up quickly, especially if you are an older patient. Avoid alcoholic drinks; they can make you more dizzy. Do not suddenly stop taking amlodipine. Ask your doctor or health care professional how you can gradually reduce the dose. What side effects may I notice from receiving this medicine? Side effects that you should report to your doctor or health care professional as soon as possible: -allergic reactions like skin rash, itching or hives, swelling of the face, lips, or tongue -breathing problems -changes in vision or hearing -chest pain -fast, irregular heartbeat -swelling of legs or ankles Side effects that usually do not require medical attention (report to your doctor or health care professional if they continue or are bothersome): -dry mouth -facial flushing -nausea, vomiting -  stomach gas, pain -tired, weak -trouble sleeping This list may not describe all possible side effects. Call your doctor for medical advice about side effects. You may report side effects to FDA at 1-800-FDA-1088. Where should I keep my medicine? Keep out of the reach of  children. Store at room temperature between 59 and 86 degrees F (15 and 30 degrees C). Protect from light. Keep container tightly closed. Throw away any unused medicine after the expiration date. NOTE: This sheet is a summary. It may not cover all possible information. If you have questions about this medicine, talk to your doctor, pharmacist, or health care provider.  2017 Elsevier/Gold Standard (2012-03-17 11:40:58)

## 2016-05-28 NOTE — Progress Notes (Signed)
Patient is here for Re establish care   Patient has not taking his meds today and he has not eaten today   Patient needs refill on his medication

## 2016-05-29 LAB — LIPID PANEL
Cholesterol: 195 mg/dL (ref <200)
HDL: 45 mg/dL (ref >40)
LDL CALC: 128 mg/dL — AB (ref <100)
TRIGLYCERIDES: 108 mg/dL (ref <150)
Total CHOL/HDL Ratio: 4.3 Ratio (ref <5.0)
VLDL: 22 mg/dL (ref <30)

## 2016-05-29 LAB — BASIC METABOLIC PANEL WITH GFR
BUN: 18 mg/dL (ref 7–25)
CHLORIDE: 105 mmol/L (ref 98–110)
CO2: 28 mmol/L (ref 20–31)
CREATININE: 1.04 mg/dL (ref 0.60–1.35)
Calcium: 9.7 mg/dL (ref 8.6–10.3)
GFR, Est African American: >89 mL/min (ref >=60)
GFR, Est Non African American: 86 mL/min (ref >=60)
GLUCOSE: 98 mg/dL (ref 65–99)
Potassium: 4.8 mmol/L (ref 3.5–5.3)
SODIUM: 144 mmol/L (ref 135–146)

## 2016-05-29 LAB — MICROALBUMIN / CREATININE URINE RATIO
CREATININE, URINE: 179 mg/dL (ref 20–370)
Microalb Creat Ratio: 117 mcg/mg creat — ABNORMAL HIGH (ref <30)
Microalb, Ur: 20.9 mg/dL

## 2016-06-01 ENCOUNTER — Other Ambulatory Visit: Payer: Self-pay | Admitting: Internal Medicine

## 2016-06-04 ENCOUNTER — Other Ambulatory Visit: Payer: Self-pay | Admitting: Family Medicine

## 2016-06-04 DIAGNOSIS — E785 Hyperlipidemia, unspecified: Secondary | ICD-10-CM

## 2016-06-04 DIAGNOSIS — E1141 Type 2 diabetes mellitus with diabetic mononeuropathy: Secondary | ICD-10-CM

## 2016-06-04 MED ORDER — ATORVASTATIN CALCIUM 20 MG PO TABS: ORAL_TABLET | ORAL | 2 refills | Status: DC

## 2016-06-04 MED ORDER — ASPIRIN EC 81 MG PO TBEC: 81.0000 mg | DELAYED_RELEASE_TABLET | Freq: Every day | ORAL | 2 refills | Status: DC

## 2016-06-04 MED FILL — ATORVASTATIN 20 MG TABLET: 20 | 30 days supply | Qty: 30 | Fill #0

## 2016-06-07 ENCOUNTER — Telehealth: Payer: Self-pay | Admitting: *Deleted

## 2016-06-07 NOTE — Telephone Encounter (Signed)
Patient verified DOB Patient is aware of kidney function being normal. Patient advised to continue lisinopril to protect the kidneys. Patient is aware of cholesterol being elevated and needing to take medication to assist in lowering the level. Patient is aware of refills being placed to the pharmacy. Patient expressed his understanding and had no further questions at this time.

## 2016-06-07 NOTE — Telephone Encounter (Signed)
-----   Message from Lizbeth BarkMandesia R Hairston, FNP sent at 06/07/2016  8:40 AM EST ----- -Kidney function normal -Microalbumin/creatinine ratio level was elevated. This tests for protein in your urine. Take your lisinopril as prescribed. Will need to recheck at next 3 month visit for DM follow up. -Lipid level was elevated. This can increase your risk of heart disease. Your atorvastatin and aspirin was refilled to help lower your risk.

## 2016-06-08 ENCOUNTER — Telehealth: Payer: Self-pay

## 2016-06-08 NOTE — Telephone Encounter (Signed)
CMA call to go over lab results  Patient Verify DOB  Patient was aware and understood   

## 2016-06-08 NOTE — Progress Notes (Signed)
Patient asked why his microalbumin elevated even though he been taking his lisinopril like he should, he asked is there another strong medication or other solution for it?

## 2016-06-08 NOTE — Telephone Encounter (Signed)
-----   Message from Lizbeth BarkMandesia R Hairston, FNP sent at 06/04/2016  1:48 PM EST ----- -Kidney function normal -Microalbumin/creatinine ratio level was elevated. This tests for protein in your urine. Take your lisinopril as prescribed. Will need to recheck at next 3 month visit for DM follow up. -Lipid level was elevated. This can increase your risk of heart disease. Your atorvastatin and aspirin was refilled to help lower your risk.

## 2016-06-10 ENCOUNTER — Ambulatory Visit: Payer: 59 | Admitting: Pharmacist

## 2016-06-14 ENCOUNTER — Telehealth: Payer: Self-pay

## 2016-06-14 NOTE — Telephone Encounter (Signed)
-----   Message from Lizbeth BarkMandesia R Hairston, FNP sent at 06/10/2016  1:26 PM EST ----- Yes please information patient that better blood pressure control would be the solution. At last visit his blood pressure was elevated and additional anti-hypertensive was prescribed. Tell him to continue to take his blood pressure medications as directed and schedule appointment with clinical pharmacist for BP check as previously discussed at his last office visit. Recommend recheck of his urine microalbumin at next DM follow up visit in 3 months.    ----- Message ----- From: Roger KillJoanna Koltin Wehmeyer, CMA Sent: 06/08/2016   2:07 PM To: Lizbeth BarkMandesia R Hairston, FNP  Patient asked why his microalbumin elevated even though he been taking his lisinopril like he should, he asked is there another strong medication or other solution for it?

## 2016-06-14 NOTE — Telephone Encounter (Signed)
CMA call to advice  Patient understood

## 2016-06-14 NOTE — Progress Notes (Signed)
Patient ask if its good to take Omega XL supplement 300 mg?

## 2016-07-05 ENCOUNTER — Telehealth: Payer: Self-pay

## 2016-07-05 NOTE — Telephone Encounter (Signed)
-----   Message from Lizbeth BarkMandesia R Hairston, [place] sent at 06/30/2016  5:55 PM EST ----- Since he is already on a lipid and cholesterol lowering medication - atorvastatin. It is not necessary that he takes a fish oil supplement.   ----- Message ----- From: Roger KillJoanna Savanah Bayles, CMA Sent: 06/14/2016   9:05 AM To: Lizbeth BarkMandesia R Hairston, FNP  Patient ask if its good to take Omega XL supplement 300 mg?

## 2016-07-05 NOTE — Telephone Encounter (Signed)
CMA call to inform what his PCP advice him that is not necessary to take the fish oil supplement   Patient did not answer but CMA left a VM stating the advice & if have any question just to call back

## 2016-07-13 MED FILL — ?ATORVASTATIN 20 MG TABLET: 20 | 30 days supply | Qty: 30 | Fill #1

## 2016-07-21 ENCOUNTER — Ambulatory Visit: Payer: 59 | Attending: Family Medicine | Admitting: Pharmacist

## 2016-07-21 ENCOUNTER — Encounter: Payer: Self-pay | Admitting: Family Medicine

## 2016-07-21 ENCOUNTER — Ambulatory Visit (HOSPITAL_BASED_OUTPATIENT_CLINIC_OR_DEPARTMENT_OTHER): Payer: 59 | Admitting: Family Medicine

## 2016-07-21 ENCOUNTER — Other Ambulatory Visit: Payer: Self-pay

## 2016-07-21 VITALS — BP 127/91 | HR 88

## 2016-07-21 VITALS — BP 120/78 | HR 78 | Temp 98.3°F | Resp 18 | Ht 66.0 in | Wt 235.6 lb

## 2016-07-21 DIAGNOSIS — Z79899 Other long term (current) drug therapy: Secondary | ICD-10-CM | POA: Diagnosis not present

## 2016-07-21 DIAGNOSIS — H6121 Impacted cerumen, right ear: Secondary | ICD-10-CM | POA: Diagnosis not present

## 2016-07-21 DIAGNOSIS — E1141 Type 2 diabetes mellitus with diabetic mononeuropathy: Secondary | ICD-10-CM | POA: Diagnosis not present

## 2016-07-21 DIAGNOSIS — K30 Functional dyspepsia: Secondary | ICD-10-CM | POA: Diagnosis not present

## 2016-07-21 DIAGNOSIS — J302 Other seasonal allergic rhinitis: Secondary | ICD-10-CM | POA: Insufficient documentation

## 2016-07-21 DIAGNOSIS — E119 Type 2 diabetes mellitus without complications: Secondary | ICD-10-CM | POA: Insufficient documentation

## 2016-07-21 DIAGNOSIS — R002 Palpitations: Secondary | ICD-10-CM | POA: Insufficient documentation

## 2016-07-21 DIAGNOSIS — Z87898 Personal history of other specified conditions: Secondary | ICD-10-CM | POA: Diagnosis not present

## 2016-07-21 DIAGNOSIS — Z7982 Long term (current) use of aspirin: Secondary | ICD-10-CM | POA: Insufficient documentation

## 2016-07-21 DIAGNOSIS — Z7984 Long term (current) use of oral hypoglycemic drugs: Secondary | ICD-10-CM | POA: Insufficient documentation

## 2016-07-21 DIAGNOSIS — Z7951 Long term (current) use of inhaled steroids: Secondary | ICD-10-CM | POA: Insufficient documentation

## 2016-07-21 DIAGNOSIS — Z86718 Personal history of other venous thrombosis and embolism: Secondary | ICD-10-CM | POA: Insufficient documentation

## 2016-07-21 DIAGNOSIS — H1013 Acute atopic conjunctivitis, bilateral: Secondary | ICD-10-CM | POA: Insufficient documentation

## 2016-07-21 DIAGNOSIS — R079 Chest pain, unspecified: Secondary | ICD-10-CM | POA: Insufficient documentation

## 2016-07-21 DIAGNOSIS — H9202 Otalgia, left ear: Secondary | ICD-10-CM | POA: Diagnosis present

## 2016-07-21 LAB — GLUCOSE, POCT (MANUAL RESULT ENTRY): POC GLUCOSE: 126 mg/dL — AB (ref 70–99)

## 2016-07-21 LAB — CBC WITH DIFFERENTIAL/PLATELET
BASOS PCT: 0 %
Basophils Absolute: 0 cells/uL (ref 0–200)
Eosinophils Absolute: 53 cells/uL (ref 15–500)
Eosinophils Relative: 1 %
HCT: 49.5 % (ref 38.5–50.0)
Hemoglobin: 16.7 g/dL (ref 13.2–17.1)
Lymphocytes Relative: 52 %
Lymphs Abs: 2756 cells/uL (ref 850–3900)
MCH: 25.6 pg — AB (ref 27.0–33.0)
MCHC: 33.7 g/dL (ref 32.0–36.0)
MCV: 75.9 fL — ABNORMAL LOW (ref 80.0–100.0)
MONO ABS: 636 {cells}/uL (ref 200–950)
MONOS PCT: 12 %
MPV: 9.1 fL (ref 7.5–12.5)
NEUTROS ABS: 1855 {cells}/uL (ref 1500–7800)
Neutrophils Relative %: 35 %
PLATELETS: 216 10*3/uL (ref 140–400)
RBC: 6.52 MIL/uL — ABNORMAL HIGH (ref 4.20–5.80)
RDW: 15.2 % — ABNORMAL HIGH (ref 11.0–15.0)
WBC: 5.3 10*3/uL (ref 3.8–10.8)

## 2016-07-21 MED ORDER — OMEPRAZOLE 20 MG PO CPDR: 20.0000 mg | DELAYED_RELEASE_CAPSULE | Freq: Every day | ORAL | 1 refills | Status: DC

## 2016-07-21 MED ORDER — OLOPATADINE HCL 0.2 % OP SOLN: 1.0000 [drp] | Freq: Every day | OPHTHALMIC | 1 refills | Status: DC

## 2016-07-21 MED ORDER — FEXOFENADINE HCL 180 MG PO TABS: 180.0000 mg | ORAL_TABLET | Freq: Every day | ORAL | 6 refills | Status: DC

## 2016-07-21 MED ORDER — FLUTICASONE PROPIONATE 50 MCG/ACT NA SUSP: 2.0000 | Freq: Every day | NASAL | 6 refills | Status: DC

## 2016-07-21 MED ORDER — GLUCOSE BLOOD VI STRP: ORAL_STRIP | 12 refills | Status: DC

## 2016-07-21 MED FILL — OLOPATADINE HCL 0.2% EYE DR: 0.2 | 30 days supply | Qty: 3 | Fill #0

## 2016-07-21 MED FILL — OMEPRAZOLE 20 MG CAP: 20 | 30 days supply | Qty: 30 | Fill #0

## 2016-07-21 MED FILL — FLUTICASONE PROP 50 MCG SPR: 50 | 30 days supply | Qty: 16 | Fill #0

## 2016-07-21 NOTE — Patient Instructions (Addendum)
Thanks for coming to see us  Continue medications as prescribed  [facility]  Check your blood sugar at home at least once daily - this will help us to change medications if we need to in the future.

## 2016-07-21 NOTE — Progress Notes (Signed)
Patient is here for   Right eye sore & pressure  Left ear aching feels like water in there  Been having for 4 days know  Patient has not taking his mediation for today  Patient has not eaten today

## 2016-07-21 NOTE — Progress Notes (Signed)
Subjective:  Patient ID: Scott [name], male    DOB: 03/15/1971  Age: 46 y.o. MRN: [id number]  CC: No chief complaint on file.   HPI Scott [name] presents for  Chest pain : For 2 weeks after eating meals. History of blood clots in the past. Denies any SOB, hemoptysis, or swelling of the BLE. Reports 1 year history of occasional heart palpitations. Reports palpitations can occur any time of day. 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   Left ear aching and right eye drainage: For 4 days reports clear watery drainage from bilateral eye left greater than right. Reports white dried eye secretions when waking in the am. Also reports ear ache with left being greater than right. Denies any tinnitus or hearing loss. Reports taking OTC eye allergy relief drops and debrox drops with no relief of symptoms. Denies any vision changes, N/V, or headaches.  Outpatient Medications Prior to Visit  Medication Sig Dispense Refill  . amLODipine (NORVASC) 5 MG tablet Take 1 tablet (5 mg total) by mouth daily. 30 tablet 2  . aspirin EC 81 MG tablet Take 1 tablet (81 mg total) by mouth daily. 30 tablet 2  . atorvastatin (LIPITOR) 20 MG tablet TAKE ONE TABLET BY MOUTH DAILY 30 tablet 2  . carbamide peroxide (DEBROX) 6.5 % otic solution Place 10 drops into both ears 2 (two) times daily. 15 mL 0  . glucose blood (ONE TOUCH ULTRA TEST) test strip Use as instructed 100 each 12  . HYDROcodone-acetaminophen (NORCO) 5-325 MG tablet 1-2 tabs po q6 hours prn pain 20 tablet 0  . ketotifen (ZADITOR) 0.025 % ophthalmic solution Place 1 drop into both eyes 2 (two) times daily. 5 mL 0  . Linagliptin-Metformin HCl (JENTADUETO) 2.08-998 MG TABS Take 1 tablet by mouth daily. 30 tablet 2  . lisinopril (PRINIVIL,ZESTRIL) 10 MG tablet Take 1 tablet (10 mg total) by mouth daily. 3 tablet 2  . loratadine (CLARITIN) 10 MG tablet Take 1 tablet (10 mg total) by mouth daily. 30 tablet 1  . fluticasone (FLONASE) 50 MCG/ACT  nasal spray Place 2 sprays into both nostrils daily. 16 g 2   No facility-administered medications prior to visit.     ROS Review of Systems  HENT: Negative for hearing loss, rhinorrhea and sinus pressure.   Eyes: Positive for discharge.  Respiratory: Negative.   Cardiovascular: Positive for chest pain (after eating meals).  Gastrointestinal: Negative.     Objective:  BP 120/78 (BP Location: Left Arm, Patient Position: Sitting, Cuff Size: Normal)   Pulse 78   Temp 98.3 F (36.8 C) (Oral)   Resp 18   Ht 5\' 6"  (1.676 m)   Wt 235 lb 9.6 oz (106.9 kg)   SpO2 96%   BMI 38.03 kg/m   BP/Weight 07/21/2016 07/21/2016 05/28/2016  Systolic BP 120 127 141  Diastolic BP 78 91 89  Wt. (Lbs) 235.6 - 235  BMI 38.03 - 37.93   Physical Exam  HENT:  Head: Normocephalic.  Nose: Rhinorrhea (thick white nasal discharge present) present.  Mouth/Throat: Uvula is midline, oropharynx is clear and moist and mucous membranes are normal. No posterior oropharyngeal erythema.  Right ear: Impacted cerumen.   Eyes: Pupils are equal, round, and reactive to light. Right eye exhibits discharge (dried white drainage at outer canthus. ). Right eye exhibits no exudate. Left eye exhibits no discharge and no exudate. Right conjunctiva is not injected. Left conjunctiva is not injected. Right eye exhibits normal extraocular motion.  Left eye exhibits normal extraocular motion.  Neck: No JVD present.  Cardiovascular: Normal rate, regular rhythm, normal heart sounds and intact distal pulses.   Pulmonary/Chest: Effort normal and breath sounds normal.  Abdominal: Soft. Bowel sounds are normal.  Lymphadenopathy:    He has no cervical adenopathy.  Skin: Skin is warm and dry.  Nursing note and vitals reviewed.  Assessment & Plan:   Problem List Items Addressed This Visit      Endocrine   DM type 2 (diabetes mellitus, type 2) (HCC) (Chronic)   Relevant Orders   Glucose (CBG) (Completed)    Other Visit Diagnoses      Allergic conjunctivitis of both eyes    -  Primary   Relevant Medications   fexofenadine (ALLEGRA ALLERGY) 180 MG tablet   Olopatadine HCl (PATADAY) 0.2 % SOLN   Acute seasonal allergic rhinitis, unspecified trigger       Relevant Medications   fexofenadine (ALLEGRA ALLERGY) 180 MG tablet   fluticasone (FLONASE) 50 MCG/ACT nasal spray   Impacted cerumen of right ear       -Ear irrigation performed.    History of palpitations       -EKG performed in office.    Relevant Orders   ECHOCARDIOGRAM COMPLETE   Basic Metabolic Panel   CBC with Differential   Acid indigestion       Relevant Medications   omeprazole (PRILOSEC) 20 MG capsule     Meds ordered this encounter  Medications  . fexofenadine (ALLEGRA ALLERGY) 180 MG tablet    Sig: Take 1 tablet (180 mg total) by mouth daily.    Dispense:  30 tablet    Refill:  6    Order Specific Question:   Supervising Provider    Answer:   Quentin AngstJEGEDE, OLUGBEMIGA E [id number]  . fluticasone (FLONASE) 50 MCG/ACT nasal spray    Sig: Place 2 sprays into both nostrils daily.    Dispense:  16 g    Refill:  6    Order Specific Question:   Supervising Provider    Answer:   Quentin AngstJEGEDE, OLUGBEMIGA E [id number]  . Olopatadine HCl (PATADAY) 0.2 % SOLN    Sig: Apply 1 drop to eye daily.    Dispense:  2.5 Bottle    Refill:  1    Order Specific Question:   Supervising Provider    Answer:   Quentin AngstJEGEDE, OLUGBEMIGA E [id number]  . omeprazole (PRILOSEC) 20 MG capsule    Sig: Take 1 capsule (20 mg total) by mouth daily.    Dispense:  30 capsule    Refill:  1    Order Specific Question:   Supervising Provider    Answer:   Quentin AngstJEGEDE, OLUGBEMIGA E [id number]    Follow-up: Return if symptoms worsen or fail to improve.   Lizbeth BarkMandesia R Keirston Saephanh FNP

## 2016-07-21 NOTE — Progress Notes (Signed)
Patient with eye and ear pain and would like to be seen by PCP today.  BP Readings from Last 3 Encounters:  07/21/16 (!) 127/91  05/28/16 (!) 141/89  05/26/16 (!) 137/92   No home CBGs.  Patient encouraged to monitor home CBGs. Will reorder test strips for him. Will be seen by Arrie SenateMandesia Hairston, NP today.

## 2016-07-21 NOTE — Patient Instructions (Addendum)
Echocardiogram An echocardiogram, or echocardiography, uses sound waves (ultrasound) to produce an image of your heart. The echocardiogram is simple, painless, obtained within a short period of time, and offers valuable information to your health care provider. The images from an echocardiogram can provide information such as:  Evidence of coronary artery disease (CAD).  Heart size.  Heart muscle function.  Heart valve function.  Aneurysm detection.  Evidence of a past heart attack.  Fluid buildup around the heart.  Heart muscle thickening.  Assess heart valve function. Tell a health care provider about:  Any allergies you have.  All medicines you are taking, including vitamins, herbs, eye drops, creams, and over-the-counter medicines.  Any problems you or family members have had with anesthetic medicines.  Any blood disorders you have.  Any surgeries you have had.  Any medical conditions you have.  Whether you are pregnant or may be pregnant. What happens before the procedure? No special preparation is needed. Eat and drink normally. What happens during the procedure?  In order to produce an image of your heart, gel will be applied to your chest and a wand-like tool (transducer) will be moved over your chest. The gel will help transmit the sound waves from the transducer. The sound waves will harmlessly bounce off your heart to allow the heart images to be captured in real-time motion. These images will then be recorded.  You may need an IV to receive a medicine that improves the quality of the pictures. What happens after the procedure? You may return to your normal schedule including diet, activities, and medicines, unless your health care provider tells you otherwise. This information is not intended to replace advice given to you by your health care provider. Make sure you discuss any questions you have with your health care provider. Document Released: 04/16/2000  Document Revised: 12/06/2015 Document Reviewed: 12/25/2012 Elsevier Interactive Patient Education  2017 [employer] Inc. Allergic Conjunctivitis A clear membrane (conjunctiva) covers the white part of your eye and the inner surface of your eyelid. Allergic conjunctivitis happens when this membrane has inflammation. This is caused by allergies. Common causes of allergic reactions (allergens)include:  Outdoor allergens, such as:  Pollen.  Grass and weeds.  Mold spores.  Indoor allergens, such as:  Dust.  Smoke.  Mold.  Pet dander.  Animal hair. This condition can make your eye red or pink. It can also make your eye feel itchy. This condition cannot be spread from one person to another person (is not contagious). Follow these instructions at home:  Try not to be around things that you are allergic to.  Take or apply over-the-counter and prescription medicines only as told by your doctor. These include any eye drops.  Place a cool, clean washcloth on your eye for 10-20 minutes. Do this 3-4 times a day.  Do not touch or rub your eyes.  Do not wear contact lenses until the inflammation is gone. Wear glasses instead.  Do not wear eye makeup until the inflammation is gone.  Keep all follow-up visits as told by your doctor. This is important. Contact a doctor if:  Your symptoms get worse.  Your symptoms do not get better with treatment.  You have mild eye pain.  You are sensitive to light,  You have spots or blisters on your eyes.  You have pus coming from your eye.  You have a fever. Get help right away if:  You have redness, swelling, or other symptoms in only one eye.  Your  vision is blurry.  You have vision changes.  You have very bad eye pain. Summary  Allergic conjunctivitis is caused by allergies. It can make your eye red or pink, and it can make your eye feel itchy.  This condition cannot be spread from one person to another person (is not  contagious).  Try not to be around things that you are allergic to.  Take or apply over-the-counter and prescription medicines only as told by your doctor. These include any eye drops.  Contact your doctor if your symptoms get worse or they do not get better with treatment. This information is not intended to replace advice given to you by your health care provider. Make sure you discuss any questions you have with your health care provider. Document Released: 10/07/2009 Document Revised: 12/12/2015 Document Reviewed: 12/12/2015 Elsevier Interactive Patient Education  2017 [employer]

## 2016-07-22 LAB — BASIC METABOLIC PANEL
BUN: 16 mg/dL (ref 7–25)
CALCIUM: 9.8 mg/dL (ref 8.6–10.3)
CO2: 24 mmol/L (ref 20–31)
Chloride: 103 mmol/L (ref 98–110)
Creat: 1.04 mg/dL (ref 0.60–1.35)
GLUCOSE: 117 mg/dL — AB (ref 65–99)
POTASSIUM: 4.6 mmol/L (ref 3.5–5.3)
SODIUM: 141 mmol/L (ref 135–146)

## 2016-07-23 ENCOUNTER — Other Ambulatory Visit: Payer: Self-pay | Admitting: Family Medicine

## 2016-07-29 ENCOUNTER — Telehealth: Payer: Self-pay

## 2016-07-29 NOTE — Telephone Encounter (Signed)
-----   Message from Lizbeth BarkMandesia R Hairston, FNP sent at 07/29/2016  1:21 PM EDT ----- CBC lab showed slight increase in red blood cell count which appears to be chronic and stable.  Labs normal.

## 2016-07-29 NOTE — Telephone Encounter (Signed)
CMA call patient to go over lab results  Patient did not answer but left a VM regarding lab results

## 2016-07-30 ENCOUNTER — Other Ambulatory Visit: Payer: Self-pay | Admitting: Family Medicine

## 2016-07-30 DIAGNOSIS — I10 Essential (primary) hypertension: Secondary | ICD-10-CM

## 2016-08-03 ENCOUNTER — Ambulatory Visit (HOSPITAL_COMMUNITY)
Admission: RE | Admit: 2016-08-03 | Discharge: 2016-08-03 | Disposition: A | Discharge status: Home / Self Care | Payer: 59 | Source: Ambulatory Visit | Attending: Family Medicine | Admitting: Family Medicine

## 2016-08-03 DIAGNOSIS — Z86711 Personal history of pulmonary embolism: Secondary | ICD-10-CM | POA: Diagnosis not present

## 2016-08-03 DIAGNOSIS — I10 Essential (primary) hypertension: Secondary | ICD-10-CM | POA: Diagnosis not present

## 2016-08-03 DIAGNOSIS — R002 Palpitations: Secondary | ICD-10-CM | POA: Diagnosis not present

## 2016-08-03 DIAGNOSIS — Z87898 Personal history of other specified conditions: Secondary | ICD-10-CM | POA: Diagnosis not present

## 2016-08-03 NOTE — Progress Notes (Signed)
  Echocardiogram 2D Echocardiogram has been performed.  Janalyn Harder 08/03/2016, 9:04 AM

## 2016-08-04 ENCOUNTER — Telehealth: Payer: Self-pay

## 2016-08-04 ENCOUNTER — Other Ambulatory Visit: Payer: Self-pay | Admitting: Family Medicine

## 2016-08-04 DIAGNOSIS — R718 Other abnormality of red blood cells: Secondary | ICD-10-CM

## 2016-08-04 NOTE — Telephone Encounter (Signed)
Pt contacted the office and stated he received his labs on mychart but he is not understanding them. I went over lab results with pt. Pt states he would like to know why his RBC is high and what can he do to bring it down and what causes his RBC to be high. Please f/u

## 2016-08-04 NOTE — Telephone Encounter (Signed)
-----   Message from Lizbeth Bark, [place] sent at 08/03/2016  5:47 PM EDT ----- -Echocardiogram which looks at your heart structure, function, and pumping ability was normal. Mild enlargement which is commonly seen with hypertension. Continue to take your medications for blood pressure do not smoke, start eating a diet low in saturated fat. Limit your intake of fried foods, red meats, and whole milk. Increase physical activity.

## 2016-08-04 NOTE — Telephone Encounter (Signed)
Pt contacted the office and stated he received his labs on mychart but he is not understanding them. Debby Bud went over lab results with pt. Pt states he would like to know why his RBC is high and what can he do to bring it down and what causes his RBC to be high. Please advice?

## 2016-08-04 NOTE — Telephone Encounter (Signed)
CMA call to inform patient about RBC results & echocardiogram results  Patient did not answer but left a detailed message & if have any questions just to call me back

## 2016-08-04 NOTE — Telephone Encounter (Signed)
Pt contacted the office because he missed a call from the nurse. I went over echo results and information from [facility]. I informed pt that there was a referral placed for his RBC. I informed pt that referrals does take 1-2 weeks. Pt states he understands and doesn't have any questions or concerns

## 2016-08-04 NOTE — Telephone Encounter (Signed)
Please let patient know that his RBC levels are chronically elevated and that this is not a new finding. Will refer to hematology.

## 2016-08-05 NOTE — Telephone Encounter (Signed)
Patients TTE was approved by [facility] effective 08/02/16-5/17-18. Ref #: [id number]

## 2016-08-09 MED FILL — ?ATORVASTATIN 20 MG TABLET: 20 | 30 days supply | Qty: 30 | Fill #2

## 2016-08-20 ENCOUNTER — Encounter: Payer: Self-pay | Admitting: Hematology

## 2016-08-20 ENCOUNTER — Telehealth: Payer: Self-pay | Admitting: Hematology

## 2016-08-20 NOTE — Telephone Encounter (Signed)
Appt has been scheduled for the pt to see Dr. Candise Che on 5/14 at 11am. Pt aware to arrive 30 minutes early. Demographics verified. Letter mailed to the pt.

## 2016-08-28 LAB — HM DIABETES EYE EXAM

## 2016-09-07 ENCOUNTER — Ambulatory Visit: Payer: 59 | Admitting: Family Medicine

## 2016-09-07 ENCOUNTER — Encounter: Payer: Self-pay | Admitting: Family Medicine

## 2016-09-07 ENCOUNTER — Ambulatory Visit (INDEPENDENT_AMBULATORY_CARE_PROVIDER_SITE_OTHER): Payer: 59 | Admitting: Family Medicine

## 2016-09-07 VITALS — BP 135/90 | HR 76 | Ht 66.0 in | Wt 230.0 lb

## 2016-09-07 DIAGNOSIS — M7701 Medial epicondylitis, right elbow: Secondary | ICD-10-CM | POA: Diagnosis not present

## 2016-09-07 NOTE — Patient Instructions (Signed)
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 Tylenol or aleve as needed for pain. Start therapy twice a week for next 6 weeks. You can stop the nitro patches. I would not give another injection for this. Call or send me a message through mychart after 8-12 visits of therapy (or 4-6 weeks) and let me know how you're doing. If you're still struggling I would refer you to a surgeon.

## 2016-09-08 ENCOUNTER — Other Ambulatory Visit: Payer: Self-pay | Admitting: Family Medicine

## 2016-09-08 DIAGNOSIS — E785 Hyperlipidemia, unspecified: Secondary | ICD-10-CM

## 2016-09-08 MED FILL — ATORVASTATIN 20 MG TABLET: 20 | 30 days supply | Qty: 30 | Fill #0

## 2016-09-10 NOTE — Assessment & Plan Note (Signed)
s/p two injections without prolonged improvement - advised against a third injection.  Tried nitro patches without benefit.  start physical therapy for next 6 weeks.  If not improving after 4-6 weeks of therapy would recommend orthopedic referral to discuss surgical intervention.  Tylenol or aleve if needed  Continue home exercises.

## 2016-09-10 NOTE — Progress Notes (Signed)
PCP: Lizbeth BarkHairston, Mandesia R, FNP  Subjective:   HPI: Patient is a 46 y.o. male here for right elbow pain.  7/13: Patient returns with a 10 day history of medial right elbow pain. Started hurting the same day he was putting up a portable shed. Pain level 7/10 at rest, up to 10/10 with full extension and a lot of activity. No acute injury or trauma. Pain is sharp. No swelling, bruising. No skin changes, numbness.  8/14: Patient returns with continued medial right elbow pain. Pain severe 10/10 with movements of elbow. Worse when straightening out elbow. No new injury. No skin changes, numbness.  05/07/16: Patient reports his elbow completely improved after last visit, injection. THen over past month started to get pain medial right elbow again. Pain with lifting, sleeping. Pain 0/10 but up to 10/10 and sharp. No numbness, skin changes. Not tried anything for this the past month.  5/8: Patient returns with recurrent right medial elbow pain. Pain level 7/10 and sharp. Worse with movements at wrist, lifting items, at night. No swelling or bruising. Tried nitro patches, home exercises, two injections. No skin changes, numbness, radiation.  Past Medical History:  Diagnosis Date  . Carpal tunnel syndrome   . Diabetes mellitus   . DVT (deep venous thrombosis) (HCC)   . Hypertension   . Obesity   . Shoulder pain, bilateral   . Sleep apnea    was fitted for mask and never got machine - too costly    Current Outpatient Prescriptions on File Prior to Visit  Medication Sig Dispense Refill  . amLODipine (NORVASC) 5 MG tablet Take 1 tablet (5 mg total) by mouth daily. 30 tablet 2  . aspirin EC 81 MG tablet Take 1 tablet (81 mg total) by mouth daily. 30 tablet 2  . carbamide peroxide (DEBROX) 6.5 % otic solution Place 10 drops into both ears 2 (two) times daily. 15 mL 0  . fexofenadine (ALLEGRA ALLERGY) 180 MG tablet Take 1 tablet (180 mg total) by mouth daily. 30 tablet 6  .  fluticasone (FLONASE) 50 MCG/ACT nasal spray Place 2 sprays into both nostrils daily. 16 g 6  . glucose blood (ONE TOUCH ULTRA TEST) test strip Use as instructed 100 each 12  . HYDROcodone-acetaminophen (NORCO) 5-325 MG tablet 1-2 tabs po q6 hours prn pain 20 tablet 0  . ketotifen (ZADITOR) 0.025 % ophthalmic solution Place 1 drop into both eyes 2 (two) times daily. 5 mL 0  . Linagliptin-Metformin HCl (JENTADUETO) 2.08-998 MG TABS Take 1 tablet by mouth daily. 30 tablet 2  . lisinopril (PRINIVIL,ZESTRIL) 10 MG tablet Take 1 tablet (10 mg total) by mouth daily. 30 tablet 2  . loratadine (CLARITIN) 10 MG tablet Take 1 tablet (10 mg total) by mouth daily. 30 tablet 1  . Olopatadine HCl (PATADAY) 0.2 % SOLN Apply 1 drop to eye daily. 2.5 Bottle 1  . omeprazole (PRILOSEC) 20 MG capsule Take 1 capsule (20 mg total) by mouth daily. 30 capsule 1   No current facility-administered medications on file prior to visit.     Past Surgical History:  Procedure Laterality Date  . CARPAL TUNNEL RELEASE Right 12/04/2015   Procedure: RIGHT CARPAL TUNNEL RELEASE;  Surgeon: Betha LoaKevin Kuzma, MD;  Location: [facility];  Service: Orthopedics;  Laterality: Right;  RIGHT CARPAL TUNNEL RELEASE  . CARPAL TUNNEL RELEASE Right 12/2015   [facility]  . CARPAL TUNNEL RELEASE Left 04/01/2016   Procedure: LEFT CARPAL TUNNEL RELEASE;  Surgeon: Betha LoaKevin Kuzma, MD;  Location: [facility];  Service: Orthopedics;  Laterality: Left;  . right hand surgery    . ROTATOR CUFF REPAIR      No Known Allergies  Social History   Social History  . Marital status: Married    Spouse name: N/A  . Number of children: N/A  . Years of education: 12+   Occupational History  . real estate agent    Social History Main Topics  . Smoking status: Never Smoker  . Smokeless tobacco: Never Used  . Alcohol use No  . Drug use: No  . Sexual activity: Not on file   Other Topics Concern  . Not on file   Social History  Narrative   Lives at home.   Right-handed.   Occasional caffeine use.    Family History  Problem Relation Age of Onset  . Hypertension Mother   . Kidney disease Father   . Hypertension Other   . Hyperlipidemia Other   . Diabetes Other     BP 135/90   Pulse 76   Ht 5\' 6"  (1.676 m)   Wt 230 lb (104.3 kg)   BMI 37.12 kg/m   Review of Systems: See HPI above.    Objective:  Physical Exam:  Gen: NAD, comfortable in exam room  Right elbow: No gross deformity, swelling, bruising. TTP medial epicondyle. FROM elbow and wrist with pain on pronation.  No pain flexion of wrist or digits. Strength 5/5 all motions of elbow and wrist. Collateral ligaments intact. Negative tinels cubital tunnel. NVI distally.  Left elbow: FROM without pain.    Assessment & Plan:  1. Right medial epicondylitis - s/p two injections without prolonged improvement - advised against a third injection.  Tried nitro patches without benefit.  start physical therapy for next 6 weeks.  If not improving after 4-6 weeks of therapy would recommend orthopedic referral to discuss surgical intervention.  Tylenol or aleve if needed  Continue home exercises.

## 2016-09-13 ENCOUNTER — Encounter: Payer: Self-pay | Admitting: Hematology

## 2016-09-13 ENCOUNTER — Telehealth: Payer: Self-pay | Admitting: Hematology

## 2016-09-13 ENCOUNTER — Ambulatory Visit (HOSPITAL_BASED_OUTPATIENT_CLINIC_OR_DEPARTMENT_OTHER): Payer: 59 | Admitting: Hematology

## 2016-09-13 ENCOUNTER — Ambulatory Visit (HOSPITAL_BASED_OUTPATIENT_CLINIC_OR_DEPARTMENT_OTHER): Payer: 59

## 2016-09-13 VITALS — BP 132/101 | HR 79 | Temp 98.8°F | Resp 18 | Ht 66.0 in | Wt 232.4 lb

## 2016-09-13 DIAGNOSIS — I10 Essential (primary) hypertension: Secondary | ICD-10-CM

## 2016-09-13 DIAGNOSIS — E119 Type 2 diabetes mellitus without complications: Secondary | ICD-10-CM

## 2016-09-13 DIAGNOSIS — D751 Secondary polycythemia: Secondary | ICD-10-CM

## 2016-09-13 DIAGNOSIS — R718 Other abnormality of red blood cells: Secondary | ICD-10-CM | POA: Diagnosis not present

## 2016-09-13 DIAGNOSIS — Z86718 Personal history of other venous thrombosis and embolism: Secondary | ICD-10-CM

## 2016-09-13 LAB — CBC & DIFF AND RETIC
BASO%: 0.4 % (ref 0.0–2.0)
Basophils Absolute: 0 10*3/uL (ref 0.0–0.1)
EOS%: 1.2 % (ref 0.0–7.0)
Eosinophils Absolute: 0.1 10*3/uL (ref 0.0–0.5)
HEMATOCRIT: 49.9 % (ref 38.4–49.9)
HGB: 16.8 g/dL (ref 13.0–17.1)
Immature Retic Fract: 3.6 % (ref 3.00–10.60)
LYMPH%: 47.8 % (ref 14.0–49.0)
MCH: 25.8 pg — AB (ref 27.2–33.4)
MCHC: 33.7 g/dL (ref 32.0–36.0)
MCV: 76.8 fL — AB (ref 79.3–98.0)
MONO#: 0.5 10*3/uL (ref 0.1–0.9)
MONO%: 9.2 % (ref 0.0–14.0)
NEUT%: 41.4 % (ref 39.0–75.0)
NEUTROS ABS: 2 10*3/uL (ref 1.5–6.5)
PLATELETS: 195 10*3/uL (ref 140–400)
RBC: 6.5 10*6/uL — AB (ref 4.20–5.82)
RDW: 15 % — ABNORMAL HIGH (ref 11.0–14.6)
Retic %: 1.7 % (ref 0.80–1.80)
Retic Ct Abs: 110.5 10*3/uL — ABNORMAL HIGH (ref 34.80–93.90)
WBC: 4.9 10*3/uL (ref 4.0–10.3)
lymph#: 2.3 10*3/uL (ref 0.9–3.3)
nRBC: 0 % (ref 0–0)

## 2016-09-13 LAB — COMPREHENSIVE METABOLIC PANEL
ALK PHOS: 50 U/L (ref 40–150)
ALT: 50 U/L (ref 0–55)
AST: 27 U/L (ref 5–34)
Albumin: 4.6 g/dL (ref 3.5–5.0)
Anion Gap: 9 mEq/L (ref 3–11)
BILIRUBIN TOTAL: 1.25 mg/dL — AB (ref 0.20–1.20)
BUN: 17.7 mg/dL (ref 7.0–26.0)
CO2: 27 mEq/L (ref 22–29)
Calcium: 9.9 mg/dL (ref 8.4–10.4)
Chloride: 105 mEq/L (ref 98–109)
Creatinine: 1.1 mg/dL (ref 0.7–1.3)
Glucose: 99 mg/dl (ref 70–140)
Potassium: 4.7 mEq/L (ref 3.5–5.1)
Sodium: 141 mEq/L (ref 136–145)
Total Protein: 8 g/dL (ref 6.4–8.3)

## 2016-09-13 LAB — FERRITIN: FERRITIN: 65 ng/mL (ref 22–316)

## 2016-09-13 NOTE — Telephone Encounter (Signed)
Gave patient AVS - and sent patient for lab add on. No additional appts added - RTC based on labs..[name]

## 2016-09-13 NOTE — Patient Instructions (Signed)
Thank you for choosing [facility] to provide your oncology and hematology care.  To afford each patient quality time with our providers, please arrive 30 minutes before your scheduled appointment time.  If you arrive late for your appointment, you may be asked to reschedule.  We strive to give you quality time with our providers, and arriving late affects you and other patients whose appointments are after yours.  If you are a no show for multiple scheduled visits, you may be dismissed from the clinic at the providers discretion.   Again, thank you for choosing [facility], our hope is that these requests will decrease the amount of time that you wait before being seen by our physicians.  ______________________________________________________________________ Should you have questions after your visit to the [facility], please contact our office at [phone] between the hours of 8:30 and 4:30 p.m.    Voicemails left after 4:30p.m will not be returned until the following business day.   For prescription refill requests, please have your pharmacy contact us directly.  Please also try to allow 48 hours for prescription requests.   Please contact the scheduling department for questions regarding scheduling.  For scheduling of procedures such as PET scans, CT scans, MRI, Ultrasound, etc please contact central scheduling at [phone].   Resources For Cancer Patients and Caregivers:  American Cancer Society:  800-227-2345  Can help patients locate various types of support and financial assistance Cancer Care: 1-800-813-HOPE (4673) Provides financial assistance, online support groups, medication/co-pay assistance.   [place] DSS:  336-641-3447 Where to apply for food stamps, Medicaid, and utility assistance Medicare Rights Center: 800-333-4114 Helps people with Medicare understand their rights and benefits, navigate the Medicare system, and secure the  quality healthcare they deserve SCAT: 336-333-6589 [place] [employer]'s shared-ride transportation service for eligible riders who have a disability that prevents them from riding the fixed route bus.   For additional information on assistance programs please contact our social worker:   Grier Hock/Abigail Elmore:  [phone] 

## 2016-09-13 NOTE — Progress Notes (Signed)
[name]    HEMATOLOGY/ONCOLOGY CONSULTATION NOTE  Date of Service: 09/13/2016  Patient Care Team: Lizbeth Bark, FNP as PCP - General (Family Medicine) /Dr Guerry Bruin MD CHIEF COMPLAINTS/PURPOSE OF CONSULTATION:  Increased RBC counts  HISTORY OF PRESENTING ILLNESS:   Scott [name] is a wonderful 46 y.o. male who has been referred to [place] by Dr Guerry Bruin MD/.Hairston, Oren Beckmann, FNP  for evaluation and management of elevated RBC counts.  Patient has a history of hypertension, sleep apnea noncompliant with CPAP machine, obesity, diabetes mellitus, history of DVT and PE a few years ago related to ankle injury with immobility for which he was treated with Coumadin for one year.  Patient had recent labs with his primary care physician on 07/21/2016 that showed an elevated RBC count of 6.52 million per microliter with normal hemoglobin of 16.7 and normal hematocrit of 49.5. MCV of 75.9 with normal WBC count of 5.3k and normal platelet count of 216k. And review of labs the patient has had elevated RBC counts since at least September 2015 that we have labs in our system. He has also had persistent microcytic MCV.  He notes that his mother might have sickle cell trait though he is not sure. He notes that his brother who has a different father has sickle cell anemia. Patient has never had anemia or any other limitations to his activities.  No recent change in clinical status. No dizziness no headaches no abnormal bleeding no shortness of breath no chest pain no leg swelling no strokelike symptoms.  MEDICAL HISTORY:  Past Medical History:  Diagnosis Date  . Carpal tunnel syndrome   . Diabetes mellitus   . DVT (deep venous thrombosis) (HCC)   . Hypertension   . Obesity   . Shoulder pain, bilateral   . Sleep apnea    was fitted for mask and never got machine - too costly    SURGICAL HISTORY: Past Surgical History:  Procedure Laterality Date  . CARPAL TUNNEL RELEASE Right  12/04/2015   Procedure: RIGHT CARPAL TUNNEL RELEASE;  Surgeon: Betha Loa, MD;  Location: [facility];  Service: Orthopedics;  Laterality: Right;  RIGHT CARPAL TUNNEL RELEASE  . CARPAL TUNNEL RELEASE Right 12/2015   [facility]  . CARPAL TUNNEL RELEASE Left 04/01/2016   Procedure: LEFT CARPAL TUNNEL RELEASE;  Surgeon: Betha Loa, MD;  Location: [facility];  Service: Orthopedics;  Laterality: Left;  . right hand surgery    . ROTATOR CUFF REPAIR      SOCIAL HISTORY: Social History   Social History  . Marital status: Married    Spouse name: N/A  . Number of children: N/A  . Years of education: 12+   Occupational History  . real estate agent    Social History Main Topics  . Smoking status: Never Smoker  . Smokeless tobacco: Never Used  . Alcohol use No  . Drug use: No  . Sexual activity: Not on file   Other Topics Concern  . Not on file   Social History Narrative   Lives at home.   Right-handed.   Occasional caffeine use.    FAMILY HISTORY: Family History  Problem Relation Age of Onset  . Hypertension Mother   . Kidney disease Father   . Hypertension Other   . Hyperlipidemia Other   . Diabetes Other     ALLERGIES:  has No Known Allergies.  MEDICATIONS:  Current Outpatient Prescriptions  Medication Sig Dispense Refill  . amLODipine (NORVASC)  5 MG tablet Take 1 tablet (5 mg total) by mouth daily. 30 tablet 2  . aspirin EC 81 MG tablet Take 1 tablet (81 mg total) by mouth daily. 30 tablet 2  . atorvastatin (LIPITOR) 20 MG tablet TAKE ONE TABLET BY MOUTH DAILY 30 tablet 2  . fexofenadine (ALLEGRA ALLERGY) 180 MG tablet Take 1 tablet (180 mg total) by mouth daily. 30 tablet 6  . fluticasone (FLONASE) 50 MCG/ACT nasal spray Place 2 sprays into both nostrils daily. 16 g 6  . glucose blood (ONE TOUCH ULTRA TEST) test strip Use as instructed 100 each 12  . ketotifen (ZADITOR) 0.025 % ophthalmic solution Place 1 drop into both eyes 2 (two) times  daily. 5 mL 0  . Linagliptin-Metformin HCl (JENTADUETO) 2.08-998 MG TABS Take 1 tablet by mouth daily. 30 tablet 2  . lisinopril (PRINIVIL,ZESTRIL) 10 MG tablet Take 1 tablet (10 mg total) by mouth daily. 30 tablet 2  . Olopatadine HCl (PATADAY) 0.2 % SOLN Apply 1 drop to eye daily. 2.5 Bottle 1  . omeprazole (PRILOSEC) 20 MG capsule Take 1 capsule (20 mg total) by mouth daily. 30 capsule 1   No current facility-administered medications for this visit.     REVIEW OF SYSTEMS:    10 Point review of Systems was done is negative except as noted above.  PHYSICAL EXAMINATION: ECOG PERFORMANCE STATUS: 0 - Asymptomatic  . Vitals:   09/13/16 1138  BP: (!) 132/101  Pulse: 79  Resp: 18  Temp: 98.8 F (37.1 C)   Filed Weights   09/13/16 1138  Weight: 232 lb 6.4 oz (105.4 kg)   .Body mass index is 37.51 kg/m.  GENERAL:alert, in no acute distress and comfortable SKIN: no acute rashes, no significant lesions EYES: conjunctiva are pink and non-injected, sclera anicteric OROPHARYNX: MMM, no exudates, no oropharyngeal erythema or ulceration NECK: supple, no JVD LYMPH:  no palpable lymphadenopathy in the cervical, axillary or inguinal regions LUNGS: clear to auscultation b/l with normal respiratory effort HEART: regular rate & rhythm ABDOMEN:  normoactive bowel sounds , non tender, not distended, no palpable hepatosplenomegaly. Extremity: no pedal edema PSYCH: alert & oriented x 3 with fluent speech NEURO: no focal motor/sensory deficits  LABORATORY DATA:  I have reviewed the data as listed  . CBC Latest Ref Rng & Units 07/21/2016 03/04/2016 02/02/2014  WBC 3.8 - 10.8 K/uL 5.3 8.4 5.0  Hemoglobin 13.2 - 17.1 g/dL 16.1 09.6 04.5  Hematocrit 38.5 - 50.0 % 49.5 47.7 47.1  Platelets 140 - 400 K/uL 216 203 202   . CBC    Component Value Date/Time   WBC 5.3 07/21/2016 1236   RBC 6.52 (H) 07/21/2016 1236   HGB 16.7 07/21/2016 1236   HCT 49.5 07/21/2016 1236   PLT 216 07/21/2016 1236    MCV 75.9 (L) 07/21/2016 1236   MCH 25.6 (L) 07/21/2016 1236   MCHC 33.7 07/21/2016 1236   RDW 15.2 (H) 07/21/2016 1236   LYMPHSABS 2,756 07/21/2016 1236   MONOABS 636 07/21/2016 1236   EOSABS 53 07/21/2016 1236   BASOSABS 0 07/21/2016 1236     . CMP Latest Ref Rng & Units 07/21/2016 05/28/2016 03/04/2016  Glucose 65 - 99 mg/dL 409(W) 98 119(J)  BUN 7 - 25 mg/dL 16 18 16   Creatinine 0.60 - 1.35 mg/dL 4.78 2.95 6.21  Sodium 135 - 146 mmol/L 141 144 140  Potassium 3.5 - 5.3 mmol/L 4.6 4.8 4.0  Chloride 98 - 110 mmol/L 103 105 105  CO2  20 - 31 mmol/L 24 28 26   Calcium 8.6 - 10.3 mg/dL 9.8 9.7 9.2  Total Protein 6.5 - 8.1 g/dL - - 7.5  Total Bilirubin 0.3 - 1.2 mg/dL - - 1.0  Alkaline Phos 38 - 126 U/L - - 51  AST 15 - 41 U/L - - 26  ALT 17 - 63 U/L - - 29     RADIOGRAPHIC STUDIES: I have personally reviewed the radiological images as listed and agreed with the findings in the report. No results found.  ASSESSMENT & PLAN:   [age] neural African-American man with   #1 microcytic relative polycythemia Patient has elevated RBC counts which have been persistent for at least 2015. This is likely begin present longer than that and might represent a hemoglobinopathy such as alpha thalassemia trait or hemoglobin C trait among other possible hemoglobinopathies. Sometimes it could also represent polycythemia vera with iron restricted erythropoiesis. Though this is less likely. Plan -Explained to the patient that this is currently only a lab finding and does not seem to be affecting him clinically. He would want to know what is causing this. -We have sent out a hemoglobin electrophoresis, as a thalassemia gene deletion studies. 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 -Ferritin and iron profile. -Follow-up with primary care physician to optimize treatment of sleep apnea. He was counseled that this could also cause secondary polycythemia and could cause uncontrolled hypertension and  additional long term side effects if untreated. -Counseled on adequate hydration and left modifications for weight loss.  We will call the patient with the results of his labs. Follow-up in clinic on an as-needed basis.   All of the patients questions were answered with apparent satisfaction. The patient knows to call the clinic with any problems, questions or concerns.  I spent 35 minutes counseling the patient face to face. The total time spent in the appointment was 45 minutes and more than 50% was on counseling and direct patient cares.    Wyvonnia LoraGautam Kale MD MS AAHIVMS [facility]  (Office):       [phone] (Work cell):  [phone] (Fax):           [phone]  09/13/2016 12:18 PM

## 2016-09-14 LAB — HEMOGLOBINOPATHY EVALUATION
HEMOGLOBIN A2 QUANTITATION: 2.4 % (ref 1.8–3.2)
HEMOGLOBIN F QUANTITATION: 0 % (ref 0.0–2.0)
HGB C: 0 %
HGB S: 0 %
HGB VARIANT: 0 %
Hgb A: 97.6 % (ref 96.4–98.8)

## 2016-09-20 LAB — ALPHA-THALASSEMIA GENOTYPR

## 2016-10-13 ENCOUNTER — Telehealth: Payer: Self-pay | Admitting: Family Medicine

## 2016-10-13 NOTE — Telephone Encounter (Signed)
PT called to request a refill for lisinopril (PRINIVIL,ZESTRIL) 10 MG tablet   please follow up with PT

## 2016-10-14 ENCOUNTER — Other Ambulatory Visit: Payer: Self-pay | Admitting: Family Medicine

## 2016-10-14 DIAGNOSIS — I10 Essential (primary) hypertension: Secondary | ICD-10-CM

## 2016-10-14 MED ORDER — LISINOPRIL 10 MG PO TABS: 10.0000 mg | ORAL_TABLET | Freq: Every day | ORAL | 0 refills | Status: DC

## 2016-10-14 MED ORDER — AMLODIPINE BESYLATE 5 MG PO TABS: 5.0000 mg | ORAL_TABLET | Freq: Every day | ORAL | 0 refills | Status: DC

## 2016-10-14 NOTE — Telephone Encounter (Signed)
PT called to request a refill for lisinopril (PRINIVIL,ZESTRIL) 10 MG tablet   please advice?

## 2016-10-15 ENCOUNTER — Other Ambulatory Visit: Payer: Self-pay | Admitting: Family Medicine

## 2016-10-15 NOTE — Telephone Encounter (Signed)
No. I did not prescribe patient Invokana and it is not listed on his current medication list.

## 2016-10-15 NOTE — Telephone Encounter (Signed)
Patient was last seen in office on 07/21/2016. Refill for lisinopril and amlodipine sent to pharmacy on file. Recommend scheduling follow up appointment for HTN/DM.

## 2016-10-15 NOTE — Telephone Encounter (Signed)
CMA call regarding medication refill   Patient Verify DOB  Patient was aware and understood  

## 2016-10-15 NOTE — Telephone Encounter (Signed)
Patient was aware and understood of PCP advice

## 2016-10-15 NOTE — Telephone Encounter (Signed)
Patient asked if could also refill on invokana 300 mg

## 2016-10-24 ENCOUNTER — Other Ambulatory Visit: Payer: Self-pay | Admitting: Internal Medicine

## 2016-10-24 DIAGNOSIS — E785 Hyperlipidemia, unspecified: Secondary | ICD-10-CM

## 2016-10-25 ENCOUNTER — Other Ambulatory Visit: Payer: Self-pay | Admitting: Pharmacist

## 2016-10-25 DIAGNOSIS — E1141 Type 2 diabetes mellitus with diabetic mononeuropathy: Secondary | ICD-10-CM

## 2016-10-25 DIAGNOSIS — E785 Hyperlipidemia, unspecified: Secondary | ICD-10-CM

## 2016-10-25 MED ORDER — ATORVASTATIN CALCIUM 20 MG PO TABS: 20.0000 mg | ORAL_TABLET | Freq: Every day | ORAL | 0 refills | Status: DC

## 2016-10-25 MED ORDER — LINAGLIPTIN-METFORMIN HCL 2.5-1000 MG PO TABS: 1.0000 | ORAL_TABLET | Freq: Every day | ORAL | 0 refills | Status: DC

## 2016-11-05 ENCOUNTER — Encounter: Payer: Self-pay | Admitting: Family Medicine

## 2016-11-05 ENCOUNTER — Ambulatory Visit: Payer: 59 | Attending: Family Medicine | Admitting: Family Medicine

## 2016-11-05 ENCOUNTER — Other Ambulatory Visit: Payer: Self-pay

## 2016-11-05 VITALS — BP 119/76 | HR 79 | Temp 98.3°F | Resp 18 | Ht 66.0 in | Wt 241.2 lb

## 2016-11-05 DIAGNOSIS — Z0000 Encounter for general adult medical examination without abnormal findings: Secondary | ICD-10-CM | POA: Diagnosis not present

## 2016-11-05 DIAGNOSIS — K219 Gastro-esophageal reflux disease without esophagitis: Secondary | ICD-10-CM | POA: Diagnosis not present

## 2016-11-05 DIAGNOSIS — Z86711 Personal history of pulmonary embolism: Secondary | ICD-10-CM | POA: Insufficient documentation

## 2016-11-05 DIAGNOSIS — E1165 Type 2 diabetes mellitus with hyperglycemia: Secondary | ICD-10-CM

## 2016-11-05 DIAGNOSIS — Z760 Encounter for issue of repeat prescription: Secondary | ICD-10-CM | POA: Diagnosis not present

## 2016-11-05 DIAGNOSIS — E785 Hyperlipidemia, unspecified: Secondary | ICD-10-CM | POA: Diagnosis not present

## 2016-11-05 DIAGNOSIS — Z87898 Personal history of other specified conditions: Secondary | ICD-10-CM | POA: Diagnosis not present

## 2016-11-05 DIAGNOSIS — E1100 Type 2 diabetes mellitus with hyperosmolarity without nonketotic hyperglycemic-hyperosmolar coma (NKHHC): Secondary | ICD-10-CM

## 2016-11-05 DIAGNOSIS — I10 Essential (primary) hypertension: Secondary | ICD-10-CM | POA: Diagnosis not present

## 2016-11-05 DIAGNOSIS — M722 Plantar fascial fibromatosis: Secondary | ICD-10-CM

## 2016-11-05 DIAGNOSIS — E119 Type 2 diabetes mellitus without complications: Secondary | ICD-10-CM | POA: Diagnosis present

## 2016-11-05 DIAGNOSIS — Z7982 Long term (current) use of aspirin: Secondary | ICD-10-CM | POA: Diagnosis not present

## 2016-11-05 DIAGNOSIS — R071 Chest pain on breathing: Secondary | ICD-10-CM | POA: Diagnosis present

## 2016-11-05 LAB — GLUCOSE, POCT (MANUAL RESULT ENTRY): POC GLUCOSE: 180 mg/dL — AB (ref 70–99)

## 2016-11-05 LAB — POCT GLYCOSYLATED HEMOGLOBIN (HGB A1C): HEMOGLOBIN A1C: 7.6

## 2016-11-05 MED ORDER — AMLODIPINE BESYLATE 5 MG PO TABS: 5.0000 mg | ORAL_TABLET | Freq: Every day | ORAL | 3 refills | Status: DC

## 2016-11-05 MED ORDER — IBUPROFEN 600 MG PO TABS: 600.0000 mg | ORAL_TABLET | Freq: Three times a day (TID) | ORAL | 1 refills | Status: DC | PRN

## 2016-11-05 MED ORDER — LINAGLIPTIN-METFORMIN HCL 2.5-1000 MG PO TABS
1.0000 | ORAL_TABLET | Freq: Two times a day (BID) | ORAL | 3 refills | Status: DC
Start: 2016-11-05 — End: 2016-11-24

## 2016-11-05 MED ORDER — ASPIRIN EC 81 MG PO TBEC: 81.0000 mg | DELAYED_RELEASE_TABLET | Freq: Every day | ORAL | 3 refills | Status: DC

## 2016-11-05 MED ORDER — OMEPRAZOLE 20 MG PO CPDR: 20.0000 mg | DELAYED_RELEASE_CAPSULE | Freq: Every day | ORAL | 3 refills | Status: DC

## 2016-11-05 MED ORDER — PNEUMOCOCCAL 13-VAL CONJ VACC IM SUSP
0.5000 mL | Freq: Once | INTRAMUSCULAR | Status: AC
  Administered 2016-11-05: 0.5 mL via INTRAMUSCULAR

## 2016-11-05 MED ORDER — PNEUMOCOCCAL 13-VAL CONJ VACC IM SUSP: 0.5000 mL | INTRAMUSCULAR | Status: DC

## 2016-11-05 MED ORDER — LISINOPRIL 20 MG PO TABS: 20.0000 mg | ORAL_TABLET | Freq: Every day | ORAL | 3 refills | Status: DC

## 2016-11-05 MED ORDER — ATORVASTATIN CALCIUM 20 MG PO TABS: 20.0000 mg | ORAL_TABLET | Freq: Every day | ORAL | 3 refills | Status: DC

## 2016-11-05 NOTE — Progress Notes (Signed)
Subjective:  Patient ID: Scott [name], male    DOB: May 01, 1971  Age: 46 y.o. MRN: [id number]  CC: Diabetes and Hypertension   HPI Scott [name] presents for follow up for DM, HTN, and medication refills. Diabetic symptoms: hyperglycemia CBG's ranging 150's-180's. Symptoms have gradually worsened. Patient denies foot ulcerations, nausea, paresthesia of the feet, polydipsia, polyuria, visual disturbances and vomitting.  Evaluation to date has been included: fasting blood sugar, fasting lipid panel, hemoglobin A1C and microalbuminuria.  Home sugars: BGs are running  consistent with Hgb A1C, BGs range between 150 and 180. He reports only checking his blood glucose once dialy.  Treatment to date: Juodueto which has been somewhat effective.  History of HTN.  He is exercising and is adherent to low salt diet. Cardiac symptoms intermitment chest tightness with excercise that began two months ago. Patient denies chest pain, claudication, dyspnea, fatigue, near-syncope, palpitations and syncope.  History of PE. Well score 0.  Cardiovascular risk factors: diabetes mellitus, dyslipidemia, hypertension and male gender. Use of agents associated with hypertension: NSAIDS. History of target organ damage: left ventricular hypertrophy. He reports taking lisinopril 20 mg QD instead 10 mg QD as prescribed. He also complains of myalgias for which has been present for several weeks. Pain is located in left sole of foot, is described as aching and sharp, and is intermittent .  Associated symptoms include: none.  The patient has tried nothing recently for pain relief.  Related to injury:   no.    Outpatient Medications Prior to Visit  Medication Sig Dispense Refill  . fexofenadine (ALLEGRA ALLERGY) 180 MG tablet Take 1 tablet (180 mg total) by mouth daily. 30 tablet 6  . fluticasone (FLONASE) 50 MCG/ACT nasal spray Place 2 sprays into both nostrils daily. 16 g 6  . glucose blood (ONE TOUCH ULTRA TEST) test strip  Use as instructed 100 each 12  . ketotifen (ZADITOR) 0.025 % ophthalmic solution Place 1 drop into both eyes 2 (two) times daily. 5 mL 0  . Olopatadine HCl (PATADAY) 0.2 % SOLN Apply 1 drop to eye daily. 2.5 Bottle 1  . amLODipine (NORVASC) 5 MG tablet Take 1 tablet (5 mg total) by mouth daily. 30 tablet 0  . aspirin EC 81 MG tablet Take 1 tablet (81 mg total) by mouth daily. 30 tablet 2  . atorvastatin (LIPITOR) 20 MG tablet Take 1 tablet (20 mg total) by mouth daily. 90 tablet 0  . Linagliptin-Metformin HCl (JENTADUETO) 2.08-998 MG TABS Take 1 tablet by mouth daily. 30 tablet 0  . lisinopril (PRINIVIL,ZESTRIL) 10 MG tablet Take 1 tablet (10 mg total) by mouth daily. 30 tablet 0  . omeprazole (PRILOSEC) 20 MG capsule Take 1 capsule (20 mg total) by mouth daily. 30 capsule 1   No facility-administered medications prior to visit.     ROS Review of Systems  Constitutional: Negative.   Eyes: Negative.   Respiratory: Positive for chest tightness (with excercise).   Cardiovascular: Negative.   Gastrointestinal: Negative for abdominal pain, nausea and vomiting.       Heartburn  Endocrine: Negative.   Musculoskeletal: Positive for myalgias.  Skin: Negative.   Neurological: Negative.    Objective:  BP 119/76 (BP Location: Left Arm, Patient Position: Sitting, Cuff Size: Normal)   Pulse 79   Temp 98.3 F (36.8 C) (Oral)   Resp 18   Ht 5\' 6"  (1.676 m)   Wt 241 lb 3.2 oz (109.4 kg)   SpO2 99%   BMI 38.93  kg/m   BP/Weight 11/05/2016 09/13/2016 09/07/2016  Systolic BP 119 132 135  Diastolic BP 76 101 90  Wt. (Lbs) 241.2 232.4 230  BMI 38.93 37.51 37.12   Physical Exam  Constitutional: He appears well-developed and well-nourished.  HENT:  Head: Normocephalic and atraumatic.  Right Ear: External ear normal.  Left Ear: External ear normal.  Nose: Nose normal.  Mouth/Throat: Oropharynx is clear and moist.  Eyes: Conjunctivae are normal. Pupils are equal, round, and reactive to light.    Neck: No JVD present.  Cardiovascular: Normal rate, regular rhythm, normal heart sounds, intact distal pulses and normal pulses.   No murmur heard. Pulmonary/Chest: Effort normal and breath sounds normal.  Abdominal: Soft. Bowel sounds are normal. There is no tenderness.  Musculoskeletal:  Left plantar foot pain with weight bearing.  Skin: Skin is warm and dry.  Psychiatric: His mood appears anxious.  Nursing note and vitals reviewed.   Assessment & Plan:   Problem List Items Addressed This Visit      Cardiovascular and Mediastinum   HTN (hypertension), benign   Relevant Medications   lisinopril (PRINIVIL,ZESTRIL) 20 MG tablet   amLODipine (NORVASC) 5 MG tablet   aspirin EC 81 MG tablet   atorvastatin (LIPITOR) 20 MG tablet     Endocrine   DM type 2 (diabetes mellitus, type 2) (HCC) (Chronic)   Increased Jentadueto to BID    Follow up with clinical pharmacist in 2 weeks    Follow up with PCP in 3 months   Relevant Medications   Linagliptin-Metformin HCl (JENTADUETO) 2.08-998 MG TABS   lisinopril (PRINIVIL,ZESTRIL) 20 MG tablet   aspirin EC 81 MG tablet   atorvastatin (LIPITOR) 20 MG tablet   Other Relevant Orders   Glucose (CBG) (Completed)   HgB A1c (Completed)   Lipid Panel    Other Visit Diagnoses    History of exertional chest pain    -  Primary   Refer for to cardiology for stress test evaluation.    History of PE in the past. Wells score 0, however will add d-dimer/ CXR to further rule out.   Relevant Orders   CBC With Differential   [facility], quantitative ([facility])   Basic Metabolic Panel   DG Chest 2 View   Ambulatory referral to Cardiology   Plantar fasciitis       Relevant Medications   ibuprofen (ADVIL,MOTRIN) 600 MG tablet   Other Relevant Orders   Ambulatory referral to Orthopedics   Gastroesophageal reflux disease without esophagitis       Relevant Medications   omeprazole (PRILOSEC) 20 MG capsule   Medication refill       Relevant  Medications   lisinopril (PRINIVIL,ZESTRIL) 20 MG tablet   amLODipine (NORVASC) 5 MG tablet   aspirin EC 81 MG tablet   Healthcare maintenance       Relevant Medications   pneumococcal 13-valent conjugate vaccine (PREVNAR 13) injection 0.5 mL (Completed)   Other Relevant Orders   HIV antibody (with reflex)      Meds ordered this encounter  Medications  . Linagliptin-Metformin HCl (JENTADUETO) 2.08-998 MG TABS    Sig: Take 1 tablet by mouth 2 (two) times daily with a meal.    Dispense:  180 tablet    Refill:  3    Must have office visit for refills    Order Specific Question:   Supervising Provider    Answer:   Quentin AngstJEGEDE, OLUGBEMIGA E [id number]]  . lisinopril (PRINIVIL,ZESTRIL) 20 MG tablet  Sig: Take 1 tablet (20 mg total) by mouth daily.    Dispense:  90 tablet    Refill:  3    Must have office visit for refills.    Order Specific Question:   Supervising Provider    Answer:   Quentin Angst [id number]  . amLODipine (NORVASC) 5 MG tablet    Sig: Take 1 tablet (5 mg total) by mouth daily.    Dispense:  90 tablet    Refill:  3    Must have office visit for refills.    Order Specific Question:   Supervising Provider    Answer:   Quentin Angst [id number]  . aspirin EC 81 MG tablet    Sig: Take 1 tablet (81 mg total) by mouth daily.    Dispense:  90 tablet    Refill:  3    Order Specific Question:   Supervising Provider    Answer:   Quentin Angst [id number]  . omeprazole (PRILOSEC) 20 MG capsule    Sig: Take 1 capsule (20 mg total) by mouth daily.    Dispense:  90 capsule    Refill:  3    Must have office visit for refills.    Order Specific Question:   Supervising Provider    Answer:   Quentin Angst [id number]  . atorvastatin (LIPITOR) 20 MG tablet    Sig: Take 1 tablet (20 mg total) by mouth daily.    Dispense:  90 tablet    Refill:  3    Must have office visit for refills.    Order Specific Question:   Supervising Provider    Answer:   Quentin Angst [id number]  . ibuprofen (ADVIL,MOTRIN) 600 MG tablet    Sig: Take 1 tablet (600 mg total) by mouth every 8 (eight) hours as needed (Take with food.).    Dispense:  30 tablet    Refill:  1    Order Specific Question:   Supervising Provider    Answer:   Quentin Angst [id number]  . DISCONTD: pneumococcal 13-valent conjugate vaccine (PREVNAR 13) injection 0.5 mL  . pneumococcal 13-valent conjugate vaccine (PREVNAR 13) injection 0.5 mL    Follow-up: Return in about 2 weeks (around 11/19/2016), or if symptoms worsen or fail to improve, for DM with clinical pharmacist.   Lizbeth Bark FNP

## 2016-11-05 NOTE — Progress Notes (Signed)
Patient is here for f/up   Patient complains about left heel pain/ every now & than chest tightness   Patient has not eaten for today

## 2016-11-05 NOTE — Patient Instructions (Addendum)
Angina Pectoris Angina pectoris is a very bad feeling in the chest, neck, or arm. Your doctor may call it angina. There are four types of angina. Angina is caused by a lack of blood in the middle and thickest layer of the heart wall (myocardium). Angina may feel like a crushing or squeezing pain in the chest. It may feel like tightness or heavy pressure in the chest. Some people say it feels like gas, heartburn, or indigestion. Some people have symptoms other than pain. These include:  Shortness of breath.  Cold sweats.  Feeling sick to your stomach (nausea).  Feeling light-headed.  Many women have chest discomfort and some of the other symptoms. However, women often have different symptoms, such as:  Feeling tired (fatigue).  Feeling nervous for no reason.  Feeling weak for no reason.  Dizziness or fainting.  Women may have angina without any symptoms. Follow these instructions at home:  Take medicines only as told by your doctor.  Take care of other health issues as told by your doctor. These include: ? High blood pressure (hypertension). ? Diabetes.  Follow a heart-healthy diet. Your doctor can help you to choose healthy food options and make changes.  Talk to your doctor to learn more about healthy cooking methods and use them. These include: ? Roasting. ? Grilling. ? Broiling. ? Baking. ? Poaching. ? Steaming. ? Stir-frying.  Follow an exercise program approved by your doctor.  Keep a healthy weight. Lose weight as told by your doctor.  Rest when you are tired.  Learn to manage stress.  Do not use any tobacco, such as cigarettes, chewing tobacco, or electronic cigarettes. If you need help quitting, ask your doctor.  If you drink alcohol, and your doctor says it is okay, limit yourself to no more than 1 drink per day. One drink equals 12 ounces of beer, 5 ounces of wine, or 1 ounces of hard liquor.  Stop illegal drug use.  Keep all follow-up visits as told  by your doctor. This is important. Do not take these medicines unless your doctor says that you can:  Nonsteroidal anti-inflammatory drugs (NSAIDs). These include: ? Ibuprofen. ? Naproxen. ? Celecoxib.  Vitamin supplements that have vitamin A, vitamin E, or both.  Hormone therapy that contains estrogen with or without progestin.  Get help right away if:  You have pain in your chest, neck, arm, jaw, stomach, or back that: ? Lasts more than a few minutes. ? Comes back. ? Does not get better after you take medicine under your tongue (sublingual nitroglycerin).  You have any of these symptoms for no reason: ? Gas, heartburn, or indigestion. ? Sweating a lot. ? Shortness of breath or trouble breathing. ? Feeling sick to your stomach or throwing up. ? Feeling more tired than usual. ? Feeling nervous or worrying more than usual. ? Feeling weak. ? Diarrhea.  You are suddenly dizzy or light-headed.  You faint or pass out. These symptoms may be an emergency. Do not wait to see if the symptoms will go away. Get medical help right away. Call your local emergency services (911 in the [place] Do not drive yourself to the hospital. This information is not intended to replace advice given to you by your health care provider. Make sure you discuss any questions you have with your health care provider. Document Released: 10/06/2007 Document Revised: 09/25/2015 Document Reviewed: 08/21/2013 Elsevier Interactive Patient Education  2017 Elsevier Inc.   Plantar Fasciitis Plantar fasciitis is a painful foot  condition that affects the heel. It occurs when the band of tissue that connects the toes to the heel bone (plantar fascia) becomes irritated. This can happen after exercising too much or doing other repetitive activities (overuse injury). The pain from plantar fasciitis can range from mild irritation to severe pain that makes it difficult for you to walk or move. The pain is usually worse in the  morning or after you have been sitting or lying down for a while. What are the causes? This condition may be caused by:  Standing for long periods of time.  Wearing shoes that do not fit.  Doing high-impact activities, including running, aerobics, and ballet.  Being overweight.  Having an abnormal way of walking (gait).  Having tight calf muscles.  Having high arches in your feet.  Starting a new athletic activity.  What are the signs or symptoms? The main symptom of this condition is heel pain. Other symptoms include:  Pain that gets worse after activity or exercise.  Pain that is worse in the morning or after resting.  Pain that goes away after you walk for a few minutes.  How is this diagnosed? This condition may be diagnosed based on your signs and symptoms. Your health care provider will also do a physical exam to check for:  A tender area on the bottom of your foot.  A high arch in your foot.  Pain when you move your foot.  Difficulty moving your foot.  You may also need to have imaging studies to confirm the diagnosis. These can include:  X-rays.  Ultrasound.  MRI.  How is this treated? Treatment for plantar fasciitis depends on the severity of the condition. Your treatment may include:  Rest, ice, and over-the-counter pain medicines to manage your pain.  Exercises to stretch your calves and your plantar fascia.  A splint that holds your foot in a stretched, upward position while you sleep (night splint).  Physical therapy to relieve symptoms and prevent problems in the future.  Cortisone injections to relieve severe pain.  Extracorporeal shock wave therapy (ESWT) to stimulate damaged plantar fascia with electrical impulses. It is often used as a last resort before surgery.  Surgery, if other treatments have not worked after 12 months.  Follow these instructions at home:  Take medicines only as directed by your health care provider.  Avoid  activities that cause pain.  Roll the bottom of your foot over a bag of ice or a bottle of cold water. Do this for 20 minutes, 3-4 times a day.  Perform simple stretches as directed by your health care provider.  Try wearing athletic shoes with air-sole or gel-sole cushions or soft shoe inserts.  Wear a night splint while sleeping, if directed by your health care provider.  Keep all follow-up appointments with your health care provider. How is this prevented?  Do not perform exercises or activities that cause heel pain.  Consider finding low-impact activities if you continue to have problems.  Lose weight if you need to. The best way to prevent plantar fasciitis is to avoid the activities that aggravate your plantar fascia. Contact a health care provider if:  Your symptoms do not go away after treatment with home care measures.  Your pain gets worse.  Your pain affects your ability to move or do your daily activities. This information is not intended to replace advice given to you by your health care provider. Make sure you discuss any questions you have with your  health care provider. Document Released: 01/12/2001 Document Revised: 09/22/2015 Document Reviewed: 02/27/2014 Elsevier Interactive Patient Education  [employer].

## 2016-11-06 LAB — CBC WITH DIFFERENTIAL
BASOS: 0 %
Basophils Absolute: 0 10*3/uL (ref 0.0–0.2)
EOS (ABSOLUTE): 0.1 10*3/uL (ref 0.0–0.4)
EOS: 1 %
HEMATOCRIT: 46 % (ref 37.5–51.0)
HEMOGLOBIN: 15.5 g/dL (ref 13.0–17.7)
Immature Grans (Abs): 0 10*3/uL (ref 0.0–0.1)
Immature Granulocytes: 0 %
LYMPHS ABS: 2.2 10*3/uL (ref 0.7–3.1)
Lymphs: 49 %
MCH: 25.8 pg — AB (ref 26.6–33.0)
MCHC: 33.7 g/dL (ref 31.5–35.7)
MCV: 77 fL — ABNORMAL LOW (ref 79–97)
MONOCYTES: 9 %
Monocytes Absolute: 0.4 10*3/uL (ref 0.1–0.9)
NEUTROS ABS: 1.9 10*3/uL (ref 1.4–7.0)
Neutrophils: 41 %
RBC: 6.01 x10E6/uL — ABNORMAL HIGH (ref 4.14–5.80)
RDW: 14.6 % (ref 12.3–15.4)
WBC: 4.6 10*3/uL (ref 3.4–10.8)

## 2016-11-06 LAB — BASIC METABOLIC PANEL
BUN / CREAT RATIO: 13 (ref 9–20)
BUN: 13 mg/dL (ref 6–24)
CO2: 23 mmol/L (ref 20–29)
CREATININE: 1.04 mg/dL (ref 0.76–1.27)
Calcium: 9.3 mg/dL (ref 8.7–10.2)
Chloride: 100 mmol/L (ref 96–106)
GFR calc Af Amer: 98 mL/min/{1.73_m2} (ref >59)
GFR calc non Af Amer: 85 mL/min/{1.73_m2} (ref >59)
GLUCOSE: 171 mg/dL — AB (ref 65–99)
Potassium: 4.5 mmol/L (ref 3.5–5.2)
Sodium: 137 mmol/L (ref 134–144)

## 2016-11-06 LAB — LIPID PANEL
CHOLESTEROL TOTAL: 100 mg/dL (ref 100–199)
Chol/HDL Ratio: 2.9 ratio (ref 0.0–5.0)
HDL: 35 mg/dL — ABNORMAL LOW (ref >39)
LDL Calculated: 52 mg/dL (ref 0–99)
TRIGLYCERIDES: 64 mg/dL (ref 0–149)
VLDL Cholesterol Cal: 13 mg/dL (ref 5–40)

## 2016-11-06 LAB — HIV ANTIBODY (ROUTINE TESTING W REFLEX): HIV SCREEN 4TH GENERATION: NONREACTIVE

## 2016-11-06 LAB — D-DIMER, QUANTITATIVE: D-DIMER: 0.37 mg/L FEU (ref 0.00–0.49)

## 2016-11-07 DIAGNOSIS — I5032 Chronic diastolic (congestive) heart failure: Secondary | ICD-10-CM | POA: Insufficient documentation

## 2016-11-07 DIAGNOSIS — R079 Chest pain, unspecified: Secondary | ICD-10-CM | POA: Insufficient documentation

## 2016-11-07 NOTE — Progress Notes (Signed)
Cardiology Office Note    Date:  11/08/2016   ID:  Scott, [name] [birth year], MRN [id number]  PCP:  Scott Bark, FNP  Cardiologist: Scott Noe, Scott [name]   Chief Complaint  Patient presents with  . Chest Pain    History of Present Illness:  Scott [name] is a 46 y.o. male referred by Arrie Senate, Scott [name] for evaluation of essential hypertension and chest pain.  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 In speaking with him the discomfort is of several varieties.  The initial episode occurred several weeks ago when he was stretching and then suddenly developed tightness in the chest. The discomfort lasted approximately a minute before resolving. He's had this on one of the medication under similar circumstances. He was not exertion related and resolved quickly after a minute or so.  He also describes recurring episodes of fleeting parasternal discomfort that he calls "indigestion". He gets his attention but comes and goes so quickly that it has not been overly concerning to him.  Finally he has an occasional sensation of burning discomfort, nonexertional, and not associated with dyspnea.  There is no history of exertional dyspnea. 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 A repeat scan done in 2017 did not demonstrate evidence of emboli. Neither study demonstrated any coronary calcifications.  Past Medical History:  Diagnosis Date  . Carpal tunnel syndrome   . Diabetes mellitus   . DVT (deep venous thrombosis) (HCC)   . Hypertension   . Obesity   . Shoulder pain, bilateral   . Sleep apnea    was fitted for mask and never got machine - too costly    Past Surgical History:  Procedure Laterality Date  . CARPAL TUNNEL RELEASE Right 12/04/2015   Procedure: RIGHT CARPAL TUNNEL RELEASE;  Surgeon: Betha Loa, Scott [name];  Location: [facility];  Service:  Orthopedics;  Laterality: Right;  RIGHT CARPAL TUNNEL RELEASE  . CARPAL TUNNEL RELEASE Right 12/2015   [facility]  . CARPAL TUNNEL RELEASE Left 04/01/2016   Procedure: LEFT CARPAL TUNNEL RELEASE;  Surgeon: Betha Loa, Scott [name];  Location: [facility];  Service: Orthopedics;  Laterality: Left;  . right hand surgery    . ROTATOR CUFF REPAIR      Current Medications: Outpatient Medications Prior to Visit  Medication Sig Dispense Refill  . amLODipine (NORVASC) 5 MG tablet Take 1 tablet (5 mg total) by mouth daily. 90 tablet 3  . aspirin EC 81 MG tablet Take 1 tablet (81 mg total) by mouth daily. 90 tablet 3  . atorvastatin (LIPITOR) 20 MG tablet Take 1 tablet (20 mg total) by mouth daily. 90 tablet 3  . glucose blood (ONE TOUCH ULTRA TEST) test strip Use as instructed 100 each 12  . ibuprofen (ADVIL,MOTRIN) 600 MG tablet Take 1 tablet (600 mg total) by mouth every 8 (eight) hours as needed (Take with food.). 30 tablet 1  . Linagliptin-Metformin HCl (JENTADUETO) 2.08-998 MG TABS Take 1 tablet by mouth 2 (two) times daily with a meal. 180 tablet 3  . lisinopril (PRINIVIL,ZESTRIL) 20 MG tablet Take 1 tablet (20 mg total) by mouth daily. 90 tablet 3  . fexofenadine (ALLEGRA ALLERGY) 180 MG tablet Take 1 tablet (180 mg total) by mouth daily. (Patient not taking: Reported on 11/08/2016) 30 tablet 6  . fluticasone (FLONASE) 50 MCG/ACT nasal spray Place 2 sprays into both nostrils daily. (Patient  not taking: Reported on 11/08/2016) 16 g 6  . ketotifen (ZADITOR) 0.025 % ophthalmic solution Place 1 drop into both eyes 2 (two) times daily. (Patient not taking: Reported on 11/08/2016) 5 mL 0  . Olopatadine HCl (PATADAY) 0.2 % SOLN Apply 1 drop to eye daily. (Patient not taking: Reported on 11/08/2016) 2.5 Bottle 1  . omeprazole (PRILOSEC) 20 MG capsule Take 1 capsule (20 mg total) by mouth daily. (Patient not taking: Reported on 11/08/2016) 90 capsule 3   No facility-administered medications prior to visit.       Allergies:   Patient has no known allergies.   Social History   Social History  . Marital status: Married    Spouse name: N/A  . Number of children: N/A  . Years of education: 12+   Occupational History  . real estate agent    Social History Main Topics  . Smoking status: Never Smoker  . Smokeless tobacco: Never Used  . Alcohol use No  . Drug use: No  . Sexual activity: Not Asked   Other Topics Concern  . None   Social History Narrative   Lives at home.   Right-handed.   Occasional caffeine use.     Family History:  The patient's family history includes Diabetes in his other; Hyperlipidemia in his other; Hypertension in his mother and other; Kidney disease in his father.   ROS:   Please see the history of present illness.    Has some difficulty sleeping. Snores. No recurrence of DVT or PE since 2013. Chronic recurrent back discomfort. Otherwise unremarkable other than obesity.  All other systems reviewed and are negative.   PHYSICAL EXAM:   VS:  BP (!) 136/92 (BP Location: Left Arm)   Pulse (!) 107   Ht 5\' 6"  (1.676 m)   Wt 240 lb 12.8 oz (109.2 kg)   BMI 38.87 kg/m    GEN: Well nourished, well developed, in no acute distress . Moderate obesity. HEENT: normal  Neck: no JVD, carotid bruits, or masses Cardiac: RRR; no murmurs, rubs, or gallops,no edema  Respiratory:  clear to auscultation bilaterally, normal work of breathing GI: soft, nontender, nondistended, + BS MS: no deformity or atrophy  Skin: warm and dry, no rash Neuro:  Alert and Oriented x 3, Strength and sensation are intact Psych: euthymic mood, full affect  Wt Readings from Last 3 Encounters:  11/08/16 240 lb 12.8 oz (109.2 kg)  11/05/16 241 lb 3.2 oz (109.4 kg)  09/13/16 232 lb 6.4 oz (105.4 kg)      Studies/Labs Reviewed:   EKG:  EKG  Reveals normal sinus rhythm with overall normal appearance with performed 11/05/2016.  Recent Labs: 09/13/2016: ALT 50; Platelets 195 11/05/2016: BUN 13;  Creatinine, Ser 1.04; Hemoglobin 15.5; Potassium 4.5; Sodium 137   Lipid Panel    Component Value Date/Time   CHOL 100 11/05/2016 1042   TRIG 64 11/05/2016 1042   HDL 35 (L) 11/05/2016 1042   CHOLHDL 2.9 11/05/2016 1042   CHOLHDL 4.3 05/28/2016 0929   VLDL 22 05/28/2016 0929   LDLCALC 52 11/05/2016 1042    Additional studies/ records that were reviewed today include:  Echocardiography 08/03/2016: Study Conclusions  - Left ventricle: The cavity size was normal. There was mild   concentric hypertrophy. Systolic function was normal. The   estimated ejection fraction was in the range of 60% to 65%. Wall   motion was normal; there were no regional wall motion   abnormalities. Left ventricular diastolic function  parameters   were normal. - Aortic valve: Trileaflet; normal thickness leaflets.   Transvalvular velocity was within the normal range. There was no   stenosis. There was no regurgitation. - Aortic root: The aortic root was normal in size. - Mitral valve: There was no regurgitation. - Right ventricle: The cavity size was normal. Wall thickness was   normal. Systolic function was normal. - Tricuspid valve: There was trivial regurgitation. - Pulmonic valve: There was no regurgitation. - Pulmonary arteries: The main pulmonary artery was normal-sized.   Systolic pressure was within the normal range. - Inferior vena cava: The vessel was normal in size. - Pericardium, extracardiac: There was no pericardial effusion.   ASSESSMENT:    1. Chest discomfort   2. Type 2 diabetes mellitus with hyperosmolarity without coma, without long-term current use of insulin (HCC)   3. HTN (hypertension), benign   4. Chronic diastolic heart failure (HCC)   5. Other pulmonary embolism without acute cor pulmonale, unspecified chronicity (HCC)   6. Exertional chest pain      PLAN:  In order of problems listed above:  1. The chest discomfort has atypical features but given his greater than 20  year history of diabetes, obesity, hypertension, and hyperlipidemia, a stress Myoview will be performed to exclude ischemia as the etiology of his complaints. 2. Hemoglobin A1c was 7.6. Greater than 20 year history. Discussed weight reduction and aerobic activity. 3. Target blood pressure 130/90 mmHg or less. 4. No evidence of volume overload. Diastolic dysfunction demonstrated on echocardiography done earlier that she. 5. No evidence of right heart strain or pulmonary hypertension on recent echocardiogram.  We will plan to perform stress Myoview to exclude coronary artery disease. If low risk, aggressive risk factor modification, aerobic exercise, weight loss, will be recommended.  Medication Adjustments/Labs and Tests Ordered: Current medicines are reviewed at length with the patient today.  Concerns regarding medicines are outlined above.  Medication changes, Labs and Tests ordered today are listed in the Patient Instructions below. Patient Instructions  Medication Instructions:  None  Labwork: None  Testing/Procedures: Your physician has requested that you have en exercise stress myoview. For further information please visit [email or web address]. Please follow instruction sheet, as given.   Follow-Up: Your physician recommends that you schedule a follow-up appointment as needed with Dr. Katrinka BlazingSmith.    Any Other Special Instructions Will Be Listed Below (If Applicable).     If you need a refill on your cardiac medications before your next appointment, please call your pharmacy.      Signed, Scott NoeHenry W Smith III, Scott [name]  11/08/2016 3:54 PM    [facility] [address], [place], [place]  [address] Phone: [phone]; Fax: [phone]

## 2016-11-08 ENCOUNTER — Ambulatory Visit (INDEPENDENT_AMBULATORY_CARE_PROVIDER_SITE_OTHER): Payer: 59 | Admitting: Interventional Cardiology

## 2016-11-08 ENCOUNTER — Encounter: Payer: Self-pay | Admitting: Interventional Cardiology

## 2016-11-08 VITALS — BP 136/92 | HR 107 | Ht 66.0 in | Wt 240.8 lb

## 2016-11-08 DIAGNOSIS — I10 Essential (primary) hypertension: Secondary | ICD-10-CM | POA: Diagnosis not present

## 2016-11-08 DIAGNOSIS — R0789 Other chest pain: Secondary | ICD-10-CM

## 2016-11-08 DIAGNOSIS — I5032 Chronic diastolic (congestive) heart failure: Secondary | ICD-10-CM | POA: Diagnosis not present

## 2016-11-08 DIAGNOSIS — I2699 Other pulmonary embolism without acute cor pulmonale: Secondary | ICD-10-CM

## 2016-11-08 DIAGNOSIS — R079 Chest pain, unspecified: Secondary | ICD-10-CM

## 2016-11-08 DIAGNOSIS — E1100 Type 2 diabetes mellitus with hyperosmolarity without nonketotic hyperglycemic-hyperosmolar coma (NKHHC): Secondary | ICD-10-CM | POA: Diagnosis not present

## 2016-11-08 NOTE — Patient Instructions (Signed)
Medication Instructions:  None  Labwork: None  Testing/Procedures: Your physician has requested that you have en exercise stress myoview. For further information please visit [email or web address]. Please follow instruction sheet, as given.   Follow-Up: Your physician recommends that you schedule a follow-up appointment as needed with Dr. Smith.    Any Other Special Instructions Will Be Listed Below (If Applicable).     If you need a refill on your cardiac medications before your next appointment, please call your pharmacy.   

## 2016-11-10 ENCOUNTER — Other Ambulatory Visit: Payer: Self-pay | Admitting: Family Medicine

## 2016-11-11 ENCOUNTER — Telehealth (HOSPITAL_COMMUNITY): Payer: Self-pay | Admitting: *Deleted

## 2016-11-11 ENCOUNTER — Telehealth: Payer: Self-pay

## 2016-11-11 NOTE — Telephone Encounter (Signed)
-----   Message from Lizbeth BarkMandesia R Hairston, FNP sent at 11/10/2016  2:28 PM EDT ----- HIV negative Cholesterol levels have improved. Continue atorvastatin. Eat a diet low in saturated fat. Limit your intake of fried foods, red meats, and whole milk. Labs that evaluates for blood clot is negative.

## 2016-11-11 NOTE — Telephone Encounter (Signed)
CMA call regarding lab results   Patient Verify DOB   Patient was aware and understood  

## 2016-11-11 NOTE — Telephone Encounter (Signed)
Called patient regarding upcoming appointment. Patient stated that he would call back to get instructions.  Scott [name], Scott [name]

## 2016-11-12 ENCOUNTER — Ambulatory Visit (INDEPENDENT_AMBULATORY_CARE_PROVIDER_SITE_OTHER): Payer: 59 | Admitting: Family Medicine

## 2016-11-12 ENCOUNTER — Encounter: Payer: Self-pay | Admitting: Family Medicine

## 2016-11-12 VITALS — BP 146/95 | HR 75 | Ht 66.0 in | Wt 240.0 lb

## 2016-11-12 DIAGNOSIS — M722 Plantar fascial fibromatosis: Secondary | ICD-10-CM

## 2016-11-12 MED ORDER — METHYLPREDNISOLONE ACETATE 40 MG/ML IJ SUSP
40.0000 mg | Freq: Once | INTRAMUSCULAR | Status: AC
  Administered 2016-11-12: 40 mg via INTRA_ARTICULAR

## 2016-11-12 NOTE — Progress Notes (Signed)
PCP: Lizbeth BarkHairston, Mandesia R, FNP  Subjective:   HPI: Patient is a 46 y.o. male here for left heel pain.  1/24: Patient reports for just over a month he's had plantar left heel pain. Pain 7/10 at rest, up to 10/10 with pressure on foot. Worse when going from sitting to standing, sharp. Doing some stretching and using arch binder without much benefit. Not taking any medicine for this. No skin changes, numbness.  7/13: Patient reports his arch pain started to come back about 1 1/2 months ago. Pain level is 4/10 back at plantar heel but up to 9/10 and sharp at worst with a lot of walking. Doing home stretches, exercises, inserts all which help some. Arch binders worsened pain so not using. No skin changes, numbness.  Past Medical History:  Diagnosis Date  . Carpal tunnel syndrome   . Diabetes mellitus   . DVT (deep venous thrombosis) (HCC)   . Hypertension   . Obesity   . Shoulder pain, bilateral   . Sleep apnea    was fitted for mask and never got machine - too costly    Current Outpatient Prescriptions on File Prior to Visit  Medication Sig Dispense Refill  . amLODipine (NORVASC) 5 MG tablet Take 1 tablet (5 mg total) by mouth daily. 90 tablet 3  . aspirin EC 81 MG tablet Take 1 tablet (81 mg total) by mouth daily. 90 tablet 3  . atorvastatin (LIPITOR) 20 MG tablet Take 1 tablet (20 mg total) by mouth daily. 90 tablet 3  . fexofenadine (ALLEGRA) 180 MG tablet Take 180 mg by mouth daily as needed for allergies or rhinitis.    . fluticasone (FLONASE) 50 MCG/ACT nasal spray Place 2 sprays into both nostrils daily as needed for allergies or rhinitis.    [name]. glucose blood (ONE TOUCH ULTRA TEST) test strip Use as instructed 100 each 12  . ibuprofen (ADVIL,MOTRIN) 600 MG tablet Take 1 tablet (600 mg total) by mouth every 8 (eight) hours as needed (Take with food.). 30 tablet 1  . INVOKANA 300 MG TABS tablet Take 300 mg by mouth daily.    . Linagliptin-Metformin HCl (JENTADUETO)  2.08-998 MG TABS Take 1 tablet by mouth 2 (two) times daily with a meal. 180 tablet 3  . lisinopril (PRINIVIL,ZESTRIL) 20 MG tablet Take 1 tablet (20 mg total) by mouth daily. 90 tablet 3  . omeprazole (PRILOSEC) 20 MG capsule Take 20 mg by mouth daily as needed (heartburn).     No current facility-administered medications on file prior to visit.     Past Surgical History:  Procedure Laterality Date  . CARPAL TUNNEL RELEASE Right 12/04/2015   Procedure: RIGHT CARPAL TUNNEL RELEASE;  Surgeon: Betha LoaKevin Kuzma, MD;  Location: [facility];  Service: Orthopedics;  Laterality: Right;  RIGHT CARPAL TUNNEL RELEASE  . CARPAL TUNNEL RELEASE Right 12/2015   [facility]  . CARPAL TUNNEL RELEASE Left 04/01/2016   Procedure: LEFT CARPAL TUNNEL RELEASE;  Surgeon: Betha LoaKevin Kuzma, MD;  Location: [facility];  Service: Orthopedics;  Laterality: Left;  . right hand surgery    . ROTATOR CUFF REPAIR      No Known Allergies  Social History   Social History  . Marital status: Married    Spouse name: N/A  . Number of children: N/A  . Years of education: 12+   Occupational History  . real estate agent    Social History Main Topics  . Smoking status: Never Smoker  . Smokeless  tobacco: Never Used  . Alcohol use No  . Drug use: No  . Sexual activity: Not on file   Other Topics Concern  . Not on file   Social History Narrative   Lives at home.   Right-handed.   Occasional caffeine use.    Family History  Problem Relation Age of Onset  . Hypertension Mother   . Kidney disease Father   . Hypertension Other   . Hyperlipidemia Other   . Diabetes Other     BP (!) 146/95   Pulse 75   Ht 5\' 6"  (1.676 m)   Wt 240 lb (108.9 kg)   BMI 38.74 kg/m   Review of Systems: See HPI above.     Objective:  Physical Exam:  Gen: NAD, comfortable in exam room  Left foot/ankle: Mod pronation.  No gross deformity, swelling, ecchymoses FROM TTP plantar fascia at medial insertion on  calcaneus.  No other tenderness. Negative ant drawer and talar tilt.   Negative calcaneal squeeze. Thompsons test negative. NV intact distally.  Right foot/ankle: FROM without pain.   Assessment & Plan:  1. Left plantar fasciitis - Again reviewed home exercises and stretches to do daily.  Stop arch binders - pain worsened with these.  Continue arch supports.  Injection repeated today.  Tylenol or aleve if needed.  Icing as needed.  Consider physical therapy if not improving.  f/u in 1 month.  After informed written consent patient was seated on exam table.  Area overlying left medial plantar fascia prepped with alcohol swab then using ultrasound guidance with multiple needle fenestrations patient's left plantar fascia was injected with 2:1 bupivicaine: depomedrol.  Patient tolerated procedure well without immediate complications.

## 2016-11-12 NOTE — Patient Instructions (Signed)
You have plantar fasciitis Take tylenol or aleve as needed for pain  Plantar fascia stretch for 20-30 seconds (do 3 of these) in morning Lowering/raise on a step exercises 3 x 10 once or twice a day - this is very important for long term recovery. Can add heel walks, toe walks forward and backward as well Ice heel for 15 minutes as needed. Avoid flat shoes/barefoot walking as much as possible. Arch straps have been shown to help with pain. Heel lifts may help with pain by avoiding fully stretching the plantar fascia except when doing home exercises. Orthotics are helpful (OTC dr scholls active series, spencos or our custom ones). Steroid injection is a consideration for short term pain relief if you are struggling - you were given this today. Physical therapy is also an option. Follow up with me in 1 month.

## 2016-11-12 NOTE — Assessment & Plan Note (Signed)
Again reviewed home exercises and stretches to do daily.  Stop arch binders - pain worsened with these.  Continue arch supports.  Injection repeated today.  Tylenol or aleve if needed.  Icing as needed.  Consider physical therapy if not improving.  f/u in 1 month.  After informed written consent patient was seated on exam table.  Area overlying left medial plantar fascia prepped with alcohol swab then using ultrasound guidance with multiple needle fenestrations patient's left plantar fascia was injected with 2:1 bupivicaine: depomedrol.  Patient tolerated procedure well without immediate complications.

## 2016-11-15 ENCOUNTER — Ambulatory Visit (HOSPITAL_COMMUNITY): Payer: 59 | Attending: Cardiovascular Disease

## 2016-11-15 DIAGNOSIS — E119 Type 2 diabetes mellitus without complications: Secondary | ICD-10-CM | POA: Diagnosis not present

## 2016-11-15 DIAGNOSIS — R079 Chest pain, unspecified: Secondary | ICD-10-CM | POA: Insufficient documentation

## 2016-11-15 DIAGNOSIS — I517 Cardiomegaly: Secondary | ICD-10-CM | POA: Diagnosis not present

## 2016-11-15 DIAGNOSIS — I2510 Atherosclerotic heart disease of native coronary artery without angina pectoris: Secondary | ICD-10-CM | POA: Diagnosis present

## 2016-11-15 DIAGNOSIS — R002 Palpitations: Secondary | ICD-10-CM | POA: Diagnosis not present

## 2016-11-15 DIAGNOSIS — I10 Essential (primary) hypertension: Secondary | ICD-10-CM | POA: Insufficient documentation

## 2016-11-15 LAB — MYOCARDIAL PERFUSION IMAGING
CHL CUP NUCLEAR SRS: 2
CHL CUP NUCLEAR SSS: 6
CSEPED: 8 min
CSEPEW: 10.1 METS
Exercise duration (sec): 14 s
LHR: 0.34
LV dias vol: 109 mL (ref 62–150)
LV sys vol: 46 mL
MPHR: 173 {beats}/min
NUC STRESS TID: 0.79
Peak HR: 171 {beats}/min
Percent HR: 98 %
RPE: 17
Rest HR: 74 {beats}/min
SDS: 4

## 2016-11-15 MED ORDER — TECHNETIUM TC 99M TETROFOSMIN IV KIT
10.7000 | PACK | Freq: Once | INTRAVENOUS | Status: AC | PRN
  Administered 2016-11-15: 10.7 via INTRAVENOUS
  Filled 2016-11-15: qty 11

## 2016-11-15 MED ORDER — TECHNETIUM TC 99M TETROFOSMIN IV KIT
32.4000 | PACK | Freq: Once | INTRAVENOUS | Status: AC | PRN
  Administered 2016-11-15: 32.4 via INTRAVENOUS
  Filled 2016-11-15: qty 33

## 2016-11-18 ENCOUNTER — Ambulatory Visit: Payer: 59 | Admitting: Pharmacist

## 2016-11-18 NOTE — Progress Notes (Deleted)
    S:     No chief complaint on file.   Patient arrives ***.  Presents for diabetes evaluation, education, and management [facility] the [name] Jegede. Patient was referred on 11/05/16.  Patient was last seen by Primary Care Provider on 11/05/16.   Patient {Actions; denies-reports:120008} adherence with medications.  Current diabetes medications include: Jentadueto 2.08-998 mg BID, Invokana 300 mg daily  Patient {Actions; denies-reports:120008} hypoglycemic events.  Patient reported dietary habits: Eats *** meals/day Breakfast:*** Lunch:*** Dinner:*** Snacks:*** Drinks:***  Patient reported exercise habits:    Patient {Actions; denies-reports:120008} nocturia.  Patient {Actions; denies-reports:120008} neuropathy. Patient {Actions; denies-reports:120008} visual changes. Patient {Actions; denies-reports:120008} self foot exams.    O:  Physical Exam   ROS   Lab Results  Component Value Date   HGBA1C 7.6 11/05/2016   There were no vitals filed for this visit.  Home fasting CBG: ***  2 hour post-prandial/random CBG: ***.  10 year ASCVD risk: ***.  A/P: Diabetes longstanding/newly diagnosed currently ***. Patient {Actions; denies-reports:120008} hypoglycemic events and is able to verbalize appropriate hypoglycemia management plan. Patient {Actions; denies-reports:120008} adherence with medication. Control is suboptimal due to ***.  Continue Invokana 300 mg daily.   Next A1C anticipated October 2018.   Written patient instructions provided.  Total time in face to face counseling *** minutes.   Follow up in Pharmacist Clinic Visit ***.

## 2016-11-23 ENCOUNTER — Other Ambulatory Visit: Payer: Self-pay | Admitting: Family Medicine

## 2016-11-23 DIAGNOSIS — I10 Essential (primary) hypertension: Secondary | ICD-10-CM

## 2016-11-23 DIAGNOSIS — E1141 Type 2 diabetes mellitus with diabetic mononeuropathy: Secondary | ICD-10-CM

## 2016-11-24 ENCOUNTER — Ambulatory Visit: Payer: 59 | Admitting: Pharmacist

## 2017-01-12 ENCOUNTER — Ambulatory Visit: Payer: 59 | Attending: Family Medicine | Admitting: Family Medicine

## 2017-01-12 ENCOUNTER — Encounter: Payer: Self-pay | Admitting: Family Medicine

## 2017-01-12 VITALS — BP 136/84 | HR 73 | Temp 98.5°F | Resp 18 | Ht 66.0 in | Wt 240.2 lb

## 2017-01-12 DIAGNOSIS — N50819 Testicular pain, unspecified: Secondary | ICD-10-CM | POA: Diagnosis not present

## 2017-01-12 DIAGNOSIS — Z7982 Long term (current) use of aspirin: Secondary | ICD-10-CM | POA: Insufficient documentation

## 2017-01-12 DIAGNOSIS — R3129 Other microscopic hematuria: Secondary | ICD-10-CM

## 2017-01-12 DIAGNOSIS — M5126 Other intervertebral disc displacement, lumbar region: Secondary | ICD-10-CM | POA: Insufficient documentation

## 2017-01-12 DIAGNOSIS — N5089 Other specified disorders of the male genital organs: Secondary | ICD-10-CM

## 2017-01-12 DIAGNOSIS — E1100 Type 2 diabetes mellitus with hyperosmolarity without nonketotic hyperglycemic-hyperosmolar coma (NKHHC): Secondary | ICD-10-CM

## 2017-01-12 DIAGNOSIS — N509 Disorder of male genital organs, unspecified: Secondary | ICD-10-CM | POA: Diagnosis not present

## 2017-01-12 DIAGNOSIS — I10 Essential (primary) hypertension: Secondary | ICD-10-CM

## 2017-01-12 DIAGNOSIS — G8929 Other chronic pain: Secondary | ICD-10-CM

## 2017-01-12 DIAGNOSIS — M545 Low back pain: Secondary | ICD-10-CM

## 2017-01-12 DIAGNOSIS — Z0000 Encounter for general adult medical examination without abnormal findings: Secondary | ICD-10-CM | POA: Diagnosis not present

## 2017-01-12 DIAGNOSIS — E119 Type 2 diabetes mellitus without complications: Secondary | ICD-10-CM | POA: Insufficient documentation

## 2017-01-12 DIAGNOSIS — Z8739 Personal history of other diseases of the musculoskeletal system and connective tissue: Secondary | ICD-10-CM

## 2017-01-12 LAB — POCT URINALYSIS DIPSTICK
Bilirubin, UA: NEGATIVE
Glucose, UA: NEGATIVE
Ketones, UA: NEGATIVE
LEUKOCYTES UA: NEGATIVE
Nitrite, UA: NEGATIVE
PH UA: 5.5 (ref 5.0–8.0)
PROTEIN UA: 30
SPEC GRAV UA: 1.025 (ref 1.010–1.025)
UROBILINOGEN UA: 0.2 U/dL

## 2017-01-12 LAB — GLUCOSE, POCT (MANUAL RESULT ENTRY): POC GLUCOSE: 129 mg/dL — AB (ref 70–99)

## 2017-01-12 MED ORDER — AMLODIPINE BESYLATE 10 MG PO TABS: 10.0000 mg | ORAL_TABLET | Freq: Every day | ORAL | 3 refills | Status: DC

## 2017-01-12 MED ORDER — NAPROXEN 500 MG PO TABS: ORAL_TABLET | ORAL | 0 refills | Status: DC

## 2017-01-12 MED ORDER — PNEUMOCOCCAL 13-VAL CONJ VACC IM SUSP
0.5000 mL | INTRAMUSCULAR | Status: AC
  Administered 2017-01-12: 0.5 mL via INTRAMUSCULAR

## 2017-01-12 NOTE — Progress Notes (Signed)
Subjective:  Patient ID: Scott [name], male    DOB: 03/16/1971  Age: 46 y.o. MRN: [id number]  CC: Hypertension and Diabetes   HPI Scott [name] presents for testicular complaint of pain, swelling, and "knot-like" mass. He reports similar symptoms in the past, requiring ED visit , urologist referral, with course of antibiotics. He denies any pain, penile discharge, dysuria, or changes to bowel or bladder habits. Reports symptoms started 4 to 5 months ago. He does complains of chronic low back pain.The patient first noted symptoms severalyears ago. It was not related to no known injury. The pain is rated 8/10, and is located at the bilateral lumbar area. The pain is described as aching. He symptoms reports been progressive. Symptoms are exacerbated by flexion, lying down and sitting. Factors which relieve the pain include nothing. He denies any  associated symptoms. Previous history of symptoms: the problem is long-standing. Previous workup includes MRI in 2014  which showed small disc herniation at the L4-L5. Treatment efforts have included OTC NSAIDS and prescription NSAID'S, without adequate  Relief. History of DM. Patient denies foot ulcerations, nausea, paresthesia of the feet, polydipsia, polyuria, visual disturbances and vomiting.  Evaluation to date has been included: fasting blood sugar, fasting lipid panel, hemoglobin A1C and microalbuminuria.  Home sugars: BGs are running  consistent with Hgb A1C.CBG 2 hours after meals 230-240's  Treatment to date: Juodueto.     Outpatient Medications Prior to Visit  Medication Sig Dispense Refill  . aspirin EC 81 MG tablet Take 1 tablet (81 mg total) by mouth daily. 90 tablet 3  . atorvastatin (LIPITOR) 20 MG tablet Take 1 tablet (20 mg total) by mouth daily. 90 tablet 3  . fexofenadine (ALLEGRA) 180 MG tablet Take 180 mg by mouth daily as needed for allergies or rhinitis.    . fluticasone (FLONASE) 50 MCG/ACT nasal spray Place 2 sprays into  both nostrils daily as needed for allergies or rhinitis.    [name]. glucose blood (ONE TOUCH ULTRA TEST) test strip Use as instructed 100 each 12  . ibuprofen (ADVIL,MOTRIN) 600 MG tablet Take 1 tablet (600 mg total) by mouth every 8 (eight) hours as needed (Take with food.). 30 tablet 1  . INVOKANA 300 MG TABS tablet Take 300 mg by mouth daily.    . Linagliptin-Metformin HCl (JENTADUETO) 2.08-998 MG TABS Take 1 tablet by mouth 2 (two) times daily. 180 tablet 3  . lisinopril (PRINIVIL,ZESTRIL) 20 MG tablet Take 1 tablet (20 mg total) by mouth daily. 90 tablet 3  . omeprazole (PRILOSEC) 20 MG capsule Take 20 mg by mouth daily as needed (heartburn).    [name]. amLODipine (NORVASC) 5 MG tablet Take 1 tablet (5 mg total) by mouth daily. 90 tablet 3   No facility-administered medications prior to visit.     ROS Review of Systems  Constitutional: Negative.   Eyes: Negative.   Respiratory: Negative.   Cardiovascular: Negative.   Gastrointestinal: Negative.   Endocrine: Negative.   Genitourinary: Positive for scrotal swelling.  Musculoskeletal: Positive for back pain.  Skin: Negative.   Neurological: Negative.    Objective:  BP 136/84   Pulse 73   Temp 98.5 F (36.9 C) (Oral)   Resp 18   Ht 5\' 6"  (1.676 m)   Wt 240 lb 3.2 oz (109 kg)   SpO2 96%   BMI 38.77 kg/m   BP/Weight 01/12/2017 11/12/2016 11/08/2016  Systolic BP 136 146 136  Diastolic BP 84 95 92  Wt. (Lbs) 240.2  240 240.8  BMI 38.77 38.74 38.87   Physical Exam  Constitutional: He is oriented to person, place, and time. He appears well-developed and well-nourished.  HENT:  Head: Normocephalic and atraumatic.  Right Ear: External ear normal.  Left Ear: External ear normal.  Nose: Nose normal.  Mouth/Throat: Oropharynx is clear and moist.  Eyes: Pupils are equal, round, and reactive to light. Conjunctivae are normal.  Cardiovascular: Normal rate, regular rhythm, normal heart sounds, intact distal pulses and normal pulses.     Pulmonary/Chest: Effort normal and breath sounds normal.  Abdominal: Soft. Bowel sounds are normal.  Genitourinary:  Genitourinary Comments: Patient refused testicular examination  Musculoskeletal: Normal range of motion.       Lumbar back: He exhibits pain.  Neurological: He is alert and oriented to person, place, and time. He has normal reflexes. No sensory deficit.  Skin: Skin is warm and dry.  Psychiatric: His mood appears anxious.  Nursing note and vitals reviewed.   Assessment & Plan:   1. Type 2 diabetes mellitus without complication, without long-term current use of insulin (HCC) Last HgA1c 11/05/16 - Glucose (CBG)  2. Testicular lump Patient refused testicular examination from provider. - Ambulatory referral to Urology - pneumococcal 13-valent conjugate vaccine (PREVNAR 13) injection 0.5 mL; Inject 0.5 mLs into the muscle tomorrow at 10 am.  3. HTN (hypertension), benign  - amLODipine (NORVASC) 10 MG tablet; Take 1 tablet (10 mg total) by mouth daily.  Dispense: 90 tablet; Refill: 3  4. Chronic bilateral low back pain without sciatica  - naproxen (NAPROSYN) 500 MG tablet; TAKE ONE TABLET BY MOUTH TWICE A DAY  WITH MEALS FOR 10 DAYS. THEN TAKE ONE TABLET BY MOUTH ONCE A DAY WITH MEAL AS NEEDED.  Dispense: 60 tablet; Refill: 0 - DG Lumbar Spine Complete; Future - Ambulatory referral to Orthopedics  5. History of herniated intervertebral disc  - Ambulatory referral to Orthopedics  6. Healthcare maintenance -Pneumococcal 13 vaccine  7. Microscopic hematuria - Urinalysis Dipstick - Ambulatory referral to Urology   Meds ordered this encounter  Medications  . naproxen (NAPROSYN) 500 MG tablet    Sig: TAKE ONE TABLET BY MOUTH TWICE A DAY  WITH MEALS FOR 10 DAYS. THEN TAKE ONE TABLET BY MOUTH ONCE A DAY WITH MEAL AS NEEDED.    Dispense:  60 tablet    Refill:  0    Order Specific Question:   Supervising Provider    Answer:   Quentin Angst [id number]  .  pneumococcal 13-valent conjugate vaccine (PREVNAR 13) injection 0.5 mL  . amLODipine (NORVASC) 10 MG tablet    Sig: Take 1 tablet (10 mg total) by mouth daily.    Dispense:  90 tablet    Refill:  3    Must have office visit for refills.    Order Specific Question:   Supervising Provider    Answer:   Quentin Angst [[id number]]    Follow up: Return in about 8 weeks (around 03/09/2017) for DM .   Lizbeth Bark FNP

## 2017-01-12 NOTE — Progress Notes (Signed)
Patient is here for f/up  

## 2017-01-12 NOTE — Progress Notes (Deleted)
   Subjective:  Patient ID: Scott [name] Mcalpine, male    DOB: 10/30/1970  Age: 46 y.o. MRN: [id number]  CC: Hypertension and Diabetes   HPI Scott [name] Stouffer presents for   Urology  Swelling of testicle/ pain  Antibiotics 4 to 5 months ago  Testicular pain  Went to ED , urologist before  Left knot swelling  Urine  Diet Diabetic educaton   PT back  8/10 right sided   Dec. Reflex    Laying  Sitting  Bending   6 to 8     CBG 2 hours after meals 230-240's      Outpatient Medications Prior to Visit  Medication Sig Dispense Refill  . amLODipine (NORVASC) 5 MG tablet Take 1 tablet (5 mg total) by mouth daily. 90 tablet 3  . aspirin EC 81 MG tablet Take 1 tablet (81 mg total) by mouth daily. 90 tablet 3  . atorvastatin (LIPITOR) 20 MG tablet Take 1 tablet (20 mg total) by mouth daily. 90 tablet 3  . fexofenadine (ALLEGRA) 180 MG tablet Take 180 mg by mouth daily as needed for allergies or rhinitis.    . fluticasone (FLONASE) 50 MCG/ACT nasal spray Place 2 sprays into both nostrils daily as needed for allergies or rhinitis.    [name]. glucose blood (ONE TOUCH ULTRA TEST) test strip Use as instructed 100 each 12  . ibuprofen (ADVIL,MOTRIN) 600 MG tablet Take 1 tablet (600 mg total) by mouth every 8 (eight) hours as needed (Take with food.). 30 tablet 1  . INVOKANA 300 MG TABS tablet Take 300 mg by mouth daily.    . Linagliptin-Metformin HCl (JENTADUETO) 2.08-998 MG TABS Take 1 tablet by mouth 2 (two) times daily. 180 tablet 3  . lisinopril (PRINIVIL,ZESTRIL) 20 MG tablet Take 1 tablet (20 mg total) by mouth daily. 90 tablet 3  . omeprazole (PRILOSEC) 20 MG capsule Take 20 mg by mouth daily as needed (heartburn).     No facility-administered medications prior to visit.     ROS Review of Systems   Objective:  BP 136/84 (BP Location: Left Arm, Patient Position: Sitting, Cuff Size: Normal)   Pulse 73   Temp 98.5 F (36.9 C) (Oral)   Resp 18   Ht 5\' 6"  (1.676 m)   Wt 240  lb 3.2 oz (109 kg)   SpO2 96%   BMI 38.77 kg/m   BP/Weight 01/12/2017 11/12/2016 11/08/2016  Systolic BP 136 146 136  Diastolic BP 84 95 92  Wt. (Lbs) 240.2 240 240.8  BMI 38.77 38.74 38.87     Physical Exam   Assessment & Plan:   Problem List Items Addressed This Visit      Endocrine   DM type 2 (diabetes mellitus, type 2) (HCC) - Primary (Chronic)   Relevant Orders   Glucose (CBG) (Completed)    Other Visit Diagnoses    Needs flu shot       Relevant Orders   Flu Vaccine QUAD 6+ mos PF IM (Fluarix Quad PF)      No orders of the defined types were placed in this encounter.   Follow-up: No Follow-up on file.   Lizbeth BarkMandesia R Danila Eddie FNP

## 2017-01-27 ENCOUNTER — Encounter: Payer: Self-pay | Admitting: Family Medicine

## 2017-01-27 ENCOUNTER — Ambulatory Visit (INDEPENDENT_AMBULATORY_CARE_PROVIDER_SITE_OTHER): Payer: 59 | Admitting: Family Medicine

## 2017-01-27 DIAGNOSIS — G8929 Other chronic pain: Secondary | ICD-10-CM | POA: Diagnosis not present

## 2017-01-27 DIAGNOSIS — M545 Low back pain, unspecified: Secondary | ICD-10-CM

## 2017-01-27 NOTE — Patient Instructions (Signed)
Your pain is either due to pure lumbar and hip external rotator strains or lumbar radiculopathy going into these muscle groups. We will go ahead with an MRI to assess for disc bulge/herniation. Ok to take tylenol for baseline pain relief (1-2 extra strength tabs 3x/day) Consider prednisone dose pack, muscle relaxant, pain medication, physical therapy, injections based on results. Stay as active as possible. Strengthening of low back muscles, abdominal musculature are key for long term pain relief.

## 2017-01-30 DIAGNOSIS — M545 Low back pain, unspecified: Secondary | ICD-10-CM | POA: Insufficient documentation

## 2017-01-30 NOTE — Progress Notes (Addendum)
PCP: Lizbeth Bark, FNP  Subjective:   HPI: Patient is a 46 y.o. male here for low back pain.  Patient reports he's had about 2.5 months of right sided low back pain. No acute injury or trauma. Pain does not radiate. No numbness or tingling. Pain level up to 7/10 and sharp. Worse with standing, sitting, and bending. Tried naproxen and biofreeze.  Past Medical History:  Diagnosis Date  . Carpal tunnel syndrome   . Diabetes mellitus   . DVT (deep venous thrombosis) (HCC)   . Hypertension   . Obesity   . Shoulder pain, bilateral   . Sleep apnea    was fitted for mask and never got machine - too costly    Current Outpatient Prescriptions on File Prior to Visit  Medication Sig Dispense Refill  . amLODipine (NORVASC) 10 MG tablet Take 1 tablet (10 mg total) by mouth daily. 90 tablet 3  . aspirin EC 81 MG tablet Take 1 tablet (81 mg total) by mouth daily. 90 tablet 3  . atorvastatin (LIPITOR) 20 MG tablet Take 1 tablet (20 mg total) by mouth daily. 90 tablet 3  . fexofenadine (ALLEGRA) 180 MG tablet Take 180 mg by mouth daily as needed for allergies or rhinitis.    . fluticasone (FLONASE) 50 MCG/ACT nasal spray Place 2 sprays into both nostrils daily as needed for allergies or rhinitis.    [name] glucose blood (ONE TOUCH ULTRA TEST) test strip Use as instructed 100 each 12  . ibuprofen (ADVIL,MOTRIN) 600 MG tablet Take 1 tablet (600 mg total) by mouth every 8 (eight) hours as needed (Take with food.). 30 tablet 1  . INVOKANA 300 MG TABS tablet Take 300 mg by mouth daily.    . Linagliptin-Metformin HCl (JENTADUETO) 2.08-998 MG TABS Take 1 tablet by mouth 2 (two) times daily. 180 tablet 3  . lisinopril (PRINIVIL,ZESTRIL) 20 MG tablet Take 1 tablet (20 mg total) by mouth daily. 90 tablet 3  . naproxen (NAPROSYN) 500 MG tablet TAKE ONE TABLET BY MOUTH TWICE A DAY  WITH MEALS FOR 10 DAYS. THEN TAKE ONE TABLET BY MOUTH ONCE A DAY WITH MEAL AS NEEDED. 60 tablet 0  . omeprazole (PRILOSEC)  20 MG capsule Take 20 mg by mouth daily as needed (heartburn).     No current facility-administered medications on file prior to visit.     Past Surgical History:  Procedure Laterality Date  . CARPAL TUNNEL RELEASE Right 12/04/2015   Procedure: RIGHT CARPAL TUNNEL RELEASE;  Surgeon: Betha Loa, MD;  Location: [facility];  Service: Orthopedics;  Laterality: Right;  RIGHT CARPAL TUNNEL RELEASE  . CARPAL TUNNEL RELEASE Right 12/2015   [facility]  . CARPAL TUNNEL RELEASE Left 04/01/2016   Procedure: LEFT CARPAL TUNNEL RELEASE;  Surgeon: Betha Loa, MD;  Location: [facility];  Service: Orthopedics;  Laterality: Left;  . right hand surgery    . ROTATOR CUFF REPAIR      No Known Allergies  Social History   Social History  . Marital status: Married    Spouse name: N/A  . Number of children: N/A  . Years of education: 12+   Occupational History  . real estate agent    Social History Main Topics  . Smoking status: Never Smoker  . Smokeless tobacco: Never Used  . Alcohol use No  . Drug use: No  . Sexual activity: Not on file   Other Topics Concern  . Not on file   Social  History Narrative   Lives at home.   Right-handed.   Occasional caffeine use.    Family History  Problem Relation Age of Onset  . Hypertension Mother   . Kidney disease Father   . Hypertension Other   . Hyperlipidemia Other   . Diabetes Other     BP 131/84   Pulse 72   Ht  (1.676 m)   Wt 235 lb (106.6 kg)   BMI 37.93 kg/m   Review of Systems: See HPI above.     Objective:  Physical Exam:  Gen: NAD, comfortable in exam room  Back: No gross deformity, scoliosis. TTP right lumbar paraspinal muscles and over piriformis.  No midline or bony TTP. FROM. Strength LEs 5/5 all muscle groups.   2+ MSRs in patellar and achilles tendons, equal bilaterally. Negative SLRs. Sensation intact to light touch bilaterally. Negative logroll bilateral hips Negative fabers and  piriformis stretches.   Assessment & Plan:  1. Low back pain - ongoing for over 2 1/2 months.  Not improving with naproxen or biofreeze.  Discussed options - would like to go ahead with MRI of lumbar spine to further assess.  Consider prednisone, physical therapy, muscle relaxant, injections depending on results.  Shown home exercises.  Addendum:  MRI reviewed and discussed with patient.  Similar appearance to MRI from 2014.  No evidence new disc herniation or radiculopathy.  Consistent with strain.  Will start physical therapy and follow up with [place] in 5-6 weeks.

## 2017-01-30 NOTE — Assessment & Plan Note (Signed)
ongoing for over 2 1/2 months.  Not improving with naproxen or biofreeze.  Discussed options - would like to go ahead with MRI of lumbar spine to further assess.  Consider prednisone, physical therapy, muscle relaxant, injections depending on results.  Shown home exercises.

## 2017-01-31 MED FILL — AMLODIPINE BESYLATE 10 MG T: 10 | 30 days supply | Qty: 30 | Fill #0

## 2017-02-01 ENCOUNTER — Telehealth: Payer: Self-pay | Admitting: Family Medicine

## 2017-02-01 NOTE — Telephone Encounter (Signed)
Pt came tot the office to let you know to please check on the Urology result and to call him that way can talk about it, please follow up

## 2017-02-01 NOTE — Telephone Encounter (Signed)
Will refer to referral specialist.

## 2017-02-03 NOTE — Addendum Note (Signed)
Addended by: Kathi Simpers F on: 02/03/2017 11:30 AM   Modules accepted: Orders

## 2017-02-05 ENCOUNTER — Ambulatory Visit (HOSPITAL_BASED_OUTPATIENT_CLINIC_OR_DEPARTMENT_OTHER)
Admission: RE | Admit: 2017-02-05 | Discharge: 2017-02-05 | Disposition: A | Discharge status: Home / Self Care | Payer: 59 | Source: Ambulatory Visit | Attending: Family Medicine | Admitting: Family Medicine

## 2017-02-05 DIAGNOSIS — M545 Low back pain, unspecified: Secondary | ICD-10-CM

## 2017-02-05 DIAGNOSIS — M5126 Other intervertebral disc displacement, lumbar region: Secondary | ICD-10-CM | POA: Insufficient documentation

## 2017-02-05 DIAGNOSIS — G8929 Other chronic pain: Secondary | ICD-10-CM | POA: Diagnosis not present

## 2017-02-07 NOTE — Addendum Note (Signed)
Addended by: Kathi Simpers F on: 02/07/2017 04:57 PM   Modules accepted: Orders

## 2017-02-16 ENCOUNTER — Ambulatory Visit: Payer: 59 | Admitting: Physical Therapy

## 2017-02-24 ENCOUNTER — Ambulatory Visit: Payer: 59 | Attending: Family Medicine | Admitting: Physical Therapy

## 2017-03-02 ENCOUNTER — Ambulatory Visit: Payer: 59 | Admitting: Physical Therapy

## 2017-03-08 ENCOUNTER — Ambulatory Visit: Payer: 59 | Admitting: Physical Therapy

## 2017-03-09 ENCOUNTER — Ambulatory Visit: Payer: 59 | Admitting: Family Medicine

## 2017-03-10 NOTE — Telephone Encounter (Signed)
Sent Referral to [facility] today 03/10/17 ph. # [phone].[address].  They will contact the patient to schedule an appointment

## 2017-03-15 ENCOUNTER — Encounter: Payer: Self-pay | Admitting: Physical Therapy

## 2017-03-15 ENCOUNTER — Ambulatory Visit: Payer: 59 | Attending: Family Medicine | Admitting: Physical Therapy

## 2017-03-15 ENCOUNTER — Other Ambulatory Visit: Payer: Self-pay

## 2017-03-15 DIAGNOSIS — G8929 Other chronic pain: Secondary | ICD-10-CM

## 2017-03-15 DIAGNOSIS — R29898 Other symptoms and signs involving the musculoskeletal system: Secondary | ICD-10-CM | POA: Diagnosis present

## 2017-03-15 DIAGNOSIS — M545 Low back pain, unspecified: Secondary | ICD-10-CM

## 2017-03-15 NOTE — Patient Instructions (Signed)
Therapeutic - Prone Press-Up   Push up so chest is off floor and back is arched. Do not raise hips. Hold __5-10__ seconds. Return to prone position. Repeat __10-15__ times.  Hamstring Step 2   Left foot relaxed, knee straight, other leg bent, foot flat. Raise straight leg further upward to maximal range. Hold _30__ seconds. Relax leg completely down. Repeat __3_ times.  Bridge   Lie back, legs bent. Inhale, pressing hips up. Keeping ribs in, lengthen lower back. Exhale, rolling down along spine from top. Repeat __15__ times. Do __2__ sessions per day.  Knee to Chest (Flexion)   Pull knee toward chest. Feel stretch in lower back or buttock area. Breathing deeply, Hold __5-10__ seconds. Repeat with other knee. Repeat _5-10___ times.

## 2017-03-15 NOTE — Therapy (Addendum)
[facility] [address]  [place], [place], [address] Phone: [phone]   Fax:  [phone]  Physical Therapy Evaluation  Patient Details  Name: Scott [name] MRN: [id number] Date of Birth: 1970-12-17 Referring Provider: Dr. Karlton Lemon   Encounter Date: 03/15/2017  PT End of Session - 03/15/17 1158    Visit Number  1    Number of Visits  12    Date for PT Re-Evaluation  04/26/17    PT Start Time  1114    PT Stop Time  1157    PT Time Calculation (min)  43 min    Activity Tolerance  Patient tolerated treatment well    Behavior During Therapy  [facility] for tasks assessed/performed       Past Medical History:  Diagnosis Date  . Carpal tunnel syndrome   . Diabetes mellitus   . DVT (deep venous thrombosis) ([place])   . Hypertension   . Obesity   . Shoulder pain, bilateral   . Sleep apnea    was fitted for mask and never got machine - too costly    Past Surgical History:  Procedure Laterality Date  . CARPAL TUNNEL RELEASE Right 12/2015   [facility]  . right hand surgery    . ROTATOR CUFF REPAIR      There were no vitals filed for this visit.   Subjective Assessment - 03/15/17 1234    Subjective  Patient reporting R sided low back pain that began 4-5 months ago and has been consistently present ever since. Patient cannot identify a specific cause at onset of pain, however does have a history of bulging disc at L4-5 several years ago for which he received injections and has had relief since then. The current problem is worse with prolonged sitting, heavy house work and changing positions. Patient denies numbness/tingling or radiating pain in buttock and B LE. Patient reports he has a self-purchased back brace at home which he wears when he knows he has heavy activity to do or "when pain reaches 10/10".     Pertinent History  DM, HTN (controlled)    Limitations  Sitting;House hold activities    How long can you sit  comfortably?  1hr    How long can you stand comfortably?  no issue    How long can you walk comfortably?  pain immediately    Diagnostic tests  MRI    Patient Stated Goals  get rid of pain     Currently in Pain?  Yes    Pain Score  3     Pain Location  Back    Pain Orientation  Right;Lower    Pain Descriptors / Indicators  Tightness;Aching;Dull    Pain Type  Chronic pain    Pain Onset  More than a month ago    Pain Frequency  Intermittent    Aggravating Factors   prolonged sitting, housework/chores    Pain Relieving Factors  ibuprofen, bio freeze         [facility] PT Assessment - 03/15/17 1121      Assessment   Medical Diagnosis  Chronic Right sided low back pain without sciatica    Referring Provider  Dr. Karlton Lemon    Onset Date/Surgical Date  -- ~4-5 months ago    Next MD Visit  prn    Prior Therapy  no      Precautions   Precautions  None      Restrictions  Weight Bearing Restrictions  No      Balance Screen   Has the patient fallen in the past 6 months  No    Has the patient had a decrease in activity level because of a fear of falling?   No    Is the patient reluctant to leave their home because of a fear of falling?   No      Home Film/video editor residence    Living Arrangements  Spouse/significant other      Prior Function   Level of Independence  Independent    Vocation  Full time employment    Vocation Requirements  sitting, driving      Cognition   Overall Cognitive Status  Within Functional Limits for tasks assessed      Observation/Other Assessments   Focus on Therapeutic Outcomes (FOTO)   60% (40% limited, 71% predicted)      Sensation   Light Touch  Appears Intact      Coordination   Gross Motor Movements are Fluid and Coordinated  Yes      Functional Tests   Functional tests  Squat;Lunges      Squat   Comments  forward squat - weight through toes      Lunges   Comments  WNL - no increased pain       Posture/Postural Control   Posture/Postural Control  No significant limitations      ROM / Strength   AROM / PROM / Strength  AROM;Strength      AROM   AROM Assessment Site  Lumbar    Lumbar Flexion  forward flexion from hips - fingertips to mid shins    Lumbar Extension  25% limited - limited by pain    Lumbar - Right Side Bend  fingertip to knee, pain    Lumbar - Left Side Bend  fingertip to knee, pain    Lumbar - Right Rotation  WNL; pain    Lumbar - Left Rotation  WNL; pain      Strength   Strength Assessment Site  Hip    Right/Left Hip  Right;Left    Right Hip Flexion  5/5    Right Hip Extension  4+/5 increased pain to 7/10    Right Hip ABduction  4/5    Right Hip ADduction  4/5    Left Hip Flexion  5/5    Left Hip Extension  4/5 increased pain to 7/10    Left Hip ABduction  4+/5    Left Hip ADduction  4+/5      Flexibility   Soft Tissue Assessment /Muscle Length  yes    Hamstrings  mild tightness - B 90/90 to approx 75-80 degrees      Palpation   Palpation comment  TTP in R lumbar paraspinals, segmental hypomobility L2-5      Ambulation/Gait   Gait Comments  normal - no gait deviations             Objective measurements completed on examination: See above findings.      [facility] PT Treatment/Exercise - 03/15/17 1121      Exercises   Exercises  Lumbar;Knee/Hip      Lumbar Exercises: Stretches   Passive Hamstring Stretch  3 reps;30 seconds    Passive Hamstring Stretch Limitations  B LE; with strap    Single Knee to Chest Stretch  2 reps;20 seconds    Single Knee to Chest Stretch Limitations  bilateral    Press Ups  5 reps;10 seconds      Knee/Hip Exercises: Supine   Bridges  Both;10 reps             PT Education - 03/15/17 1158    Education provided  Yes    Education Details  exam findings, HEP, POC    Person(s) Educated  Patient    Methods  Explanation;Demonstration;Handout    Comprehension  Verbalized understanding;Returned  demonstration       PT Short Term Goals - 03/15/17 1256      PT SHORT TERM GOAL #1   Title  Patient to be independent with initial HEP    Status  New    Target Date  03/29/17        PT Long Term Goals - 03/15/17 1257      PT LONG TERM GOAL #1   Title  Patient to be independent with advanced HEP    Status  New    Target Date  04/26/17      PT LONG TERM GOAL #2   Title  Patient to increase B LE strength to 5/5 in all planes to improve functional mobility    Status  New    Target Date  04/26/17      PT LONG TERM GOAL #3   Title  Patient to report pain no greater than 2/10 for a full day of work     Status  New    Target Date  04/26/17      PT LONG TERM GOAL #4   Title  Patient to report no use of back brace for household/leisure activities with pain no greater than 3/10    Status  New    Target Date  04/26/17      PT LONG TERM GOAL #5   Title  Patient to report returning to normal exercise routine without pain    Status  New    Target Date  04/26/17             Plan - 03/15/17 1246    Clinical Impression Statement  Mr. Kagawa is a 47 y.o male presenting to OPPT for primary complaint of R sided low back pain with seemingly insidious onset with no known mechanism of injury. Patient saw Dr. Barbaraann Barthel for MRI of back which was interpreted as a lumbar muscle strain with near same results of MRI in 2014. Patient demonstrating some limitations in lumbar extension as well as minor deficits in strength of B LE. Patient TTP in R lumbar paraspinals and some segmental hypomobility likely contributing to overall pain patterns. Patient would benefit from skilled PT services to address previously mentioned deficits in order to improve functional mobility and QOL.    History and Personal Factors relevant to plan of care:  hx bulging disc L4-5    Clinical Presentation  Stable    Clinical Decision Making  Low    Rehab Potential  Good    PT Frequency  2x / week    PT Duration  6  weeks    PT Treatment/Interventions  ADLs/Self Care Home Management;Electrical Stimulation;Iontophoresis '4mg'$ /ml Dexamethasone;Moist Heat;Traction;Ultrasound;Therapeutic exercise;Therapeutic activities;Functional mobility training;Neuromuscular re-education;Patient/family education;Passive range of motion;Manual techniques;Dry needling;Taping    Consulted and Agree with Plan of Care  Patient       Patient will benefit from skilled therapeutic intervention in order to improve the following deficits and impairments:  Pain, Decreased activity tolerance, Decreased endurance, Decreased strength  Visit Diagnosis: Chronic right-sided low  back pain without sciatica  Other symptoms and signs involving the musculoskeletal system     Problem List Patient Active Problem List   Diagnosis Date Noted  . Low back pain 01/30/2017  . Exertional chest pain 11/07/2016  . Chronic diastolic heart failure ([place]) 11/07/2016  . Plantar fasciitis of left foot 05/27/2016  . Medial epicondylitis of right elbow 11/18/2015  . Paresthesia of both hands 05/13/2015  . Bilateral carpal tunnel syndrome 12/18/2014  . OSA (obstructive sleep apnea) 04/18/2012  . 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 ? DVT of popliteal vein, right (non occlusive) 09/03/2011  . Pulmonary embolism ([place]) 09/02/2011  . DM type 2 (diabetes mellitus, type 2) ([place]) 09/02/2011  . HTN (hypertension), benign 09/02/2011     Mickle Asper, SPT 03/15/17 4:36 PM  PHYSICAL THERAPY DISCHARGE SUMMARY  Visits from Start of Care: 1  Current functional level related to goals / functional outcomes: See above   Remaining deficits: See above; unable to assess as patient did not return   Education / Equipment: HEP  Plan: Patient agrees to discharge.  Patient goals were not met. Patient is being discharged due to not returning since the last visit.  ?????     Patient  d/c from PT due to not returning for >30 days with excessive cancellation/no show rate.  Lanney Gins, PT, DPT 04/20/17 4:04 PM   [facility] [address]  [place], [place], [address] Phone: [phone]   Fax:  [phone]  Name: TASHAUN OBEY MRN: [id number] Date of Birth: 1970-10-15

## 2017-03-22 ENCOUNTER — Ambulatory Visit: Payer: 59 | Admitting: Physical Therapy

## 2017-03-29 ENCOUNTER — Ambulatory Visit: Payer: 59

## 2017-03-31 IMAGING — DX DG CHEST 2V
2 series · 2 of 2 positions shown · non-contrast
Comparison: September 02, 2011.

CLINICAL DATA: Productive cough.

EXAM:
CHEST  2 VIEW

[chest pa]
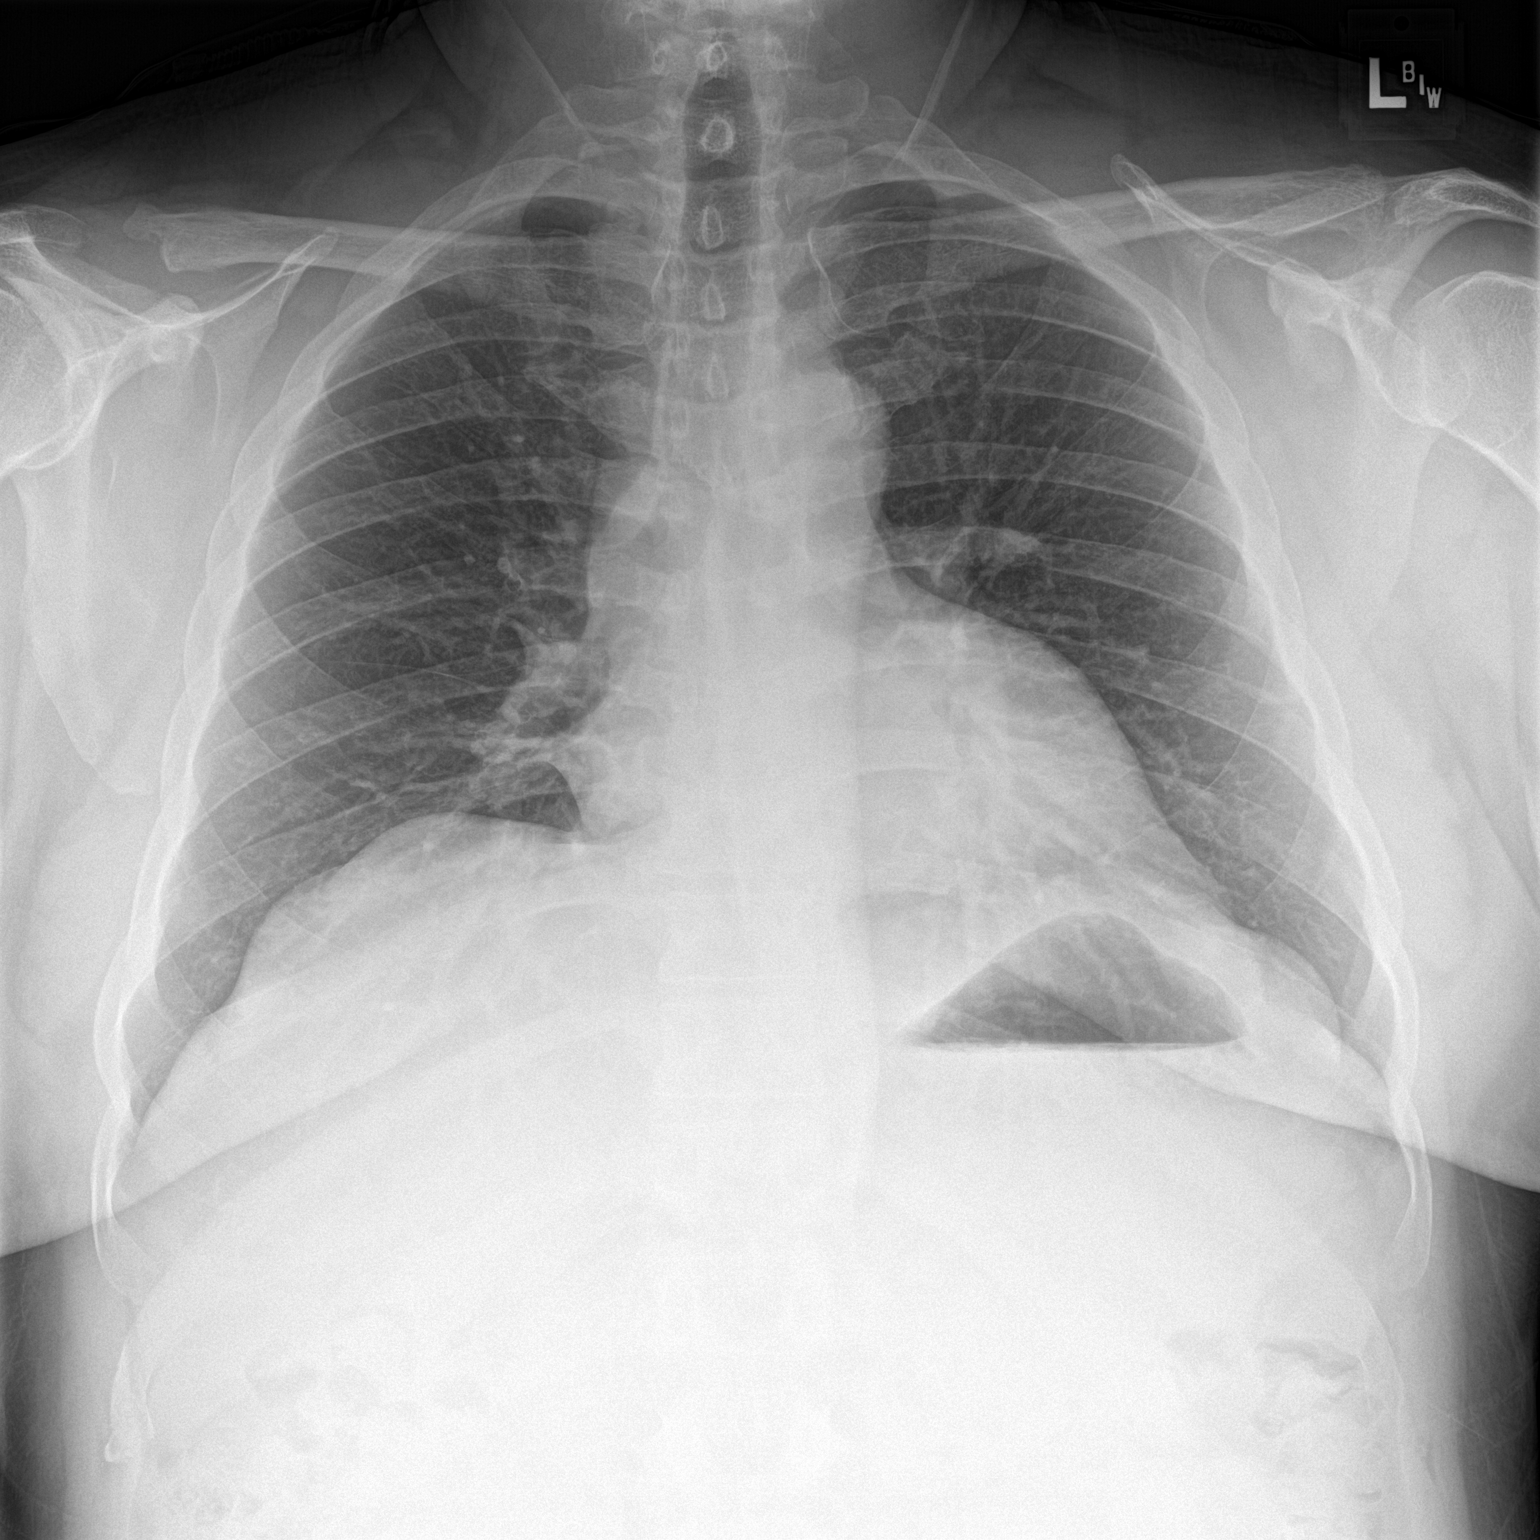

[chest lat]
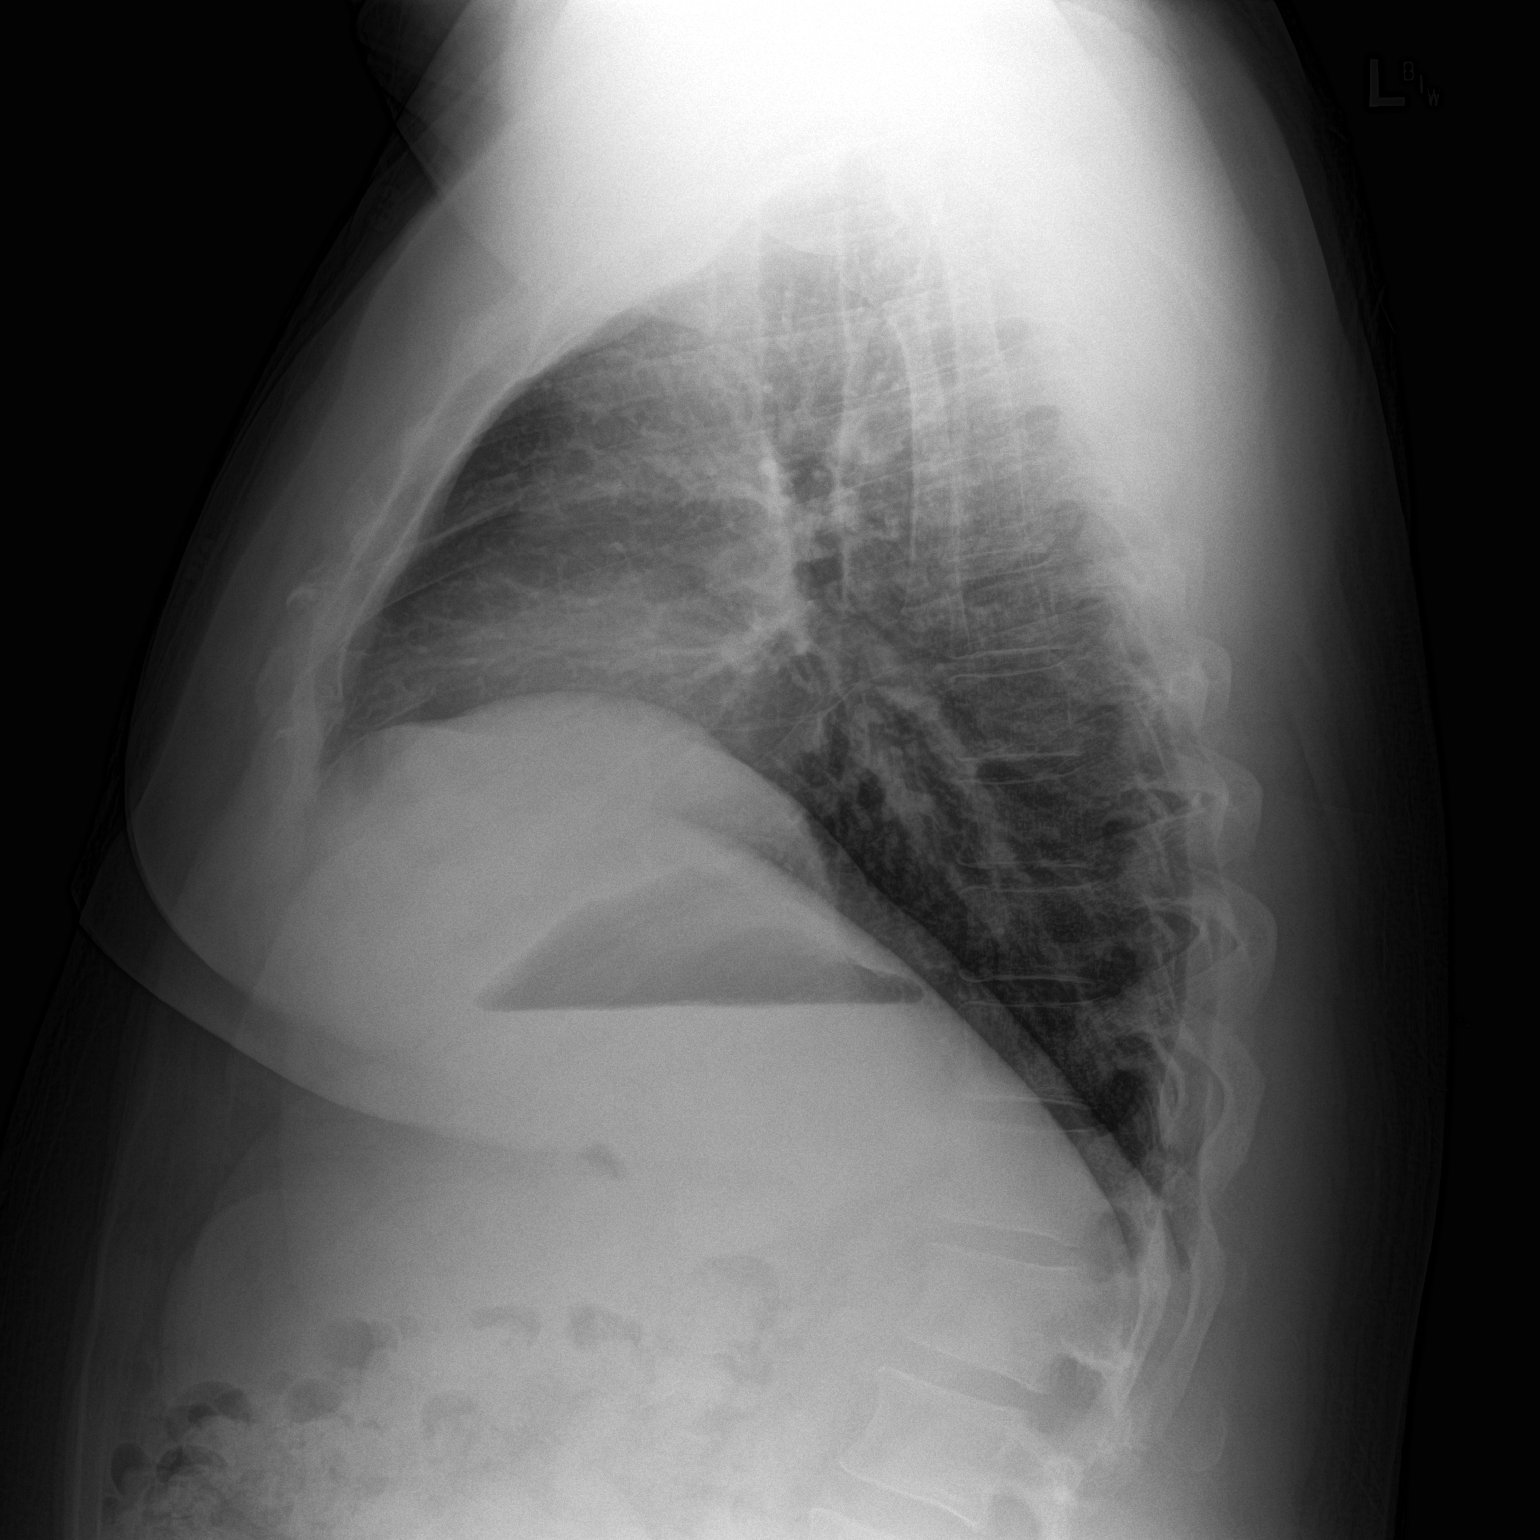

[2 of 2 positions shown; findings below may reference images not displayed]

FINDINGS: The heart size and mediastinal contours are within normal limits.
Both lungs are clear. No pneumothorax or pleural effusion is noted.
The visualized skeletal structures are unremarkable.
IMPRESSION: No active cardiopulmonary disease.

## 2017-04-04 ENCOUNTER — Other Ambulatory Visit: Payer: Self-pay | Admitting: Family Medicine

## 2017-04-05 ENCOUNTER — Ambulatory Visit: Payer: 59

## 2017-04-12 ENCOUNTER — Ambulatory Visit: Payer: 59 | Admitting: Physical Therapy

## 2017-04-20 ENCOUNTER — Ambulatory Visit: Payer: 59 | Attending: Family Medicine | Admitting: Physical Therapy

## 2017-04-29 ENCOUNTER — Ambulatory Visit (HOSPITAL_COMMUNITY)
Admission: EM | Admit: 2017-04-29 | Discharge: 2017-04-29 | Disposition: A | Discharge status: Home / Self Care | Payer: 59 | Attending: Internal Medicine | Admitting: Internal Medicine

## 2017-04-29 ENCOUNTER — Other Ambulatory Visit: Payer: Self-pay

## 2017-04-29 ENCOUNTER — Encounter (HOSPITAL_COMMUNITY): Payer: Self-pay | Admitting: Emergency Medicine

## 2017-04-29 DIAGNOSIS — J40 Bronchitis, not specified as acute or chronic: Secondary | ICD-10-CM

## 2017-04-29 DIAGNOSIS — J019 Acute sinusitis, unspecified: Secondary | ICD-10-CM | POA: Diagnosis not present

## 2017-04-29 DIAGNOSIS — E119 Type 2 diabetes mellitus without complications: Secondary | ICD-10-CM

## 2017-04-29 DIAGNOSIS — H6593 Unspecified nonsuppurative otitis media, bilateral: Secondary | ICD-10-CM | POA: Diagnosis not present

## 2017-04-29 LAB — GLUCOSE, CAPILLARY: GLUCOSE-CAPILLARY: 189 mg/dL — AB (ref 65–99)

## 2017-04-29 MED ORDER — GLUCOSE BLOOD VI STRP: ORAL_STRIP | 1 refills | Status: DC

## 2017-04-29 MED ORDER — AMOXICILLIN-POT CLAVULANATE 875-125 MG PO TABS: 1.0000 | ORAL_TABLET | Freq: Two times a day (BID) | ORAL | 0 refills | Status: DC

## 2017-04-29 MED ORDER — PREDNISONE 10 MG PO TABS: 40.0000 mg | ORAL_TABLET | Freq: Every day | ORAL | 0 refills | Status: AC

## 2017-04-29 MED ORDER — BENZONATATE 100 MG PO CAPS: 100.0000 mg | ORAL_CAPSULE | Freq: Three times a day (TID) | ORAL | 0 refills | Status: DC

## 2017-04-29 NOTE — ED Triage Notes (Signed)
Pt c/o cough, bilateral ears feeling "stopped up" x4 days.

## 2017-04-29 NOTE — ED Provider Notes (Signed)
[facility]    CSN: [id number] Arrival date & time: 04/29/17  1646     History   Chief Complaint Chief Complaint  Patient presents with  . Otalgia  . Cough    HPI Scott [name] is a 46 y.o. male.   46 year old male, with history of diabetes, hypertension, presenting today with his daughter due to cold symptoms.  States that he has had cough, bilateral ear fullness and nasal congestion for the past 5 days.  States that his ear is been bothering him for the past 2 weeks.  States that he was seen in urgent care and diagnosed with an ear infection and given an antibiotic.  States that this did not help and his symptoms have continued.  He denies any fever or chills.  States that his daughter has been sick with similar symptoms.  He has been using over-the-counter medications with mild relief.   The history is provided by the patient.  URI  Presenting symptoms: congestion, cough and ear pain   Presenting symptoms: no fever and no sore throat   Severity:  Moderate Onset quality:  Gradual Duration:  5 days Timing:  Constant Progression:  Unchanged Chronicity:  New Relieved by:  Nothing Worsened by:  Nothing Ineffective treatments:  OTC medications Associated symptoms: sinus pain   Associated symptoms: no arthralgias, no headaches, no myalgias, no neck pain, no sneezing, no swollen glands and no wheezing   Risk factors: sick contacts   Risk factors: not elderly, no chronic cardiac disease, no chronic kidney disease, no chronic respiratory disease, no diabetes mellitus, no immunosuppression, no recent illness and no recent travel     Past Medical History:  Diagnosis Date  . Carpal tunnel syndrome   . Diabetes mellitus   . DVT (deep venous thrombosis) ([facility])   . Hypertension   . Obesity   . Shoulder pain, bilateral   . Sleep apnea    was fitted for mask and never got machine - too costly    Patient Active Problem List   Diagnosis Date Noted  . Low back  pain 01/30/2017  . Exertional chest pain 11/07/2016  . Chronic diastolic heart failure ([facility]) 11/07/2016  . Plantar fasciitis of left foot 05/27/2016  . Medial epicondylitis of right elbow 11/18/2015  . Paresthesia of both hands 05/13/2015  . Bilateral carpal tunnel syndrome 12/18/2014  . OSA (obstructive sleep apnea) 04/18/2012  . 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 ? DVT of popliteal vein, right (non occlusive) 09/03/2011  . Pulmonary embolism ([facility]) 09/02/2011  . DM type 2 (diabetes mellitus, type 2) ([facility]) 09/02/2011  . HTN (hypertension), benign 09/02/2011    Past Surgical History:  Procedure Laterality Date  . CARPAL TUNNEL RELEASE Right 12/04/2015   Procedure: RIGHT CARPAL TUNNEL RELEASE;  Surgeon: Betha Loa, MD;  Location: [facility];  Service: Orthopedics;  Laterality: Right;  RIGHT CARPAL TUNNEL RELEASE  . CARPAL TUNNEL RELEASE Right 12/2015   [facility]  . CARPAL TUNNEL RELEASE Left 04/01/2016   Procedure: LEFT CARPAL TUNNEL RELEASE;  Surgeon: Betha Loa, MD;  Location: [facility];  Service: Orthopedics;  Laterality: Left;  . right hand surgery    . ROTATOR CUFF REPAIR         Home Medications    Prior to Admission medications   Medication Sig Start Date End Date Taking? Authorizing Provider  amLODipine (NORVASC) 10 MG tablet Take 1 tablet (10 mg total)  by mouth daily. 01/12/17  Yes Lizbeth BarkHairston, Mandesia R, FNP  aspirin EC 81 MG tablet Take 1 tablet (81 mg total) by mouth daily. 11/05/16  Yes Hairston, Mandesia R, FNP  fluticasone (FLONASE) 50 MCG/ACT nasal spray SPRAY TWO SPRAYS INTO BOTH NOSTRILS  DAILY 04/04/17  Yes Hairston, Mandesia R, FNP  ibuprofen (ADVIL,MOTRIN) 600 MG tablet Take 1 tablet (600 mg total) by mouth every 8 (eight) hours as needed (Take with food.). 11/05/16  Yes Hairston, Mandesia R, FNP  Linagliptin-Metformin HCl (JENTADUETO) 2.08-998 MG TABS Take 1 tablet by  mouth 2 (two) times daily. 11/24/16  Yes Hairston, Mandesia R, FNP  lisinopril (PRINIVIL,ZESTRIL) 20 MG tablet Take 1 tablet (20 mg total) by mouth daily. 11/05/16  Yes Hairston, Mandesia R, FNP  naproxen (NAPROSYN) 500 MG tablet TAKE ONE TABLET BY MOUTH TWICE A DAY  WITH MEALS FOR 10 DAYS. THEN TAKE ONE TABLET BY MOUTH ONCE A DAY WITH MEAL AS NEEDED. 01/12/17  Yes Hairston, Mandesia R, FNP  omeprazole (PRILOSEC) 20 MG capsule Take 20 mg by mouth daily as needed (heartburn).   Yes [provider]  amoxicillin-clavulanate (AUGMENTIN) 875-125 MG tablet Take 1 tablet by mouth every 12 (twelve) hours. 04/29/17   Xiadani Damman C, PA-C  atorvastatin (LIPITOR) 20 MG tablet Take 1 tablet (20 mg total) by mouth daily. 11/05/16   Lizbeth BarkHairston, Mandesia R, FNP  benzonatate (TESSALON) 100 MG capsule Take 1 capsule (100 mg total) by mouth every 8 (eight) hours. 04/29/17   Keyler Hoge C, PA-C  fexofenadine (ALLEGRA) 180 MG tablet Take 180 mg by mouth daily as needed for allergies or rhinitis.    [provider]  glucose blood (ONE TOUCH ULTRA TEST) test strip Use as instructed 04/29/17   Amayah Staheli C, PA-C  INVOKANA 300 MG TABS tablet Take 300 mg by mouth daily. 08/21/16   [provider]  predniSONE (DELTASONE) 10 MG tablet Take 4 tablets (40 mg total) by mouth daily for 5 days. 04/29/17 05/04/17  Alecia LemmingBlue, Roshon Duell C, PA-C    Family History Family History  Problem Relation Age of Onset  . Hypertension Mother   . Kidney disease Father   . Hypertension Other   . Hyperlipidemia Other   . Diabetes Other     Social History Social History   Tobacco Use  . Smoking status: Never Smoker  . Smokeless tobacco: Never Used  Substance Use Topics  . Alcohol use: No    Alcohol/week: 0.0 oz  . Drug use: No     Allergies   Patient has no known allergies.   Review of Systems Review of Systems  Constitutional: Negative for chills and fever.  HENT: Positive for congestion, ear pain and sinus  pain. Negative for sneezing and sore throat.   Eyes: Negative for pain and visual disturbance.  Respiratory: Positive for cough. Negative for shortness of breath and wheezing.   Cardiovascular: Negative for chest pain and palpitations.  Gastrointestinal: Negative for abdominal pain and vomiting.  Genitourinary: Negative for dysuria and hematuria.  Musculoskeletal: Negative for arthralgias, back pain, myalgias and neck pain.  Skin: Negative for color change and rash.  Neurological: Negative for seizures, syncope and headaches.  All other systems reviewed and are negative.    Physical Exam Triage Vital Signs ED Triage Vitals [04/29/17 1723]  Enc Vitals Group     BP      Pulse      Resp      Temp      Temp src  SpO2      Weight      Height      Head Circumference      Peak Flow      Pain Score 0     Pain Loc      Pain Edu?      Excl. in GC?    No data found.  Updated Vital Signs There were no vitals taken for this visit.  Visual Acuity Right Eye Distance:   Left Eye Distance:   Bilateral Distance:    Right Eye Near:   Left Eye Near:    Bilateral Near:     Physical Exam  Constitutional: He appears well-developed and well-nourished.  HENT:  Head: Normocephalic and atraumatic.  Right Ear: Hearing, external ear and ear canal normal. A middle ear effusion is present.  Left Ear: Hearing, external ear and ear canal normal. A middle ear effusion is present.  Nose: Nose normal.  Mouth/Throat: Uvula is midline and oropharynx is clear and moist. No oropharyngeal exudate, posterior oropharyngeal edema, posterior oropharyngeal erythema or tonsillar abscesses.  Eyes: Conjunctivae are normal.  Neck: Neck supple.  Cardiovascular: Normal rate and regular rhythm.  No murmur heard. Pulmonary/Chest: Effort normal and breath sounds normal. No stridor. No respiratory distress. He has no decreased breath sounds. He has no wheezes. He has no rhonchi. He has no rales.  Abdominal:  Soft. There is no tenderness.  Musculoskeletal: He exhibits no edema.  Neurological: He is alert.  Skin: Skin is warm and dry.  Psychiatric: He has a normal mood and affect.  Nursing note and vitals reviewed.    UC Treatments / Results  Labs (all labs ordered are listed, but only abnormal results are displayed) Labs Reviewed  GLUCOSE, CAPILLARY - Abnormal; Notable for the following components:      Result Value   Glucose-Capillary 189 (*)    All other components within normal limits    EKG  EKG Interpretation None       Radiology No results found.  Procedures Procedures (including critical care time)  Medications Ordered in UC Medications - No data to display   Initial Impression / Assessment and Plan / UC Course  I have reviewed the triage vital signs and the nursing notes.  Pertinent labs & imaging results that were available during my care of the patient were reviewed by me and considered in my medical decision making (see chart for details).     Bilateral middle ear effusion.  Due to duration of symptoms and daughter at home with similar symptoms, will treat with Augmentin and prednisone as well as cough medication.  He understands that prednisone will likely increase his blood sugar.  He will keep close monitor on her sugar.  Final Clinical Impressions(s) / UC Diagnoses   Final diagnoses:  Acute non-recurrent sinusitis, unspecified location  Bronchitis  Fluid level behind tympanic membrane of both ears    ED Discharge Orders        Ordered    predniSONE (DELTASONE) 10 MG tablet  Daily     04/29/17 1753    amoxicillin-clavulanate (AUGMENTIN) 875-125 MG tablet  Every 12 hours     04/29/17 1753    glucose blood (ONE TOUCH ULTRA TEST) test strip     04/29/17 1753    benzonatate (TESSALON) 100 MG capsule  Every 8 hours     04/29/17 1810       Controlled Substance Prescriptions [place] Controlled Substance Registry consulted? Not Applicable   Camdin Hegner,  Marylene Land, PA-C 04/29/17 1859

## 2017-06-21 ENCOUNTER — Ambulatory Visit: Payer: 59 | Admitting: Family Medicine

## 2017-06-23 ENCOUNTER — Ambulatory Visit (INDEPENDENT_AMBULATORY_CARE_PROVIDER_SITE_OTHER): Payer: 59 | Admitting: Family Medicine

## 2017-06-23 ENCOUNTER — Encounter: Payer: Self-pay | Admitting: Family Medicine

## 2017-06-23 DIAGNOSIS — M722 Plantar fascial fibromatosis: Secondary | ICD-10-CM | POA: Diagnosis not present

## 2017-06-23 MED ORDER — METHYLPREDNISOLONE ACETATE 40 MG/ML IJ SUSP
40.0000 mg | Freq: Once | INTRAMUSCULAR | Status: AC
  Administered 2017-06-23: 40 mg via INTRA_ARTICULAR

## 2017-06-23 NOTE — Progress Notes (Signed)
PCP: Lizbeth Bark, FNP  Subjective:   HPI: Patient is a 47 y.o. male here for left heel pain.  1/24: Patient reports for just over a month he's had plantar left heel pain. Pain 7/10 at rest, up to 10/10 with pressure on foot. Worse when going from sitting to standing, sharp. Doing some stretching and using arch binder without much benefit. Not taking any medicine for this. No skin changes, numbness.  11/12/16: Patient reports his arch pain started to come back about 1 1/2 months ago. Pain level is 4/10 back at plantar heel but up to 9/10 and sharp at worst with a lot of walking. Doing home stretches, exercises, inserts all which help some. Arch binders worsened pain so not using. No skin changes, numbness.  06/23/17: Patient reports he did well following last visit until last several weeks. Pain plantar left heel is sharp, up to 10/10. Worse with walking. Doing home exercises some but not every day. A pad placed in shoes has helped. Arch binders were too tight. No skin changes, numbness.  Past Medical History:  Diagnosis Date  . Carpal tunnel syndrome   . Diabetes mellitus   . DVT (deep venous thrombosis) (HCC)   . Hypertension   . Obesity   . Shoulder pain, bilateral   . Sleep apnea    was fitted for mask and never got machine - too costly    Current Outpatient Medications on File Prior to Visit  Medication Sig Dispense Refill  . amLODipine (NORVASC) 10 MG tablet Take 1 tablet (10 mg total) by mouth daily. 90 tablet 3  . aspirin EC 81 MG tablet Take 1 tablet (81 mg total) by mouth daily. 90 tablet 3  . atorvastatin (LIPITOR) 20 MG tablet Take 1 tablet (20 mg total) by mouth daily. 90 tablet 3  . fexofenadine (ALLEGRA) 180 MG tablet Take 180 mg by mouth daily as needed for allergies or rhinitis.    . fluticasone (FLONASE) 50 MCG/ACT nasal spray SPRAY TWO SPRAYS INTO BOTH NOSTRILS  DAILY 16 g 5  . glucose blood (ONE TOUCH ULTRA TEST) test strip Use as instructed  100 each 1  . ibuprofen (ADVIL,MOTRIN) 600 MG tablet Take 1 tablet (600 mg total) by mouth every 8 (eight) hours as needed (Take with food.). 30 tablet 1  . INVOKANA 300 MG TABS tablet Take 300 mg by mouth daily.    . Linagliptin-Metformin HCl (JENTADUETO) 2.08-998 MG TABS Take 1 tablet by mouth 2 (two) times daily. 180 tablet 3  . lisinopril (PRINIVIL,ZESTRIL) 20 MG tablet Take 1 tablet (20 mg total) by mouth daily. 90 tablet 3  . naproxen (NAPROSYN) 500 MG tablet TAKE ONE TABLET BY MOUTH TWICE A DAY  WITH MEALS FOR 10 DAYS. THEN TAKE ONE TABLET BY MOUTH ONCE A DAY WITH MEAL AS NEEDED. 60 tablet 0  . omeprazole (PRILOSEC) 20 MG capsule Take 20 mg by mouth daily as needed (heartburn).     No current facility-administered medications on file prior to visit.     Past Surgical History:  Procedure Laterality Date  . CARPAL TUNNEL RELEASE Right 12/04/2015   Procedure: RIGHT CARPAL TUNNEL RELEASE;  Surgeon: Betha Loa, MD;  Location: [facility];  Service: Orthopedics;  Laterality: Right;  RIGHT CARPAL TUNNEL RELEASE  . CARPAL TUNNEL RELEASE Right 12/2015   [facility]  . CARPAL TUNNEL RELEASE Left 04/01/2016   Procedure: LEFT CARPAL TUNNEL RELEASE;  Surgeon: Betha Loa, MD;  Location: [facility];  Service: Orthopedics;  Laterality: Left;  . right hand surgery    . ROTATOR CUFF REPAIR      No Known Allergies  Social History   Socioeconomic History  . Marital status: Married    Spouse name: Not on file  . Number of children: Not on file  . Years of education: 12+  . Highest education level: Not on file  Social Needs  . Financial resource strain: Not on file  . Food insecurity - worry: Not on file  . Food insecurity - inability: Not on file  . Transportation needs - medical: Not on file  . Transportation needs - non-medical: Not on file  Occupational History  . Occupation: real Insurance account managerestate agent  Tobacco Use  . Smoking status: Never Smoker  . Smokeless tobacco:  Never Used  Substance and Sexual Activity  . Alcohol use: No    Alcohol/week: 0.0 oz  . Drug use: No  . Sexual activity: Not on file  Other Topics Concern  . Not on file  Social History Narrative   Lives at home.   Right-handed.   Occasional caffeine use.    Family History  Problem Relation Age of Onset  . Hypertension Mother   . Kidney disease Father   . Hypertension Other   . Hyperlipidemia Other   . Diabetes Other     BP 133/90   Pulse 81   Ht 5\' 6"  (1.676 m)   Wt 231 lb (104.8 kg)   BMI 37.28 kg/m   Review of Systems: See HPI above.     Objective:  Physical Exam:  Gen: NAD, comfortable in exam room.  Left foot/ankle: Mod pronation.  No gross deformity, swelling, ecchymoses. FROM with 5/5 strength all directions. TTP medial plantar calcaneus at plantar fascia insertion. Negative ant drawer and talar tilt.   Negative syndesmotic compression. Negative calcaneal squeeze. Thompsons test negative. NV intact distally.  Right foot/ankle: No deformity. FROM with 5/5 strength. No tenderness to palpation. NVI distally.   Assessment & Plan:  1. Left plantar fasciitis - Injection repeated today.  Reviewed home exercises and stretches.  He will consider custom orthotics and make appointment for these if he would like.  Icing, tylenol, aleve if needed.  Consider night splints, physical therapy.  After informed written consent patient was seated on exam table and timeout was performed.  Area overlying left medial plantar fascia prepped with alcohol swab then using ultrasound guidance with multiple needle fenestrations, patient's left plantar fascia was injected with 2:1 bupivicaine: depomedrol.  Patient tolerated procedure well without immediate complications.

## 2017-06-23 NOTE — Assessment & Plan Note (Signed)
Injection repeated today.  Reviewed home exercises and stretches.  He will consider custom orthotics and make appointment for these if he would like.  Icing, tylenol, aleve if needed.  Consider night splints, physical therapy.  After informed written consent patient was seated on exam table and timeout was performed.  Area overlying left medial plantar fascia prepped with alcohol swab then using ultrasound guidance with multiple needle fenestrations, patient's left plantar fascia was injected with 2:1 bupivicaine: depomedrol.  Patient tolerated procedure well without immediate complications.

## 2017-06-23 NOTE — Patient Instructions (Signed)
You have plantar fasciitis Take tylenol or aleve as needed for pain  Plantar fascia stretch for 20-30 seconds (do 3 of these) in morning Lowering/raise on a step exercises 3 x 10 once or twice a day - this is very important for long term recovery. Can add heel walks, toe walks forward and backward as well Ice heel for 15 minutes as needed. Avoid flat shoes/barefoot walking as much as possible. Orthotics are helpful - consider making an appointment up front for custom orthotics and [place] will block out 45 minutes. Steroid injection is a consideration for short term pain relief if you are struggling - you were given this today. Consider night splints. Physical therapy is also an option - let me know if you want to try this. Follow up with me in 1 month.

## 2017-06-29 ENCOUNTER — Encounter: Payer: Self-pay | Admitting: Physician Assistant

## 2017-06-29 ENCOUNTER — Ambulatory Visit: Payer: 59 | Attending: Internal Medicine | Admitting: Physician Assistant

## 2017-06-29 VITALS — BP 143/96 | HR 91 | Temp 98.4°F | Ht 66.0 in | Wt 237.8 lb

## 2017-06-29 DIAGNOSIS — R079 Chest pain, unspecified: Secondary | ICD-10-CM

## 2017-06-29 DIAGNOSIS — Z841 Family history of disorders of kidney and ureter: Secondary | ICD-10-CM | POA: Insufficient documentation

## 2017-06-29 DIAGNOSIS — Z7982 Long term (current) use of aspirin: Secondary | ICD-10-CM | POA: Insufficient documentation

## 2017-06-29 DIAGNOSIS — J40 Bronchitis, not specified as acute or chronic: Secondary | ICD-10-CM

## 2017-06-29 DIAGNOSIS — E1141 Type 2 diabetes mellitus with diabetic mononeuropathy: Secondary | ICD-10-CM

## 2017-06-29 DIAGNOSIS — I10 Essential (primary) hypertension: Secondary | ICD-10-CM

## 2017-06-29 DIAGNOSIS — E669 Obesity, unspecified: Secondary | ICD-10-CM | POA: Insufficient documentation

## 2017-06-29 DIAGNOSIS — Z79899 Other long term (current) drug therapy: Secondary | ICD-10-CM | POA: Insufficient documentation

## 2017-06-29 DIAGNOSIS — E119 Type 2 diabetes mellitus without complications: Secondary | ICD-10-CM | POA: Diagnosis not present

## 2017-06-29 DIAGNOSIS — G4730 Sleep apnea, unspecified: Secondary | ICD-10-CM | POA: Insufficient documentation

## 2017-06-29 DIAGNOSIS — G5600 Carpal tunnel syndrome, unspecified upper limb: Secondary | ICD-10-CM | POA: Insufficient documentation

## 2017-06-29 DIAGNOSIS — Z760 Encounter for issue of repeat prescription: Secondary | ICD-10-CM

## 2017-06-29 LAB — GLUCOSE, POCT (MANUAL RESULT ENTRY): POC GLUCOSE: 256 mg/dL — AB (ref 70–99)

## 2017-06-29 LAB — POCT GLYCOSYLATED HEMOGLOBIN (HGB A1C): HEMOGLOBIN A1C: 8.1

## 2017-06-29 MED ORDER — LINAGLIPTIN-METFORMIN HCL 2.5-1000 MG PO TABS: 1.0000 | ORAL_TABLET | Freq: Two times a day (BID) | ORAL | 3 refills | Status: DC

## 2017-06-29 MED ORDER — FLUTICASONE PROPIONATE 50 MCG/ACT NA SUSP: NASAL | 5 refills | Status: DC

## 2017-06-29 MED ORDER — OMEPRAZOLE 20 MG PO CPDR: 20.0000 mg | DELAYED_RELEASE_CAPSULE | Freq: Every day | ORAL | 3 refills | Status: DC | PRN

## 2017-06-29 MED ORDER — AMLODIPINE BESYLATE 10 MG PO TABS: 10.0000 mg | ORAL_TABLET | Freq: Every day | ORAL | 3 refills | Status: DC

## 2017-06-29 MED ORDER — ATORVASTATIN CALCIUM 20 MG PO TABS: 20.0000 mg | ORAL_TABLET | Freq: Every day | ORAL | 3 refills | Status: DC

## 2017-06-29 MED ORDER — LISINOPRIL 20 MG PO TABS: 20.0000 mg | ORAL_TABLET | Freq: Every day | ORAL | 3 refills | Status: DC

## 2017-06-29 MED ORDER — AZITHROMYCIN 250 MG PO TABS: ORAL_TABLET | ORAL | 0 refills | Status: DC

## 2017-06-29 MED ORDER — INVOKANA 300 MG PO TABS: 300.0000 mg | ORAL_TABLET | Freq: Every day | ORAL | 3 refills | Status: DC

## 2017-06-29 NOTE — Patient Instructions (Addendum)
Check blood sugars fasting and at bedtime and record.  Work on diet and exercise.    Check blood pressure 2-3 times/week and record and bring to your next visit.    Call Dr Verdis PrimeHenry Smith for cardiology appt.

## 2017-06-29 NOTE — Progress Notes (Signed)
Patient ID: Scott [name], male   DOB: [birth year], 47 y.o.   MRN: [id number]     Scott [name], is a 47 y.o. male  [id number]  [id number]  DOB - 1970-11-12  Subjective:  Chief Complaint and HPI: Scott [name] is a 47 y.o. male here today for several concerns- CP yesterday, dull, came periodically on and off all day. Lasted about 30 seconds to 1 minute.  No SOB.  No N/V.  Center of chest pain.  No diaphoresis.  No radiating pain.  No CP today.  CP was independent of activity.  FH: no early heart conditions; dad had a lot of problems and was on dialysis  Blood sugars running under 200 all the time.  Has been off invokana for months bc he couldn't get it RF  Cold s/sx, head congestion, with cough X 5d.  +yellowish mucus and very congested  ROS:   Constitutional:  No f/c, No night sweats, No unexplained weight loss. EENT:  No vision changes, No blurry vision, No hearing changes. No other mouth, throat, or ear problems.  Respiratory: + cough, No SOB Cardiac: + CP, no palpitations GI:  No abd pain, No N/V/D. GU: No Urinary s/sx Musculoskeletal: No joint pain Neuro: No headache, no dizziness, no motor weakness.  Skin: No rash Endocrine:  No polydipsia. No polyuria.  Psych: Denies SI/HI  No problems updated.  ALLERGIES: No Known Allergies  PAST MEDICAL HISTORY: Past Medical History:  Diagnosis Date  . Carpal tunnel syndrome   . Diabetes mellitus   . DVT (deep venous thrombosis) (HCC)   . Hypertension   . Obesity   . Shoulder pain, bilateral   . Sleep apnea    was fitted for mask and never got machine - too costly    MEDICATIONS AT HOME: Prior to Admission medications   Medication Sig Start Date End Date Taking? Authorizing Provider  amLODipine (NORVASC) 10 MG tablet Take 1 tablet (10 mg total) by mouth daily. 06/29/17  Yes Anders Simmonds, PA-C  aspirin EC 81 MG tablet Take 1 tablet (81 mg total) by mouth daily. 11/05/16  Yes Hairston, Oren Beckmann, FNP    atorvastatin (LIPITOR) 20 MG tablet Take 1 tablet (20 mg total) by mouth daily. 06/29/17  Yes Georgian Co M, PA-C  fluticasone [facility]) 50 MCG/ACT nasal spray SPRAY TWO SPRAYS INTO BOTH NOSTRILS  DAILY 06/29/17  Yes Georgian Co M, PA-C  glucose blood (ONE TOUCH ULTRA TEST) test strip Use as instructed 04/29/17  Yes Blue, Olivia C, PA-C  ibuprofen (ADVIL,MOTRIN) 600 MG tablet Take 1 tablet (600 mg total) by mouth every 8 (eight) hours as needed (Take with food.). 11/05/16  Yes Hairston, Mandesia R, FNP  Linagliptin-metFORMIN HCl (JENTADUETO) 2.08-998 MG TABS Take 1 tablet by mouth 2 (two) times daily. 06/29/17  Yes Georgian Co M, PA-C  lisinopril (PRINIVIL,ZESTRIL) 20 MG tablet Take 1 tablet (20 mg total) by mouth daily. 06/29/17  Yes Kayci Belleville M, PA-C  omeprazole (PRILOSEC) 20 MG capsule Take 1 capsule (20 mg total) by mouth daily as needed (heartburn). 06/29/17  Yes Anders Simmonds, PA-C  azithromycin (ZITHROMAX) 250 MG tablet Take 2 today then 1 daily 06/29/17   Anders Simmonds, PA-C  fexofenadine (ALLEGRA) 180 MG tablet Take 180 mg by mouth daily as needed for allergies or rhinitis.    [provider]  INVOKANA 300 MG TABS tablet Take 1 tablet (300 mg total) by mouth daily. 06/29/17   Anders Simmonds, PA-C  naproxen (  NAPROSYN) 500 MG tablet TAKE ONE TABLET BY MOUTH TWICE A DAY  WITH MEALS FOR 10 DAYS. THEN TAKE ONE TABLET BY MOUTH ONCE A DAY WITH MEAL AS NEEDED. Patient not taking: Reported on 06/29/2017 01/12/17   Lizbeth BarkHairston, Mandesia R, FNP     Objective:  EXAM:   Vitals:   06/29/17 1558  BP: (!) 143/96  Pulse: 91  Temp: 98.4 F (36.9 C)  TempSrc: Oral  SpO2: 97%  Weight: 237 lb 12.8 oz (107.9 kg)  Height: 5\' 6"  (1.676 m)    General appearance : A&OX3. NAD. Non-toxic-appearing HEENT: Atraumatic and Normocephalic.  PERRLA. EOM intact.  TM full B. Mouth-MMM, post pharynx WNL w/o erythema, + PND. Neck: supple, no JVD. No cervical lymphadenopathy. No  thyromegaly Chest/Lungs:  Breathing-non-labored, Good air entry bilaterally, breath sounds normal without rales, rhonchi, or wheezing  CVS: S1 S2 regular, no murmurs, gallops, rubs  Extremities: Bilateral Lower Ext shows no edema, both legs are warm to touch with = pulse throughout Neurology:  CN II-XII grossly intact, Non focal.   Psych:  TP linear. J/I WNL. Normal speech. Appropriate eye contact and affect.  Skin:  No Rash  Data Review Lab Results  Component Value Date   HGBA1C 8.1 06/29/2017   HGBA1C 7.6 11/05/2016   HGBA1C 6.7 05/28/2016   EKG:  NSR with T wave abnormality in III, V5-reviewed with Dr Hyman HopesJegede  Assessment & Plan   1. Chest pain, unspecified type Do not believe cardiac. Negative cardiac work-up less than 1 year ago.  Will have him see cardiology again.  Call 911 if CP returns or worsens.    2. Type 2 diabetes mellitus without complication, without long-term current use of insulin (HCC) Uncontrolled-see #3 - HgB A1c - Glucose (CBG)  3. Type 2 diabetes mellitus with diabetic mononeuropathy, without long-term current use of insulin (HCC) Uncontrolled. I have had a lengthy discussion and provided education about insulin resistance and the intake of too much sugar/refined carbohydrates.  I have advised the patient to work at a goal of eliminating sugary drinks, candy, desserts, sweets, refined sugars, processed foods, and white carbohydrates.  The patient expresses understanding.  continue- Linagliptin-metFORMIN HCl (JENTADUETO) 2.08-998 MG TABS; Take 1 tablet by mouth 2 (two) times daily.  Dispense: 180 tablet; Refill: 3 resume- INVOKANA 300 MG TABS tablet; Take 1 tablet (300 mg total) by mouth daily.  Dispense: 30 tablet; Refill: 3 continue- atorvastatin (LIPITOR) 20 MG tablet; Take 1 tablet (20 mg total) by mouth daily.  Dispense: 90 tablet; Refill: 3 - Comprehensive metabolic panel  4. HTN (hypertension), benign Uncontrolled.  He will work on diet and exercise and  continue current medications.  We have discussed target BP range and blood pressure goal. I have advised patient to check BP regularly and to call us back or report to clinic if the numbers are consistently higher than 140/90. We discussed the importance of compliance with medical therapy and DASH diet recommended, consequences of uncontrolled hypertension discussed.  - amLODipine (NORVASC) 10 MG tablet; Take 1 tablet (10 mg total) by mouth daily.  Dispense: 90 tablet; Refill: 3 - Lipid panel continue- lisinopril (PRINIVIL,ZESTRIL) 20 MG tablet; Take 1 tablet (20 mg total) by mouth daily.  Dispense: 90 tablet; Refill: 3  6. Bronchitis Cover for atypicals - azithromycin (ZITHROMAX) 250 MG tablet; Take 2 today then 1 daily  Dispense: 6 tablet; Refill: 0 - fluticasone (FLONASE) 50 MCG/ACT nasal spray; SPRAY TWO SPRAYS INTO BOTH NOSTRILS  DAILY  Dispense: 16 g;  Refill: 5   Patient have been counseled extensively about nutrition and exercise  Return in about 2 months (around 08/27/2017) for assign PCP; f/up DM and htn.  The patient was given clear instructions to go to ER or return to medical center if symptoms don't improve, worsen or new problems develop. The patient verbalized understanding. The patient was told to call to get lab results if they haven't heard anything in the next week.     Georgian Co, PA-C [facility] [place], [place] [phone]   06/29/2017, 4:37 PM

## 2017-06-30 ENCOUNTER — Telehealth: Payer: Self-pay

## 2017-06-30 LAB — COMPREHENSIVE METABOLIC PANEL
A/G RATIO: 1.8 (ref 1.2–2.2)
ALK PHOS: 54 IU/L (ref 39–117)
ALT: 48 IU/L — AB (ref 0–44)
AST: 22 IU/L (ref 0–40)
Albumin: 4.6 g/dL (ref 3.5–5.5)
BILIRUBIN TOTAL: 0.5 mg/dL (ref 0.0–1.2)
BUN/Creatinine Ratio: 18 (ref 9–20)
BUN: 17 mg/dL (ref 6–24)
CHLORIDE: 98 mmol/L (ref 96–106)
CO2: 26 mmol/L (ref 20–29)
Calcium: 9.5 mg/dL (ref 8.7–10.2)
Creatinine, Ser: 0.94 mg/dL (ref 0.76–1.27)
GFR calc Af Amer: 111 mL/min/{1.73_m2} (ref >59)
GFR calc non Af Amer: 96 mL/min/{1.73_m2} (ref >59)
GLOBULIN, TOTAL: 2.6 g/dL (ref 1.5–4.5)
Glucose: 200 mg/dL — ABNORMAL HIGH (ref 65–99)
POTASSIUM: 4.1 mmol/L (ref 3.5–5.2)
SODIUM: 140 mmol/L (ref 134–144)
Total Protein: 7.2 g/dL (ref 6.0–8.5)

## 2017-06-30 LAB — LIPID PANEL
CHOL/HDL RATIO: 3.4 ratio (ref 0.0–5.0)
Cholesterol, Total: 108 mg/dL (ref 100–199)
HDL: 32 mg/dL — AB (ref >39)
LDL Calculated: 57 mg/dL (ref 0–99)
TRIGLYCERIDES: 94 mg/dL (ref 0–149)
VLDL Cholesterol Cal: 19 mg/dL (ref 5–40)

## 2017-06-30 NOTE — Telephone Encounter (Signed)
Contacted pt to go over lab results pt is aware and doesn't have any questions or concerns 

## 2017-07-01 ENCOUNTER — Encounter: Payer: Self-pay | Admitting: Pharmacist

## 2017-07-01 ENCOUNTER — Encounter: Payer: 59 | Admitting: Family Medicine

## 2017-07-01 NOTE — Progress Notes (Signed)
PA submitted for Invokana to [employer]. Pending approval.

## 2017-07-01 NOTE — Progress Notes (Signed)
PA approved for Invokana through 07/02/2018

## 2017-07-09 LAB — HM DIABETES EYE EXAM

## 2017-07-11 ENCOUNTER — Encounter: Payer: 59 | Admitting: Family Medicine

## 2017-07-19 ENCOUNTER — Ambulatory Visit (INDEPENDENT_AMBULATORY_CARE_PROVIDER_SITE_OTHER): Payer: 59 | Admitting: Family Medicine

## 2017-07-19 ENCOUNTER — Encounter: Payer: Self-pay | Admitting: Family Medicine

## 2017-07-19 DIAGNOSIS — M722 Plantar fascial fibromatosis: Secondary | ICD-10-CM | POA: Diagnosis not present

## 2017-07-20 ENCOUNTER — Encounter: Payer: Self-pay | Admitting: Family Medicine

## 2017-07-20 NOTE — Assessment & Plan Note (Signed)
much better following injection.  Given recurrences of this went ahead with custom orthotics today which felt comfortable albeit tight in his current shoes.  He's going to try to break these in over the next 2 weeks, if still noting they're too tight we can make adjustments or use dress orthotic for his current shoes.  F/u prn otherwise.  Patient was fitted for a : standard, cushioned, semi-rigid orthotic. The orthotic was heated and afterward the patient stood on the orthotic blank positioned on the orthotic stand. The patient was positioned in subtalar neutral position and 10 degrees of ankle dorsiflexion in a weight bearing stance. After completion of molding, a stable base was applied to the orthotic blank. The blank was ground to a stable position for weight bearing. Size: 13 blue swirl Base: blue med density eva Posting: none Additional orthotic padding: none  Total prep time 40 minutes - > 50% of which spent on counseling, answering questions.

## 2017-07-20 NOTE — Progress Notes (Signed)
PCP: Lizbeth Bark, FNP  Subjective:   HPI: Patient is a 47 y.o. male here for custom orthotics.  1/24: Patient reports for just over a month he's had plantar left heel pain. Pain 7/10 at rest, up to 10/10 with pressure on foot. Worse when going from sitting to standing, sharp. Doing some stretching and using arch binder without much benefit. Not taking any medicine for this. No skin changes, numbness.  11/12/16: Patient reports his arch pain started to come back about 1 1/2 months ago. Pain level is 4/10 back at plantar heel but up to 9/10 and sharp at worst with a lot of walking. Doing home stretches, exercises, inserts all which help some. Arch binders worsened pain so not using. No skin changes, numbness.  06/23/17: Patient reports he did well following last visit until last several weeks. Pain plantar left heel is sharp, up to 10/10. Worse with walking. Doing home exercises some but not every day. A pad placed in shoes has helped. Arch binders were too tight. No skin changes, numbness.  3/19: Patient returns today for custom orthotics. He is doing well after injection for his left plantar fasciitis without pain currently. No skin changes, numbness.  Past Medical History:  Diagnosis Date  . Carpal tunnel syndrome   . Diabetes mellitus   . DVT (deep venous thrombosis) (HCC)   . Hypertension   . Obesity   . Shoulder pain, bilateral   . Sleep apnea    was fitted for mask and never got machine - too costly    Current Outpatient Medications on File Prior to Visit  Medication Sig Dispense Refill  . amLODipine (NORVASC) 10 MG tablet Take 1 tablet (10 mg total) by mouth daily. 90 tablet 3  . aspirin EC 81 MG tablet Take 1 tablet (81 mg total) by mouth daily. 90 tablet 3  . atorvastatin (LIPITOR) 20 MG tablet Take 1 tablet (20 mg total) by mouth daily. 90 tablet 3  . azithromycin (ZITHROMAX) 250 MG tablet Take 2 today then 1 daily 6 tablet 0  . fexofenadine  (ALLEGRA) 180 MG tablet Take 180 mg by mouth daily as needed for allergies or rhinitis.    . fluticasone (FLONASE) 50 MCG/ACT nasal spray SPRAY TWO SPRAYS INTO BOTH NOSTRILS  DAILY 16 g 5  . glucose blood (ONE TOUCH ULTRA TEST) test strip Use as instructed 100 each 1  . ibuprofen (ADVIL,MOTRIN) 600 MG tablet Take 1 tablet (600 mg total) by mouth every 8 (eight) hours as needed (Take with food.). 30 tablet 1  . INVOKANA 300 MG TABS tablet Take 1 tablet (300 mg total) by mouth daily. 30 tablet 3  . Linagliptin-metFORMIN HCl (JENTADUETO) 2.08-998 MG TABS Take 1 tablet by mouth 2 (two) times daily. 180 tablet 3  . lisinopril (PRINIVIL,ZESTRIL) 20 MG tablet Take 1 tablet (20 mg total) by mouth daily. 90 tablet 3  . naproxen (NAPROSYN) 500 MG tablet TAKE ONE TABLET BY MOUTH TWICE A DAY  WITH MEALS FOR 10 DAYS. THEN TAKE ONE TABLET BY MOUTH ONCE A DAY WITH MEAL AS NEEDED. (Patient not taking: Reported on 06/29/2017) 60 tablet 0  . omeprazole (PRILOSEC) 20 MG capsule Take 1 capsule (20 mg total) by mouth daily as needed (heartburn). 30 capsule 3   No current facility-administered medications on file prior to visit.     Past Surgical History:  Procedure Laterality Date  . CARPAL TUNNEL RELEASE Right 12/04/2015   Procedure: RIGHT CARPAL TUNNEL RELEASE;  Surgeon: Caryn Bee  Merlyn LotKuzma, MD;  Location: [facility];  Service: Orthopedics;  Laterality: Right;  RIGHT CARPAL TUNNEL RELEASE  . CARPAL TUNNEL RELEASE Right 12/2015   [facility]  . CARPAL TUNNEL RELEASE Left 04/01/2016   Procedure: LEFT CARPAL TUNNEL RELEASE;  Surgeon: Betha LoaKevin Kuzma, MD;  Location: [facility];  Service: Orthopedics;  Laterality: Left;  . right hand surgery    . ROTATOR CUFF REPAIR      No Known Allergies  Social History   Socioeconomic History  . Marital status: Married    Spouse name: Not on file  . Number of children: Not on file  . Years of education: 12+  . Highest education level: Not on file  Social Needs   . Financial resource strain: Not on file  . Food insecurity - worry: Not on file  . Food insecurity - inability: Not on file  . Transportation needs - medical: Not on file  . Transportation needs - non-medical: Not on file  Occupational History  . Occupation: real Insurance account managerestate agent  Tobacco Use  . Smoking status: Never Smoker  . Smokeless tobacco: Never Used  Substance and Sexual Activity  . Alcohol use: No    Alcohol/week: 0.0 oz  . Drug use: No  . Sexual activity: Not on file  Other Topics Concern  . Not on file  Social History Narrative   Lives at home.   Right-handed.   Occasional caffeine use.    Family History  Problem Relation Age of Onset  . Hypertension Mother   . Kidney disease Father   . Hypertension Other   . Hyperlipidemia Other   . Diabetes Other     BP (!) 143/97   Pulse 74   Ht 5\' 6"  (1.676 m)   Wt 230 lb (104.3 kg)   BMI 37.12 kg/m   Review of Systems: See HPI above.     Objective:  Physical Exam:  Gen: NAD, comfortable in exam room.  Left foot/ankle: Mod pronation long arch.  No callus over transverse arch, collapse.  No other gross deformity, swelling, ecchymoses No hallux rigidus or valgus. FROM with 5/5 strength. No TTP NV intact distally.  Right foot/ankle: Mod pronation long arch.  No callus over transverse arch, collapse.  No other gross deformity, swelling, ecchymoses No hallux rigidus or valgus. FROM with 5/5 strength. No TTP NV intact distally.   Assessment & Plan:  1. Left plantar fasciitis - much better following injection.  Given recurrences of this went ahead with custom orthotics today which felt comfortable albeit tight in his current shoes.  He's going to try to break these in over the next 2 weeks, if still noting they're too tight we can make adjustments or use dress orthotic for his current shoes.  F/u prn otherwise.  Patient was fitted for a : standard, cushioned, semi-rigid orthotic. The orthotic was heated and  afterward the patient stood on the orthotic blank positioned on the orthotic stand. The patient was positioned in subtalar neutral position and 10 degrees of ankle dorsiflexion in a weight bearing stance. After completion of molding, a stable base was applied to the orthotic blank. The blank was ground to a stable position for weight bearing. Size: 13 blue swirl Base: blue med density eva Posting: none Additional orthotic padding: none  Total prep time 40 minutes - > 50% of which spent on counseling, answering questions.

## 2017-07-21 ENCOUNTER — Ambulatory Visit: Payer: 59 | Admitting: Family Medicine

## 2017-07-26 ENCOUNTER — Ambulatory Visit: Payer: 59 | Admitting: Family Medicine

## 2017-07-28 ENCOUNTER — Ambulatory Visit: Payer: 59 | Admitting: Cardiology

## 2017-07-28 ENCOUNTER — Encounter: Payer: Self-pay | Admitting: Family Medicine

## 2017-07-28 ENCOUNTER — Encounter: Payer: 59 | Admitting: Family Medicine

## 2017-07-28 ENCOUNTER — Telehealth: Payer: Self-pay | Admitting: Family Medicine

## 2017-07-28 NOTE — Progress Notes (Deleted)
Cardiology Office Note   Date:  07/28/2017   ID:  Scott, [name] 21-Dec-1970, MRN [id number]  PCP:  Scott Bark, FNP  Cardiologist: Dr. Katrinka [name]     No chief complaint on file.     History of Present Illness: Scott [name] is a 47 y.o. male who presents for ***  Pt was seen by Dr. Katrinka [name] 11/08/16 for chest pain,  He does have a history of recurrent pulmonary emboli diagnosed in 2013. A repeat scan done in 2017 did not demonstrate evidence of emboli. Neither study demonstrated any coronary calcifications Also HTN, DM, Sleep apnea unable to buy CPAP.   Stress myoview was ordered, normal low risk study.  Ef 58%.   Lipids well controlled with LDL of 57, HDL low at 32.    Past Medical History:  Diagnosis Date  . Carpal tunnel syndrome   . Diabetes mellitus   . DVT (deep venous thrombosis) (HCC)   . Hypertension   . Obesity   . Shoulder pain, bilateral   . Sleep apnea    was fitted for mask and never got machine - too costly    Past Surgical History:  Procedure Laterality Date  . CARPAL TUNNEL RELEASE Right 12/04/2015   Procedure: RIGHT CARPAL TUNNEL RELEASE;  Surgeon: Scott Loa, MD;  Location: [facility];  Service: Orthopedics;  Laterality: Right;  RIGHT CARPAL TUNNEL RELEASE  . CARPAL TUNNEL RELEASE Right 12/2015   [facility]  . CARPAL TUNNEL RELEASE Left 04/01/2016   Procedure: LEFT CARPAL TUNNEL RELEASE;  Surgeon: Scott Loa, MD;  Location:  SURGERY CENTER;  Service: Orthopedics;  Laterality: Left;  . right hand surgery    . ROTATOR CUFF REPAIR       Current Outpatient Medications  Medication Sig Dispense Refill  . amLODipine (NORVASC) 10 MG tablet Take 1 tablet (10 mg total) by mouth daily. 90 tablet 3  . aspirin EC 81 MG tablet Take 1 tablet (81 mg total) by mouth daily. 90 tablet 3  . atorvastatin (LIPITOR) 20 MG tablet Take 1 tablet (20 mg total) by mouth daily. 90 tablet 3  . azithromycin (ZITHROMAX) 250 MG tablet Take 2  today then 1 daily 6 tablet 0  . fexofenadine (ALLEGRA) 180 MG tablet Take 180 mg by mouth daily as needed for allergies or rhinitis.    . fluticasone (FLONASE) 50 MCG/ACT nasal spray SPRAY TWO SPRAYS INTO BOTH NOSTRILS  DAILY 16 g 5  . glucose blood (ONE TOUCH ULTRA TEST) test strip Use as instructed 100 each 1  . ibuprofen (ADVIL,MOTRIN) 600 MG tablet Take 1 tablet (600 mg total) by mouth every 8 (eight) hours as needed (Take with food.). 30 tablet 1  . INVOKANA 300 MG TABS tablet Take 1 tablet (300 mg total) by mouth daily. 30 tablet 3  . Linagliptin-metFORMIN HCl (JENTADUETO) 2.08-998 MG TABS Take 1 tablet by mouth 2 (two) times daily. 180 tablet 3  . lisinopril (PRINIVIL,ZESTRIL) 20 MG tablet Take 1 tablet (20 mg total) by mouth daily. 90 tablet 3  . naproxen (NAPROSYN) 500 MG tablet TAKE ONE TABLET BY MOUTH TWICE A DAY  WITH MEALS FOR 10 DAYS. THEN TAKE ONE TABLET BY MOUTH ONCE A DAY WITH MEAL AS NEEDED. (Patient not taking: Reported on 06/29/2017) 60 tablet 0  . omeprazole (PRILOSEC) 20 MG capsule Take 1 capsule (20 mg total) by mouth daily as needed (heartburn). 30 capsule 3   No current facility-administered medications for this visit.  Allergies:   Patient has no known allergies.    Social History:  The patient  reports that he has never smoked. He has never used smokeless tobacco. He reports that he does not drink alcohol or use drugs.   Family History:  The patient's family history includes Diabetes in his other; Hyperlipidemia in his other; Hypertension in his mother and other; Kidney disease in his father.    ROS:  General:no colds or fevers, no weight changes Skin:no rashes or ulcers HEENT:no blurred vision, no congestion CV:see HPI PUL:see HPI GI:no diarrhea constipation or melena, no indigestion GU:no hematuria, no dysuria MS:no joint pain, no claudication Neuro:no syncope, no lightheadedness Endo:no diabetes, no thyroid disease  Wt Readings from Last 3 Encounters:   07/19/17 230 lb (104.3 kg)  06/29/17 237 lb 12.8 oz (107.9 kg)  06/23/17 231 lb (104.8 kg)     PHYSICAL EXAM: VS:  There were no vitals taken for this visit. , BMI There is no height or weight on file to calculate BMI. General:Pleasant affect, NAD Skin:Warm and dry, brisk capillary refill HEENT:normocephalic, sclera clear, mucus membranes moist Neck:supple, no JVD, no bruits  Heart:S1S2 RRR without murmur, gallup, rub or click Lungs:clear without rales, rhonchi, or wheezes UJW:JXBJAbd:soft, non tender, + BS, do not palpate liver spleen or masses Ext:no lower ext edema, 2+ pedal pulses, 2+ radial pulses Neuro:alert and oriented, MAE, follows commands, + facial symmetry    EKG:  EKG is ordered today. The ekg ordered today demonstrates ***   Recent Labs: 09/13/2016: Platelets 195 11/05/2016: Hemoglobin 15.5 06/29/2017: ALT 48; BUN 17; Creatinine, Ser 0.94; Potassium 4.1; Sodium 140    Lipid Panel    Component Value Date/Time   CHOL 108 06/29/2017 1705   TRIG 94 06/29/2017 1705   HDL 32 (L) 06/29/2017 1705   CHOLHDL 3.4 06/29/2017 1705   CHOLHDL 4.3 05/28/2016 0929   VLDL 22 05/28/2016 0929   LDLCALC 57 06/29/2017 1705       Other studies Reviewed: Additional studies/ records that were reviewed today include: ***. Echocardiography 08/03/2016: Study Conclusions  - Left ventricle: The cavity size was normal. There was mild concentric hypertrophy. Systolic function was normal. The estimated ejection fraction was in the range of 60% to 65%. Wall motion was normal; there were no regional wall motion abnormalities. Left ventricular diastolic function parameters were normal. - Aortic valve: Trileaflet; normal thickness leaflets. Transvalvular velocity was within the normal range. There was no stenosis. There was no regurgitation. - Aortic root: The aortic root was normal in size. - Mitral valve: There was no regurgitation. - Right ventricle: The cavity size was  normal. Wall thickness was normal. Systolic function was normal. - Tricuspid valve: There was trivial regurgitation. - Pulmonic valve: There was no regurgitation. - Pulmonary arteries: The main pulmonary artery was normal-sized. Systolic pressure was within the normal range. - Inferior vena cava: The vessel was normal in size. - Pericardium, extracardiac: There was no pericardial effusion.    ASSESSMENT AND PLAN:  1.  ***   Current medicines are reviewed with the patient today.  The patient Has no concerns regarding medicines.  The following changes have been made:  See above Labs/ tests ordered today include:see above  Disposition:   FU:  see above  Signed, Nada BoozerLaura Astrid Vides, NP  07/28/2017 9:41 AM    [facility] [address], [place], [place]  [address]/ [address] [place], [place] Phone: [phone]; Fax: [phone]  [phone]

## 2017-07-28 NOTE — Telephone Encounter (Signed)
1 page, paperwork received through fax 07-28-17. °

## 2017-07-29 NOTE — Progress Notes (Signed)
This encounter was created in error - please disregard.

## 2017-08-04 ENCOUNTER — Encounter: Payer: 59 | Admitting: Family Medicine

## 2017-08-11 ENCOUNTER — Encounter: Payer: Self-pay | Admitting: Family Medicine

## 2017-08-11 ENCOUNTER — Ambulatory Visit (INDEPENDENT_AMBULATORY_CARE_PROVIDER_SITE_OTHER): Payer: 59 | Admitting: Family Medicine

## 2017-08-11 DIAGNOSIS — M722 Plantar fascial fibromatosis: Secondary | ICD-10-CM | POA: Diagnosis not present

## 2017-08-13 ENCOUNTER — Encounter: Payer: Self-pay | Admitting: Family Medicine

## 2017-08-13 NOTE — Progress Notes (Signed)
PCP: Lizbeth Bark, FNP  Subjective:   HPI: Patient is a 47 y.o. male here for custom orthotics.  1/24: Patient reports for just over a month he's had plantar left heel pain. Pain 7/10 at rest, up to 10/10 with pressure on foot. Worse when going from sitting to standing, sharp. Doing some stretching and using arch binder without much benefit. Not taking any medicine for this. No skin changes, numbness.  11/12/16: Patient reports his arch pain started to come back about 1 1/2 months ago. Pain level is 4/10 back at plantar heel but up to 9/10 and sharp at worst with a lot of walking. Doing home stretches, exercises, inserts all which help some. Arch binders worsened pain so not using. No skin changes, numbness.  06/23/17: Patient reports he did well following last visit until last several weeks. Pain plantar left heel is sharp, up to 10/10. Worse with walking. Doing home exercises some but not every day. A pad placed in shoes has helped. Arch binders were too tight. No skin changes, numbness.  3/19: Patient returns today for custom orthotics. He is doing well after injection for his left plantar fasciitis without pain currently. No skin changes, numbness.  4/11: Patient returns for a pair of orthotics for his dress shoes. Currently not having pain.  Past Medical History:  Diagnosis Date  . Carpal tunnel syndrome   . Diabetes mellitus   . DVT (deep venous thrombosis) (HCC)   . Hypertension   . Obesity   . Shoulder pain, bilateral   . Sleep apnea    was fitted for mask and never got machine - too costly    Current Outpatient Medications on File Prior to Visit  Medication Sig Dispense Refill  . amLODipine (NORVASC) 10 MG tablet Take 1 tablet (10 mg total) by mouth daily. 90 tablet 3  . aspirin EC 81 MG tablet Take 1 tablet (81 mg total) by mouth daily. 90 tablet 3  . atorvastatin (LIPITOR) 20 MG tablet Take 1 tablet (20 mg total) by mouth daily. 90 tablet 3  .  azithromycin (ZITHROMAX) 250 MG tablet Take 2 today then 1 daily 6 tablet 0  . fexofenadine (ALLEGRA) 180 MG tablet Take 180 mg by mouth daily as needed for allergies or rhinitis.    . fluticasone (FLONASE) 50 MCG/ACT nasal spray SPRAY TWO SPRAYS INTO BOTH NOSTRILS  DAILY 16 g 5  . glucose blood (ONE TOUCH ULTRA TEST) test strip Use as instructed 100 each 1  . ibuprofen (ADVIL,MOTRIN) 600 MG tablet Take 1 tablet (600 mg total) by mouth every 8 (eight) hours as needed (Take with food.). 30 tablet 1  . INVOKANA 300 MG TABS tablet Take 1 tablet (300 mg total) by mouth daily. 30 tablet 3  . Linagliptin-metFORMIN HCl (JENTADUETO) 2.08-998 MG TABS Take 1 tablet by mouth 2 (two) times daily. 180 tablet 3  . lisinopril (PRINIVIL,ZESTRIL) 20 MG tablet Take 1 tablet (20 mg total) by mouth daily. 90 tablet 3  . naproxen (NAPROSYN) 500 MG tablet TAKE ONE TABLET BY MOUTH TWICE A DAY  WITH MEALS FOR 10 DAYS. THEN TAKE ONE TABLET BY MOUTH ONCE A DAY WITH MEAL AS NEEDED. (Patient not taking: Reported on 06/29/2017) 60 tablet 0  . omeprazole (PRILOSEC) 20 MG capsule Take 1 capsule (20 mg total) by mouth daily as needed (heartburn). 30 capsule 3   No current facility-administered medications on file prior to visit.     Past Surgical History:  Procedure Laterality Date  .  CARPAL TUNNEL RELEASE Right 12/04/2015   Procedure: RIGHT CARPAL TUNNEL RELEASE;  Surgeon: Betha Loa, MD;  Location: [facility];  Service: Orthopedics;  Laterality: Right;  RIGHT CARPAL TUNNEL RELEASE  . CARPAL TUNNEL RELEASE Right 12/2015   [facility]  . CARPAL TUNNEL RELEASE Left 04/01/2016   Procedure: LEFT CARPAL TUNNEL RELEASE;  Surgeon: Betha Loa, MD;  Location: [facility];  Service: Orthopedics;  Laterality: Left;  . right hand surgery    . ROTATOR CUFF REPAIR      No Known Allergies  Social History   Socioeconomic History  . Marital status: Married    Spouse name: Not on file  . Number of children: Not  on file  . Years of education: 12+  . Highest education level: Not on file  Occupational History  . Occupation: real Insurance account manager  Social Needs  . Financial resource strain: Not on file  . Food insecurity:    Worry: Not on file    Inability: Not on file  . Transportation needs:    Medical: Not on file    Non-medical: Not on file  Tobacco Use  . Smoking status: Never Smoker  . Smokeless tobacco: Never Used  Substance and Sexual Activity  . Alcohol use: No    Alcohol/week: 0.0 oz  . Drug use: No  . Sexual activity: Not on file  Lifestyle  . Physical activity:    Days per week: Not on file    Minutes per session: Not on file  . Stress: Not on file  Relationships  . Social connections:    Talks on phone: Not on file    Gets together: Not on file    Attends religious service: Not on file    Active member of club or organization: Not on file    Attends meetings of clubs or organizations: Not on file    Relationship status: Not on file  . Intimate partner violence:    Fear of current or ex partner: Not on file    Emotionally abused: Not on file    Physically abused: Not on file    Forced sexual activity: Not on file  Other Topics Concern  . Not on file  Social History Narrative   Lives at home.   Right-handed.   Occasional caffeine use.    Family History  Problem Relation Age of Onset  . Hypertension Mother   . Kidney disease Father   . Hypertension Other   . Hyperlipidemia Other   . Diabetes Other     BP (!) 154/101   Pulse 80   Ht 5\' 6"  (1.676 m)   Wt 230 lb (104.3 kg)   BMI 37.12 kg/m   Review of Systems: See HPI above.     Objective:  Physical Exam:  Gen: NAD, comfortable in exam room.  Remainder of exam not repeated from last visit. Left foot/ankle: Mod pronation long arch.  No callus over transverse arch, collapse.  No other gross deformity, swelling, ecchymoses No hallux rigidus or valgus. FROM with 5/5 strength. No TTP NV intact  distally.  Right foot/ankle: Mod pronation long arch.  No callus over transverse arch, collapse.  No other gross deformity, swelling, ecchymoses No hallux rigidus or valgus. FROM with 5/5 strength. No TTP NV intact distally.   Assessment & Plan:  1. Left plantar fasciitis - Doing well with custom orthotics but would like some for his dress shoes.  Doing well following injection too.  Arch binders,  home exercises.  Patient was fitted for a : standard, cushioned, semi-rigid orthotic. The orthotic was heated and afterward the patient stood on the orthotic blank positioned on the orthotic stand. The patient was positioned in subtalar neutral position and 10 degrees of ankle dorsiflexion in a weight bearing stance. After completion of molding, a stable base was applied to the orthotic blank. The blank was ground to a stable position for weight bearing. Size: 11 dress orthotic Base: blue med density eva Posting: none Additional orthotic padding: none  Total prep time 40 minutes > 50% of which spent on counseling, answering questions.

## 2017-08-13 NOTE — Assessment & Plan Note (Signed)
Doing well with custom orthotics but would like some for his dress shoes.  Doing well following injection too.  Arch binders, home exercises.  Patient was fitted for a : standard, cushioned, semi-rigid orthotic. The orthotic was heated and afterward the patient stood on the orthotic blank positioned on the orthotic stand. The patient was positioned in subtalar neutral position and 10 degrees of ankle dorsiflexion in a weight bearing stance. After completion of molding, a stable base was applied to the orthotic blank. The blank was ground to a stable position for weight bearing. Size: 11 dress orthotic Base: blue med density eva Posting: none Additional orthotic padding: none  Total prep time 40 minutes > 50% of which spent on counseling, answering questions.

## 2017-08-30 ENCOUNTER — Ambulatory Visit (INDEPENDENT_AMBULATORY_CARE_PROVIDER_SITE_OTHER): Payer: 59 | Admitting: Nurse Practitioner

## 2017-08-30 ENCOUNTER — Encounter: Payer: Self-pay | Admitting: Nurse Practitioner

## 2017-08-30 VITALS — BP 140/80 | HR 75 | Ht 66.0 in | Wt 232.1 lb

## 2017-08-30 DIAGNOSIS — I10 Essential (primary) hypertension: Secondary | ICD-10-CM | POA: Diagnosis not present

## 2017-08-30 DIAGNOSIS — I2699 Other pulmonary embolism without acute cor pulmonale: Secondary | ICD-10-CM

## 2017-08-30 DIAGNOSIS — R0789 Other chest pain: Secondary | ICD-10-CM

## 2017-08-30 LAB — BASIC METABOLIC PANEL
BUN/Creatinine Ratio: 16 (ref 9–20)
BUN: 17 mg/dL (ref 6–24)
CO2: 23 mmol/L (ref 20–29)
Calcium: 9.3 mg/dL (ref 8.7–10.2)
Chloride: 102 mmol/L (ref 96–106)
Creatinine, Ser: 1.04 mg/dL (ref 0.76–1.27)
GFR calc Af Amer: 98 mL/min/{1.73_m2} (ref >59)
GFR calc non Af Amer: 85 mL/min/{1.73_m2} (ref >59)
Glucose: 139 mg/dL — ABNORMAL HIGH (ref 65–99)
Potassium: 4.6 mmol/L (ref 3.5–5.2)
Sodium: 141 mmol/L (ref 134–144)

## 2017-08-30 MED ORDER — METOPROLOL TARTRATE 50 MG PO TABS: 50.0000 mg | ORAL_TABLET | Freq: Two times a day (BID) | ORAL | 0 refills | Status: DC

## 2017-08-30 MED ORDER — METOPROLOL TARTRATE 50 MG PO TABS: 50.0000 mg | ORAL_TABLET | ORAL | 0 refills | Status: DC

## 2017-08-30 NOTE — Patient Instructions (Addendum)
We will be checking the following labs today - BMET   Medication Instructions:    Continue with your current medicines.   I am sending in a RX for a dose of Lopressor to take the night prior to your scan. Bring the second tablet with you on the day of your scan.     Testing/Procedures To Be Arranged:  Coronary CT   Follow-Up:   Will see how your test turns out and then decide about follow up.     Other Special Instructions:  Please arrive at the [facility] main entrance of [facility] at to be determined AM (30-45 minutes prior to test start time)  [facility] [address] [place], [place] [address] [phone]  Proceed to the [facility] Radiology Department (First Floor).  Please follow these instructions carefully (unless otherwise directed):  Hold all erectile dysfunction medications at least 48 hours prior to test.  On the Night Before the Test: Drink plenty of water. Do not consume any caffeinated/decaffeinated beverages or chocolate 12 hours prior to your test. Do not take any antihistamines 12 hours prior to your test. If you take Metformin do not take 24 hours prior to test.   On the Day of the Test: Drink plenty of water. Do not drink any water within one hour of the test. Do not eat any food 4 hours prior to the test. You may take your regular medications prior to the test. IF NOT ON A BETA BLOCKER - Take 50 mg of lopressor (metoprolol) the night prior to  the test.Bring second pill with you the day of test.  Hold Invokana the day of test.   After the Test: Drink plenty of water. After receiving IV contrast, you may experience a mild flushed feeling. This is normal. On occasion, you may experience a mild rash up to 24 hours after the test. This is not dangerous. If this occurs, you can take Benadryl 25 mg and increase your fluid intake. If you experience trouble breathing, this can be serious. If it is severe call 911  IMMEDIATELY. If it is mild, please call our office. If you take any of these medications: Glipizide/Metformin, Avandament, Glucavance, please do not take 48 hours after completing test.      If you need a refill on your cardiac medications before your next appointment, please call your pharmacy.   Call the [facility] office at [phone] if you have any questions, problems or concerns.

## 2017-08-30 NOTE — Progress Notes (Signed)
CARDIOLOGY OFFICE NOTE  Date:  08/30/2017    Scott [name] Date of Birth: 25-Oct-1970 Medical Record #[id number]  PCP:  Lizbeth Bark, FNP  Cardiologist:  Kyra Manges    Chief Complaint  Patient presents with  . Chest Pain    Work in visit - seen for Dr. Katrinka Blazing    History of Present Illness: Scott [name] is a 47 y.o. male who presents today for a follow up visit. Seen for Dr. Katrinka Blazing.   He has a history of HTN.  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 A repeat scan done in 2017 did not demonstrate evidence of emboli. Neither study demonstrated any coronary calcifications.   Seen last July by Dr. Katrinka Blazing for chest pain - noted to be of several varieties -  Comes in today. Here alone. He notes that he has continued to have intermittent chest pain - he describes this as a tightening feeling. No radiation. No associated symptoms. Not exertional. Not short of breath. Brought on by stress/anxiety. Also occurs after eating as well. Does not occur with walking/going up stairs. Has had long standing HTN/DM. No smoking. Here because of EKG from the PCP - looks better here today. Notes that he has basically felt this way since he was here last year. He prefers a noninvasive approach. He is planning on starting a weight loss/exercise program - has a challenge with his son to lose 20 pounds.   Past Medical History:  Diagnosis Date  . Carpal tunnel syndrome   . Diabetes mellitus   . DVT (deep venous thrombosis) (HCC)   . Hypertension   . Obesity   . Shoulder pain, bilateral   . Sleep apnea    was fitted for mask and never got machine - too costly    Past Surgical History:  Procedure Laterality Date  . CARPAL TUNNEL RELEASE Right 12/04/2015   Procedure: RIGHT CARPAL TUNNEL RELEASE;  Surgeon: Betha Loa, MD;  Location: [facility];  Service: Orthopedics;  Laterality: Right;  RIGHT CARPAL TUNNEL RELEASE  . CARPAL TUNNEL  RELEASE Right 12/2015   [facility]  . CARPAL TUNNEL RELEASE Left 04/01/2016   Procedure: LEFT CARPAL TUNNEL RELEASE;  Surgeon: Betha Loa, MD;  Location: [facility];  Service: Orthopedics;  Laterality: Left;  . right hand surgery    . ROTATOR CUFF REPAIR       Medications: Current Meds  Medication Sig  . amLODipine (NORVASC) 10 MG tablet Take 1 tablet (10 mg total) by mouth daily. (Patient taking differently: Take 1 tablet (10 mg total) by mouth daily.)  . aspirin EC 81 MG tablet Take 1 tablet (81 mg total) by mouth daily.  [name] atorvastatin (LIPITOR) 20 MG tablet Take 1 tablet (20 mg total) by mouth daily. (Patient taking differently: Take 1 tablet (20 mg total) by mouth daily.)  . fluticasone (FLONASE) 50 MCG/ACT nasal spray SPRAY TWO SPRAYS INTO BOTH NOSTRILS  DAILY  . glucose blood (ONE TOUCH ULTRA TEST) test strip Use as instructed  . ibuprofen (ADVIL,MOTRIN) 600 MG tablet Take 1 tablet (600 mg total) by mouth every 8 (eight) hours as needed (Take with food.).  [name] Linagliptin-metFORMIN HCl (JENTADUETO) 2.08-998 MG TABS Take 1 tablet by mouth 2 (two) times daily.  . Linagliptin-metFORMIN HCl (JENTADUETO) 2.08-998 MG TABS Take by mouth.  [name] lisinopril (PRINIVIL,ZESTRIL) 20 MG tablet Take 1 tablet (20 mg total) by mouth daily. (Patient taking differently: Take  1 tablet (20 mg total) by mouth daily.)  . omeprazole (PRILOSEC) 20 MG capsule Take 1 capsule (20 mg total) by mouth daily as needed (heartburn).  . [DISCONTINUED] fexofenadine (ALLEGRA) 180 MG tablet Take 180 mg by mouth daily as needed for allergies or rhinitis.      Allergies: No Known Allergies  Social History: The patient  reports that he has never smoked. He has never used smokeless tobacco. He reports that he does not drink alcohol or use drugs.   Family History: The patient's family history includes Diabetes in his other; Hyperlipidemia in his other; Hypertension in his mother and other; Kidney disease in his  father.   Review of Systems: Please see the history of present illness.   Otherwise, the review of systems is positive for none.   All other systems are reviewed and negative.   Physical Exam: VS:  BP 140/80 (BP Location: Left Arm, Patient Position: Sitting, Cuff Size: Large)   Pulse 75   Ht  (1.676 m)   Wt 232 lb 1.9 oz (105.3 kg)   BMI 37.47 kg/m  .  BMI Body mass index is 37.47 kg/m.  Wt Readings from Last 3 Encounters:  08/30/17 232 lb 1.9 oz (105.3 kg)  08/11/17 230 lb (104.3 kg)  07/28/17 232 lb (105.2 kg)    General: Pleasant. Obese. Well developed, well nourished and in no acute distress.   HEENT: Normal.  Neck: Supple, no JVD, carotid bruits, or masses noted.  Cardiac: Regular rate and rhythm. No murmurs, rubs, or gallops. No edema.  Respiratory:  Lungs are clear to auscultation bilaterally with normal work of breathing.  GI: Soft and nontender.  MS: No deformity or atrophy. Gait and ROM intact.  Skin: Warm and dry. Color is normal.  Neuro:  Strength and sensation are intact and no gross focal deficits noted.  Psych: Alert, appropriate and with normal affect.   LABORATORY DATA:  EKG:  EKG is ordered today. This demonstrates NSR with improved nonspecific T wave changes - looks better today.  Lab Results  Component Value Date   WBC 4.6 11/05/2016   HGB 15.5 11/05/2016   HCT 46.0 11/05/2016   PLT 195 09/13/2016   GLUCOSE 200 (H) 06/29/2017   CHOL 108 06/29/2017   TRIG 94 06/29/2017   HDL 32 (L) 06/29/2017   LDLCALC 57 06/29/2017   ALT 48 (H) 06/29/2017   AST 22 06/29/2017   NA 140 06/29/2017   K 4.1 06/29/2017   CL 98 06/29/2017   CREATININE 0.94 06/29/2017   BUN 17 06/29/2017   CO2 26 06/29/2017   TSH 1.085 01/15/2014   PSA 0.55 01/15/2014   INR 1.22 09/05/2011   HGBA1C 8.1 06/29/2017   MICROALBUR 20.9 05/28/2016     BNP (last 3 results) No results for input(s): BNP in the last 8760 hours.  ProBNP (last 3 results) No results for input(s):  PROBNP in the last 8760 hours.   Other Studies Reviewed Today:  Myoview Study Highlights 10/2016   Nuclear stress EF: 58%.  Blood pressure demonstrated a normal response to exercise.  There was no ST segment deviation noted during stress.  The study is normal.  This is a low risk study.  The left ventricular ejection fraction is normal (55-65%).      Echocardiography 08/03/2016: Study Conclusions  - Left ventricle: The cavity size was normal. There was mild concentric hypertrophy. Systolic function was normal. The estimated ejection fraction was in the range of 60% to  65%. Wall motion was normal; there were no regional wall motion abnormalities. Left ventricular diastolic function parameters were normal. - Aortic valve: Trileaflet; normal thickness leaflets. Transvalvular velocity was within the normal range. There was no stenosis. There was no regurgitation. - Aortic root: The aortic root was normal in size. - Mitral valve: There was no regurgitation. - Right ventricle: The cavity size was normal. Wall thickness was normal. Systolic function was normal. - Tricuspid valve: There was trivial regurgitation. - Pulmonic valve: There was no regurgitation. - Pulmonary arteries: The main pulmonary artery was normal-sized. Systolic pressure was within the normal range. - Inferior vena cava: The vessel was normal in size. - Pericardium, extracardiac: There was no pericardial effusion.   Assessment & Plan:  1. Chest pain - atypical - not exertional - notes more brought on by stress and after eating. He does have multiple CV risk factors - will arrange for coronary CT - will use Lopressor x 1 the night prior. BMET today. Further disposition to follow.   2. HTN - fair control. He is going to start checking at home.   3. History of PE - No evidence of right heart strain or pulmonary hypertension on recent echocardiogram. Not short of breath. Current  presentation does not sound like related to PE.   4. Chronic diastolic HF - needs good BP control. Needs weight loss. Not short of breath.    Current medicines are reviewed with the patient today.  The patient does not have concerns regarding medicines other than what has been noted above.  The following changes have been made:  See above.  Labs/ tests ordered today include:    Orders Placed This Encounter  Procedures  . EKG 12-Lead     Disposition:   Further disposition pending.   Patient is agreeable to this plan and will call if any problems develop in the interim.   SignedNorma Fredrickson, NP  08/30/2017 10:17 AM  [facility] [address] [place], [place]  [address] Phone: [phone] Fax: [phone]

## 2017-08-31 ENCOUNTER — Ambulatory Visit: Payer: 59 | Admitting: Family Medicine

## 2017-09-02 NOTE — Progress Notes (Signed)
Great approach. Thanks

## 2017-11-21 ENCOUNTER — Other Ambulatory Visit: Payer: Self-pay

## 2017-11-21 DIAGNOSIS — Z760 Encounter for issue of repeat prescription: Secondary | ICD-10-CM

## 2017-11-21 MED ORDER — ASPIRIN EC 81 MG PO TBEC: 81.0000 mg | DELAYED_RELEASE_TABLET | Freq: Every day | ORAL | 1 refills | Status: DC

## 2017-11-24 ENCOUNTER — Ambulatory Visit (INDEPENDENT_AMBULATORY_CARE_PROVIDER_SITE_OTHER): Payer: 59 | Admitting: Family Medicine

## 2017-11-24 ENCOUNTER — Encounter: Payer: Self-pay | Admitting: Family Medicine

## 2017-11-24 DIAGNOSIS — M25562 Pain in left knee: Secondary | ICD-10-CM

## 2017-11-24 MED ORDER — METHYLPREDNISOLONE ACETATE 40 MG/ML IJ SUSP
40.0000 mg | Freq: Once | INTRAMUSCULAR | Status: AC
  Administered 2017-11-24: 40 mg via INTRA_ARTICULAR

## 2017-11-24 NOTE — Patient Instructions (Signed)
Your pain is due to arthritis. These are the different medications you can take for this: Tylenol 500mg 1-2 tabs three times a day for pain. Capsaicin, aspercreme, or biofreeze topically up to four times a day may also help with pain. Some supplements that may help for arthritis: Boswellia extract, curcumin, pycnogenol Aleve 1-2 tabs twice a day with food Cortisone injections are an option - you were given this today If cortisone injections do not help, there are different types of shots that may help but they take longer to take effect. It's important that you continue to stay active. Straight leg raises, knee extensions 3 sets of 10 once a day (add ankle weight if these become too easy). Consider physical therapy to strengthen muscles around the joint that hurts to take pressure off of the joint itself. Shoe inserts with good arch support may be helpful. Heat or ice 15 minutes at a time 3-4 times a day as needed to help with pain. Water aerobics and cycling with low resistance are the best two types of exercise for arthritis though any exercise is ok as long as it doesn't worsen the pain. Follow up with me in 1 month.  

## 2017-11-27 ENCOUNTER — Encounter: Payer: Self-pay | Admitting: Family Medicine

## 2017-11-27 DIAGNOSIS — M25562 Pain in left knee: Secondary | ICD-10-CM | POA: Insufficient documentation

## 2017-11-27 NOTE — Assessment & Plan Note (Signed)
patient's exam is reassuring.  Consistent with flare of medial arthritis.  Cortisone injection given today.  Tylenol, topical medications, supplements reviewed as well as aleve.  Shown home exercises to do daily.  Heat/ice.  Consider physical therapy, imaging if not improving as expected.  F/u in 1 month.  After informed written consent timeout was performed, patient was seated on exam table. Left knee was prepped with alcohol swab and utilizing anteromedial approach, patient's left knee was injected intraarticularly with 3:1 bupivicaine: depomedrol. Patient tolerated the procedure well without immediate complications.

## 2017-11-27 NOTE — Progress Notes (Signed)
PCP: Lizbeth BarkHairston, Mandesia R, FNP  Subjective:   HPI: Patient is a 47 y.o. male here for left knee pain.  Patient reports about 2 1/2 weeks he started to have left knee pain. Didn't injure himself but felt like a tweak medial side. Pain level 6/10 and sharp medially. Has tried icing, tylenol, bracing. Limping. Some swelling. No skin changes, numbness.  Past Medical History:  Diagnosis Date  . Carpal tunnel syndrome   . Diabetes mellitus   . DVT (deep venous thrombosis) (HCC)   . Hypertension   . Obesity   . Shoulder pain, bilateral   . Sleep apnea    was fitted for mask and never got machine - too costly    Current Outpatient Medications on File Prior to Visit  Medication Sig Dispense Refill  . amLODipine (NORVASC) 10 MG tablet Take 1 tablet (10 mg total) by mouth daily. (Patient taking differently: Take 1 tablet (10 mg total) by mouth daily.) 90 tablet 3  . aspirin EC 81 MG tablet Take 1 tablet (81 mg total) by mouth daily. 90 tablet 1  . atorvastatin (LIPITOR) 20 MG tablet Take 1 tablet (20 mg total) by mouth daily. (Patient taking differently: Take 1 tablet (20 mg total) by mouth daily.) 90 tablet 3  . fluticasone (FLONASE) 50 MCG/ACT nasal spray SPRAY TWO SPRAYS INTO BOTH NOSTRILS  DAILY 16 g 5  . glucose blood (ONE TOUCH ULTRA TEST) test strip Use as instructed 100 each 1  . ibuprofen (ADVIL,MOTRIN) 600 MG tablet Take 1 tablet (600 mg total) by mouth every 8 (eight) hours as needed (Take with food.). 30 tablet 1  . INVOKANA 300 MG TABS tablet Take 1 tablet (300 mg total) by mouth daily. (Patient not taking: Reported on 08/30/2017) 30 tablet 3  . Linagliptin-metFORMIN HCl (JENTADUETO) 2.08-998 MG TABS Take 1 tablet by mouth 2 (two) times daily. 180 tablet 3  . lisinopril (PRINIVIL,ZESTRIL) 20 MG tablet Take 1 tablet (20 mg total) by mouth daily. (Patient taking differently: Take 1 tablet (20 mg total) by mouth daily.) 90 tablet 3  . metoprolol tartrate (LOPRESSOR) 50 MG tablet  Take 1 tablet (50 mg total) by mouth as directed. Take one pill the night prior to your scan - bring the second pill with you on the day of your scan. 2 tablet 0  . omeprazole (PRILOSEC) 20 MG capsule Take 1 capsule (20 mg total) by mouth daily as needed (heartburn). 30 capsule 3   No current facility-administered medications on file prior to visit.     Past Surgical History:  Procedure Laterality Date  . CARPAL TUNNEL RELEASE Right 12/04/2015   Procedure: RIGHT CARPAL TUNNEL RELEASE;  Surgeon: Betha LoaKevin Kuzma, MD;  Location: [facility];  Service: Orthopedics;  Laterality: Right;  RIGHT CARPAL TUNNEL RELEASE  . CARPAL TUNNEL RELEASE Right 12/2015   [facility]  . CARPAL TUNNEL RELEASE Left 04/01/2016   Procedure: LEFT CARPAL TUNNEL RELEASE;  Surgeon: Betha LoaKevin Kuzma, MD;  Location: [facility];  Service: Orthopedics;  Laterality: Left;  . right hand surgery    . ROTATOR CUFF REPAIR      No Known Allergies  Social History   Socioeconomic History  . Marital status: Married    Spouse name: Not on file  . Number of children: Not on file  . Years of education: 12+  . Highest education level: Not on file  Occupational History  . Occupation: real Insurance account managerestate agent  Social Needs  . Financial resource strain:  Not on file  . Food insecurity:    Worry: Not on file    Inability: Not on file  . Transportation needs:    Medical: Not on file    Non-medical: Not on file  Tobacco Use  . Smoking status: Never Smoker  . Smokeless tobacco: Never Used  Substance and Sexual Activity  . Alcohol use: No    Alcohol/week: 0.0 oz  . Drug use: No  . Sexual activity: Not on file  Lifestyle  . Physical activity:    Days per week: Not on file    Minutes per session: Not on file  . Stress: Not on file  Relationships  . Social connections:    Talks on phone: Not on file    Gets together: Not on file    Attends religious service: Not on file    Active member of club or organization: Not  on file    Attends meetings of clubs or organizations: Not on file    Relationship status: Not on file  . Intimate partner violence:    Fear of current or ex partner: Not on file    Emotionally abused: Not on file    Physically abused: Not on file    Forced sexual activity: Not on file  Other Topics Concern  . Not on file  Social History Narrative   Lives at home.   Right-handed.   Occasional caffeine use.    Family History  Problem Relation Age of Onset  . Hypertension Mother   . Kidney disease Father   . Hypertension Other   . Hyperlipidemia Other   . Diabetes Other     BP 136/89   Pulse 98   Ht 5' 6" (1.676 m)   Wt 235 lb (106.6 kg)   BMI 37.93 kg/m   Review of Systems: See HPI above.     Objective:  Physical Exam:  Gen: NAD, comfortable in exam room  Left knee: Minimal effusion.  No other gross deformity, ecchymoses. TTP medial joint line. FROM with 5/5 strength flexion and extension. Negative ant/post drawers. Negative valgus/varus testing. Negative lachmanns. Negative mcmurrays, apleys, patellar apprehension. NV intact distally.  Right knee: No deformity. FROM with 5/5 strength. No tenderness to palpation. NVI distally.    Assessment & Plan:  1. Left knee pain - patient's exam is reassuring.  Consistent with flare of medial arthritis.  Cortisone injection given today.  Tylenol, topical medications, supplements reviewed as well as aleve.  Shown home exercises to do daily.  Heat/ice.  Consider physical therapy, imaging if not improving as expected.  F/u in 1 month.  After informed written consent timeout was performed, patient was seated on exam table. Left knee was prepped with alcohol swab and utilizing anteromedial approach, patient's left knee was injected intraarticularly with 3:1 bupivicaine: depomedrol. Patient tolerated the procedure well without immediate complications. 

## 2017-11-28 ENCOUNTER — Telehealth: Payer: Self-pay | Admitting: Family Medicine

## 2017-11-28 DIAGNOSIS — M25562 Pain in left knee: Secondary | ICD-10-CM

## 2017-11-28 NOTE — Telephone Encounter (Signed)
Patient called regarding his left knee. States his knee felt better for about a day and a half after his visit on 7/25, but he is back to having pain. States it is the worst when he goes from sitting down to standing up.

## 2017-11-28 NOTE — Telephone Encounter (Signed)
I'd ask him to still give it a few days, let us know how he's doing at the end of the week.  This suggests the numbing medicine has helped but the cortisone may still not have kicked in yet.

## 2017-11-29 NOTE — Telephone Encounter (Signed)
Patient was informed and will call back at the end of the week

## 2017-12-02 NOTE — Telephone Encounter (Signed)
Patient is ok with further imaging, informed that orders for the x-ray will be placed and he can stop by anytime

## 2017-12-02 NOTE — Telephone Encounter (Signed)
Ok I'd suggest we go ahead with imaging assuming his insurance approves this (sometimes they require 6 weeks of conservative treatment first) - meaning x-rays plus MRI.  If insurance denies he may have to do anti-inflammatories plus physical therapy.    If he's ok with the imaging, we can put x-rays order in chart and he can get them at his convenience - we'd call him with results and if normal as expected would go ahead with MRI.

## 2017-12-02 NOTE — Telephone Encounter (Signed)
Order placed

## 2017-12-02 NOTE — Telephone Encounter (Signed)
Patient following up on cortisone injection. He states he is still in pain and hasn't felt any relief from the injection .

## 2017-12-19 ENCOUNTER — Telehealth: Payer: Self-pay | Admitting: *Deleted

## 2017-12-19 NOTE — Telephone Encounter (Signed)
lvm to see if pt still wants CTA w/FFR that was ordered in April and did not scheduled.

## 2017-12-21 NOTE — Telephone Encounter (Signed)
S/w Omar PersonSharon Ferguson at check out. Jasmine DecemberSharon Stated t/w this pt yesterday and pt still wanted to do this test. Jasmine DecemberSharon with schedule.

## 2017-12-26 ENCOUNTER — Encounter: Payer: Self-pay | Admitting: Family Medicine

## 2017-12-26 ENCOUNTER — Ambulatory Visit (INDEPENDENT_AMBULATORY_CARE_PROVIDER_SITE_OTHER): Payer: 59 | Admitting: Family Medicine

## 2017-12-26 VITALS — BP 125/83 | HR 78 | Ht 66.0 in | Wt 230.0 lb

## 2017-12-26 DIAGNOSIS — M25562 Pain in left knee: Secondary | ICD-10-CM

## 2017-12-26 DIAGNOSIS — M7052 Other bursitis of knee, left knee: Secondary | ICD-10-CM

## 2017-12-26 MED ORDER — METHYLPREDNISOLONE ACETATE 40 MG/ML IJ SUSP
40.0000 mg | Freq: Once | INTRAMUSCULAR | Status: AC
  Administered 2017-12-26: 40 mg via INTRA_ARTICULAR

## 2017-12-26 NOTE — Patient Instructions (Signed)
If by Monday you're still having pain go ahead and get the x-rays and we will call you with the results.

## 2017-12-26 NOTE — Progress Notes (Signed)
PCP: Lizbeth BarkHairston, Mandesia R, FNP  Subjective:   HPI: Patient is a 47 y.o. male here for left medial knee pain.  Patient was seen on 7/25 and an intra-articular steroid injection was performed.  Patient states she has had no relief following the steroid injection.  His pain continues to be up to 8/10 with walking.  He reports feeling stiffness after being seated and driving.  He denies any locking or giving way.  Denies any numbness or tingling.  No erythema.  He does report medial swelling at the end of the day..  Past Medical History:  Diagnosis Date  . Carpal tunnel syndrome   . Diabetes mellitus   . DVT (deep venous thrombosis) (HCC)   . Hypertension   . Obesity   . Shoulder pain, bilateral   . Sleep apnea    was fitted for mask and never got machine - too costly    Current Outpatient Medications on File Prior to Visit  Medication Sig Dispense Refill  . amLODipine (NORVASC) 10 MG tablet Take 1 tablet (10 mg total) by mouth daily. (Patient taking differently: Take 1 tablet (10 mg total) by mouth daily.) 90 tablet 3  . aspirin EC 81 MG tablet Take 1 tablet (81 mg total) by mouth daily. 90 tablet 1  . atorvastatin (LIPITOR) 20 MG tablet Take 1 tablet (20 mg total) by mouth daily. (Patient taking differently: Take 1 tablet (20 mg total) by mouth daily.) 90 tablet 3  . fluticasone (FLONASE) 50 MCG/ACT nasal spray SPRAY TWO SPRAYS INTO BOTH NOSTRILS  DAILY 16 g 5  . glucose blood (ONE TOUCH ULTRA TEST) test strip Use as instructed 100 each 1  . ibuprofen (ADVIL,MOTRIN) 600 MG tablet Take 1 tablet (600 mg total) by mouth every 8 (eight) hours as needed (Take with food.). 30 tablet 1  . INVOKANA 300 MG TABS tablet Take 1 tablet (300 mg total) by mouth daily. (Patient not taking: Reported on 08/30/2017) 30 tablet 3  . Linagliptin-metFORMIN HCl (JENTADUETO) 2.08-998 MG TABS Take 1 tablet by mouth 2 (two) times daily. 180 tablet 3  . lisinopril (PRINIVIL,ZESTRIL) 20 MG tablet Take 1 tablet (20 mg  total) by mouth daily. (Patient taking differently: Take 1 tablet (20 mg total) by mouth daily.) 90 tablet 3  . metoprolol tartrate (LOPRESSOR) 50 MG tablet Take 1 tablet (50 mg total) by mouth as directed. Take one pill the night prior to your scan - bring the second pill with you on the day of your scan. 2 tablet 0  . omeprazole (PRILOSEC) 20 MG capsule Take 1 capsule (20 mg total) by mouth daily as needed (heartburn). 30 capsule 3   No current facility-administered medications on file prior to visit.     Past Surgical History:  Procedure Laterality Date  . CARPAL TUNNEL RELEASE Right 12/04/2015   Procedure: RIGHT CARPAL TUNNEL RELEASE;  Surgeon: Betha LoaKevin Kuzma, MD;  Location: [facility];  Service: Orthopedics;  Laterality: Right;  RIGHT CARPAL TUNNEL RELEASE  . CARPAL TUNNEL RELEASE Right 12/2015   [facility]  . CARPAL TUNNEL RELEASE Left 04/01/2016   Procedure: LEFT CARPAL TUNNEL RELEASE;  Surgeon: Betha LoaKevin Kuzma, MD;  Location: [facility];  Service: Orthopedics;  Laterality: Left;  . right hand surgery    . ROTATOR CUFF REPAIR      No Known Allergies  Social History   Socioeconomic History  . Marital status: Married    Spouse name: Not on file  . Number of children:  Not on file  . Years of education: 12+  . Highest education level: Not on file  Occupational History  . Occupation: real Insurance account manager  Social Needs  . Financial resource strain: Not on file  . Food insecurity:    Worry: Not on file    Inability: Not on file  . Transportation needs:    Medical: Not on file    Non-medical: Not on file  Tobacco Use  . Smoking status: Never Smoker  . Smokeless tobacco: Never Used  Substance and Sexual Activity  . Alcohol use: No    Alcohol/week: 0.0 standard drinks  . Drug use: No  . Sexual activity: Not on file  Lifestyle  . Physical activity:    Days per week: Not on file    Minutes per session: Not on file  . Stress: Not on file  Relationships  .  Social connections:    Talks on phone: Not on file    Gets together: Not on file    Attends religious service: Not on file    Active member of club or organization: Not on file    Attends meetings of clubs or organizations: Not on file    Relationship status: Not on file  . Intimate partner violence:    Fear of current or ex partner: Not on file    Emotionally abused: Not on file    Physically abused: Not on file    Forced sexual activity: Not on file  Other Topics Concern  . Not on file  Social History Narrative   Lives at home.   Right-handed.   Occasional caffeine use.    Family History  Problem Relation Age of Onset  . Hypertension Mother   . Kidney disease Father   . Hypertension Other   . Hyperlipidemia Other   . Diabetes Other     BP 125/83   Pulse 78   Ht 5\' 6"  (1.676 m)   Wt 230 lb (104.3 kg)   BMI 37.12 kg/m   Review of Systems: See HPI above.     Objective:  Physical Exam:  Gen: awake, alert, NAD, comfortable in exam room Pulm: breathing unlabored  Left knee: - Inspection: no gross deformity.  Mild swelling medially in the area of the pes anserine bursa.  No erythema or bruising. Skin intact - Palpation: Tenderness to palpation over the pes anserine bursa and medial joint - ROM: full active ROM with flexion and extension in knee  - Strength: full strength on resisted flexion and extension - Neuro/vasc: Normal sensation - Special Tests: - LIGAMENTS: negative anterior and posterior drawer, negative Lachman's, no MCL or LCL laxity  -- MENISCUS: negative McMurray's -- PF JOINT: nml patellar mobility bilaterally.  negative patellar grind, negative patellar apprehension   Assessment & Plan:  1.  Medial right knee pain likely secondary to pes anserine bursitis.  Patient previously had no benefit with intra-articular steroid injection.  Most tenderness currently coming from the bursa.  Steroid injection performed as described below.  If patient has no  noticeable benefit from the injection today in the next week he will go for x-rays as previously planned and will follow with MRI to evaluate for meniscal pathology  Procedure performed: pes anserine bursa corticosteroid injection; palpation guided Performed by: Dalbert Garnet, DO Procedure note:  Consent obtained and verified. Time-out conducted. Noted no overlying erythema, induration, or other signs of local infection. The left pes anserine bursa was palpated for the point of maximal tenderness and  marked. The overlying skin was prepped in a sterile fashion. Topical analgesic spray: Ethyl chloride. Joint: left pes anserine bursa Needle: 25ga, 1.5" Completed without difficulty. Meds: 40mg  depomedrol, 2cc bupivicaine  Advised to call if fevers/chills, erythema, induration, drainage, or persistent bleeding.

## 2017-12-27 ENCOUNTER — Telehealth: Payer: Self-pay | Admitting: Family Medicine

## 2017-12-27 NOTE — Telephone Encounter (Signed)
Noted, thanks!

## 2017-12-27 NOTE — Telephone Encounter (Signed)
Patient was informed. He verbalized understanding and will call if there is no improvement over the next few days

## 2017-12-27 NOTE — Telephone Encounter (Signed)
Patient calling with concerns regarding injection that was given yesterday. He states he has been in a lot of pain where he couldn't sleep last night. Also states he is unable to bend his knee this morning.   Patient is asking if this is normal with that type of injection.

## 2017-12-27 NOTE — Telephone Encounter (Signed)
It sounds like he's having a steroid flare which can happen - counterintuitively you can get more inflammation just after the shot but it should calm down within a few days and the medicine should help him.  He should ice 15 minutes at a time up to every hour, would encourage him to take tylenol and aleve as well.  If he gets increased redness, fever > 100.4 degrees I would definitely want to see him - same if it doesn't slowly improve over the next 2-3 days.

## 2017-12-28 ENCOUNTER — Other Ambulatory Visit: Payer: Self-pay | Admitting: Nurse Practitioner

## 2017-12-28 DIAGNOSIS — R0789 Other chest pain: Secondary | ICD-10-CM

## 2017-12-30 ENCOUNTER — Other Ambulatory Visit: Payer: Self-pay | Admitting: Physician Assistant

## 2017-12-30 DIAGNOSIS — E1141 Type 2 diabetes mellitus with diabetic mononeuropathy: Secondary | ICD-10-CM

## 2018-01-03 ENCOUNTER — Telehealth: Payer: Self-pay | Admitting: Family Medicine

## 2018-01-03 NOTE — Telephone Encounter (Signed)
Patient states he is still in pain. He would like to go ahead with an x-ray of his knee

## 2018-01-03 NOTE — Telephone Encounter (Signed)
Ok the order is already in there.  He can come get it at his convenience.  Thanks!

## 2018-01-03 NOTE — Telephone Encounter (Signed)
Patient informed. 

## 2018-01-09 ENCOUNTER — Other Ambulatory Visit: Payer: Self-pay | Admitting: Nurse Practitioner

## 2018-01-09 ENCOUNTER — Telehealth: Payer: Self-pay | Admitting: *Deleted

## 2018-01-09 ENCOUNTER — Other Ambulatory Visit: Payer: Self-pay | Admitting: *Deleted

## 2018-01-09 DIAGNOSIS — I5032 Chronic diastolic (congestive) heart failure: Secondary | ICD-10-CM

## 2018-01-09 DIAGNOSIS — R079 Chest pain, unspecified: Secondary | ICD-10-CM

## 2018-01-09 DIAGNOSIS — I10 Essential (primary) hypertension: Secondary | ICD-10-CM

## 2018-01-09 MED ORDER — METOPROLOL TARTRATE 50 MG PO TABS: 50.0000 mg | ORAL_TABLET | ORAL | 0 refills | Status: DC

## 2018-01-09 NOTE — Telephone Encounter (Signed)
S/w pt went over instructions for upcoming CTA.  Will leave a copy in front office with a script for Lopressor (50 mg ) to take the night before test at bedtime and bring extra tablet to hospital the next day just in case it is needed. Pt will pick up these instructions when pt gets bmet for upcoming procedure. Appt made, orders in and linked.

## 2018-01-11 ENCOUNTER — Ambulatory Visit (HOSPITAL_BASED_OUTPATIENT_CLINIC_OR_DEPARTMENT_OTHER)
Admission: RE | Admit: 2018-01-11 | Discharge: 2018-01-11 | Disposition: A | Discharge status: Home / Self Care | Payer: 59 | Source: Ambulatory Visit | Attending: Family Medicine | Admitting: Family Medicine

## 2018-01-11 DIAGNOSIS — M25562 Pain in left knee: Secondary | ICD-10-CM | POA: Diagnosis present

## 2018-01-17 ENCOUNTER — Other Ambulatory Visit: Payer: 59

## 2018-01-17 DIAGNOSIS — I5032 Chronic diastolic (congestive) heart failure: Secondary | ICD-10-CM

## 2018-01-17 DIAGNOSIS — I10 Essential (primary) hypertension: Secondary | ICD-10-CM

## 2018-01-17 DIAGNOSIS — R079 Chest pain, unspecified: Secondary | ICD-10-CM

## 2018-01-17 LAB — BASIC METABOLIC PANEL
BUN/Creatinine Ratio: 16 (ref 9–20)
BUN: 17 mg/dL (ref 6–24)
CO2: 22 mmol/L (ref 20–29)
Calcium: 9.7 mg/dL (ref 8.7–10.2)
Chloride: 104 mmol/L (ref 96–106)
Creatinine, Ser: 1.07 mg/dL (ref 0.76–1.27)
GFR calc Af Amer: 94 mL/min/{1.73_m2} (ref >59)
GFR calc non Af Amer: 82 mL/min/{1.73_m2} (ref >59)
Glucose: 129 mg/dL — ABNORMAL HIGH (ref 65–99)
Potassium: 4.6 mmol/L (ref 3.5–5.2)
Sodium: 141 mmol/L (ref 134–144)

## 2018-01-24 ENCOUNTER — Other Ambulatory Visit: Payer: Self-pay | Admitting: Nurse Practitioner

## 2018-01-24 DIAGNOSIS — R0789 Other chest pain: Secondary | ICD-10-CM

## 2018-01-25 ENCOUNTER — Ambulatory Visit (HOSPITAL_COMMUNITY): Payer: 59

## 2018-02-16 ENCOUNTER — Ambulatory Visit (HOSPITAL_COMMUNITY)
Admission: RE | Admit: 2018-02-16 | Discharge: 2018-02-16 | Disposition: A | Discharge status: Home / Self Care | Payer: 59 | Source: Ambulatory Visit | Attending: Nurse Practitioner | Admitting: Nurse Practitioner

## 2018-02-16 ENCOUNTER — Ambulatory Visit (HOSPITAL_COMMUNITY): Payer: 59

## 2018-02-16 ENCOUNTER — Encounter: Payer: 59 | Admitting: *Deleted

## 2018-02-16 DIAGNOSIS — R079 Chest pain, unspecified: Secondary | ICD-10-CM | POA: Diagnosis not present

## 2018-02-16 DIAGNOSIS — R0789 Other chest pain: Secondary | ICD-10-CM | POA: Insufficient documentation

## 2018-02-16 DIAGNOSIS — Z006 Encounter for examination for normal comparison and control in clinical research program: Secondary | ICD-10-CM

## 2018-02-16 MED ORDER — NITROGLYCERIN 0.4 MG SL SUBL
SUBLINGUAL_TABLET | SUBLINGUAL | Status: AC
  Filled 2018-02-16: qty 2

## 2018-02-16 MED ORDER — METOPROLOL TARTRATE 5 MG/5ML IV SOLN
INTRAVENOUS | Status: AC
  Filled 2018-02-16: qty 10

## 2018-02-16 MED ORDER — IOHEXOL 300 MG/ML  SOLN
100.0000 mL | Freq: Once | INTRAMUSCULAR | Status: AC | PRN
  Administered 2018-02-16: 100 mL via INTRAVENOUS

## 2018-02-16 MED ORDER — NITROGLYCERIN 0.4 MG SL SUBL
0.8000 mg | SUBLINGUAL_TABLET | Freq: Once | SUBLINGUAL | Status: AC
  Administered 2018-02-16: 0.8 mg via SUBLINGUAL
  Filled 2018-02-16: qty 25

## 2018-02-16 MED ORDER — METOPROLOL TARTRATE 5 MG/5ML IV SOLN
5.0000 mg | INTRAVENOUS | Status: DC | PRN
  Administered 2018-02-16: 5 mg via INTRAVENOUS
  Filled 2018-02-16: qty 5

## 2018-02-16 NOTE — Research (Signed)
Scott [name] met criteria for the CADFEM trial. The study was explained to him including the risks/benefits. He had the opportunity to read the consent and ask questions. The consent was signed and a copy was given to the patient. No study related procedures were performed prior to the consent being signed at 9:40.

## 2018-04-18 ENCOUNTER — Encounter: Payer: Self-pay | Admitting: Family Medicine

## 2018-04-18 ENCOUNTER — Ambulatory Visit (INDEPENDENT_AMBULATORY_CARE_PROVIDER_SITE_OTHER): Payer: 59 | Admitting: Family Medicine

## 2018-04-18 VITALS — BP 150/97 | HR 87 | Ht 66.0 in | Wt 230.0 lb

## 2018-04-18 DIAGNOSIS — M545 Low back pain: Secondary | ICD-10-CM | POA: Diagnosis not present

## 2018-04-18 DIAGNOSIS — G8929 Other chronic pain: Secondary | ICD-10-CM | POA: Diagnosis not present

## 2018-04-18 NOTE — Patient Instructions (Signed)
We will go ahead with a facet injection on the right at L4-5. Let me know a week after this how you're doing. These are the different medications you can take for this: Tylenol 500mg  1-2 tabs three times a day for pain. Capsaicin, aspercreme, or biofreeze topically up to four times a day may also help with pain. Some supplements that may help for arthritis: Boswellia extract, curcumin, pycnogenol Consider revisiting physical therapy but avoiding extension based maneuvers.

## 2018-04-18 NOTE — Progress Notes (Signed)
PCP: Lizbeth Bark, FNP  Subjective:   HPI: Patient is a 47 y.o. male here for low back pain.  01/27/17: Patient reports he's had about 2.5 months of right sided low back pain. No acute injury or trauma. Pain does not radiate. No numbness or tingling. Pain level up to 7/10 and sharp. Worse with standing, sitting, and bending. Tried naproxen and biofreeze.  04/18/18: Patient continues to struggle with right sided low back pain reporting this is up to 10/10 level at times, sharp. Worse with turning to the right side, bending back. Has tried topical cream, physical therapy and home exercises without much relief. Radiates only rarely into right leg. No bowel/bladder dysfunction. No skin changes, numbness.  Past Medical History:  Diagnosis Date  . Carpal tunnel syndrome   . Diabetes mellitus   . DVT (deep venous thrombosis) (HCC)   . Hypertension   . Obesity   . Shoulder pain, bilateral   . Sleep apnea    was fitted for mask and never got machine - too costly    Current Outpatient Medications on File Prior to Visit  Medication Sig Dispense Refill  . amLODipine (NORVASC) 10 MG tablet Take 1 tablet (10 mg total) by mouth daily. (Patient taking differently: Take 1 tablet (10 mg total) by mouth daily.) 90 tablet 3  . aspirin EC 81 MG tablet Take 1 tablet (81 mg total) by mouth daily. 90 tablet 1  . atorvastatin (LIPITOR) 20 MG tablet Take 1 tablet (20 mg total) by mouth daily. (Patient taking differently: Take 1 tablet (20 mg total) by mouth daily.) 90 tablet 3  . fluticasone (FLONASE) 50 MCG/ACT nasal spray SPRAY TWO SPRAYS INTO BOTH NOSTRILS  DAILY 16 g 5  . glucose blood (ONE TOUCH ULTRA TEST) test strip Use as instructed 100 each 1  . ibuprofen (ADVIL,MOTRIN) 600 MG tablet Take 1 tablet (600 mg total) by mouth every 8 (eight) hours as needed (Take with food.). 30 tablet 1  . INVOKANA 300 MG TABS tablet Take 1 tablet (300 mg total) by mouth daily. MUST MAKE APPT FOR  FURTHER REFILLS 30 tablet 0  . Linagliptin-metFORMIN HCl (JENTADUETO) 2.08-998 MG TABS Take 1 tablet by mouth 2 (two) times daily. 180 tablet 3  . lisinopril (PRINIVIL,ZESTRIL) 20 MG tablet Take 1 tablet (20 mg total) by mouth daily. (Patient taking differently: Take 1 tablet (20 mg total) by mouth daily.) 90 tablet 3  . metoprolol tartrate (LOPRESSOR) 50 MG tablet Take 1 tablet (50 mg total) by mouth as directed for 1 dose. Take 1 tablet at bedtime the night prior to the test. Bring the 2nd pill with you on the day of the test. 2 tablet 0  . omeprazole (PRILOSEC) 20 MG capsule Take 1 capsule (20 mg total) by mouth daily as needed (heartburn). 30 capsule 3   No current facility-administered medications on file prior to visit.     Past Surgical History:  Procedure Laterality Date  . CARPAL TUNNEL RELEASE Right 12/04/2015   Procedure: RIGHT CARPAL TUNNEL RELEASE;  Surgeon: Betha Loa, MD;  Location: [facility];  Service: Orthopedics;  Laterality: Right;  RIGHT CARPAL TUNNEL RELEASE  . CARPAL TUNNEL RELEASE Right 12/2015   [facility]  . CARPAL TUNNEL RELEASE Left 04/01/2016   Procedure: LEFT CARPAL TUNNEL RELEASE;  Surgeon: Betha Loa, MD;  Location: [facility];  Service: Orthopedics;  Laterality: Left;  . right hand surgery    . ROTATOR CUFF REPAIR  No Known Allergies  Social History   Socioeconomic History  . Marital status: Married    Spouse name: Not on file  . Number of children: Not on file  . Years of education: 12+  . Highest education level: Not on file  Occupational History  . Occupation: real Insurance account managerestate agent  Social Needs  . Financial resource strain: Not on file  . Food insecurity:    Worry: Not on file    Inability: Not on file  . Transportation needs:    Medical: Not on file    Non-medical: Not on file  Tobacco Use  . Smoking status: Never Smoker  . Smokeless tobacco: Never Used  Substance and Sexual Activity  . Alcohol use: No     Alcohol/week: 0.0 standard drinks  . Drug use: No  . Sexual activity: Not on file  Lifestyle  . Physical activity:    Days per week: Not on file    Minutes per session: Not on file  . Stress: Not on file  Relationships  . Social connections:    Talks on phone: Not on file    Gets together: Not on file    Attends religious service: Not on file    Active member of club or organization: Not on file    Attends meetings of clubs or organizations: Not on file    Relationship status: Not on file  . Intimate partner violence:    Fear of current or ex partner: Not on file    Emotionally abused: Not on file    Physically abused: Not on file    Forced sexual activity: Not on file  Other Topics Concern  . Not on file  Social History Narrative   Lives at home.   Right-handed.   Occasional caffeine use.    Family History  Problem Relation Age of Onset  . Hypertension Mother   . Kidney disease Father   . Hypertension Other   . Hyperlipidemia Other   . Diabetes Other     BP (!) 150/97   Pulse 87   Ht 5\' 6"  (1.676 m)   Wt 230 lb (104.3 kg)   BMI 37.12 kg/m   Review of Systems: See HPI above.     Objective:  Physical Exam:  Gen: NAD, comfortable in exam room  Back: No gross deformity, scoliosis. TTP mildly right lumbar paraspinal muscles.  No midline or bony TTP. FROM with pain on extension and right lateral rotation. Strength LEs 5/5 all muscle groups.   2+ MSRs in patellar and achilles tendons, equal bilaterally. Negative SLRs. Sensation intact to light touch bilaterally.  Bilateral hips: No deformity. FROM with 5/5 strength. No tenderness to palpation. NVI distally. Negative logroll bilateral hips   Assessment & Plan:  1. Low back pain - persistent and ongoing for over a year now.  MRI was reassuring though he did have some hypertrophy of right L4-5 facet joint - pain with extension and right lateral rotation suggests this could be a source of his pain - he  would like to try injection here.  Also encouraged to consider physical therapy again with flexion based program.  Discussed tylenol, topical medications, supplements.  Let us know how he's doing a week after injection.

## 2018-04-19 ENCOUNTER — Other Ambulatory Visit: Payer: Self-pay | Admitting: Family Medicine

## 2018-04-19 DIAGNOSIS — M545 Low back pain: Principal | ICD-10-CM

## 2018-04-19 DIAGNOSIS — G8929 Other chronic pain: Secondary | ICD-10-CM

## 2018-05-02 ENCOUNTER — Other Ambulatory Visit: Payer: Self-pay | Admitting: Family Medicine

## 2018-05-02 ENCOUNTER — Ambulatory Visit
Admission: RE | Admit: 2018-05-02 | Discharge: 2018-05-02 | Disposition: A | Discharge status: Home / Self Care | Payer: 59 | Source: Ambulatory Visit | Attending: Family Medicine | Admitting: Family Medicine

## 2018-05-02 DIAGNOSIS — E1141 Type 2 diabetes mellitus with diabetic mononeuropathy: Secondary | ICD-10-CM

## 2018-05-02 DIAGNOSIS — M545 Low back pain, unspecified: Secondary | ICD-10-CM

## 2018-05-02 DIAGNOSIS — G8929 Other chronic pain: Secondary | ICD-10-CM

## 2018-05-02 MED ORDER — METHYLPREDNISOLONE ACETATE 40 MG/ML INJ SUSP (RADIOLOG
120.0000 mg | Freq: Once | INTRAMUSCULAR | Status: AC
  Administered 2018-05-02: 120 mg via INTRA_ARTICULAR

## 2018-05-02 MED ORDER — IOPAMIDOL (ISOVUE-M 200) INJECTION 41%
1.0000 mL | Freq: Once | INTRAMUSCULAR | Status: AC
  Administered 2018-05-02: 1 mL via INTRA_ARTICULAR

## 2018-05-02 NOTE — Discharge Instructions (Signed)

## 2018-05-15 NOTE — Progress Notes (Incomplete)
PCP: Lizbeth BarkHairston, Mandesia R, FNP  Subjective:   HPI: Patient is a 48 y.o. male here for left knee pain.  Patient reports about 2 1/2 weeks he started to have left knee pain. Didn't injure himself but felt like a tweak medial side. Pain level 6/10 and sharp medially. Has tried icing, tylenol, bracing. Limping. Some swelling. No skin changes, numbness.  Past Medical History:  Diagnosis Date  . Carpal tunnel syndrome   . Diabetes mellitus   . DVT (deep venous thrombosis) (HCC)   . Hypertension   . Obesity   . Shoulder pain, bilateral   . Sleep apnea    was fitted for mask and never got machine - too costly    Current Outpatient Medications on File Prior to Visit  Medication Sig Dispense Refill  . amLODipine (NORVASC) 10 MG tablet Take 1 tablet (10 mg total) by mouth daily. (Patient taking differently: Take 1 tablet (10 mg total) by mouth daily.) 90 tablet 3  . aspirin EC 81 MG tablet Take 1 tablet (81 mg total) by mouth daily. 90 tablet 1  . atorvastatin (LIPITOR) 20 MG tablet Take 1 tablet (20 mg total) by mouth daily. (Patient taking differently: Take 1 tablet (20 mg total) by mouth daily.) 90 tablet 3  . fluticasone (FLONASE) 50 MCG/ACT nasal spray SPRAY TWO SPRAYS INTO BOTH NOSTRILS  DAILY 16 g 5  . glucose blood (ONE TOUCH ULTRA TEST) test strip Use as instructed 100 each 1  . ibuprofen (ADVIL,MOTRIN) 600 MG tablet Take 1 tablet (600 mg total) by mouth every 8 (eight) hours as needed (Take with food.). 30 tablet 1  . INVOKANA 300 MG TABS tablet Take 1 tablet (300 mg total) by mouth daily. (Patient not taking: Reported on 08/30/2017) 30 tablet 3  . Linagliptin-metFORMIN HCl (JENTADUETO) 2.08-998 MG TABS Take 1 tablet by mouth 2 (two) times daily. 180 tablet 3  . lisinopril (PRINIVIL,ZESTRIL) 20 MG tablet Take 1 tablet (20 mg total) by mouth daily. (Patient taking differently: Take 1 tablet (20 mg total) by mouth daily.) 90 tablet 3  . metoprolol tartrate (LOPRESSOR) 50 MG tablet  Take 1 tablet (50 mg total) by mouth as directed. Take one pill the night prior to your scan - bring the second pill with you on the day of your scan. 2 tablet 0  . omeprazole (PRILOSEC) 20 MG capsule Take 1 capsule (20 mg total) by mouth daily as needed (heartburn). 30 capsule 3   No current facility-administered medications on file prior to visit.     Past Surgical History:  Procedure Laterality Date  . CARPAL TUNNEL RELEASE Right 12/04/2015   Procedure: RIGHT CARPAL TUNNEL RELEASE;  Surgeon: Betha LoaKevin Kuzma, MD;  Location: [facility];  Service: Orthopedics;  Laterality: Right;  RIGHT CARPAL TUNNEL RELEASE  . CARPAL TUNNEL RELEASE Right 12/2015   [facility]  . CARPAL TUNNEL RELEASE Left 04/01/2016   Procedure: LEFT CARPAL TUNNEL RELEASE;  Surgeon: Betha LoaKevin Kuzma, MD;  Location: [facility];  Service: Orthopedics;  Laterality: Left;  . right hand surgery    . ROTATOR CUFF REPAIR      No Known Allergies  Social History   Socioeconomic History  . Marital status: Married    Spouse name: Not on file  . Number of children: Not on file  . Years of education: 12+  . Highest education level: Not on file  Occupational History  . Occupation: real Insurance account managerestate agent  Social Needs  . Financial resource strain:  Not on file  . Food insecurity:    Worry: Not on file    Inability: Not on file  . Transportation needs:    Medical: Not on file    Non-medical: Not on file  Tobacco Use  . Smoking status: Never Smoker  . Smokeless tobacco: Never Used  Substance and Sexual Activity  . Alcohol use: No    Alcohol/week: 0.0 oz  . Drug use: No  . Sexual activity: Not on file  Lifestyle  . Physical activity:    Days per week: Not on file    Minutes per session: Not on file  . Stress: Not on file  Relationships  . Social connections:    Talks on phone: Not on file    Gets together: Not on file    Attends religious service: Not on file    Active member of club or organization: Not  on file    Attends meetings of clubs or organizations: Not on file    Relationship status: Not on file  . Intimate partner violence:    Fear of current or ex partner: Not on file    Emotionally abused: Not on file    Physically abused: Not on file    Forced sexual activity: Not on file  Other Topics Concern  . Not on file  Social History Narrative   Lives at home.   Right-handed.   Occasional caffeine use.    Family History  Problem Relation Age of Onset  . Hypertension Mother   . Kidney disease Father   . Hypertension Other   . Hyperlipidemia Other   . Diabetes Other     BP 136/89   Pulse 98   Ht 5\' 6"  (1.676 m)   Wt 235 lb (106.6 kg)   BMI 37.93 kg/m   Review of Systems: See HPI above.     Objective:  Physical Exam:  Gen: NAD, comfortable in exam room  Left knee: Minimal effusion.  No other gross deformity, ecchymoses. TTP medial joint line. FROM with 5/5 strength flexion and extension. Negative ant/post drawers. Negative valgus/varus testing. Negative lachmanns. Negative mcmurrays, apleys, patellar apprehension. NV intact distally.  Right knee: No deformity. FROM with 5/5 strength. No tenderness to palpation. NVI distally.    Assessment & Plan:  1. Left knee pain - patient's exam is reassuring.  Consistent with flare of medial arthritis.  Cortisone injection given today.  Tylenol, topical medications, supplements reviewed as well as aleve.  Shown home exercises to do daily.  Heat/ice.  Consider physical therapy, imaging if not improving as expected.  F/u in 1 month.  After informed written consent timeout was performed, patient was seated on exam table. Left knee was prepped with alcohol swab and utilizing anteromedial approach, patient's left knee was injected intraarticularly with 3:1 bupivicaine: depomedrol. Patient tolerated the procedure well without immediate complications.

## 2018-06-01 ENCOUNTER — Other Ambulatory Visit: Payer: Self-pay | Admitting: Family Medicine

## 2018-06-01 DIAGNOSIS — E1141 Type 2 diabetes mellitus with diabetic mononeuropathy: Secondary | ICD-10-CM

## 2018-06-12 ENCOUNTER — Encounter: Payer: Self-pay | Admitting: Family Medicine

## 2018-06-12 ENCOUNTER — Ambulatory Visit (INDEPENDENT_AMBULATORY_CARE_PROVIDER_SITE_OTHER): Payer: 59 | Admitting: Family Medicine

## 2018-06-12 VITALS — BP 143/85 | HR 95 | Ht 66.0 in | Wt 235.0 lb

## 2018-06-12 DIAGNOSIS — M7672 Peroneal tendinitis, left leg: Secondary | ICD-10-CM | POA: Diagnosis not present

## 2018-06-12 DIAGNOSIS — M25572 Pain in left ankle and joints of left foot: Secondary | ICD-10-CM | POA: Diagnosis not present

## 2018-06-12 NOTE — Progress Notes (Signed)
PCP: Lizbeth Bark, FNP  Subjective:   HPI: Patient is a 48 y.o. male here for left ankle pain.  Pain began approximately 5 days ago on 06/07/2018.  Patient notes he was wearing boots with untied laces in do some light work outside of his house.  He denies any specific injury, but believes this may have contributed.  He localizes pain primarily to the lateral ankle posterior to the lateral malleolus.  He also notes pain posteriorly over the Achilles.  Pain can be 10/10 and sharp with walking.  No notable swelling or erythema.  No ecchymosis.  No numbness or tingling.  He has not tried any pain medications or ice.  He did apply heat.  Past Medical History:  Diagnosis Date  . Carpal tunnel syndrome   . Diabetes mellitus   . DVT (deep venous thrombosis) (HCC)   . Hypertension   . Obesity   . Shoulder pain, bilateral   . Sleep apnea    was fitted for mask and never got machine - too costly    Current Outpatient Medications on File Prior to Visit  Medication Sig Dispense Refill  . amLODipine (NORVASC) 10 MG tablet Take 1 tablet (10 mg total) by mouth daily. (Patient taking differently: Take 1 tablet (10 mg total) by mouth daily.) 90 tablet 3  . aspirin EC 81 MG tablet Take 1 tablet (81 mg total) by mouth daily. 90 tablet 1  . atorvastatin (LIPITOR) 20 MG tablet Take 1 tablet (20 mg total) by mouth daily. (Patient taking differently: Take 1 tablet (20 mg total) by mouth daily.) 90 tablet 3  . fluticasone (FLONASE) 50 MCG/ACT nasal spray SPRAY TWO SPRAYS INTO BOTH NOSTRILS  DAILY 16 g 5  . glucose blood (ONE TOUCH ULTRA TEST) test strip Use as instructed 100 each 1  . ibuprofen (ADVIL,MOTRIN) 600 MG tablet Take 1 tablet (600 mg total) by mouth every 8 (eight) hours as needed (Take with food.). 30 tablet 1  . INVOKANA 300 MG TABS tablet Take 1 tablet (300 mg total) by mouth daily. MUST MAKE APPT FOR FURTHER REFILLS 30 tablet 0  . Linagliptin-metFORMIN HCl (JENTADUETO) 2.08-998 MG TABS Take 1  tablet by mouth 2 (two) times daily. 180 tablet 3  . lisinopril (PRINIVIL,ZESTRIL) 20 MG tablet Take 1 tablet (20 mg total) by mouth daily. (Patient taking differently: Take 1 tablet (20 mg total) by mouth daily.) 90 tablet 3  . metoprolol tartrate (LOPRESSOR) 50 MG tablet Take 1 tablet (50 mg total) by mouth as directed for 1 dose. Take 1 tablet at bedtime the night prior to the test. Bring the 2nd pill with you on the day of the test. 2 tablet 0  . omeprazole (PRILOSEC) 20 MG capsule Take 1 capsule (20 mg total) by mouth daily as needed (heartburn). 30 capsule 3   No current facility-administered medications on file prior to visit.     Past Surgical History:  Procedure Laterality Date  . CARPAL TUNNEL RELEASE Right 12/04/2015   Procedure: RIGHT CARPAL TUNNEL RELEASE;  Surgeon: Betha Loa, MD;  Location: [facility];  Service: Orthopedics;  Laterality: Right;  RIGHT CARPAL TUNNEL RELEASE  . CARPAL TUNNEL RELEASE Right 12/2015   [facility]  . CARPAL TUNNEL RELEASE Left 04/01/2016   Procedure: LEFT CARPAL TUNNEL RELEASE;  Surgeon: Betha Loa, MD;  Location:  SURGERY CENTER;  Service: Orthopedics;  Laterality: Left;  . right hand surgery    . ROTATOR CUFF REPAIR  No Known Allergies  Social History   Socioeconomic History  . Marital status: Married    Spouse name: Not on file  . Number of children: Not on file  . Years of education: 12+  . Highest education level: Not on file  Occupational History  . Occupation: real Insurance account manager  Social Needs  . Financial resource strain: Not on file  . Food insecurity:    Worry: Not on file    Inability: Not on file  . Transportation needs:    Medical: Not on file    Non-medical: Not on file  Tobacco Use  . Smoking status: Never Smoker  . Smokeless tobacco: Never Used  Substance and Sexual Activity  . Alcohol use: No    Alcohol/week: 0.0 standard drinks  . Drug use: No  . Sexual activity: Not on file  Lifestyle  .  Physical activity:    Days per week: Not on file    Minutes per session: Not on file  . Stress: Not on file  Relationships  . Social connections:    Talks on phone: Not on file    Gets together: Not on file    Attends religious service: Not on file    Active member of club or organization: Not on file    Attends meetings of clubs or organizations: Not on file    Relationship status: Not on file  . Intimate partner violence:    Fear of current or ex partner: Not on file    Emotionally abused: Not on file    Physically abused: Not on file    Forced sexual activity: Not on file  Other Topics Concern  . Not on file  Social History Narrative   Lives at home.   Right-handed.   Occasional caffeine use.    Family History  Problem Relation Age of Onset  . Hypertension Mother   . Kidney disease Father   . Hypertension Other   . Hyperlipidemia Other   . Diabetes Other     BP (!) 143/85   Pulse 95   Ht 5\' 6"  (1.676 m)   Wt 235 lb (106.6 kg)   BMI 37.93 kg/m   Review of Systems: See HPI above.     Objective:  Physical Exam:  Gen: awake, alert, NAD, comfortable in exam room Pulm: breathing unlabored  Left ankle: - Inspection: No obvious deformity, erythema, swelling, or ecchymoses - Palpation: Tenderness over the peroneal tendons.  Some slight tenderness over the Achilles midsubstance.  No tenderness at the musculotendinous junction or insertional calcaneus. - Strength: Normal strength with dorsiflexion, plantarflexion, inversion, and eversion - ROM: Full ROM - Neuro/vasc: NV intact  Right ankle: 5/5 strength, full ROM  MSK [place]: Left ankle: Achilles has normal appearance.  No thickening.  No hypoechoic change.  There is moderate thickening of the peroneal tendons as they traverse posterior to the lateral malleolus.  There is also trace tenosynovitis at this area.  Otherwise, the tendons are intact without tears.   Assessment & Plan:  1.  Left ankle pain-secondary to  peroneal tendinitis/tenosynovitis and to lesser extent Achilles tendinitis. - Ice 15 minutes 3-4 times daily - Naproxen for 1 week then as needed - Patient provided with a walking boot for comfort - Follow-up in 2 weeks

## 2018-06-12 NOTE — Patient Instructions (Signed)
You have peroneal tendinitis and tenosynovitis. Ice the area 15 minutes at a time 3-4 times a day at least. Aleve 2 tabs twice a day with food regularly for pain and inflammation - I'd take at least a week then as needed if you're better. Boot when up and walking around. When done with the boot transition to shoes but with arch support. Follow up with me in 2 weeks for reevaluation.

## 2018-06-20 ENCOUNTER — Other Ambulatory Visit: Payer: Self-pay

## 2018-06-20 ENCOUNTER — Ambulatory Visit (HOSPITAL_COMMUNITY)
Admission: EM | Admit: 2018-06-20 | Discharge: 2018-06-20 | Disposition: A | Discharge status: Home / Self Care | Payer: 59 | Attending: Family Medicine | Admitting: Family Medicine

## 2018-06-20 ENCOUNTER — Encounter (HOSPITAL_COMMUNITY): Payer: Self-pay | Admitting: Emergency Medicine

## 2018-06-20 DIAGNOSIS — H6983 Other specified disorders of Eustachian tube, bilateral: Secondary | ICD-10-CM | POA: Diagnosis not present

## 2018-06-20 NOTE — Discharge Instructions (Signed)
Start flonase or nasacort spray 2 spray each nostril once daily for the next 7- 14 days to help with symptoms. Follow up with ENT for further evaluation if symptoms noti mproving.

## 2018-06-20 NOTE — ED Provider Notes (Signed)
[facility]    CSN: [id number] Arrival date & time: 06/20/18  1722     History   Chief Complaint Chief Complaint  Patient presents with  . Otalgia    HPI Scott [name] is a 48 y.o. male.   48 year old male comes in for bilateral ear pain/muffled hearing for the past month. States symptoms are intermittent. With improvement with chewing, and worsening when laying on the ear. He has intermittent sharp pains that lasts for seconds. States pains has been more frequent and came in for evaluation.  He does have seasonal allergies, and takes allergy medicine for.  Denies recent URI symptoms such as cough, congestion, sore throat.  He takes Flonase intermittently.     Past Medical History:  Diagnosis Date  . Carpal tunnel syndrome   . Diabetes mellitus   . DVT (deep venous thrombosis) (HCC)   . Hypertension   . Obesity   . Shoulder pain, bilateral   . Sleep apnea    was fitted for mask and never got machine - too costly    Patient Active Problem List   Diagnosis Date Noted  . Left knee pain 11/27/2017  . Low back pain 01/30/2017  . Exertional chest pain 11/07/2016  . Chronic diastolic heart failure (HCC) 11/07/2016  . Plantar fasciitis of left foot 05/27/2016  . Medial epicondylitis of right elbow 11/18/2015  . Paresthesia of both hands 05/13/2015  . Bilateral carpal tunnel syndrome 12/18/2014  . OSA (obstructive sleep apnea) 04/18/2012  . 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 ? DVT of popliteal vein, right (non occlusive) 09/03/2011  . Pulmonary embolism (HCC) 09/02/2011  . DM type 2 (diabetes mellitus, type 2) (HCC) 09/02/2011  . HTN (hypertension), benign 09/02/2011    Past Surgical History:  Procedure Laterality Date  . CARPAL TUNNEL RELEASE Right 12/04/2015   Procedure: RIGHT CARPAL TUNNEL RELEASE;  Surgeon: Betha LoaKevin Kuzma, MD;  Location: [facility];  Service:  Orthopedics;  Laterality: Right;  RIGHT CARPAL TUNNEL RELEASE  . CARPAL TUNNEL RELEASE Right 12/2015   [facility]  . CARPAL TUNNEL RELEASE Left 04/01/2016   Procedure: LEFT CARPAL TUNNEL RELEASE;  Surgeon: Betha LoaKevin Kuzma, MD;  Location: [facility];  Service: Orthopedics;  Laterality: Left;  . right hand surgery    . ROTATOR CUFF REPAIR         Home Medications    Prior to Admission medications   Medication Sig Start Date End Date Taking? Authorizing Provider  amLODipine (NORVASC) 10 MG tablet Take 1 tablet (10 mg total) by mouth daily. Patient taking differently: Take 1 tablet (10 mg total) by mouth daily. 06/29/17  Yes Anders SimmondsMcClung, Angela M, PA-C  aspirin EC 81 MG tablet Take 1 tablet (81 mg total) by mouth daily. 11/21/17  Yes Hoy RegisterNewlin, Enobong, MD  atorvastatin (LIPITOR) 20 MG tablet Take 1 tablet (20 mg total) by mouth daily. Patient taking differently: Take 1 tablet (20 mg total) by mouth daily. 06/29/17  Yes McClung, Angela M, PA-C  INVOKANA 300 MG TABS tablet Take 1 tablet (300 mg total) by mouth daily. MUST MAKE APPT FOR FURTHER REFILLS 12/30/17  Yes Newlin, Enobong, MD  Linagliptin-metFORMIN HCl (JENTADUETO) 2.08-998 MG TABS Take 1 tablet by mouth 2 (two) times daily. 06/29/17  Yes Georgian CoMcClung, Angela M, PA-C  lisinopril (PRINIVIL,ZESTRIL) 20 MG tablet Take 1 tablet (20 mg total) by mouth daily. Patient taking differently: Take 1 tablet (20 mg total) by  mouth daily. 06/29/17  Yes McClung, Angela M, PA-C  fluticasone [facility]) 50 MCG/ACT nasal spray SPRAY TWO SPRAYS INTO BOTH NOSTRILS  DAILY 06/29/17   Georgian Co M, PA-C  glucose blood (ONE TOUCH ULTRA TEST) test strip Use as instructed 04/29/17   Blue, Olivia C, PA-C  ibuprofen (ADVIL,MOTRIN) 600 MG tablet Take 1 tablet (600 mg total) by mouth every 8 (eight) hours as needed (Take with food.). 11/05/16   Lizbeth Bark, FNP  omeprazole (PRILOSEC) 20 MG capsule Take 1 capsule (20 mg total) by mouth daily as needed (heartburn).  06/29/17   Anders Simmonds, PA-C    Family History Family History  Problem Relation Age of Onset  . Hypertension Mother   . Kidney disease Father   . Hypertension Other   . Hyperlipidemia Other   . Diabetes Other     Social History Social History   Tobacco Use  . Smoking status: Never Smoker  . Smokeless tobacco: Never Used  Substance Use Topics  . Alcohol use: No    Alcohol/week: 0.0 standard drinks  . Drug use: No     Allergies   Patient has no known allergies.   Review of Systems Review of Systems  Reason unable to perform ROS: See HPI as above.     Physical Exam Triage Vital Signs ED Triage Vitals  Enc Vitals Group     BP 06/20/18 1748 133/90     Pulse Rate 06/20/18 1748 98     Resp 06/20/18 1748 18     Temp 06/20/18 1748 98 F (36.7 C)     Temp Source 06/20/18 1748 Temporal     SpO2 06/20/18 1748 97 %     Weight --      Height --      Head Circumference --      Peak Flow --      Pain Score 06/20/18 1745 0     Pain Loc --      Pain Edu? --      Excl. in GC? --    No data found.  Updated Vital Signs BP 133/90 (BP Location: Right Arm) Comment (BP Location): large cuff  Pulse 98   Temp 98 F (36.7 C) (Temporal)   Resp 18   SpO2 97%   Physical Exam Constitutional:      General: He is not in acute distress.    Appearance: He is well-developed. He is not ill-appearing, toxic-appearing or diaphoretic.  HENT:     Head: Normocephalic and atraumatic.     Left Ear: Tympanic membrane, ear canal and external ear normal.  No middle ear effusion. Tympanic membrane is not erythematous or bulging.     Ears:     Comments: No tenderness to palpation of bilateral tragus.  Mild cerumen impaction in the right ear canal, TM partially visible.  Earwax removed with curette.  TM fully visible, no erythema, bulging, middle ear effusion. Eyes:     Conjunctiva/sclera: Conjunctivae normal.     Pupils: Pupils are equal, round, and reactive to light.    Neurological:     Mental Status: He is alert and oriented to person, place, and time.      UC Treatments / Results  Labs (all labs ordered are listed, but only abnormal results are displayed) Labs Reviewed - No data to display  EKG None  Radiology No results found.  Procedures Procedures (including critical care time)  Medications Ordered in UC Medications - No data to display  Initial Impression / Assessment and Plan / UC Course  I have reviewed the triage vital signs and the nursing notes.  Pertinent labs & imaging results that were available during my care of the patient were reviewed by me and considered in my medical decision making (see chart for details).    Discussed possible eustachian tube dysfunction.  Patient to use Flonase or Nasacort daily for the next 7 to 14 days.  Continue allergy medicine.  Return precautions given.  ENT information provided as needed.  Patient expresses understanding and agrees to plan.  Final Clinical Impressions(s) / UC Diagnoses   Final diagnoses:  Dysfunction of both eustachian tubes    ED Prescriptions    None        Belinda Fisher, PA-C 06/20/18 2542

## 2018-06-20 NOTE — ED Triage Notes (Signed)
Bilateral ear pain, intermittent.  Has noticed these episodes for a month.  Ears intermittently get stuffy, depending which side he is lying on.  Also notices with chewing, random stuffiness of ears.

## 2018-06-26 ENCOUNTER — Ambulatory Visit (INDEPENDENT_AMBULATORY_CARE_PROVIDER_SITE_OTHER): Payer: 59 | Admitting: Family Medicine

## 2018-06-26 ENCOUNTER — Encounter: Payer: Self-pay | Admitting: Family Medicine

## 2018-06-26 VITALS — BP 169/84 | Ht 66.0 in | Wt 232.0 lb

## 2018-06-26 DIAGNOSIS — M7672 Peroneal tendinitis, left leg: Secondary | ICD-10-CM

## 2018-06-26 NOTE — Patient Instructions (Signed)
Keep the boot with you the next week in case you have a bad day. Start the home exercises 3 sets of 10 once a day with yellow theraband. Icing 15 minutes at a time as needed. Aleve 2 tabs twice a day with food as needed. Arch supports (dr. Jari Sportsman active series, spencos, superfeet) if the exercises aren't helping you enough. Follow up with me in 4 weeks or as needed if you're doing well.

## 2018-06-27 ENCOUNTER — Encounter: Payer: Self-pay | Admitting: Family Medicine

## 2018-06-27 NOTE — Progress Notes (Signed)
PCP: Lizbeth Bark, FNP  Subjective:   2/10: Patient is a 48 y.o. male here for left ankle pain.  Pain began approximately 5 days ago on 06/07/2018.  Patient notes he was wearing boots with untied laces in do some light work outside of his house.  He denies any specific injury, but believes this may have contributed.  He localizes pain primarily to the lateral ankle posterior to the lateral malleolus.  He also notes pain posteriorly over the Achilles.  Pain can be 10/10 and sharp with walking.  No notable swelling or erythema.  No ecchymosis.  No numbness or tingling.  He has not tried any pain medications or ice.  He did apply heat.  2/24: Patient reports he's doing much better. Pain level down to 0/10 level. No longer wearing boot. Some soreness at times laterally. No skin changes.  Past Medical History:  Diagnosis Date  . Carpal tunnel syndrome   . Diabetes mellitus   . DVT (deep venous thrombosis) (HCC)   . Hypertension   . Obesity   . Shoulder pain, bilateral   . Sleep apnea    was fitted for mask and never got machine - too costly    Current Outpatient Medications on File Prior to Visit  Medication Sig Dispense Refill  . amLODipine (NORVASC) 10 MG tablet Take 1 tablet (10 mg total) by mouth daily. (Patient taking differently: Take 1 tablet (10 mg total) by mouth daily.) 90 tablet 3  . aspirin EC 81 MG tablet Take 1 tablet (81 mg total) by mouth daily. 90 tablet 1  . atorvastatin (LIPITOR) 20 MG tablet Take 1 tablet (20 mg total) by mouth daily. (Patient taking differently: Take 1 tablet (20 mg total) by mouth daily.) 90 tablet 3  . fluticasone (FLONASE) 50 MCG/ACT nasal spray SPRAY TWO SPRAYS INTO BOTH NOSTRILS  DAILY 16 g 5  . glucose blood (ONE TOUCH ULTRA TEST) test strip Use as instructed 100 each 1  . ibuprofen (ADVIL,MOTRIN) 600 MG tablet Take 1 tablet (600 mg total) by mouth every 8 (eight) hours as needed (Take with food.). 30 tablet 1  . INVOKANA 300 MG TABS  tablet Take 1 tablet (300 mg total) by mouth daily. MUST MAKE APPT FOR FURTHER REFILLS 30 tablet 0  . Linagliptin-metFORMIN HCl (JENTADUETO) 2.08-998 MG TABS Take 1 tablet by mouth 2 (two) times daily. 180 tablet 3  . lisinopril (PRINIVIL,ZESTRIL) 20 MG tablet Take 1 tablet (20 mg total) by mouth daily. (Patient taking differently: Take 1 tablet (20 mg total) by mouth daily.) 90 tablet 3  . omeprazole (PRILOSEC) 20 MG capsule Take 1 capsule (20 mg total) by mouth daily as needed (heartburn). 30 capsule 3   No current facility-administered medications on file prior to visit.     Past Surgical History:  Procedure Laterality Date  . CARPAL TUNNEL RELEASE Right 12/04/2015   Procedure: RIGHT CARPAL TUNNEL RELEASE;  Surgeon: Betha Loa, MD;  Location: [facility];  Service: Orthopedics;  Laterality: Right;  RIGHT CARPAL TUNNEL RELEASE  . CARPAL TUNNEL RELEASE Right 12/2015   [facility]  . CARPAL TUNNEL RELEASE Left 04/01/2016   Procedure: LEFT CARPAL TUNNEL RELEASE;  Surgeon: Betha Loa, MD;  Location:  [facility];  Service: Orthopedics;  Laterality: Left;  . right hand surgery    . ROTATOR CUFF REPAIR      No Known Allergies  Social History   Socioeconomic History  . Marital status: Married    Spouse name:  Not on file  . Number of children: Not on file  . Years of education: 12+  . Highest education level: Not on file  Occupational History  . Occupation: real Insurance account manager  Social Needs  . Financial resource strain: Not on file  . Food insecurity:    Worry: Not on file    Inability: Not on file  . Transportation needs:    Medical: Not on file    Non-medical: Not on file  Tobacco Use  . Smoking status: Never Smoker  . Smokeless tobacco: Never Used  Substance and Sexual Activity  . Alcohol use: No    Alcohol/week: 0.0 standard drinks  . Drug use: No  . Sexual activity: Not on file  Lifestyle  . Physical activity:    Days per week: Not on file     Minutes per session: Not on file  . Stress: Not on file  Relationships  . Social connections:    Talks on phone: Not on file    Gets together: Not on file    Attends religious service: Not on file    Active member of club or organization: Not on file    Attends meetings of clubs or organizations: Not on file    Relationship status: Not on file  . Intimate partner violence:    Fear of current or ex partner: Not on file    Emotionally abused: Not on file    Physically abused: Not on file    Forced sexual activity: Not on file  Other Topics Concern  . Not on file  Social History Narrative   Lives at home.   Right-handed.   Occasional caffeine use.    Family History  Problem Relation Age of Onset  . Hypertension Mother   . Kidney disease Father   . Hypertension Other   . Hyperlipidemia Other   . Diabetes Other     BP (!) 169/84   Ht 5\' 6"  (1.676 m)   Wt 232 lb (105.2 kg)   BMI 37.45 kg/m   Review of Systems: See HPI above.     Objective:   Gen: NAD, comfortable in exam room  Left ankle: No gross deformity, swelling, ecchymoses FROM with mild pain external rotation.  5/5 strength all directions. TTP minimally peroneal tendon near insertion.  No other tenderness. Negative ant drawer and talar tilt.   Thompsons test negative. NV intact distally.   Assessment & Plan:  1.  Left ankle pain- 2/2 peroneal tendinitis/tenosynovitis, less Achilles tendinitis.  Much improved.  Shown home exercises to do daily.  Icing, aleve.  Arch supports.  F/u in 4 weeks or prn.

## 2018-07-27 ENCOUNTER — Other Ambulatory Visit: Payer: Self-pay | Admitting: Physician Assistant

## 2018-07-27 DIAGNOSIS — I10 Essential (primary) hypertension: Secondary | ICD-10-CM

## 2018-07-27 DIAGNOSIS — Z760 Encounter for issue of repeat prescription: Secondary | ICD-10-CM

## 2018-07-27 DIAGNOSIS — E1141 Type 2 diabetes mellitus with diabetic mononeuropathy: Secondary | ICD-10-CM

## 2018-07-31 ENCOUNTER — Other Ambulatory Visit: Payer: Self-pay | Admitting: Physician Assistant

## 2018-07-31 DIAGNOSIS — E1141 Type 2 diabetes mellitus with diabetic mononeuropathy: Secondary | ICD-10-CM

## 2018-07-31 DIAGNOSIS — Z760 Encounter for issue of repeat prescription: Secondary | ICD-10-CM

## 2018-07-31 DIAGNOSIS — I10 Essential (primary) hypertension: Secondary | ICD-10-CM

## 2018-08-04 ENCOUNTER — Other Ambulatory Visit: Payer: Self-pay | Admitting: Physician Assistant

## 2018-08-04 DIAGNOSIS — I10 Essential (primary) hypertension: Secondary | ICD-10-CM

## 2018-09-05 ENCOUNTER — Other Ambulatory Visit: Payer: Self-pay | Admitting: Physician Assistant

## 2018-09-05 DIAGNOSIS — I10 Essential (primary) hypertension: Secondary | ICD-10-CM

## 2018-09-10 ENCOUNTER — Other Ambulatory Visit: Payer: Self-pay | Admitting: Physician Assistant

## 2018-09-10 DIAGNOSIS — Z760 Encounter for issue of repeat prescription: Secondary | ICD-10-CM

## 2018-09-10 DIAGNOSIS — I10 Essential (primary) hypertension: Secondary | ICD-10-CM

## 2018-09-10 DIAGNOSIS — E1141 Type 2 diabetes mellitus with diabetic mononeuropathy: Secondary | ICD-10-CM

## 2018-09-11 ENCOUNTER — Encounter: Payer: Self-pay | Admitting: Nurse Practitioner

## 2018-09-11 ENCOUNTER — Ambulatory Visit: Payer: 59 | Attending: Nurse Practitioner | Admitting: Nurse Practitioner

## 2018-09-11 DIAGNOSIS — I5032 Chronic diastolic (congestive) heart failure: Secondary | ICD-10-CM

## 2018-09-11 DIAGNOSIS — I10 Essential (primary) hypertension: Secondary | ICD-10-CM | POA: Diagnosis not present

## 2018-09-11 DIAGNOSIS — E1165 Type 2 diabetes mellitus with hyperglycemia: Secondary | ICD-10-CM | POA: Diagnosis not present

## 2018-09-11 DIAGNOSIS — E782 Mixed hyperlipidemia: Secondary | ICD-10-CM

## 2018-09-11 DIAGNOSIS — K219 Gastro-esophageal reflux disease without esophagitis: Secondary | ICD-10-CM

## 2018-09-11 DIAGNOSIS — I2699 Other pulmonary embolism without acute cor pulmonale: Secondary | ICD-10-CM

## 2018-09-11 DIAGNOSIS — IMO0002 Reserved for concepts with insufficient information to code with codable children: Secondary | ICD-10-CM

## 2018-09-11 MED ORDER — LISINOPRIL 20 MG PO TABS: 20.0000 mg | ORAL_TABLET | Freq: Every day | ORAL | 0 refills | Status: DC

## 2018-09-11 MED ORDER — INVOKANA 300 MG PO TABS: 300.0000 mg | ORAL_TABLET | Freq: Every day | ORAL | 2 refills | Status: DC

## 2018-09-11 MED ORDER — ONETOUCH DELICA LANCETS 30G MISC: 1.0000 | Freq: Every day | 11 refills | Status: AC

## 2018-09-11 MED ORDER — AMLODIPINE BESYLATE 10 MG PO TABS: 10.0000 mg | ORAL_TABLET | Freq: Every day | ORAL | 3 refills | Status: DC

## 2018-09-11 MED ORDER — OMEPRAZOLE 20 MG PO CPDR: 20.0000 mg | DELAYED_RELEASE_CAPSULE | Freq: Every day | ORAL | 3 refills | Status: DC | PRN

## 2018-09-11 MED ORDER — ATORVASTATIN CALCIUM 20 MG PO TABS: 20.0000 mg | ORAL_TABLET | Freq: Every day | ORAL | 1 refills | Status: DC

## 2018-09-11 MED ORDER — GLUCOSE BLOOD VI STRP: ORAL_STRIP | 12 refills | Status: DC

## 2018-09-11 MED ORDER — ONETOUCH DELICA LANCING DEV MISC: 99 refills | Status: DC

## 2018-09-11 MED ORDER — LINAGLIPTIN-METFORMIN HCL 2.5-1000 MG PO TABS: 1.0000 | ORAL_TABLET | Freq: Two times a day (BID) | ORAL | 3 refills | Status: DC

## 2018-09-11 NOTE — Progress Notes (Signed)
Virtual Visit via Telephone Note Due to national recommendations of social distancing due to [place], telehealth visit is felt to be most appropriate for this patient at this time.  I discussed the limitations, risks, security and privacy concerns of performing an evaluation and management service by telephone and the availability of in person appointments. I also discussed with the patient that there may be a patient responsible charge related to this service. The patient expressed understanding and agreed to proceed.    I connected with Jonetta Speak on 09/11/18  at   3:30 PM EDT  EDT by telephone and verified that I am speaking with the correct person using two identifiers.   Consent I discussed the limitations, risks, security and privacy concerns of performing an evaluation and management service by telephone and the availability of in person appointments. I also discussed with the patient that there may be a patient responsible charge related to this service. The patient expressed understanding and agreed to proceed.   Location of Patient: [place] of Provider: [place] [facility] [employer] Office    Persons participating in Telemedicine visit: [name] FNP-BC [place]    History of Present Illness: Telemedicine visit for: Establish care Past Medical History:  Diagnosis Date  . Carpal tunnel syndrome   . Diabetes mellitus   . Hypertension   . Obesity   . Pulmonary embolism ([place]) 2013   He has a history of HTN.  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 A repeat scan done in 2017 did not demonstrate evidence of emboli. Neither study demonstrated any coronary calcifications.   . Shoulder pain, bilateral   . Sleep apnea    was fitted for mask and never got machine - too costly  Currently seeing Sports Medicine for peroneal tendinitis.   DM TYPE 2 He has not been checking his glucose levels in  several months as he does not have any strips or lancets. Currently also out of Invokana 300 daily, Jentadueto 2.08-998 mg BID. He is on an ACE and Statin although has been out of these medications for the same length of time. Denies any hypo or hyperglycemic symptoms. Overdue for eye exam.   Lab Results  Component Value Date   HGBA1C 8.1 06/29/2017     Essential HTN  Chronic. He does not monitor his blood pressure at home and out of medications. Current medications prescribed: Amlodipine 5m and lisinopril 221mdaily. Denies chest pain, shortness of breath, palpitations, lightheadedness, dizziness, headaches or BLE edema. He is not diet or exercise compliant.  BP Readings from Last 3 Encounters:  06/26/18 (!) 169/84  06/20/18 133/90  06/12/18 (!) 143/85   Mixed Hyperlipidemia LDL at goal. Has been out of atorvastatin 2080mDenies any statin intolerance of myalgias.  Lab Results  Component Value Date   LDLPowell 06/29/2017     Past Surgical History:  Procedure Laterality Date  . CARPAL TUNNEL RELEASE Right 12/04/2015   Procedure: RIGHT CARPAL TUNNEL RELEASE;  Surgeon: KevLeanora CoverD;  [facility];  Laterality: Right;  RIGHT CARPAL TUNNEL RELEASE  . CARPAL TUNNEL RELEASE Right 12/2015   [facility]  . CARPAL TUNNEL RELEASE Left 04/01/2016   Procedure: LEFT CARPAL TUNNEL RELEASE;  Surgeon: KevLeanora CoverD;  [facility];  Laterality: Left;  . right hand surgery    . ROTATOR CUFF REPAIR  Family History  Problem Relation Age of Onset  . Hypertension Mother   . Kidney disease Father   . Hypertension Other   . Hyperlipidemia Other   . Diabetes Other     Social History   Socioeconomic History  . Marital status: Married    Spouse name: Not on file  . Number of children: Not on file  . Years of education: 12+  . Highest education level: Not on file  Occupational History  . Occupation:  real Conservation officer, historic buildings  Social Needs  . Financial resource strain: Not on file  . Food insecurity:    Worry: Not on file    Inability: Not on file  . Transportation needs:    Medical: Not on file    Non-medical: Not on file  Tobacco Use  . Smoking status: Never Smoker  . Smokeless tobacco: Never Used  Substance and Sexual Activity  . Alcohol use: No    Alcohol/week: 0.0 standard drinks  . Drug use: No  . Sexual activity: Yes  Lifestyle  . Physical activity:    Days per week: Not on file    Minutes per session: Not on file  . Stress: Not on file  Relationships  . Social connections:    Talks on phone: Not on file    Gets together: Not on file    Attends religious service: Not on file    Active member of club or organization: Not on file    Attends meetings of clubs or organizations: Not on file    Relationship status: Not on file  Other Topics Concern  . Not on file  Social History Narrative   Lives at home.   Right-handed.   Occasional caffeine use.     Observations/Objective: Awake, alert and oriented x 3   Review of Systems  Constitutional: Negative for fever, malaise/fatigue and weight loss.  HENT: Negative.  Negative for nosebleeds.   Eyes: Negative.  Negative for blurred vision, double vision and photophobia.  Respiratory: Negative.  Negative for cough and shortness of breath.   Cardiovascular: Negative.  Negative for chest pain, palpitations and leg swelling.  Gastrointestinal: Positive for heartburn. Negative for nausea and vomiting.  Musculoskeletal: Negative.  Negative for myalgias.  Neurological: Negative.  Negative for dizziness, focal weakness, seizures and headaches.  Psychiatric/Behavioral: Negative.  Negative for suicidal ideas.   Assessment and Plan: Nezar was seen today for establish care.  Diagnoses and all orders for this visit:  Diabetes mellitus type 2, uncontrolled, with complications ([place]) -     glucose blood test strip; Use as  instructed. Inject into the skin once daily. -     OneTouch Delica Lancets 94M MISC; 1 applicator by Subdermal route daily for 30 days. -     Lancet Devices (ONE TOUCH DELICA LANCING DEV) MISC; Use as instructed. Inject into the skin once daily -     linaGLIPtin-metFORMIN HCl (JENTADUETO) 2.08-998 MG TABS; Take 1 tablet by mouth 2 (two) times daily. -     INVOKANA 300 MG TABS tablet; Take 1 tablet (300 mg total) by mouth daily. -     Hemoglobin A1c -     Ambulatory referral to Ophthalmology  Essential hypertension -     CBC -     CMP14+EGFR -     lisinopril (ZESTRIL) 20 MG tablet; Take 1 tablet (20 mg total) by mouth daily. -     amLODipine (NORVASC) 10 MG tablet; Take 1 tablet (10 mg total) by mouth daily.  Continue all antihypertensives as prescribed.  Remember to bring in your blood pressure log with you for your follow up appointment.  DASH/Mediterranean Diets are healthier choices for HTN.    Mixed hyperlipidemia -     Lipid panel -     atorvastatin (LIPITOR) 20 MG tablet; Take 1 tablet (20 mg total) by mouth daily. INSTRUCTIONS: Work on a low fat, heart healthy diet and participate in regular aerobic exercise program by working out at least 150 minutes per week; 5 days a week-30 minutes per day. Avoid red meat, fried foods. junk foods, sodas, sugary drinks, unhealthy snacking, alcohol and smoking.  Drink at least 48oz of water per day and monitor your carbohydrate intake daily.    Gastroesophageal reflux disease without esophagitis -     omeprazole (PRILOSEC) 20 MG capsule; Take 1 capsule (20 mg total) by mouth daily as needed (heartburn). INSTRUCTIONS: Avoid GERD Triggers: acidic, spicy or fried foods, caffeine, coffee, sodas,  alcohol and chocolate.   Other pulmonary embolism without acute cor pulmonale, unspecified chronicity (HCC)  Chronic diastolic heart failure ([place])    Follow Up Instructions Return in about 2 months (around 11/11/2018).     I discussed the assessment  and treatment plan with the patient. The patient was provided an opportunity to ask questions and all were answered. The patient agreed with the plan and demonstrated an understanding of the instructions.   The patient was advised to call back or seek an in-person evaluation if the symptoms worsen or if the condition fails to improve as anticipated.  I provided 26 minutes of non-face-to-face time during this encounter including median intraservice time, reviewing previous notes, labs, imaging, medications and explaining diagnosis and management.  [name], FNP-BC

## 2018-09-11 NOTE — Telephone Encounter (Signed)
Patient has appointment with Zelda to reestablish care later today (09/11/18). Will defer refill requests to her.

## 2018-09-12 LAB — LIPID PANEL
Chol/HDL Ratio: 3.7 ratio (ref 0.0–5.0)
Cholesterol, Total: 147 mg/dL (ref 100–199)
HDL: 40 mg/dL (ref >39)
LDL Calculated: 81 mg/dL (ref 0–99)
Triglycerides: 131 mg/dL (ref 0–149)
VLDL Cholesterol Cal: 26 mg/dL (ref 5–40)

## 2018-09-12 LAB — CMP14+EGFR
ALT: 62 IU/L — ABNORMAL HIGH (ref 0–44)
AST: 32 IU/L (ref 0–40)
Albumin/Globulin Ratio: 1.9 (ref 1.2–2.2)
Albumin: 4.8 g/dL (ref 4.0–5.0)
Alkaline Phosphatase: 63 IU/L (ref 39–117)
BUN/Creatinine Ratio: 13 (ref 9–20)
BUN: 13 mg/dL (ref 6–24)
Bilirubin Total: 1 mg/dL (ref 0.0–1.2)
CO2: 24 mmol/L (ref 20–29)
Calcium: 9.7 mg/dL (ref 8.7–10.2)
Chloride: 99 mmol/L (ref 96–106)
Creatinine, Ser: 1.04 mg/dL (ref 0.76–1.27)
GFR calc Af Amer: 97 mL/min/{1.73_m2} (ref >59)
GFR calc non Af Amer: 84 mL/min/{1.73_m2} (ref >59)
Globulin, Total: 2.5 g/dL (ref 1.5–4.5)
Glucose: 186 mg/dL — ABNORMAL HIGH (ref 65–99)
Potassium: 4.4 mmol/L (ref 3.5–5.2)
Sodium: 136 mmol/L (ref 134–144)
Total Protein: 7.3 g/dL (ref 6.0–8.5)

## 2018-09-12 LAB — CBC
Hematocrit: 47.7 % (ref 37.5–51.0)
Hemoglobin: 16.1 g/dL (ref 13.0–17.7)
MCH: 25.2 pg — ABNORMAL LOW (ref 26.6–33.0)
MCHC: 33.8 g/dL (ref 31.5–35.7)
MCV: 75 fL — ABNORMAL LOW (ref 79–97)
Platelets: 258 10*3/uL (ref 150–450)
RBC: 6.39 x10E6/uL — ABNORMAL HIGH (ref 4.14–5.80)
RDW: 14.4 % (ref 11.6–15.4)
WBC: 5 10*3/uL (ref 3.4–10.8)

## 2018-09-12 LAB — HEMOGLOBIN A1C
Est. average glucose Bld gHb Est-mCnc: 206 mg/dL
Hgb A1c MFr Bld: 8.8 % — ABNORMAL HIGH (ref 4.8–5.6)

## 2018-10-09 ENCOUNTER — Ambulatory Visit: Payer: 59 | Admitting: Nurse Practitioner

## 2018-12-04 ENCOUNTER — Other Ambulatory Visit: Payer: Self-pay | Admitting: Nurse Practitioner

## 2018-12-04 DIAGNOSIS — I10 Essential (primary) hypertension: Secondary | ICD-10-CM

## 2018-12-13 ENCOUNTER — Other Ambulatory Visit: Payer: Self-pay

## 2018-12-13 ENCOUNTER — Ambulatory Visit: Payer: 59 | Attending: Nurse Practitioner | Admitting: Physician Assistant

## 2018-12-13 DIAGNOSIS — R519 Headache, unspecified: Secondary | ICD-10-CM

## 2018-12-13 DIAGNOSIS — R51 Headache: Secondary | ICD-10-CM | POA: Diagnosis not present

## 2018-12-13 DIAGNOSIS — I10 Essential (primary) hypertension: Secondary | ICD-10-CM

## 2018-12-13 MED ORDER — FLUTICASONE PROPIONATE 50 MCG/ACT NA SUSP: 2.0000 | Freq: Every day | NASAL | 6 refills | Status: DC

## 2018-12-13 MED ORDER — LORATADINE 10 MG PO TBDP: 10.0000 mg | ORAL_TABLET | Freq: Every day | ORAL | 3 refills | Status: DC

## 2018-12-13 MED ORDER — AMLODIPINE BESYLATE 10 MG PO TABS: 10.0000 mg | ORAL_TABLET | Freq: Every day | ORAL | 3 refills | Status: DC

## 2018-12-13 MED ORDER — IBUPROFEN 600 MG PO TABS: 600.0000 mg | ORAL_TABLET | Freq: Three times a day (TID) | ORAL | 1 refills | Status: DC | PRN

## 2018-12-13 MED ORDER — LISINOPRIL 20 MG PO TABS: 20.0000 mg | ORAL_TABLET | Freq: Every day | ORAL | 1 refills | Status: DC

## 2018-12-13 NOTE — Progress Notes (Signed)
Pt. Stated he is having headaches. Pt. Stated it is located on his left side and been going on for 5 days.  Pt. Stated the headaches comes and go.   Pt. Is requesting Claritin refills.

## 2018-12-13 NOTE — Progress Notes (Signed)
Virtual Visit via Telephone Note  I connected with Scott [name] on 12/13/18 at  9:10 AM EDT by telephone and verified that I am speaking with the correct person using two identifiers.   I discussed the limitations, risks, security and privacy concerns of performing an evaluation and management service by telephone and the availability of in person appointments. I also discussed with the patient that there may be a patient responsible charge related to this service. The patient expressed understanding and agreed to proceed.  Patient location:  home My Location:  [place] office Persons on the call:  Me and the patient   History of Present Illness: L sided HA.  No vision changes.  HA is on the L side.  Tylenol has helped little.  HA is on and off.  No paresthesias or motor s/sx.  He is using flonase.  Allergies are bothering him  BP at home 155/90 but hadn't taken meds that day.  BP now(checked while on the phone-166/101).  He is only taking amlodipine but is also supposed to be on lisinopril according to his chart.    Blood sugars: 122-150.  147 is fasting today and checked while on the phone    Observations/Objective:  A&Ox3   Assessment and Plan:  1. Essential hypertension Not controlled-but hasn't been taking lisinopril(only taking Amlodipine)  Resume Lisinopril and continue amlodipine.  Check BP bid and record.   - lisinopril (ZESTRIL) 20 MG tablet; Take 1 tablet (20 mg total) by mouth daily.  Dispense: 90 tablet; Refill: 1 - amLODipine (NORVASC) 10 MG tablet; Take 1 tablet (10 mg total) by mouth daily.  Dispense: 90 tablet; Refill: 3  2. Nonintractable headache, unspecified chronicity pattern, unspecified headache type Likely related to #1.  No red flags - loratadine (CLARITIN REDITABS) 10 MG dissolvable tablet; Take 1 tablet (10 mg total) by mouth daily.  Dispense: 30 tablet; Refill: 3 - fluticasone (FLONASE) 50 MCG/ACT nasal spray; Place 2 sprays into both nostrils daily.   Dispense: 16 g; Refill: 6 - ibuprofen (ADVIL) 600 MG tablet; Take 1 tablet (600 mg total) by mouth every 8 (eight) hours as needed (Take with food.).  Dispense: 30 tablet; Refill: 1   Follow Up Instructions: 1 week with BP readings   I discussed the assessment and treatment plan with the patient. The patient was provided an opportunity to ask questions and all were answered. The patient agreed with the plan and demonstrated an understanding of the instructions.   The patient was advised to call back or seek an in-person evaluation if the symptoms worsen or if the condition fails to improve as anticipated.  I provided 17 minutes of non-face-to-face time during this encounter.   Freeman Caldron, PA-C  Patient ID: Scott [name], male   DOB: 11-04-1970, 48 y.o.   MRN: [id number]

## 2018-12-20 ENCOUNTER — Other Ambulatory Visit: Payer: Self-pay

## 2018-12-20 ENCOUNTER — Ambulatory Visit: Payer: 59 | Attending: Nurse Practitioner | Admitting: Physician Assistant

## 2018-12-20 VITALS — BP 147/69

## 2018-12-20 DIAGNOSIS — R519 Headache, unspecified: Secondary | ICD-10-CM

## 2018-12-20 DIAGNOSIS — R51 Headache: Secondary | ICD-10-CM

## 2018-12-20 DIAGNOSIS — I10 Essential (primary) hypertension: Secondary | ICD-10-CM

## 2018-12-20 NOTE — Progress Notes (Signed)
Patient ID: Scott [name], male   DOB: 04-Nov-1970, 48 y.o.   MRN: [id number] Virtual Visit via Telephone Note  I connected with Scott [name] on 12/20/18 at  4:10 PM EDT by telephone and verified that I am speaking with the correct person using two identifiers.   I discussed the limitations, risks, security and privacy concerns of performing an evaluation and management service by telephone and the availability of in person appointments. I also discussed with the patient that there may be a patient responsible charge related to this service. The patient expressed understanding and agreed to proceed.  Patient location:  home My Location:  [place] office Persons on the call:  Me and the patient.   History of Present Illness: See last note.  BP is starting to improve once restarting lisinopril.  Now he is taking lisinopril and amlodipine.  HA have improved but still having low grade HA on L side of head.  No vision changes.  No neurological or motor deficits.  No FH aneurysm.  No N/V with HA.  No photophobia.  No aura.    BP 147/89   Observations/Objective:  A&Ox3   Assessment and Plan: 1. Essential hypertension Continue both amlodipine and lisinopril and check BP daily and record.  We will do another televisit in 2 weeks to see if BP is at goal and HA are resolved.  HA have improved since adding Lisinopril.    2. Nonintractable headache, unspecified chronicity pattern, unspecified headache type In the event BP control does not eradicate the HA, I will have him see neurology.  If HA resolve with BP control, he will cancel this appt.  If HA change, become severe, or are accompanied with vision changes/neurological deficits, he has been instructed to call 911.  Patient expresses understanding - Ambulatory referral to Neurology    Follow Up Instructions: televisit in 2 weeks   I discussed the assessment and treatment plan with the patient. The patient was provided an opportunity to ask  questions and all were answered. The patient agreed with the plan and demonstrated an understanding of the instructions.   The patient was advised to call back or seek an in-person evaluation if the symptoms worsen or if the condition fails to improve as anticipated.  I provided 14 minutes of non-face-to-face time during this encounter.   Freeman Caldron, PA-C

## 2018-12-20 NOTE — Progress Notes (Signed)
Patient verified DOB Patient has taken medication today. Patient has eaten today. Patient denies pain at this time. BP: 147/89 after 2 hrs after medication.

## 2018-12-21 ENCOUNTER — Encounter: Payer: Self-pay | Admitting: Physician Assistant

## 2018-12-21 ENCOUNTER — Other Ambulatory Visit: Payer: Self-pay | Admitting: Family Medicine

## 2018-12-21 DIAGNOSIS — Z760 Encounter for issue of repeat prescription: Secondary | ICD-10-CM

## 2019-01-03 ENCOUNTER — Ambulatory Visit: Payer: 59 | Attending: Nurse Practitioner | Admitting: Physician Assistant

## 2019-01-03 DIAGNOSIS — I10 Essential (primary) hypertension: Secondary | ICD-10-CM

## 2019-01-03 DIAGNOSIS — R519 Headache, unspecified: Secondary | ICD-10-CM

## 2019-01-03 DIAGNOSIS — R51 Headache: Secondary | ICD-10-CM | POA: Diagnosis not present

## 2019-01-03 MED ORDER — LISINOPRIL 40 MG PO TABS: 40.0000 mg | ORAL_TABLET | Freq: Every day | ORAL | 3 refills | Status: DC

## 2019-01-03 NOTE — Progress Notes (Signed)
Virtual Visit via Telephone Note  I connected with Scott [name] on 01/03/19 at  4:10 PM EDT by telephone and verified that I am speaking with the correct person using two identifiers.   I discussed the limitations, risks, security and privacy concerns of performing an evaluation and management service by telephone and the availability of in person appointments. I also discussed with the patient that there may be a patient responsible charge related to this service. The patient expressed understanding and agreed to proceed.  Patient location:  work My Location:  [place] office Persons on the call:  Me and the patient   History of Present Illness: Following up with patient today about HA and BP.  HA are less often than before.  He has a neurology appt 01/23/2019.  He is now taking lisinopril 20mg  daily and amlodipine 10mg  daily.  BP OOO still running 140-150/90-100.    Blood sugars ~120s-160    Observations/Objective: A&Ox3   Assessment and Plan: 1. Essential hypertension Not at goal-increase dose from 20mg  to - lisinopril (ZESTRIL) 40 MG tablet; Take 1 tablet (40 mg total) by mouth daily.  Dispense: 90 tablet; Refill: 3 -continue amlodipine 10mg  daily  2. Nonintractable headache, unspecified chronicity pattern, unspecified headache type Improving with better BP control but will still have him see neurology.  Call 911 if severe or HA change    Follow Up Instructions: See PCP 1 month for A1C/BP check   I discussed the assessment and treatment plan with the patient. The patient was provided an opportunity to ask questions and all were answered. The patient agreed with the plan and demonstrated an understanding of the instructions.   The patient was advised to call back or seek an in-person evaluation if the symptoms worsen or if the condition fails to improve as anticipated.  I provided 6 minutes of non-face-to-face time during this encounter.   Freeman Caldron, PA-C  Patient  ID: Scott [name], male   DOB: 1970-11-21, 48 y.o.   MRN: [id number]

## 2019-01-23 ENCOUNTER — Other Ambulatory Visit: Payer: Self-pay

## 2019-01-23 ENCOUNTER — Ambulatory Visit (INDEPENDENT_AMBULATORY_CARE_PROVIDER_SITE_OTHER): Payer: 59 | Admitting: Neurology

## 2019-01-23 ENCOUNTER — Encounter: Payer: Self-pay | Admitting: Neurology

## 2019-01-23 VITALS — BP 151/101 | HR 72 | Temp 98.2°F | Ht 66.0 in | Wt 232.5 lb

## 2019-01-23 DIAGNOSIS — G4730 Sleep apnea, unspecified: Secondary | ICD-10-CM

## 2019-01-23 DIAGNOSIS — R51 Headache: Secondary | ICD-10-CM

## 2019-01-23 DIAGNOSIS — M1990 Unspecified osteoarthritis, unspecified site: Secondary | ICD-10-CM | POA: Insufficient documentation

## 2019-01-23 DIAGNOSIS — G43709 Chronic migraine without aura, not intractable, without status migrainosus: Secondary | ICD-10-CM

## 2019-01-23 DIAGNOSIS — IMO0002 Reserved for concepts with insufficient information to code with codable children: Secondary | ICD-10-CM | POA: Insufficient documentation

## 2019-01-23 DIAGNOSIS — R519 Headache, unspecified: Secondary | ICD-10-CM | POA: Insufficient documentation

## 2019-01-23 HISTORY — DX: Reserved for concepts with insufficient information to code with codable children: IMO0002

## 2019-01-23 NOTE — Progress Notes (Signed)
[name] DOB: 10-May-1970  Chief Complaint  Patient presents with  . New Patient (Initial Visit)    EMG room 4, alone. Internal referral from Bertram Denver, NP for headaches  . PCP    Bertram Denver, NP  . Headache    Located on left side. Worsening sinus issues cause worsening headaches. Not sure if this is related. Amlodipine causes HA's.      HISTORICAL  Scott [name] is a 48 year old male, seen in request by her primary care nurse practitioner Bertram Denver for evaluation of headaches, initial evaluation was on January 23, 2019.  I have reviewed and summarized the referring note from the referring physician.  He had a past medical history of hypertension, diabetes, hyperlipidemia, obesity, previously was diagnosed with mild obstructive sleep apnea  He had a history of migraine headaches, multiple migraine are holoacranial pressure headache with light noise sensitivity, nauseous, usually respond somewhat to over-the-counter medications.  Since July 2020, he described different kind of headaches, it is constant low-grade on the left side, he also described stuffed nose, postnasal drainage, he stopped his blood pressure medicine amlodipine 2 weeks ago, his symptoms seems to improve a little bit.  He denies light noise sensitivity, or nauseous with his left-sided headaches.  He has been taking frequent Sudafed every other day over the past 2 months, which only provide temporary relief of his symptoms  About 5 years ago, he had not snoring, daytime sleepiness, fatigue, had outside sleep study was diagnosed with obstructive sleep apnea, because of the cost of the CPAP machine, he did not pursue further.  Now he complains of loud snoring, difficulty sleeping, frequent awakening at nighttime,  REVIEW OF SYSTEMS: Full 14 system review of systems performed and notable only for as above All other review of systems were negative.  ALLERGIES: No Known Allergies  HOME  MEDICATIONS: Current Outpatient Medications  Medication Sig Dispense Refill  . amLODipine (NORVASC) 10 MG tablet Take 1 tablet (10 mg total) by mouth daily. 90 tablet 3  . ASPIRIN LOW DOSE 81 MG EC tablet TAKE ONE TABLET BY MOUTH DAILY 30 tablet 0  . atorvastatin (LIPITOR) 20 MG tablet Take 1 tablet (20 mg total) by mouth daily. 90 tablet 1  . fluticasone (FLONASE) 50 MCG/ACT nasal spray Place 2 sprays into both nostrils daily. 16 g 6  . glucose blood test strip Use as instructed. Inject into the skin once daily. 100 each 12  . ibuprofen (ADVIL) 600 MG tablet Take 1 tablet (600 mg total) by mouth every 8 (eight) hours as needed (Take with food.). 30 tablet 1  . INVOKANA 300 MG TABS tablet Take 1 tablet (300 mg total) by mouth daily. 90 tablet 2  . Lancet Devices (ONE TOUCH DELICA LANCING DEV) MISC Use as instructed. Inject into the skin once daily 1 each prn  . linaGLIPtin-metFORMIN HCl (JENTADUETO) 2.08-998 MG TABS Take 1 tablet by mouth 2 (two) times daily. 180 tablet 3  . lisinopril (ZESTRIL) 40 MG tablet Take 1 tablet (40 mg total) by mouth daily. 90 tablet 3  . loratadine (CLARITIN REDITABS) 10 MG dissolvable tablet Take 1 tablet (10 mg total) by mouth daily. 30 tablet 3  . omeprazole (PRILOSEC) 20 MG capsule Take 1 capsule (20 mg total) by mouth daily as needed (heartburn). 30 capsule 3   No current facility-administered medications for this visit.     PAST MEDICAL HISTORY: Past Medical History:  Diagnosis Date  . Carpal tunnel syndrome   .  Diabetes mellitus   . Hypertension   . Obesity   . Pulmonary embolism ([place]) 2013   He has a history of HTN.  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 A repeat scan done in 2017 did not demonstrate evidence of emboli. Neither study demonstrated any coronary calcifications.   . Shoulder pain, bilateral   . Sleep apnea    was fitted for mask and never got machine - too costly    PAST SURGICAL HISTORY: Past  Surgical History:  Procedure Laterality Date  . CARPAL TUNNEL RELEASE Right 12/04/2015   Procedure: RIGHT CARPAL TUNNEL RELEASE;  Surgeon: Leanora Cover, MD;  Location: [place];  Service: Orthopedics;  Laterality: Right;  RIGHT CARPAL TUNNEL RELEASE  . CARPAL TUNNEL RELEASE Right 12/2015   [facility]  . CARPAL TUNNEL RELEASE Left 04/01/2016   Procedure: LEFT CARPAL TUNNEL RELEASE;  Surgeon: Leanora Cover, MD;  Location: [place];  Service: Orthopedics;  Laterality: Left;  . right hand surgery    . ROTATOR CUFF REPAIR      FAMILY HISTORY: Family History  Problem Relation Age of Onset  . Hypertension Mother   . Kidney disease Father   . Hypertension Other   . Hyperlipidemia Other   . Diabetes Other     SOCIAL HISTORY: Social History   Socioeconomic History  . Marital status: Married    Spouse name: Not on file  . Number of children: Not on file  . Years of education: 12+  . Highest education level: Not on file  Occupational History  . Occupation: real Conservation officer, historic buildings  Social Needs  . Financial resource strain: Not on file  . Food insecurity    Worry: Not on file    Inability: Not on file  . Transportation needs    Medical: Not on file    Non-medical: Not on file  Tobacco Use  . Smoking status: Never Smoker  . Smokeless tobacco: Never Used  Substance and Sexual Activity  . Alcohol use: No    Alcohol/week: 0.0 standard drinks  . Drug use: No  . Sexual activity: Yes  Lifestyle  . Physical activity    Days per week: Not on file    Minutes per session: Not on file  . Stress: Not on file  Relationships  . Social Herbalist on phone: Not on file    Gets together: Not on file    Attends religious service: Not on file    Active member of club or organization: Not on file    Attends meetings of clubs or organizations: Not on file    Relationship status: Not on file  . Intimate partner violence    Fear of current or ex partner: Not on  file    Emotionally abused: Not on file    Physically abused: Not on file    Forced sexual activity: Not on file  Other Topics Concern  . Not on file  Social History Narrative   Lives at home.   Right-handed.   Occasional caffeine use.   Tea every other day     PHYSICAL EXAM   Vitals:   01/23/19 0712  BP: (!) 151/101  Pulse: 72  Temp: 98.2 F (36.8 C)  Weight: 232 lb 8 oz (105.5 kg)  Height: 5\' 6"  (1.676 m)    Not recorded      Body mass index is 37.53 kg/m.  PHYSICAL EXAMNIATION:  Gen: NAD,  conversant, well nourised, well groomed                     Cardiovascular: Regular rate rhythm, no peripheral edema, warm, nontender. Eyes: Conjunctivae clear without exudates or hemorrhage Neck: Supple, no carotid bruits. Pulmonary: Clear to auscultation bilaterally   NEUROLOGICAL EXAM:  MENTAL STATUS: Speech:    Speech is normal; fluent and spontaneous with normal comprehension.  Cognition:     Orientation to time, place and person     Normal recent and remote memory     Normal Attention span and concentration     Normal Language, naming, repeating,spontaneous speech     Fund of knowledge   CRANIAL NERVES: CN II: Visual fields are full to confrontation.  Pupils are round equal and briskly reactive to light. CN III, IV, VI: extraocular movement are normal. No ptosis. CN V: Facial sensation is intact to pinprick in all 3 divisions bilaterally. Corneal responses are intact.  CN VII: Face is symmetric with normal eye closure and smile. CN VIII: Hearing is normal to causal conversation. CN IX, X: Palate elevates symmetrically. Phonation is normal. CN XI: Head turning and shoulder shrug are intact CN XII: Tongue is midline with normal movements and no atrophy.  Narrow nasopharyngeal  MOTOR: There is no pronator drift of out-stretched arms. Muscle bulk and tone are normal. Muscle strength is normal.  REFLEXES: Reflexes are 2+ and symmetric at the biceps, triceps,  knees, and ankles. Plantar responses are flexor.  SENSORY: Intact to light touch, pinprick,  COORDINATION: Rapid alternating movements and fine finger movements are intact. There is no dysmetria on finger-to-nose and heel-knee-shin.    GAIT/STANCE: Posture is normal. Gait is steady with normal steps, base, arm swing, and turning. Heel and toe walking are normal. Tandem gait is normal.  Romberg is absent.   DIAGNOSTIC DATA (LABS, IMAGING, TESTING) - I reviewed patient records, labs, notes, testing and imaging myself where available.   ASSESSMENT AND PLAN  Scott [name] is a 48 y.o. male   Chronic migraine headaches New onset daily left-sided headaches  MRI of the brain to rule out structural lesion  Stop frequent over-the-counter medication including Sudafed use  Aleve as needed  Obstructive sleep apnea  Repeat sleep study  Levert Feinstein, M.D. Ph.D.  [facility] [address], [address] [place], [place] [address] Ph: [phone] Fax: [phone]  CC: Referring Provider

## 2019-01-25 ENCOUNTER — Other Ambulatory Visit: Payer: Self-pay | Admitting: Neurology

## 2019-01-25 MED ORDER — ALPRAZOLAM 1 MG PO TABS: ORAL_TABLET | ORAL | 0 refills | Status: DC

## 2019-01-25 NOTE — Telephone Encounter (Signed)
Patient returned my call he is scheduled at [facility] for 02/14/19. He also informed me he is slightly claustrophobic and would like something to help him. He is aware to have a driver.

## 2019-01-25 NOTE — Telephone Encounter (Signed)
I spoke to the patient to confirm he has no medication allergies.  He is aware that he will need a driver with any sedating medications.  Xanax 1mg , per MRI protocol, sent to Dr. Krista Blue for approval.

## 2019-01-25 NOTE — Addendum Note (Signed)
Addended by: Noberto Retort C on: 01/25/2019 01:06 PM   Modules accepted: Orders

## 2019-01-25 NOTE — Telephone Encounter (Signed)
LVM for pt to call back about scheduling [facility] Auth: [id number] (exp. 01/24/19 to 03/10/19)

## 2019-02-09 ENCOUNTER — Ambulatory Visit: Payer: 59 | Attending: Nurse Practitioner | Admitting: Nurse Practitioner

## 2019-02-09 ENCOUNTER — Other Ambulatory Visit: Payer: Self-pay

## 2019-02-09 ENCOUNTER — Encounter: Payer: Self-pay | Admitting: Nurse Practitioner

## 2019-02-09 VITALS — BP 138/89 | HR 88 | Temp 98.6°F | Ht 66.0 in | Wt 237.0 lb

## 2019-02-09 DIAGNOSIS — E1165 Type 2 diabetes mellitus with hyperglycemia: Secondary | ICD-10-CM | POA: Diagnosis not present

## 2019-02-09 DIAGNOSIS — E118 Type 2 diabetes mellitus with unspecified complications: Secondary | ICD-10-CM | POA: Diagnosis not present

## 2019-02-09 DIAGNOSIS — E782 Mixed hyperlipidemia: Secondary | ICD-10-CM

## 2019-02-09 DIAGNOSIS — I10 Essential (primary) hypertension: Secondary | ICD-10-CM

## 2019-02-09 DIAGNOSIS — IMO0002 Reserved for concepts with insufficient information to code with codable children: Secondary | ICD-10-CM

## 2019-02-09 LAB — POCT GLYCOSYLATED HEMOGLOBIN (HGB A1C): Hemoglobin A1C: 8.4 % — AB (ref 4.0–5.6)

## 2019-02-09 LAB — GLUCOSE, POCT (MANUAL RESULT ENTRY): POC Glucose: 281 mg/dl — AB (ref 70–99)

## 2019-02-09 NOTE — Progress Notes (Signed)
Assessment & Plan:  Scott [name] was seen today for follow-up.  Diagnoses and all orders for this visit:  Diabetes mellitus type 2, uncontrolled, with complications (HCC) -     Glucose (CBG) -     HgB A1c -     Ambulatory referral to diabetic education Continue blood sugar control as discussed in office today, low carbohydrate diet, and regular physical exercise as tolerated, 150 minutes per week (30 min each day, 5 days per week, or 50 min 3 days per week). Keep blood sugar logs with fasting goal of 90-130 mg/dl, post prandial (after you eat) less than 180.  For Hypoglycemia: BS <60 and Hyperglycemia BS >400; contact the clinic ASAP. Annual eye exams and foot exams are recommended.   Essential hypertension Continue all antihypertensives as prescribed.  Remember to bring in your blood pressure log with you for your follow up appointment.  DASH/Mediterranean Diets are healthier choices for HTN.   Mixed hyperlipidemia INSTRUCTIONS: Work on a low fat, heart healthy diet and participate in regular aerobic exercise program by working out at least 150 minutes per week; 5 days a week-30 minutes per day. Avoid red meat, fried foods. junk foods, sodas, sugary drinks, unhealthy snacking, alcohol and smoking.  Drink at least 48oz of water per day and monitor your carbohydrate intake daily.     Patient has been counseled on age-appropriate routine health concerns for screening and prevention. These are reviewed and up-to-date. Referrals have been placed accordingly. Immunizations are up-to-date or declined.    Subjective:   Chief Complaint  Patient presents with  . Follow-up    Pt. is here to follow up on diabetes.    HPI Scott [name] 48 y.o. male presents to office today for DM and HTN f/u.   has a past medical history of Carpal tunnel syndrome, Diabetes mellitus, Hypertension, Obesity, Pulmonary embolism (HCC) (2013), Shoulder pain, bilateral, and Sleep apnea.   Essential Hypertension  Well controlled. Taking amlodipine 10mg  , lisinopril 40 mg daily as prescribed. Denies chest pain, shortness of breath, palpitations, lightheadedness, dizziness, headaches or BLE edema. He also monitors his blood pressure at home.  BP Readings from Last 3 Encounters:  02/09/19 138/89  01/23/19 (!) 151/101  12/20/18 (!) 147/69   DM TYPE 2 Better control but still not at goal. Knowledge deficit in regard to carb intake. Will refer to [facility]. Currently taking invokana 300 mg daily as prescribed but has not been taking jentadueto 2.08-998 mg BID. Instead only taking once a day. States he was unaware it was a twice a day medication. Instructed to monitor blood glucose levels at least 2-3 times per day. Needs to bring meter for meter check for next visit.  Lab Results  Component Value Date   HGBA1C 8.4 (A) 02/09/2019   Lab Results  Component Value Date   HGBA1C 8.8 (H) 09/11/2018   Dyslipidemia Lab Results  Component Value Date   LDLCALC 81 09/11/2018  LDL not at goal <70. He is taking atorvastatin 20mg  daily as prescribed. Fastin lipid panel pending. Denies any statin intolerance. Has made changes in his dietary habits such as stopped frying foods.    Review of Systems  Constitutional: Negative for fever, malaise/fatigue and weight loss.  HENT: Negative.  Negative for nosebleeds.   Eyes: Negative.  Negative for blurred vision, double vision and photophobia.  Respiratory: Negative.  Negative for cough and shortness of breath.   Cardiovascular: Negative.  Negative for chest pain, palpitations and leg swelling.  Gastrointestinal:  Negative.  Negative for heartburn, nausea and vomiting.  Musculoskeletal: Negative.  Negative for myalgias.  Neurological: Negative.  Negative for dizziness, focal weakness, seizures and headaches.  Psychiatric/Behavioral: Negative.  Negative for suicidal ideas.    Past Medical History:  Diagnosis Date  . Carpal tunnel syndrome   . Diabetes mellitus   .  Hypertension   . Obesity   . Pulmonary embolism ([place]) 2013   He has a history of HTN.  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 A repeat scan done in 2017 did not demonstrate evidence of emboli. Neither study demonstrated any coronary calcifications.   . Shoulder pain, bilateral   . Sleep apnea    was fitted for mask and never got machine - too costly    Past Surgical History:  Procedure Laterality Date  . CARPAL TUNNEL RELEASE Right 12/04/2015   Procedure: RIGHT CARPAL TUNNEL RELEASE;  Surgeon: Scott Cover, MD;  Location: [place];  Service: Orthopedics;  Laterality: Right;  RIGHT CARPAL TUNNEL RELEASE  . CARPAL TUNNEL RELEASE Right 12/2015   [facility]  . CARPAL TUNNEL RELEASE Left 04/01/2016   Procedure: LEFT CARPAL TUNNEL RELEASE;  Surgeon: Scott Cover, MD;  Location: [place];  Service: Orthopedics;  Laterality: Left;  . right hand surgery    . ROTATOR CUFF REPAIR      Family History  Problem Relation Age of Onset  . Hypertension Mother   . Kidney disease Father   . Hypertension Other   . Hyperlipidemia Other   . Diabetes Other     Social History Reviewed with no changes to be made today.   Outpatient Medications Prior to Visit  Medication Sig Dispense Refill  . amLODipine (NORVASC) 10 MG tablet Take 1 tablet (10 mg total) by mouth daily. 90 tablet 3  . ASPIRIN LOW DOSE 81 MG EC tablet TAKE ONE TABLET BY MOUTH DAILY 30 tablet 0  . atorvastatin (LIPITOR) 20 MG tablet Take 1 tablet (20 mg total) by mouth daily. 90 tablet 1  . fluticasone (FLONASE) 50 MCG/ACT nasal spray Place 2 sprays into both nostrils daily. 16 g 6  . glucose blood test strip Use as instructed. Inject into the skin once daily. 100 each 12  . ibuprofen (ADVIL) 600 MG tablet Take 1 tablet (600 mg total) by mouth every 8 (eight) hours as needed (Take with food.). 30 tablet 1  . INVOKANA 300 MG TABS tablet Take 1 tablet (300 mg total) by mouth daily. 90  tablet 2  . Lancet Devices (ONE TOUCH DELICA LANCING DEV) MISC Use as instructed. Inject into the skin once daily 1 each prn  . linaGLIPtin-metFORMIN HCl (JENTADUETO) 2.08-998 MG TABS Take 1 tablet by mouth 2 (two) times daily. 180 tablet 3  . lisinopril (ZESTRIL) 40 MG tablet Take 1 tablet (40 mg total) by mouth daily. 90 tablet 3  . loratadine (CLARITIN REDITABS) 10 MG dissolvable tablet Take 1 tablet (10 mg total) by mouth daily. 30 tablet 3  . omeprazole (PRILOSEC) 20 MG capsule Take 1 capsule (20 mg total) by mouth daily as needed (heartburn). 30 capsule 3  . ALPRAZolam (XANAX) 1 MG tablet Take 1-2 tablets thirty minutes prior to MRI.  May take one additional tablet before entering scanner, if needed.  MUST HAVE DRIVER. (Patient not taking: Reported on 02/09/2019) 3 tablet 0   No facility-administered medications prior to visit.     No Known Allergies     Objective:  BP 138/89 (BP Location: Right Arm, Patient Position: Sitting, Cuff Size: Large)   Pulse 88   Temp 98.6 F (37 C) (Oral)   Ht 5\' 6"  (1.676 m)   Wt 237 lb (107.5 kg)   SpO2 97%   BMI 38.25 kg/m  Wt Readings from Last 3 Encounters:  02/09/19 237 lb (107.5 kg)  01/23/19 232 lb 8 oz (105.5 kg)  06/26/18 232 lb (105.2 kg)    Physical Exam Vitals signs and nursing note reviewed.  Constitutional:      Appearance: He is well-developed.  HENT:     Head: Normocephalic and atraumatic.  Neck:     Musculoskeletal: Normal range of motion.  Cardiovascular:     Rate and Rhythm: Normal rate and regular rhythm.     Heart sounds: Normal heart sounds. No murmur. No friction rub. No gallop.   Pulmonary:     Effort: Pulmonary effort is normal. No tachypnea or respiratory distress.     Breath sounds: Normal breath sounds. No decreased breath sounds, wheezing, rhonchi or rales.  Chest:     Chest wall: No tenderness.  Abdominal:     General: Bowel sounds are normal.     Palpations: Abdomen is soft.  Musculoskeletal: Normal  range of motion.  Skin:    General: Skin is warm and dry.  Neurological:     Mental Status: He is alert and oriented to person, place, and time.     Coordination: Coordination normal.  Psychiatric:        Behavior: Behavior normal. Behavior is cooperative.        Thought Content: Thought content normal.        Judgment: Judgment normal.          Patient has been counseled extensively about nutrition and exercise as well as the importance of adherence with medications and regular follow-up. The patient was given clear instructions to go to ER or return to medical center if symptoms don't improve, worsen or new problems develop. The patient verbalized understanding.   Follow-up: Return in about 4 weeks (around 03/09/2019) for METER CHECK, increased jentadueto to BID then 3 months with me.   Claiborne RiggZelda W Anzlee Hinesley, FNP-BC [facility] [place], [place] [phone]   02/10/2019, 11:43 PM

## 2019-02-09 NOTE — Patient Instructions (Signed)

## 2019-02-10 ENCOUNTER — Encounter: Payer: Self-pay | Admitting: Nurse Practitioner

## 2019-02-13 ENCOUNTER — Ambulatory Visit (INDEPENDENT_AMBULATORY_CARE_PROVIDER_SITE_OTHER): Payer: 59 | Admitting: Neurology

## 2019-02-13 ENCOUNTER — Other Ambulatory Visit: Payer: Self-pay

## 2019-02-13 ENCOUNTER — Encounter: Payer: Self-pay | Admitting: Neurology

## 2019-02-13 VITALS — BP 134/88 | HR 87 | Temp 96.3°F | Ht 66.0 in | Wt 235.0 lb

## 2019-02-13 DIAGNOSIS — G4730 Sleep apnea, unspecified: Secondary | ICD-10-CM

## 2019-02-13 DIAGNOSIS — G4733 Obstructive sleep apnea (adult) (pediatric): Secondary | ICD-10-CM

## 2019-02-13 DIAGNOSIS — G43709 Chronic migraine without aura, not intractable, without status migrainosus: Secondary | ICD-10-CM | POA: Diagnosis not present

## 2019-02-13 DIAGNOSIS — IMO0002 Reserved for concepts with insufficient information to code with codable children: Secondary | ICD-10-CM

## 2019-02-13 DIAGNOSIS — Z6837 Body mass index (BMI) 37.0-37.9, adult: Secondary | ICD-10-CM

## 2019-02-13 NOTE — Progress Notes (Signed)
SLEEP MEDICINE CLINIC    Provider:  Melvyn Novas, MD  Primary Care Physician:  Claiborne Rigg, NP [address] [place] [address]     Referring Provider: Claiborne Rigg, Np [address] [place],  [place] [address]          Chief Complaint according to patient   Patient presents with:    . New Patient (Initial Visit)           HISTORY OF PRESENT ILLNESS:  Scott [name] is a 48 y.o. year old Black or [place] American male patient seen here upon referral from Dr. Terrace Arabia, on  02/13/2019 .  Chief concern according to patient : I had a previous test  That was positive for mild sleep apnea but I couldn t  afford the CPAP at the time.  I have the pleasure of seeing Scott [name] today, a right-handed [place] or [place] American male with a possible sleep disorder.  She has a  has a past medical history of Carpal tunnel syndrome, Diabetes mellitus, Hypertension, Obesity, Pulmonary embolism (HCC) (2013), Shoulder pain, bilateral, and Sleep apnea.Scott [name]  HISTORICAL  Scott [name] is a 48 year old male, seen in request by her primary care nurse practitioner Bertram Denver for evaluation of headaches, initial evaluation was on January 23, 2019.  I have reviewed and summarized the referring note from the referring physician.  He had a past medical history of hypertension, diabetes, hyperlipidemia, obesity, previously was diagnosed with mild obstructive sleep apnea  He had a history of migraine headaches, multiple migraine are holoacranial pressure headache with light noise sensitivity, nauseous, usually respond somewhat to over-the-counter medications.  Since July 2020, he described different kind of headaches, it is constant low-grade on the left side, he also described stuffed nose, postnasal drainage, he stopped his blood pressure medicine amlodipine 2 weeks ago, his symptoms seems to improve a little bit.  He denies light noise sensitivity, or nauseous with his  left-sided headaches.  He has been taking frequent Sudafed every other day over the past 2 months, which only provide temporary relief of his symptoms  About 5 years ago, he had not snoring, daytime sleepiness, fatigue, had outside sleep study was diagnosed with obstructive sleep apnea, because of the cost of the CPAP machine, he did not pursue further.  Now he complains of loud snoring, difficulty sleeping, frequent awakening at nighttime,   The patient had the first sleep study in January 2014 - results were not accessible in EPIC.   Sleep relevant medical history: Nocturia; 2 -3 Sleep walking;no - no deviated septum , no UPPP?    Family medical /sleep history: Mother is on CPAP with OSA.    Social history:  Patient is working as Veterinary surgeon and lives in a household with 4 persons/ alone. Family status is married  with 2 children.  Pets are not present. Tobacco use: no.  ETOH use ; none in 20 years , Caffeine intake in form of Coffee( no) Soda(  1 or 2/days ) Tea ( 3/ week) or energy drinks. Regular exercise in form of walking.     Sleep habits are as follows: The patient's dinner time is between 8 PM. The patient goes to bed at 1.30 AM  and goes to sleep promptly- he will continues to sleep for several  hours, wakes for bathroom breaks.The preferred sleep position is prone , with the support of 2 pillows.  Dreams are reportedly frequent/vivid.Scott [name]  8 AM  is the usual rise time. The patient wakes up spontaneously at 9.30 AM  He reports not feeling refreshed or restored in AM, with symptoms such as morning headaches, and residual fatigue. Naps are taken frequently, lasting  2 hours and are more refreshing than nocturnal sleep.    Review of Systems: Out of a complete 14 system review, the patient complains of only the following symptoms, and all other reviewed systems are negative.:  Fatigue, sleepiness , snoring, fragmented sleep, headaches after sleep.    How likely are you to doze in the following  situations: 0 = not likely, 1 = slight chance, 2 = moderate chance, 3 = high chance   Sitting and Reading? Watching Television? Sitting inactive in a public place (theater or meeting)? As a passenger in a car for an hour without a break? Lying down in the afternoon when circumstances permit? Sitting and talking to someone? Sitting quietly after lunch without alcohol? In a car, while stopped for a few minutes in traffic?   Total = 2/ 24 points   FSS endorsed at 30/ 63 points.   Social History   Socioeconomic History  . Marital status: Married    Spouse name: Not on file  . Number of children: Not on file  . Years of education: 12+  . Highest education level: Not on file  Occupational History  . Occupation: real Insurance account managerestate agent  Social Needs  . Financial resource strain: Not on file  . Food insecurity    Worry: Not on file    Inability: Not on file  . Transportation needs    Medical: Not on file    Non-medical: Not on file  Tobacco Use  . Smoking status: Never Smoker  . Smokeless tobacco: Never Used  Substance and Sexual Activity  . Alcohol use: No    Alcohol/week: 0.0 standard drinks  . Drug use: No  . Sexual activity: Yes  Lifestyle  . Physical activity    Days per week: Not on file    Minutes per session: Not on file  . Stress: Not on file  Relationships  . Social Musicianconnections    Talks on phone: Not on file    Gets together: Not on file    Attends religious service: Not on file    Active member of club or organization: Not on file    Attends meetings of clubs or organizations: Not on file    Relationship status: Not on file  Other Topics Concern  . Not on file  Social History Narrative   Lives at home.   Right-handed.   Occasional caffeine use.   Tea every other day    Family History  Problem Relation Age of Onset  . Hypertension Mother   . Kidney disease Father   . Hypertension Other   . Hyperlipidemia Other   . Diabetes Other     Past Medical  History:  Diagnosis Date  . Carpal tunnel syndrome   . Diabetes mellitus   . Hypertension   . Obesity   . Pulmonary embolism (HCC) 2013   He has a history of HTN.  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 A repeat scan done in 2017 did not demonstrate evidence of emboli. Neither study demonstrated any coronary calcifications.   . Shoulder pain, bilateral   . Sleep apnea    was fitted for mask and never got machine - too costly    Past Surgical History:  Procedure Laterality  Date  . CARPAL TUNNEL RELEASE Right 12/04/2015   Procedure: RIGHT CARPAL TUNNEL RELEASE;  Surgeon: Betha Loa, MD;  Location: [facility];  Service: Orthopedics;  Laterality: Right;  RIGHT CARPAL TUNNEL RELEASE  . CARPAL TUNNEL RELEASE Right 12/2015   [facility]  . CARPAL TUNNEL RELEASE Left 04/01/2016   Procedure: LEFT CARPAL TUNNEL RELEASE;  Surgeon: Betha Loa, MD;  Location: [facility];  Service: Orthopedics;  Laterality: Left;  . right hand surgery    . ROTATOR CUFF REPAIR       Current Outpatient Medications on File Prior to Visit  Medication Sig Dispense Refill  . ALPRAZolam (XANAX) 1 MG tablet Take 1-2 tablets thirty minutes prior to MRI.  May take one additional tablet before entering scanner, if needed.  MUST HAVE DRIVER. 3 tablet 0  . amLODipine (NORVASC) 10 MG tablet Take 1 tablet (10 mg total) by mouth daily. 90 tablet 3  . ASPIRIN LOW DOSE 81 MG EC tablet TAKE ONE TABLET BY MOUTH DAILY 30 tablet 0  . atorvastatin (LIPITOR) 20 MG tablet Take 1 tablet (20 mg total) by mouth daily. 90 tablet 1  . fluticasone (FLONASE) 50 MCG/ACT nasal spray Place 2 sprays into both nostrils daily. 16 g 6  . glucose blood test strip Use as instructed. Inject into the skin once daily. 100 each 12  . ibuprofen (ADVIL) 600 MG tablet Take 1 tablet (600 mg total) by mouth every 8 (eight) hours as needed (Take with food.). 30 tablet 1  . INVOKANA 300 MG TABS tablet Take 1  tablet (300 mg total) by mouth daily. 90 tablet 2  . Lancet Devices (ONE TOUCH DELICA LANCING DEV) MISC Use as instructed. Inject into the skin once daily 1 each prn  . linaGLIPtin-metFORMIN HCl (JENTADUETO) 2.08-998 MG TABS Take 1 tablet by mouth 2 (two) times daily. 180 tablet 3  . lisinopril (ZESTRIL) 40 MG tablet Take 1 tablet (40 mg total) by mouth daily. 90 tablet 3  . loratadine (CLARITIN REDITABS) 10 MG dissolvable tablet Take 1 tablet (10 mg total) by mouth daily. 30 tablet 3  . omeprazole (PRILOSEC) 20 MG capsule Take 1 capsule (20 mg total) by mouth daily as needed (heartburn). 30 capsule 3   No current facility-administered medications on file prior to visit.     No Known Allergies  Physical exam:  Today's Vitals   02/13/19 1307  BP: 134/88  Pulse: 87  Temp: (!) 96.3 F (35.7 C)  Weight: 235 lb (106.6 kg)  Height:  (1.676 m)   Body mass index is 37.93 kg/m.   Wt Readings from Last 3 Encounters:  02/13/19 235 lb (106.6 kg)  02/09/19 237 lb (107.5 kg)  01/23/19 232 lb 8 oz (105.5 kg)     Ht Readings from Last 3 Encounters:  02/13/19  (1.676 m)  02/09/19  (1.676 m)  01/23/19  (1.676 m)      General: The patient is awake, alert and appears not in acute distress. The patient is well groomed. Head: Normocephalic, atraumatic. Neck is supple. Mallampati 2,  neck circumference: 18 inches . Nasal airflow patent.   Retrognathia is not  seen.  Dental status:  Cardiovascular:  Regular rate and cardiac rhythm by pulse,  without distended neck veins. Respiratory: Lungs are clear to auscultation.  Skin:  Without evidence of ankle edema, or rash. Trunk: The patient's posture is erect.   Neurologic exam : The patient is awake and alert, oriented  to place and time.   Memory subjective described as intact.  Attention span & concentration ability appears normal.  Speech is fluent,  without  dysarthria, dysphonia or aphasia.  Mood and affect are  appropriate.   Cranial nerves: no loss of smell or taste reported. Pupils are equal and briskly reactive to light. Funduscopic exam deferred.  Extraocular movements in vertical and horizontal planes were intact and without nystagmus. No Diplopia. Visual fields by finger perimetry are intact. Hearing was intact to soft voice and finger rubbing.    Facial sensation intact to fine touch.  Facial motor strength is symmetric and tongue and uvula move midline.  Neck ROM : rotation, tilt and flexion extension were normal for age and shoulder shrug was symmetrical.    Motor exam:  Symmetric bulk, tone and ROM.   Normal tone without cog- wheeling, symmetric grip strength.   Sensory:  Fine touch and vibration were normal.    Coordination: Rapid alternating movements in the fingers/hands were of normal speed.  The Finger-to-nose maneuver was intact without evidence of ataxia, dysmetria or tremor.   Gait and station: Patient could rise unassisted from a seated position, walked without assistive device.  Stance is of normal width/ base and the patient turned with 3 steps.  Toe and heel walk were deferred.  Deep tendon reflexes: in the  upper and lower extremities are symmetric and intact.  Babinski response was deferred.      After spending a total time of  35  minutes face to face and additional time for physical and neurologic examination, review of laboratory studies,  personal review of imaging studies, reports and results of other testing and review of referral information / records as far as provided in visit, I have established the following assessments:  1)  High risk for OSA by BMI and neck size, but airway is not narrow.  Repeat sleep study can be a HST- screening for apnea.      My Plan is to proceed with:  HST, weight loss implementing sleep routines. I gave the 14 day boot camp.     I would like to thank [name], NP and [name], [place],  [place] [address] for allowing me to meet with and to take care of this pleasant patient.   In short, Scott [name] is presenting with a diagnosis of OSA , untreated.  I plan to follow up either personally or through our NP within 2-3 month.   CC: I will share my notes with PCP and dr Krista Blue.   Electronically signed by: Larey Seat, MD 02/13/2019 1:12 PM  [facility] Board certified by The [employer] of Sleep Medicine and Diplomate of the [employer] of Sleep Medicine. Board certified In Neurology through the [place], Fellow of the [employer] of Neurology. Medical Director of [facility].

## 2019-02-14 ENCOUNTER — Other Ambulatory Visit: Payer: Self-pay

## 2019-02-14 ENCOUNTER — Ambulatory Visit: Payer: 59

## 2019-02-14 DIAGNOSIS — R519 Headache, unspecified: Secondary | ICD-10-CM | POA: Diagnosis not present

## 2019-02-20 ENCOUNTER — Telehealth: Payer: Self-pay

## 2019-02-20 NOTE — Telephone Encounter (Signed)
I returned the call to the patient and he is aware his MRI brain was normal.  He confirmed he received Dr. Rhea Belton email with this information.

## 2019-02-20 NOTE — Telephone Encounter (Signed)
Patient called and requested a call to go over his MRI results. He noticed they were posted in [place] but he did not understand all of the comments and would like to discuss with the nurse. Please call and advise.

## 2019-03-06 ENCOUNTER — Telehealth: Payer: Self-pay

## 2019-03-06 NOTE — Telephone Encounter (Signed)
JARDIANCE IS PREFERRED BY PT'S INS AS OPPOSED TO INVOKANA-IF APPROPRIATE, WOULD YOU PLEASE CONSIDER THERAPY CHANGE?

## 2019-03-09 ENCOUNTER — Ambulatory Visit: Payer: 59 | Admitting: Pharmacist

## 2019-03-09 ENCOUNTER — Other Ambulatory Visit: Payer: Self-pay | Admitting: Nurse Practitioner

## 2019-03-09 DIAGNOSIS — E782 Mixed hyperlipidemia: Secondary | ICD-10-CM

## 2019-03-09 MED ORDER — JARDIANCE 25 MG PO TABS: 25.0000 mg | ORAL_TABLET | Freq: Every day | ORAL | 1 refills | Status: AC

## 2019-03-09 NOTE — Telephone Encounter (Signed)
No, where you sent it is fine.  I was just working on the PA for that pharmacy, thanks!

## 2019-03-09 NOTE — Telephone Encounter (Signed)
Reggie Pile,  I sent this to another pharmacy. Does it need to go through [facility]

## 2019-03-22 ENCOUNTER — Encounter: Payer: Self-pay | Admitting: Registered"

## 2019-03-22 ENCOUNTER — Other Ambulatory Visit: Payer: Self-pay

## 2019-03-22 ENCOUNTER — Encounter: Payer: 59 | Attending: Nurse Practitioner | Admitting: Registered"

## 2019-03-22 DIAGNOSIS — E1165 Type 2 diabetes mellitus with hyperglycemia: Secondary | ICD-10-CM

## 2019-03-22 NOTE — Patient Instructions (Addendum)
Shoot for being in bed by 12 am. Turn off computer by 11 pm and winding down for sleep. Consider trying out sleep tips #2 & #6 Exercise 3 x per week in the evening starting with 10 min. Being mindful of your nighttime snack If you start having cramps consider looking into other foods that are high in potassium and magnesium.

## 2019-03-22 NOTE — Progress Notes (Signed)
Diabetes Self-Management Education  Visit Type: First/Initial   Appt. Start Time: 1120 Appt. End Time: 1245  03/22/2019  [name], identified by name and date of birth, is a 48 y.o. male with a diagnosis of Diabetes: Type 2.   ASSESSMENT  There were no vitals taken for this visit. There is no height or weight on file to calculate BMI.   Pt states he is getting close to 48 yrs old now and knows he needs to start taking better care of his health, eating better and getting more activity. Pt reports ultimately he would like to be off medications.   Medication: Pt reports his insurance stopped covering Invokana and he has not filled the replacement medication, Jardiance*, yet because pharmacist told him that he didn't need both Jardiance and Jentadeuto; Patient not taking amlodipine but states he is taking lisinopril. Pt states he would like to learn about how the medications work at his follow-up appointment.   Sleep: goes to bed between 12:30-  2 am; gets up several times to urinate when taking SGLT2-i.  Wake up time: If no appointments 8:30, if he doesn't have obligations may sleep in until 10 am. Pt states he has a mild case of sleep apnea and needs to pick home test machine. Pt states he has looked into sleep aids and melatonin.  Physical activity: Pt states he Has a bike at home, he is 49.  SMBG: Pt reports he was told to check BG 3x day; was checking for awhile, but has been 1 week since he last check; FBG 139 mg/dL then. Reports hypoglycemic symptoms ~10 days ago got shaky before he got home to eat dinner. Didn't have meter with him to check BG to see what it was.  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 Pt said MD suggested [place] yogurt and he bought some but to awkward to eat food with a spoon while driving. For lunch his go to is [employer] and has been getting the fried chicken sandwich, but plans to get  grilled more often. Pt states he eats dinner at home, wife prepares.  Next visit RD plans to discuss medications, hypoglycemia and evaluate his progress identifying and balancing carbs in his diet.  Empaglaflozin [employer]) has been shown to improve Left ventricular diastolic dysfunction which is included in patient's problem list. Patient states that this problem was investigated and states he was told that it is not a problem.  Diabetes Self-Management Education - 03/22/19 1133      Visit Information   Visit Type  First/Initial      Initial Visit   Diabetes Type  Type 2    Are you currently following a meal plan?  Yes    What type of meal plan do you follow?  trying to stay off bread    Are you taking your medications as prescribed?  No   only [email or web address]   Date Diagnosed  1999      Health Coping   How would you rate your overall health?  Other (comment)   it's all right, could be better     Psychosocial Assessment   Patient Belief/Attitude about Diabetes  Motivated to manage diabetes    How often do you need to have someone help you when you read instructions, pamphlets, or other written materials from your doctor or pharmacy?  1 - Never    What is the  last grade level you completed in school?  12      Complications   Last HgB A1C per patient/outside source  8.4 %    How often do you check your blood sugar?  3-4 times / week    Have you had a dilated eye exam in the past 12 months?  Yes    Have you had a dental exam in the past 12 months?  Yes    Are you checking your feet?  Yes    How many days per week are you checking your feet?  7      Dietary Intake   Breakfast  nabs on the go, diet coke, water    Snack (morning)  none    Lunch  sandwich,[employer] fried chicken sandwich, fries    Dinner  8-10 pm; air fryer pork chops, chicken, rice or potatoes, green beans OR spaghetti, meat sauce bread,    Snack (evening)  2 scoops ice cream 30 min after dinner OR fruit OR danish     Beverage(s)  water, diet drink, hot tea      Exercise   Exercise Type  ADL's   busy as a realtor   How many days per week to you exercise?  0    How many minutes per day do you exercise?  0    Total minutes per week of exercise  0      Patient Education   Previous Diabetes Education  Yes (please comment)   a long time ago   Nutrition management   --   pathophysiology of T2DM   Physical activity and exercise   Role of exercise on diabetes management, blood pressure control and cardiac health.    Monitoring  Purpose and frequency of SMBG.      Individualized Goals (developed by patient)   Nutrition  General guidelines for healthy choices and portions discussed    Physical Activity  Exercise 3-5 times per week    Medications  take my medication as prescribed    Reducing Risk  --   better sleep     Outcomes   Expected Outcomes  Demonstrated interest in learning. Expect positive outcomes    Future DMSE  4-6 wks    Program Status  Not Completed       Individualized Plan for Diabetes Self-Management Training:   Learning Objective:  Patient will have a greater understanding of diabetes self-management. Patient education plan is to attend individual and/or group sessions per assessed needs and concerns.    Patient Instructions  Shoot for being in bed by 12 am. Turn off computer by 11 pm and winding down for sleep. Consider trying out sleep tips #2 & #6 Exercise 3 x per week in the evening starting with 10 min. Being mindful of your nighttime snack If you start having cramps consider looking into other foods that are high in potassium and magnesium.   Expected Outcomes:  Demonstrated interest in learning. Expect positive outcomes, sleep hygiene  Education material provided: ADA - How to Thrive: A Guide for Your Journey with Diabetes, My Plate and Carbohydrate counting sheet  If problems or questions, patient to contact team via:  Phone and MyChart  Future DSME appointment:  4-6 wks

## 2019-04-04 ENCOUNTER — Other Ambulatory Visit: Payer: Self-pay | Admitting: Nurse Practitioner

## 2019-04-04 ENCOUNTER — Other Ambulatory Visit: Payer: Self-pay | Admitting: Family Medicine

## 2019-04-04 DIAGNOSIS — E782 Mixed hyperlipidemia: Secondary | ICD-10-CM

## 2019-04-04 DIAGNOSIS — I10 Essential (primary) hypertension: Secondary | ICD-10-CM

## 2019-04-11 ENCOUNTER — Other Ambulatory Visit: Payer: Self-pay

## 2019-04-11 ENCOUNTER — Ambulatory Visit (INDEPENDENT_AMBULATORY_CARE_PROVIDER_SITE_OTHER): Payer: 59 | Admitting: Family Medicine

## 2019-04-11 ENCOUNTER — Encounter: Payer: Self-pay | Admitting: Family Medicine

## 2019-04-11 VITALS — BP 128/88 | Ht 66.0 in | Wt 235.0 lb

## 2019-04-11 DIAGNOSIS — M25579 Pain in unspecified ankle and joints of unspecified foot: Secondary | ICD-10-CM | POA: Diagnosis not present

## 2019-04-11 DIAGNOSIS — M65312 Trigger thumb, left thumb: Secondary | ICD-10-CM | POA: Diagnosis not present

## 2019-04-11 NOTE — Progress Notes (Signed)
[facility] [address] [place], [place] [address] Phone: [phone] Fax: [phone]   Patient Name: Scott [name] Date of Birth: March 29, 1971 Medical Record Number: [id number] Gender: male Date of Encounter: 04/11/2019  CC: Left foot and left thumb pain  HPI: Pt is here c/o left foot and left thumb pain. Pain started roughly 1 month ago. No MOI or inciting event.  Foot  aggravating factors include pushing off toes. Alleviating factors include rest.  Describes having burning and pain on the outside of his foot.  He states the top of his foot will swell up at the end of the day.  No previous history of gout.  No new changes of shoes or terrain.  States he does a lot of walking. Thumb Aggravating factors include extending his thumb.  Alleviated with rest.  He feels like it will intermittently catch.  No previous injury to his thumb.  He is still able to perform normal activity level.   Past Medical History:  Diagnosis Date  . Carpal tunnel syndrome   . Diabetes mellitus   . Hypertension   . Obesity   . Pulmonary embolism ([facility]) 2013   He has a history of HTN.  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 A repeat scan done in 2017 did not demonstrate evidence of emboli. Neither study demonstrated any coronary calcifications.   . Shoulder pain, bilateral   . Sleep apnea    was fitted for mask and never got machine - too costly    Current Outpatient Medications on File Prior to Visit  Medication Sig Dispense Refill  . ASPIRIN LOW DOSE 81 MG EC tablet TAKE ONE TABLET BY MOUTH DAILY 30 tablet 0  . atorvastatin (LIPITOR) 20 MG tablet TAKE ONE TABLET BY MOUTH DAILY 30 tablet 0  . fluticasone (FLONASE) 50 MCG/ACT nasal spray Place 2 sprays into both nostrils daily. 16 g 6  . glucose blood test strip Use as instructed. Inject into the skin once daily. 100 each 12  . ibuprofen (ADVIL) 600 MG tablet Take 1 tablet (600 mg  total) by mouth every 8 (eight) hours as needed (Take with food.). 30 tablet 1  . Lancet Devices (ONE TOUCH DELICA LANCING DEV) MISC Use as instructed. Inject into the skin once daily 1 each prn  . linaGLIPtin-metFORMIN HCl (JENTADUETO) 2.08-998 MG TABS Take 1 tablet by mouth 2 (two) times daily. 180 tablet 3  . lisinopril (ZESTRIL) 40 MG tablet Take 1 tablet (40 mg total) by mouth daily. 90 tablet 3  . loratadine (CLARITIN REDITABS) 10 MG dissolvable tablet Take 1 tablet (10 mg total) by mouth daily. 30 tablet 3  . omeprazole (PRILOSEC) 20 MG capsule Take 1 capsule (20 mg total) by mouth daily as needed (heartburn). 30 capsule 3  . ALPRAZolam (XANAX) 1 MG tablet Take 1-2 tablets thirty minutes prior to MRI.  May take one additional tablet before entering scanner, if needed.  MUST HAVE DRIVER. (Patient not taking: Reported on 03/22/2019) 3 tablet 0  . amLODipine (NORVASC) 10 MG tablet Take 1 tablet (10 mg total) by mouth daily. (Patient not taking: Reported on 03/22/2019) 90 tablet 3  . empagliflozin (JARDIANCE) 25 MG TABS tablet Take 25 mg by mouth daily before breakfast. (Patient not taking: Reported on 03/22/2019) 90 tablet 1   No current facility-administered medications on file prior to visit.     Past Surgical History:  Procedure Laterality Date  . CARPAL TUNNEL RELEASE  Right 12/04/2015   Procedure: RIGHT CARPAL TUNNEL RELEASE;  Surgeon: Betha Loa, MD;  Location: [facility];  Service: Orthopedics;  Laterality: Right;  RIGHT CARPAL TUNNEL RELEASE  . CARPAL TUNNEL RELEASE Right 12/2015   [facility]  . CARPAL TUNNEL RELEASE Left 04/01/2016   Procedure: LEFT CARPAL TUNNEL RELEASE;  Surgeon: Betha Loa, MD;  Location: [facility];  Service: Orthopedics;  Laterality: Left;  . right hand surgery    . ROTATOR CUFF REPAIR      No Known Allergies  Social History   Socioeconomic History  . Marital status: Married    Spouse name: Not on file  . Number of children:  Not on file  . Years of education: 12+  . Highest education level: Not on file  Occupational History  . Occupation: real Insurance account manager  Social Needs  . Financial resource strain: Not on file  . Food insecurity    Worry: Not on file    Inability: Not on file  . Transportation needs    Medical: Not on file    Non-medical: Not on file  Tobacco Use  . Smoking status: Never Smoker  . Smokeless tobacco: Never Used  Substance and Sexual Activity  . Alcohol use: No    Alcohol/week: 0.0 standard drinks  . Drug use: No  . Sexual activity: Yes  Lifestyle  . Physical activity    Days per week: Not on file    Minutes per session: Not on file  . Stress: Not on file  Relationships  . Social Musician on phone: Not on file    Gets together: Not on file    Attends religious service: Not on file    Active member of club or organization: Not on file    Attends meetings of clubs or organizations: Not on file    Relationship status: Not on file  . Intimate partner violence    Fear of current or ex partner: Not on file    Emotionally abused: Not on file    Physically abused: Not on file    Forced sexual activity: Not on file  Other Topics Concern  . Not on file  Social History Narrative   Lives at home.   Right-handed.   Occasional caffeine use.   Tea every other day    Family History  Problem Relation Age of Onset  . Hypertension Mother   . Kidney disease Father   . Hypertension Other   . Hyperlipidemia Other   . Diabetes Other     BP 128/88   Ht 5\' 6"  (1.676 m)   Wt 235 lb (106.6 kg)   BMI 37.93 kg/m   ROS:  See HPI CONST: no F/C, no malaise, no fatigue MSK: See above NEURO: no numbness/tingling, no weakness SKIN: no rash, no lesions HEME: no bleeding, no bruising, no erythema  Objective: GEN: Alert and oriented, NAD Pulm: Breathing unlabored PSY: normal mood, congruent affect  Left foot No swelling or erythema Full ROM at ankle and toes Full  strength at ankle and toes Mild TTP at fourth and fifth TMT joints Negative compression test Normal transverse and medial arch NVI  Left thumb No swelling or erythema Full ROM Full strength Small nodule at base of CMC joint at A1 pulley Mild catch with active thumb extension and opposition NVI  Limited MSK ultrasound Left foot Short and long axis views of all 5 metatarsal and tarsometatarsal joints reveal no abnormality or cortical  irregularity.  There is some mild swelling and hypoechoic changes around the peroneal tendons as they cross posterior to lateral malleolus.  Impression: Mild peroneal tenosynovitis  Left thumb Extensor tendon is intact without any evidence of hypoechoic changes or tear.  There is a small hypoechoic shadow that is best visualized in dynamic testing of the thumb on top of the extensor tendon - thickening of A1 pulley  Impression: Triggering of thumb  Assessment and Plan:  1.  Mild peroneal tenosynovitis  Fitted patient for size 10 green sports insoles with scaphoid pad on the left side to alleviate the pressure.  Discussed importance of maintaining overall strength and flexibility at the ankle.  Given that there is reassuring findings on ultrasound today, we will hold off on any foot injection at this time.  He can also take vitamin B6 daily to see if that benefits his neuropathic pain.  2.  Left trigger thumb  Instructed on Band-Aid splinting at night and during the day as needed.  He will soak daily in warm water.  And follow-up in 6 weeks at which time we can consider an injection.  Judge Stallominic Sharmain Lastra, DO, ATC Sports Medicine Fellow

## 2019-04-11 NOTE — Patient Instructions (Addendum)
You have a trigger thumb. Do the band-aid splinting at nighttime but consider during the day as often as possible also. Soaking in hot water for 15 minutes at a time may loosen this up also. Follow up with me in 6 weeks. Can consider steroid injection into the tendon sheath if not improving as expected.  Home exercises 3 sets of 10 once a day with yellow theraband. I would avoid icing or heat given the nerve irritation you have down here. Vitamin B6 50mg  a day may be helpful. Aleve 2 tabs twice a day with food as needed. Arch supports are important - the sports insoles with scaphoid pads - wear when up and walking around. Avoid flat shoes, barefoot walking.

## 2019-04-18 ENCOUNTER — Encounter: Payer: Self-pay | Admitting: Family Medicine

## 2019-04-18 ENCOUNTER — Ambulatory Visit (INDEPENDENT_AMBULATORY_CARE_PROVIDER_SITE_OTHER): Payer: 59 | Admitting: Family Medicine

## 2019-04-18 ENCOUNTER — Other Ambulatory Visit: Payer: Self-pay

## 2019-04-18 VITALS — BP 148/90 | Ht 66.0 in | Wt 235.0 lb

## 2019-04-18 DIAGNOSIS — M25562 Pain in left knee: Secondary | ICD-10-CM | POA: Diagnosis not present

## 2019-04-18 DIAGNOSIS — M65312 Trigger thumb, left thumb: Secondary | ICD-10-CM

## 2019-04-18 MED ORDER — METHYLPREDNISOLONE ACETATE 40 MG/ML IJ SUSP
20.0000 mg | Freq: Once | INTRAMUSCULAR | Status: AC
  Administered 2019-04-18: 20 mg via INTRA_ARTICULAR

## 2019-04-18 NOTE — Patient Instructions (Signed)
Your knee pain is due to mild arthritis. These are the different medications you can take for this: Tylenol 500mg  1-2 tabs three times a day for pain. Capsaicin, aspercreme, or biofreeze topically up to four times a day may also help with pain. Some supplements that may help for arthritis: Boswellia extract, curcumin, pycnogenol Aleve 1-2 tabs twice a day with food Cortisone injections are an option. If cortisone injections do not help, there are different types of shots that may help but they take longer to take effect. It's important that you continue to stay active. Straight leg raises, knee extensions 3 sets of 10 once a day (add ankle weight if these become too easy). Consider physical therapy to strengthen muscles around the joint that hurts to take pressure off of the joint itself. Shoe inserts with good arch support may be helpful. Heat or ice 15 minutes at a time 3-4 times a day as needed to help with pain. Water aerobics and cycling with low resistance are the best two types of exercise for arthritis though any exercise is ok as long as it doesn't worsen the pain. Follow up with me in 6 weeks or as needed if you're doing well.  You were given an injection for your trigger thumb.

## 2019-04-18 NOTE — Progress Notes (Signed)
[facility] [address] [place], [place] [address] Phone: [phone] Fax: [phone]   Patient Name: Scott [name] Date of Birth: Sep 14, 1970 Medical Record Number: [id number] Gender: male Date of Encounter: 04/18/2019  CC: Follow-up left thumb and new left knee pain  HPI: Left thumb: Patient is still having pain and triggering at the base of his left thumb.  He tried the Band-Aid splint at night, but it kept coming apart.  He purchased an OTC day and nighttime splint that he has been wearing with minimal improvement.  He denies any radiating pain to the tip of his thumb, but will have pain at the IP joint as well.  He is interested in an injection today.  Left knee: Patient complains of left knee pain, medial worse than lateral.  He has been seen for knee pain in the past, including having a CSI in the past.  The pain is worse with deep knee flexion.  Alleviated with rest.  He has not tried any medication or therapy for this.  He denies any instability or swelling.  Is able to perform normal activities of daily living pain-free.    Past Medical History:  Diagnosis Date  . Carpal tunnel syndrome   . Diabetes mellitus   . Hypertension   . Obesity   . Pulmonary embolism ([facility]) 2013   He has a history of HTN.  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 A repeat scan done in 2017 did not demonstrate evidence of emboli. Neither study demonstrated any coronary calcifications.   . Shoulder pain, bilateral   . Sleep apnea    was fitted for mask and never got machine - too costly    Current Outpatient Medications on File Prior to Visit  Medication Sig Dispense Refill  . ALPRAZolam (XANAX) 1 MG tablet Take 1-2 tablets thirty minutes prior to MRI.  May take one additional tablet before entering scanner, if needed.  MUST HAVE DRIVER. (Patient not taking: Reported on 03/22/2019) 3 tablet 0  . amLODipine (NORVASC) 10 MG  tablet Take 1 tablet (10 mg total) by mouth daily. (Patient not taking: Reported on 03/22/2019) 90 tablet 3  . ASPIRIN LOW DOSE 81 MG EC tablet TAKE ONE TABLET BY MOUTH DAILY 30 tablet 0  . atorvastatin (LIPITOR) 20 MG tablet TAKE ONE TABLET BY MOUTH DAILY 30 tablet 0  . empagliflozin (JARDIANCE) 25 MG TABS tablet Take 25 mg by mouth daily before breakfast. (Patient not taking: Reported on 03/22/2019) 90 tablet 1  . fluticasone (FLONASE) 50 MCG/ACT nasal spray Place 2 sprays into both nostrils daily. 16 g 6  . glucose blood test strip Use as instructed. Inject into the skin once daily. 100 each 12  . ibuprofen (ADVIL) 600 MG tablet Take 1 tablet (600 mg total) by mouth every 8 (eight) hours as needed (Take with food.). 30 tablet 1  . Lancet Devices (ONE TOUCH DELICA LANCING DEV) MISC Use as instructed. Inject into the skin once daily 1 each prn  . linaGLIPtin-metFORMIN HCl (JENTADUETO) 2.08-998 MG TABS Take 1 tablet by mouth 2 (two) times daily. 180 tablet 3  . lisinopril (ZESTRIL) 20 MG tablet Take 20 mg by mouth daily.    [name] loratadine (CLARITIN REDITABS) 10 MG dissolvable tablet Take 1 tablet (10 mg total) by mouth daily. 30 tablet 3  . omeprazole (PRILOSEC) 20 MG capsule Take 1 capsule (20 mg total) by mouth daily as needed (heartburn). 30 capsule 3  No current facility-administered medications on file prior to visit.    Past Surgical History:  Procedure Laterality Date  . CARPAL TUNNEL RELEASE Right 12/04/2015   Procedure: RIGHT CARPAL TUNNEL RELEASE;  Surgeon: Leanora Cover, MD;  Location: [facility];  Service: Orthopedics;  Laterality: Right;  RIGHT CARPAL TUNNEL RELEASE  . CARPAL TUNNEL RELEASE Right 12/2015   [facility]  . CARPAL TUNNEL RELEASE Left 04/01/2016   Procedure: LEFT CARPAL TUNNEL RELEASE;  Surgeon: Leanora Cover, MD;  Location: [facility];  Service: Orthopedics;  Laterality: Left;  . right hand surgery    . ROTATOR CUFF REPAIR      No Known  Allergies  Social History   Socioeconomic History  . Marital status: Married    Spouse name: Not on file  . Number of children: Not on file  . Years of education: 12+  . Highest education level: Not on file  Occupational History  . Occupation: real Conservation officer, historic buildings  Tobacco Use  . Smoking status: Never Smoker  . Smokeless tobacco: Never Used  Substance and Sexual Activity  . Alcohol use: No    Alcohol/week: 0.0 standard drinks  . Drug use: No  . Sexual activity: Yes  Other Topics Concern  . Not on file  Social History Narrative   Lives at home.   Right-handed.   Occasional caffeine use.   Tea every other day   Social Determinants of Health   Financial Resource Strain:   . Difficulty of Paying Living Expenses: Not on file  Food Insecurity:   . Worried About Charity fundraiser in the Last Year: Not on file  . Ran Out of Food in the Last Year: Not on file  Transportation Needs:   . Lack of Transportation (Medical): Not on file  . Lack of Transportation (Non-Medical): Not on file  Physical Activity:   . Days of Exercise per Week: Not on file  . Minutes of Exercise per Session: Not on file  Stress:   . Feeling of Stress : Not on file  Social Connections:   . Frequency of Communication with Friends and Family: Not on file  . Frequency of Social Gatherings with Friends and Family: Not on file  . Attends Religious Services: Not on file  . Active Member of Clubs or Organizations: Not on file  . Attends Archivist Meetings: Not on file  . Marital Status: Not on file  Intimate Partner Violence:   . Fear of Current or Ex-Partner: Not on file  . Emotionally Abused: Not on file  . Physically Abused: Not on file  . Sexually Abused: Not on file    Family History  Problem Relation Age of Onset  . Hypertension Mother   . Kidney disease Father   . Hypertension Other   . Hyperlipidemia Other   . Diabetes Other     BP (!) 148/90   Ht 5\' 6"  (1.676 m)   Wt 235 lb  (106.6 kg)   BMI 37.93 kg/m   ROS:  See HPI CONST: no F/C, no malaise, no fatigue MSK: See above NEURO: no numbness/tingling, no weakness SKIN: no rash, no lesions HEME: no bleeding, no bruising, no erythema  Objective: GEN: Alert and oriented, NAD Pulm: Breathing unlabored PSY: normal mood, congruent affect  Left knee: Normal to inspection with no erythema or effusion or obvious bony abnormalities. Medial joint line tenderness to palpation ROM full in flexion and extension and lower leg rotation. Ligaments with solid  consistent endpoints including ACL, PCL, LCL, MCL. Positive Thessaly tests. Non painful patellar compression. Patellar glide with crepitus. Patellar and quadriceps tendons unremarkable. Hamstring and quadriceps strength is normal.  Neurovascularly intact.  Left thumb: Normal to inspection with no erythema or effusion TTP with palpable nodule at base of thumb Full strength and ROM Negative compression test Neurovascularly intact  Procedure Left thumb After informed consent was signed, patient was seated on exam table.  Ultrasound was used to identify flexor pollicis longus in short axis.  Alcohol swab was used to clean the skin.  Utilizing a 0.5-0.5 lidocaine: Betamethasone mixture on a 5/8 inch needle under ultrasound guidance, injected into the tendon sheath at level of left 1st A1 pulley.  Patient tolerated procedure well.  All postinjection questions were answered.  Assessment and Plan:  1.  Left trigger thumb  Successful injection as above.  I am hopeful this will offer him some benefit.  He can continue to use topical anti-inflammatory as needed.  If the brace purchased offers support, I am comfortable with him utilizing this as well.  He will follow-up in 1 month for reassessment.  2.  Left knee pain  Likely an exacerbation of known osteoarthritis.  We will start with conservative measures including home exercise program, p.o. and topical  medications, and relative rest.  He will follow-up in 1 month at which time we can consider a steroid injection versus formal physical therapy.   Judge Stallominic Rhilee Currin, DO, ATC Sports Medicine Fellow

## 2019-05-08 ENCOUNTER — Ambulatory Visit: Payer: 59 | Admitting: Neurology

## 2019-05-09 ENCOUNTER — Other Ambulatory Visit: Payer: Self-pay

## 2019-05-09 ENCOUNTER — Ambulatory Visit
Admission: RE | Admit: 2019-05-09 | Discharge: 2019-05-09 | Disposition: A | Discharge status: Home / Self Care | Payer: 59 | Source: Ambulatory Visit | Attending: Family Medicine | Admitting: Family Medicine

## 2019-05-09 ENCOUNTER — Ambulatory Visit (INDEPENDENT_AMBULATORY_CARE_PROVIDER_SITE_OTHER): Payer: 59 | Admitting: Family Medicine

## 2019-05-09 ENCOUNTER — Ambulatory Visit: Payer: Self-pay

## 2019-05-09 VITALS — BP 132/84 | Ht 66.0 in | Wt 230.0 lb

## 2019-05-09 DIAGNOSIS — G8929 Other chronic pain: Secondary | ICD-10-CM

## 2019-05-09 DIAGNOSIS — M25572 Pain in left ankle and joints of left foot: Secondary | ICD-10-CM | POA: Diagnosis not present

## 2019-05-09 DIAGNOSIS — M25562 Pain in left knee: Secondary | ICD-10-CM | POA: Diagnosis not present

## 2019-05-09 DIAGNOSIS — M722 Plantar fascial fibromatosis: Secondary | ICD-10-CM

## 2019-05-09 MED ORDER — METHYLPREDNISOLONE ACETATE 40 MG/ML IJ SUSP
40.0000 mg | Freq: Once | INTRAMUSCULAR | Status: AC
  Administered 2019-05-09: 40 mg via INTRA_ARTICULAR

## 2019-05-09 NOTE — Patient Instructions (Signed)
Your knee pain is due to mild arthritis. These are the different medications you can take for this: Tylenol 500mg  1-2 tabs three times a day for pain. Capsaicin, aspercreme, or biofreeze topically up to four times a day may also help with pain. Some supplements that may help for arthritis: Boswellia extract, curcumin, pycnogenol Aleve 1-2 tabs twice a day with food Cortisone injections are an option - you were given this today. If cortisone injections do not help, there are different types of shots that may help but they take longer to take effect. It's important that you continue to stay active. Straight leg raises, knee extensions 3 sets of 10 once a day (add ankle weight if these become too easy). Consider physical therapy to strengthen muscles around the joint that hurts to take pressure off of the joint itself. Shoe inserts with good arch support may be helpful. Heat or ice 15 minutes at a time 3-4 times a day as needed to help with pain. Water aerobics and cycling with low resistance are the best two types of exercise for arthritis though any exercise is ok as long as it doesn't worsen the pain. Follow up with me in 6 weeks or as needed if you're doing well.  I'm concerned you have a stress reaction or stress fracture of your foot. We will go ahead with an MRI of your foot to further assess.

## 2019-05-10 ENCOUNTER — Encounter: Payer: Self-pay | Admitting: Family Medicine

## 2019-05-10 NOTE — Progress Notes (Signed)
PCP: [name], NP  Subjective:   HPI: Patient is a 49 y.o. male here for left foot pain.  04/11/19: Pt is here c/o left foot and left thumb pain. Pain started roughly 1 month ago. No MOI or inciting event.  Foot  aggravating factors include pushing off toes. Alleviating factors include rest.  Describes having burning and pain on the outside of his foot.  He states the top of his foot will swell up at the end of the day.  No previous history of gout.  No new changes of shoes or terrain.  States he does a lot of walking.  05/09/19: Patient reports his lateral left foot pain continues despite sports insoles, icing. Pain localized about 5th metatarsal area. Some associated burning in this area without numbness. Worse with ambulation, better with rest. Also with continued pain in his left knee medially. His thumb is better following injection for trigger thumb.  Past Medical History:  Diagnosis Date  . Carpal tunnel syndrome   . Diabetes mellitus   . Hypertension   . Obesity   . Pulmonary embolism ([place]) 2013   He has a history of HTN.  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 A repeat scan done in 2017 did not demonstrate evidence of emboli. Neither study demonstrated any coronary calcifications.   . Shoulder pain, bilateral   . Sleep apnea    was fitted for mask and never got machine - too costly    Current Outpatient Medications on File Prior to Visit  Medication Sig Dispense Refill  . ALPRAZolam (XANAX) 1 MG tablet Take 1-2 tablets thirty minutes prior to MRI.  May take one additional tablet before entering scanner, if needed.  MUST HAVE DRIVER. (Patient not taking: Reported on 03/22/2019) 3 tablet 0  . amLODipine (NORVASC) 10 MG tablet Take 1 tablet (10 mg total) by mouth daily. (Patient not taking: Reported on 03/22/2019) 90 tablet 3  . ASPIRIN LOW DOSE 81 MG EC tablet TAKE ONE TABLET BY MOUTH DAILY 30 tablet 0  . atorvastatin (LIPITOR) 20 MG  tablet TAKE ONE TABLET BY MOUTH DAILY 30 tablet 0  . empagliflozin (JARDIANCE) 25 MG TABS tablet Take 25 mg by mouth daily before breakfast. (Patient not taking: Reported on 03/22/2019) 90 tablet 1  . ergocalciferol (VITAMIN D2) 1.25 MG (50000 UT) capsule Vitamin D2 1,250 mcg (50,000 unit) capsule  Take 1 capsule(s) every week by oral route for 30 days.    . fluticasone (FLONASE) 50 MCG/ACT nasal spray Place 2 sprays into both nostrils daily. 16 g 6  . glucose blood test strip Use as instructed. Inject into the skin once daily. 100 each 12  . ibuprofen (ADVIL) 600 MG tablet Take 1 tablet (600 mg total) by mouth every 8 (eight) hours as needed (Take with food.). 30 tablet 1  . Lancet Devices (ONE TOUCH DELICA LANCING DEV) MISC Use as instructed. Inject into the skin once daily 1 each prn  . linaGLIPtin-metFORMIN HCl (JENTADUETO) 2.08-998 MG TABS Take 1 tablet by mouth 2 (two) times daily. 180 tablet 3  . lisinopril (ZESTRIL) 20 MG tablet Take 20 mg by mouth daily.    [name] loratadine (CLARITIN REDITABS) 10 MG dissolvable tablet Take 1 tablet (10 mg total) by mouth daily. 30 tablet 3  . omeprazole (PRILOSEC) 20 MG capsule Take 1 capsule (20 mg total) by mouth daily as needed (heartburn). 30 capsule 3   No current facility-administered medications on file prior to  visit.    Past Surgical History:  Procedure Laterality Date  . CARPAL TUNNEL RELEASE Right 12/04/2015   Procedure: RIGHT CARPAL TUNNEL RELEASE;  Surgeon: Betha Loa, MD;  Location: [facility];  Service: Orthopedics;  Laterality: Right;  RIGHT CARPAL TUNNEL RELEASE  . CARPAL TUNNEL RELEASE Right 12/2015   [facility]  . CARPAL TUNNEL RELEASE Left 04/01/2016   Procedure: LEFT CARPAL TUNNEL RELEASE;  Surgeon: Betha Loa, MD;  Location: [facility];  Service: Orthopedics;  Laterality: Left;  . right hand surgery    . ROTATOR CUFF REPAIR      No Known Allergies  Social History   Socioeconomic History  . Marital  status: Married    Spouse name: Not on file  . Number of children: Not on file  . Years of education: 12+  . Highest education level: Not on file  Occupational History  . Occupation: real Insurance account manager  Tobacco Use  . Smoking status: Never Smoker  . Smokeless tobacco: Never Used  Substance and Sexual Activity  . Alcohol use: No    Alcohol/week: 0.0 standard drinks  . Drug use: No  . Sexual activity: Yes  Other Topics Concern  . Not on file  Social History Narrative   Lives at home.   Right-handed.   Occasional caffeine use.   Tea every other day   Social Determinants of Health   Financial Resource Strain:   . Difficulty of Paying Living Expenses: Not on file  Food Insecurity:   . Worried About Programme researcher, broadcasting/film/video in the Last Year: Not on file  . Ran Out of Food in the Last Year: Not on file  Transportation Needs:   . Lack of Transportation (Medical): Not on file  . Lack of Transportation (Non-Medical): Not on file  Physical Activity:   . Days of Exercise per Week: Not on file  . Minutes of Exercise per Session: Not on file  Stress:   . Feeling of Stress : Not on file  Social Connections:   . Frequency of Communication with Friends and Family: Not on file  . Frequency of Social Gatherings with Friends and Family: Not on file  . Attends Religious Services: Not on file  . Active Member of Clubs or Organizations: Not on file  . Attends Banker Meetings: Not on file  . Marital Status: Not on file  Intimate Partner Violence:   . Fear of Current or Ex-Partner: Not on file  . Emotionally Abused: Not on file  . Physically Abused: Not on file  . Sexually Abused: Not on file    Family History  Problem Relation Age of Onset  . Hypertension Mother   . Kidney disease Father   . Hypertension Other   . Hyperlipidemia Other   . Diabetes Other     BP 132/84   Ht 5\' 6"  (1.676 m)   Wt 230 lb (104.3 kg)   BMI 37.12 kg/m   Review of Systems: See HPI above.      Objective:  Physical Exam:  Gen: NAD, comfortable in exam room  Left knee: No gross deformity, ecchymoses, swelling. TTP medial joint line.  No other tenderness. FROM. Negative ant/post drawers. Negative valgus/varus testing. Negative lachmans. Negative mcmurrays, apleys, patellar apprehension. NV intact distally.   Left foot/ankle: No gross deformity, swelling, ecchymoses FROM with 5/5 strength and no pain with resisted motions. TTP 5th metatarsal.  No peroneal, malleolar, calcaneal, other tenderness of foot or ankle. Negative ant  drawer and talar tilt.   Negative syndesmotic compression. Negative calcaneal squeeze. Thompsons test negative. NV intact distally.  Assessment & Plan:  1. Left knee pain - 2/2 mild arthritis.  Injection given today.  Tylenol, topical medications, supplements, aleve if needed.  Heat or ice.  Home exercises.  F/u in 6 weeks or prn if doing well.  After informed written consent timeout was performed, patient was seated on exam table. Left knee was prepped with alcohol swab and utilizing anteromedial approach, patient's left knee was injected intraarticularly with 3:1 bupivicaine: depomedrol. Patient tolerated the procedure well without immediate complications.  2. Left foot pain - persists despite over 4 weeks of conservative treatment including OTC meds, home exercise program, and inserts.  Localized to 5th metatarsal.  Worse with weight bearing.  Independently reviewed radiographs and no abnormalities noted.  Advised we go ahead with MRI to assess for stress fracture vs stress reaction.  Treatment, follow up will depend on those results.

## 2019-05-14 ENCOUNTER — Encounter: Payer: Self-pay | Admitting: Nurse Practitioner

## 2019-05-14 ENCOUNTER — Other Ambulatory Visit: Payer: Self-pay

## 2019-05-14 ENCOUNTER — Ambulatory Visit: Payer: 59 | Attending: Nurse Practitioner | Admitting: Nurse Practitioner

## 2019-05-14 ENCOUNTER — Other Ambulatory Visit: Payer: 59

## 2019-05-14 DIAGNOSIS — E1165 Type 2 diabetes mellitus with hyperglycemia: Secondary | ICD-10-CM | POA: Diagnosis not present

## 2019-05-14 DIAGNOSIS — Z130 Encounter for screening for diseases of the blood and blood-forming organs and certain disorders involving the immune mechanism: Secondary | ICD-10-CM

## 2019-05-14 DIAGNOSIS — E782 Mixed hyperlipidemia: Secondary | ICD-10-CM | POA: Diagnosis not present

## 2019-05-14 DIAGNOSIS — I10 Essential (primary) hypertension: Secondary | ICD-10-CM | POA: Diagnosis not present

## 2019-05-14 MED ORDER — ATORVASTATIN CALCIUM 20 MG PO TABS: 20.0000 mg | ORAL_TABLET | Freq: Every day | ORAL | 1 refills | Status: DC

## 2019-05-14 NOTE — Progress Notes (Signed)
Virtual Visit via Telephone Note Due to national recommendations of social distancing due to [place] 19, telehealth visit is felt to be most appropriate for this patient at this time.  I discussed the limitations, risks, security and privacy concerns of performing an evaluation and management service by telephone and the availability of in person appointments. I also discussed with the patient that there may be a patient responsible charge related to this service. The patient expressed understanding and agreed to proceed.    I connected with Scott [name] on 05/14/19  at   9:10 AM EST  EDT by telephone and verified that I am speaking with the correct person using two identifiers.   Consent I discussed the limitations, risks, security and privacy concerns of performing an evaluation and management service by telephone and the availability of in person appointments. I also discussed with the patient that there may be a patient responsible charge related to this service. The patient expressed understanding and agreed to proceed.   Location of Patient: Private Residence   Location of Provider: [place] [facility] [employer] Office    Persons participating in Telemedicine visit: [name] FNP-BC [place]    History of Present Illness: Telemedicine visit for: F/U  has a past medical history of Carpal tunnel syndrome, Diabetes mellitus, Hypertension, Obesity, Pulmonary embolism ([place]) (2013), Shoulder pain, bilateral, and Sleep apnea.    DM TYPE 2 Not well controlled. Blood glucose this morning was 144. Monitoring blood glucose levels once per day due to work schedule. Post prandial Averaging:160-220s.  Endorses medication compliance taking Jardiance 25 mg daily and Jentadueto 2.08-998 mg twice daily.  Has some mild GI upset with the Metformin combo medication however will continue at this time as he is agreeable.  He is taking an ACE and a statin.  Denies any  symptoms of hypo or hyperglycemia.  He is overdue for an eye exam however up-to-date on pneumonia and flu vaccine.  Diabetes education given by registered dietitian on 03/22/2019. Lab Results  Component Value Date   HGBA1C 8.4 (A) 02/09/2019   Dyslipidemia LDL not at goal of less than 70.  He endorses medication compliance taking atorvastatin 20 mg daily.  Denies any statin intolerance or myalgias. Lab Results  Component Value Date   LDLCALC 81 09/11/2018   Essential Hypertension He checked his blood pressure over the phone during his telehealth visit with reading 124/90.  Endorses medication compliance taking amlodipine 10 mg daily and lisinopril 20 mg daily. Denies chest pain, shortness of breath, palpitations, lightheadedness, dizziness, headaches or BLE edema.  Not consistently diet compliant in regards to cardiac diet/DASH diet BP Readings from Last 3 Encounters:  05/09/19 132/84  04/18/19 (!) 148/90  04/11/19 128/88    Past Medical History:  Diagnosis Date  . Carpal tunnel syndrome   . Diabetes mellitus   . Hypertension   . Obesity   . Pulmonary embolism ([place]) 2013   He has a history of HTN.  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 A repeat scan done in 2017 did not demonstrate evidence of emboli. Neither study demonstrated any coronary calcifications.   . Shoulder pain, bilateral   . Sleep apnea    was fitted for mask and never got machine - too costly    Past Surgical History:  Procedure Laterality Date  . CARPAL TUNNEL RELEASE Right 12/04/2015   Procedure: RIGHT CARPAL TUNNEL RELEASE;  Surgeon: Leanora Cover, MD;  Location:  [facility];  Service: Orthopedics;  Laterality: Right;  RIGHT CARPAL TUNNEL RELEASE  . CARPAL TUNNEL RELEASE Right 12/2015   [facility]  . CARPAL TUNNEL RELEASE Left 04/01/2016   Procedure: LEFT CARPAL TUNNEL RELEASE;  Surgeon: Leanora Cover, MD;  Location: [facility];  Service: Orthopedics;   Laterality: Left;  . right hand surgery    . ROTATOR CUFF REPAIR      Family History  Problem Relation Age of Onset  . Hypertension Mother   . Kidney disease Father   . Hypertension Other   . Hyperlipidemia Other   . Diabetes Other     Social History   Socioeconomic History  . Marital status: Married    Spouse name: Not on file  . Number of children: Not on file  . Years of education: 12+  . Highest education level: Not on file  Occupational History  . Occupation: real Conservation officer, historic buildings  Tobacco Use  . Smoking status: Never Smoker  . Smokeless tobacco: Never Used  Substance and Sexual Activity  . Alcohol use: No    Alcohol/week: 0.0 standard drinks  . Drug use: No  . Sexual activity: Yes  Other Topics Concern  . Not on file  Social History Narrative   Lives at home.   Right-handed.   Occasional caffeine use.   Tea every other day   Social Determinants of Health   Financial Resource Strain:   . Difficulty of Paying Living Expenses: Not on file  Food Insecurity:   . Worried About Charity fundraiser in the Last Year: Not on file  . Ran Out of Food in the Last Year: Not on file  Transportation Needs:   . Lack of Transportation (Medical): Not on file  . Lack of Transportation (Non-Medical): Not on file  Physical Activity:   . Days of Exercise per Week: Not on file  . Minutes of Exercise per Session: Not on file  Stress:   . Feeling of Stress : Not on file  Social Connections:   . Frequency of Communication with Friends and Family: Not on file  . Frequency of Social Gatherings with Friends and Family: Not on file  . Attends Religious Services: Not on file  . Active Member of Clubs or Organizations: Not on file  . Attends Archivist Meetings: Not on file  . Marital Status: Not on file     Observations/Objective: Awake, alert and oriented x 3   ROS  Assessment and Plan: Scott [name] was seen today for follow-up.  Diagnoses and all orders for this  visit:  Type 2 diabetes mellitus with hyperglycemia, without long-term current use of insulin (HCC) -     A1c; Future Continue blood sugar control as discussed in office today, low carbohydrate diet, and regular physical exercise as tolerated, 150 minutes per week (30 min each day, 5 days per week, or 50 min 3 days per week). Keep blood sugar logs with fasting goal of 90-130 mg/dl, post prandial (after you eat) less than 180.  For Hypoglycemia: BS <60 and Hyperglycemia BS >400; contact the clinic ASAP. Annual eye exams and foot exams are recommended.   Essential hypertension -     CMP14+EGFR; Future Continue all antihypertensives as prescribed.  Remember to bring in your blood pressure log with you for your follow up appointment.  DASH/Mediterranean Diets are healthier choices for HTN.    Mixed hyperlipidemia -     atorvastatin (LIPITOR) 20 MG tablet; Take 1 tablet (  20 mg total) by mouth daily. -     Lipid Panel; Future INSTRUCTIONS: Work on a low fat, heart healthy diet and participate in regular aerobic exercise program by working out at least 150 minutes per week; 5 days a week-30 minutes per day. Avoid red meat/beef/steak,  fried foods. junk foods, sodas, sugary drinks, unhealthy snacking, alcohol and smoking.  Drink at least 80 oz of water per day and monitor your carbohydrate intake daily.    Screening for deficiency anemia -     CBC; Future     Follow Up Instructions Return in about 3 months (around 08/12/2019).     I discussed the assessment and treatment plan with the patient. The patient was provided an opportunity to ask questions and all were answered. The patient agreed with the plan and demonstrated an understanding of the instructions.   The patient was advised to call back or seek an in-person evaluation if the symptoms worsen or if the condition fails to improve as anticipated.  I provided 18 minutes of non-face-to-face time during this encounter including median  intraservice time, reviewing previous notes, labs, imaging, medications and explaining diagnosis and management.  [name], FNP-BC

## 2019-05-15 ENCOUNTER — Ambulatory Visit: Payer: 59 | Attending: Nurse Practitioner

## 2019-05-15 ENCOUNTER — Other Ambulatory Visit: Payer: Self-pay

## 2019-05-15 DIAGNOSIS — I10 Essential (primary) hypertension: Secondary | ICD-10-CM

## 2019-05-15 DIAGNOSIS — E782 Mixed hyperlipidemia: Secondary | ICD-10-CM

## 2019-05-15 DIAGNOSIS — E1165 Type 2 diabetes mellitus with hyperglycemia: Secondary | ICD-10-CM

## 2019-05-15 DIAGNOSIS — Z130 Encounter for screening for diseases of the blood and blood-forming organs and certain disorders involving the immune mechanism: Secondary | ICD-10-CM

## 2019-05-16 ENCOUNTER — Other Ambulatory Visit: Payer: Self-pay | Admitting: Nurse Practitioner

## 2019-05-16 ENCOUNTER — Ambulatory Visit: Payer: 59 | Admitting: Registered"

## 2019-05-16 LAB — CMP14+EGFR
ALT: 64 IU/L — ABNORMAL HIGH (ref 0–44)
AST: 28 IU/L (ref 0–40)
Albumin/Globulin Ratio: 1.9 (ref 1.2–2.2)
Albumin: 4.7 g/dL (ref 4.0–5.0)
Alkaline Phosphatase: 56 IU/L (ref 39–117)
BUN/Creatinine Ratio: 17 (ref 9–20)
BUN: 21 mg/dL (ref 6–24)
Bilirubin Total: 0.7 mg/dL (ref 0.0–1.2)
CO2: 23 mmol/L (ref 20–29)
Calcium: 9.8 mg/dL (ref 8.7–10.2)
Chloride: 102 mmol/L (ref 96–106)
Creatinine, Ser: 1.26 mg/dL (ref 0.76–1.27)
GFR calc Af Amer: 77 mL/min/{1.73_m2} (ref >59)
GFR calc non Af Amer: 67 mL/min/{1.73_m2} (ref >59)
Globulin, Total: 2.5 g/dL (ref 1.5–4.5)
Glucose: 175 mg/dL — ABNORMAL HIGH (ref 65–99)
Potassium: 4.8 mmol/L (ref 3.5–5.2)
Sodium: 141 mmol/L (ref 134–144)
Total Protein: 7.2 g/dL (ref 6.0–8.5)

## 2019-05-16 LAB — HEMOGLOBIN A1C
Est. average glucose Bld gHb Est-mCnc: 209 mg/dL
Hgb A1c MFr Bld: 8.9 % — ABNORMAL HIGH (ref 4.8–5.6)

## 2019-05-16 LAB — LIPID PANEL
Chol/HDL Ratio: 3.1 ratio (ref 0.0–5.0)
Cholesterol, Total: 113 mg/dL (ref 100–199)
HDL: 37 mg/dL — ABNORMAL LOW (ref >39)
LDL Chol Calc (NIH): 61 mg/dL (ref 0–99)
Triglycerides: 69 mg/dL (ref 0–149)
VLDL Cholesterol Cal: 15 mg/dL (ref 5–40)

## 2019-05-16 LAB — CBC
Hematocrit: 48.7 % (ref 37.5–51.0)
Hemoglobin: 16.4 g/dL (ref 13.0–17.7)
MCH: 25.3 pg — ABNORMAL LOW (ref 26.6–33.0)
MCHC: 33.7 g/dL (ref 31.5–35.7)
MCV: 75 fL — ABNORMAL LOW (ref 79–97)
Platelets: 264 10*3/uL (ref 150–450)
RBC: 6.47 x10E6/uL — ABNORMAL HIGH (ref 4.14–5.80)
RDW: 14.6 % (ref 11.6–15.4)
WBC: 6.4 10*3/uL (ref 3.4–10.8)

## 2019-05-16 MED ORDER — BD PEN NEEDLE MINI U/F 31G X 5 MM MISC: 6 refills | Status: DC

## 2019-05-16 MED ORDER — INSULIN GLARGINE 100 UNIT/ML SOLOSTAR PEN: 10.0000 [IU] | PEN_INJECTOR | Freq: Every day | SUBCUTANEOUS | 11 refills | Status: DC

## 2019-05-19 ENCOUNTER — Ambulatory Visit
Admission: RE | Admit: 2019-05-19 | Discharge: 2019-05-19 | Disposition: A | Discharge status: Home / Self Care | Payer: 59 | Source: Ambulatory Visit | Attending: Family Medicine | Admitting: Family Medicine

## 2019-05-19 ENCOUNTER — Other Ambulatory Visit: Payer: Self-pay

## 2019-05-19 DIAGNOSIS — M25572 Pain in left ankle and joints of left foot: Secondary | ICD-10-CM

## 2019-05-23 ENCOUNTER — Telehealth (INDEPENDENT_AMBULATORY_CARE_PROVIDER_SITE_OTHER): Payer: 59 | Admitting: Family Medicine

## 2019-05-23 VITALS — Ht 66.0 in | Wt 235.0 lb

## 2019-05-23 DIAGNOSIS — M25572 Pain in left ankle and joints of left foot: Secondary | ICD-10-CM

## 2019-05-23 NOTE — Progress Notes (Signed)
MRI reviewed and discussed with patient via virtual visit.  He does have some edema in mid-5th metatarsal but without stress fracture.  He's feeling better but still with pain.  Will place in cam walker for 4 weeks and follow-up at that time.

## 2019-06-20 ENCOUNTER — Ambulatory Visit (INDEPENDENT_AMBULATORY_CARE_PROVIDER_SITE_OTHER): Payer: 59 | Admitting: Family Medicine

## 2019-06-20 ENCOUNTER — Other Ambulatory Visit: Payer: Self-pay

## 2019-06-20 ENCOUNTER — Encounter: Payer: Self-pay | Admitting: Family Medicine

## 2019-06-20 VITALS — BP 126/80 | Ht 66.0 in | Wt 235.0 lb

## 2019-06-20 DIAGNOSIS — R208 Other disturbances of skin sensation: Secondary | ICD-10-CM

## 2019-06-20 DIAGNOSIS — R200 Anesthesia of skin: Secondary | ICD-10-CM

## 2019-06-20 NOTE — Patient Instructions (Addendum)
Wear the boot for 2 more weeks then come out of this into a supportive shoe when up and walking around. Be patient with the burning you have - neuropathic pain can take weeks to months to resolve even after your stress reaction has healed. Vitamin b6 50mg  a day may be beneficial for this. Follow up with me in about 1 month for reevaluation.

## 2019-06-20 NOTE — Progress Notes (Signed)
[facility] [address] [place], [place] [address] Phone: [phone] Fax: [phone]   Patient Name: Scott [name] Date of Birth: 08/16/1970 Medical Record Number: [id number] Gender: male Date of Encounter: 06/20/2019  CC: Follow-up left foot burning  HPI: Scott [name] is following up for the stress reaction in his left fifth metatarsal.  He has been wearing a short walking boot for 4 weeks.  His main complaint today is burning on the plantar side of his foot.  The burning started before he was wearing the boot.  Does not radiate proximally towards his knee or distally into his toe.  He has not had this happen before.  Does have a history of bilateral carpal tunnel release.  He denies any pain, swelling, erythema, or skin changes along the plantar foot.   Past Medical History:  Diagnosis Date  . Carpal tunnel syndrome   . Diabetes mellitus   . Hypertension   . Obesity   . Pulmonary embolism (HCC) 2013   He has a history of HTN.  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 A repeat scan done in 2017 did not demonstrate evidence of emboli. Neither study demonstrated any coronary calcifications.   . Shoulder pain, bilateral   . Sleep apnea    was fitted for mask and never got machine - too costly    Current Outpatient Medications on File Prior to Visit  Medication Sig Dispense Refill  . amLODipine (NORVASC) 10 MG tablet Take 1 tablet (10 mg total) by mouth daily. 90 tablet 3  . ASPIRIN LOW DOSE 81 MG EC tablet TAKE ONE TABLET BY MOUTH DAILY 30 tablet 0  . atorvastatin (LIPITOR) 20 MG tablet Take 1 tablet (20 mg total) by mouth daily. 90 tablet 1  . ergocalciferol (VITAMIN D2) 1.25 MG (50000 UT) capsule Vitamin D2 1,250 mcg (50,000 unit) capsule  Take 1 capsule(s) every week by oral route for 30 days.    . fluticasone (FLONASE) 50 MCG/ACT nasal spray Place 2 sprays into both nostrils daily. 16 g 6  . glucose blood test  strip Use as instructed. Inject into the skin once daily. 100 each 12  . ibuprofen (ADVIL) 600 MG tablet Take 1 tablet (600 mg total) by mouth every 8 (eight) hours as needed (Take with food.). 30 tablet 1  . Insulin Glargine (LANTUS) 100 UNIT/ML Solostar Pen Inject 10 Units into the skin daily at 10 pm. 15 mL 11  . Insulin Pen Needle (B-D UF III MINI PEN NEEDLES) 31G X 5 MM MISC Use as instructed. Inject into the skin once nightly. 100 each 6  . Lancet Devices (ONE TOUCH DELICA LANCING DEV) MISC Use as instructed. Inject into the skin once daily 1 each prn  . linaGLIPtin-metFORMIN HCl (JENTADUETO) 2.08-998 MG TABS Take 1 tablet by mouth 2 (two) times daily. 180 tablet 3  . lisinopril (ZESTRIL) 20 MG tablet Take 20 mg by mouth daily.    [name] loratadine (CLARITIN REDITABS) 10 MG dissolvable tablet Take 1 tablet (10 mg total) by mouth daily. 30 tablet 3  . omeprazole (PRILOSEC) 20 MG capsule Take 1 capsule (20 mg total) by mouth daily as needed (heartburn). 30 capsule 3   No current facility-administered medications on file prior to visit.    Past Surgical History:  Procedure Laterality Date  . CARPAL TUNNEL RELEASE Right 12/04/2015   Procedure: RIGHT CARPAL TUNNEL RELEASE;  Surgeon: Betha Loa, MD;  Location: [facility];  Service: Orthopedics;  Laterality: Right;  RIGHT CARPAL TUNNEL RELEASE  . CARPAL TUNNEL RELEASE Right 12/2015   [facility]  . CARPAL TUNNEL RELEASE Left 04/01/2016   Procedure: LEFT CARPAL TUNNEL RELEASE;  Surgeon: Leanora Cover, MD;  Location: [facility];  Service: Orthopedics;  Laterality: Left;  . right hand surgery    . ROTATOR CUFF REPAIR      No Known Allergies  Social History   Socioeconomic History  . Marital status: Married    Spouse name: Not on file  . Number of children: Not on file  . Years of education: 12+  . Highest education level: Not on file  Occupational History  . Occupation: real Conservation officer, historic buildings  Tobacco Use  . Smoking  status: Never Smoker  . Smokeless tobacco: Never Used  Substance and Sexual Activity  . Alcohol use: No    Alcohol/week: 0.0 standard drinks  . Drug use: No  . Sexual activity: Yes  Other Topics Concern  . Not on file  Social History Narrative   Lives at home.   Right-handed.   Occasional caffeine use.   Tea every other day   Social Determinants of Health   Financial Resource Strain:   . Difficulty of Paying Living Expenses: Not on file  Food Insecurity:   . Worried About Charity fundraiser in the Last Year: Not on file  . Ran Out of Food in the Last Year: Not on file  Transportation Needs:   . Lack of Transportation (Medical): Not on file  . Lack of Transportation (Non-Medical): Not on file  Physical Activity:   . Days of Exercise per Week: Not on file  . Minutes of Exercise per Session: Not on file  Stress:   . Feeling of Stress : Not on file  Social Connections:   . Frequency of Communication with Friends and Family: Not on file  . Frequency of Social Gatherings with Friends and Family: Not on file  . Attends Religious Services: Not on file  . Active Member of Clubs or Organizations: Not on file  . Attends Archivist Meetings: Not on file  . Marital Status: Not on file  Intimate Partner Violence:   . Fear of Current or Ex-Partner: Not on file  . Emotionally Abused: Not on file  . Physically Abused: Not on file  . Sexually Abused: Not on file    Family History  Problem Relation Age of Onset  . Hypertension Mother   . Kidney disease Father   . Hypertension Other   . Hyperlipidemia Other   . Diabetes Other     BP 126/80   Ht 5\' 6"  (1.676 m)   Wt 235 lb (106.6 kg)   BMI 37.93 kg/m   ROS:  See HPI CONST: no F/C, no malaise, no fatigue MSK: See above NEURO: no weakness SKIN: no rash, no lesions HEME: no bleeding, no bruising, no erythema  Objective: GEN: Alert and oriented, NAD Pulm: Breathing unlabored PSY: normal mood, congruent  affect  Left foot No swelling or erythema Full ROM at ankle and toes Full strength at ankle and toes No TTP Negative compression test Normal transverse and medial arch Negative Tinel's sign at fibular head NVI   Assessment and Plan:  1.  Left fifth metatarsal stress reaction  Clinically, patient is improving in regards to his stress reaction.  Continue boot for 2 more weeks and then can gradually come out of it into supportive shoe.  Explained that the  burning will improve with time, but could take multiple months.  Can trial vitamin B6.  He will follow-up in 1 month for reevaluation.  Judge Stall, DO, ATC Sports Medicine Fellow

## 2019-07-09 ENCOUNTER — Ambulatory Visit: Payer: 59 | Attending: Internal Medicine

## 2019-07-09 DIAGNOSIS — Z23 Encounter for immunization: Secondary | ICD-10-CM

## 2019-07-09 NOTE — Progress Notes (Signed)
   Covid-19 Vaccination Clinic  Name:  Scott [name]    MRN: [id number] DOB: 01-07-1971  07/09/2019  [name] was observed post Covid-19 immunization for 15 minutes without incident. He was provided with Vaccine Information Sheet and instruction to access the V-Safe system.   [name] was instructed to call 911 with any severe reactions post vaccine: [name] Difficulty breathing  . Swelling of face and throat  . A fast heartbeat  . A bad rash all over body  . Dizziness and weakness   Immunizations Administered    Name Date Dose VIS Date Route   Pfizer COVID-19 Vaccine 07/09/2019  1:22 PM 0.3 mL 04/13/2019 Intramuscular   Manufacturer: [employer], [employer]   Lot: [id number]   NDC: [address]-1

## 2019-07-18 ENCOUNTER — Other Ambulatory Visit: Payer: Self-pay

## 2019-07-18 ENCOUNTER — Ambulatory Visit (INDEPENDENT_AMBULATORY_CARE_PROVIDER_SITE_OTHER): Payer: 59 | Admitting: Family Medicine

## 2019-07-18 ENCOUNTER — Encounter: Payer: Self-pay | Admitting: Family Medicine

## 2019-07-18 VITALS — BP 128/82 | Ht 66.0 in | Wt 235.0 lb

## 2019-07-18 DIAGNOSIS — M79661 Pain in right lower leg: Secondary | ICD-10-CM | POA: Diagnosis not present

## 2019-07-18 DIAGNOSIS — M25572 Pain in left ankle and joints of left foot: Secondary | ICD-10-CM

## 2019-07-18 NOTE — Patient Instructions (Signed)
You have a Grade 1 calf strain Icing for 15 minutes at a time 3-4 times a day as needed Heel lifts either in temporary orthotics or on their own to prevent further strain - use during the day when you're up and walking a lot. Tylenol and/or aleve for pain if needed. Home exercises at least 5 days a week for next 6 weeks Follow up with me in 6 weeks.

## 2019-07-18 NOTE — Progress Notes (Signed)
PCP: [name], NP  Subjective:   HPI: Patient is a 49 y.o. male here for f/u of left 5th metatarsal stress reaction.  Pain is improving following walking boot for 6 weeks (4 weeks full time, 2 weeks at home but supportive shoe at work). Patient still has burning pain on sole of lateral midfoot near 5th metatarsal however focal sharp pain from initial encounter is resolved. Pain is most noticeable at end of workday where he has been on his feet all day. Has been using ibuprofen as needed for pain.   Patient additionally has complaint of occasional right calf pain. He describes it as a similar burning pain to his foot that began when he started wearing the walking boot. It stays locally within his calf and does not radiate distally down the Achilles nor proximally towards the hip.    Past Medical History:  Diagnosis Date  . Carpal tunnel syndrome   . Diabetes mellitus   . Hypertension   . Obesity   . Pulmonary embolism ([place]) 2013   He has a history of HTN.  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 A repeat scan done in 2017 did not demonstrate evidence of emboli. Neither study demonstrated any coronary calcifications.   . Shoulder pain, bilateral   . Sleep apnea    was fitted for mask and never got machine - too costly    Current Outpatient Medications on File Prior to Visit  Medication Sig Dispense Refill  . amLODipine (NORVASC) 10 MG tablet Take 1 tablet (10 mg total) by mouth daily. 90 tablet 3  . ASPIRIN LOW DOSE 81 MG EC tablet TAKE ONE TABLET BY MOUTH DAILY 30 tablet 0  . atorvastatin (LIPITOR) 20 MG tablet Take 1 tablet (20 mg total) by mouth daily. 90 tablet 1  . ergocalciferol (VITAMIN D2) 1.25 MG (50000 UT) capsule Vitamin D2 1,250 mcg (50,000 unit) capsule  Take 1 capsule(s) every week by oral route for 30 days.    . fluticasone (FLONASE) 50 MCG/ACT nasal spray Place 2 sprays into both nostrils daily. 16 g 6  . glucose blood test strip  Use as instructed. Inject into the skin once daily. 100 each 12  . ibuprofen (ADVIL) 600 MG tablet Take 1 tablet (600 mg total) by mouth every 8 (eight) hours as needed (Take with food.). 30 tablet 1  . Insulin Glargine (LANTUS) 100 UNIT/ML Solostar Pen Inject 10 Units into the skin daily at 10 pm. 15 mL 11  . Insulin Pen Needle (B-D UF III MINI PEN NEEDLES) 31G X 5 MM MISC Use as instructed. Inject into the skin once nightly. 100 each 6  . Lancet Devices (ONE TOUCH DELICA LANCING DEV) MISC Use as instructed. Inject into the skin once daily 1 each prn  . linaGLIPtin-metFORMIN HCl (JENTADUETO) 2.08-998 MG TABS Take 1 tablet by mouth 2 (two) times daily. 180 tablet 3  . lisinopril (ZESTRIL) 20 MG tablet Take 20 mg by mouth daily.    [name] loratadine (CLARITIN REDITABS) 10 MG dissolvable tablet Take 1 tablet (10 mg total) by mouth daily. 30 tablet 3  . omeprazole (PRILOSEC) 20 MG capsule Take 1 capsule (20 mg total) by mouth daily as needed (heartburn). 30 capsule 3   No current facility-administered medications on file prior to visit.    Past Surgical History:  Procedure Laterality Date  . CARPAL TUNNEL RELEASE Right 12/04/2015   Procedure: RIGHT CARPAL TUNNEL RELEASE;  Surgeon: Leanora Cover, MD;  Location: [facility];  Service: Orthopedics;  Laterality: Right;  RIGHT CARPAL TUNNEL RELEASE  . CARPAL TUNNEL RELEASE Right 12/2015   [facility]  . CARPAL TUNNEL RELEASE Left 04/01/2016   Procedure: LEFT CARPAL TUNNEL RELEASE;  Surgeon: Betha Loa, MD;  Location: [facility];  Service: Orthopedics;  Laterality: Left;  . right hand surgery    . ROTATOR CUFF REPAIR      No Known Allergies  Social History   Socioeconomic History  . Marital status: Married    Spouse name: Not on file  . Number of children: Not on file  . Years of education: 12+  . Highest education level: Not on file  Occupational History  . Occupation: real Insurance account manager  Tobacco Use  . Smoking status:  Never Smoker  . Smokeless tobacco: Never Used  Substance and Sexual Activity  . Alcohol use: No    Alcohol/week: 0.0 standard drinks  . Drug use: No  . Sexual activity: Yes  Other Topics Concern  . Not on file  Social History Narrative   Lives at home.   Right-handed.   Occasional caffeine use.   Tea every other day   Social Determinants of Health   Financial Resource Strain:   . Difficulty of Paying Living Expenses:   Food Insecurity:   . Worried About Programme researcher, broadcasting/film/video in the Last Year:   . Barista in the Last Year:   Transportation Needs:   . Freight forwarder (Medical):   [name] Lack of Transportation (Non-Medical):   Physical Activity:   . Days of Exercise per Week:   . Minutes of Exercise per Session:   Stress:   . Feeling of Stress :   Social Connections:   . Frequency of Communication with Friends and Family:   . Frequency of Social Gatherings with Friends and Family:   . Attends Religious Services:   . Active Member of Clubs or Organizations:   . Attends Banker Meetings:   [name] Marital Status:   Intimate Partner Violence:   . Fear of Current or Ex-Partner:   . Emotionally Abused:   [name] Physically Abused:   . Sexually Abused:     Family History  Problem Relation Age of Onset  . Hypertension Mother   . Kidney disease Father   . Hypertension Other   . Hyperlipidemia Other   . Diabetes Other     BP 128/82   Ht 5\' 6"  (1.676 m)   Wt 235 lb (106.6 kg)   BMI 37.93 kg/m   Review of Systems: See HPI above.     Objective:  Physical Exam:  General: Alert and oriented x3, NAD, comfortable in exam room HEENT: normocephalic and atraumatic, EOMI, conjunctivae and sclerae clear, MMM, no erythema or exudates, trachea midline,  Left Foot Inspection: no swelling,  no scarring, no overlying bruising or breaks in the skin, no obvious bony abnormalities Palpation: no pain throughout midfoot along 5th metatarsal  ROM (passive): complete ROM  to plantar and dorsiflexion ROM (active): complete ROM to plantar and dorsiflexion Strength: 5/5 in all direction without pain. Special Tests: negative metatarsal squeeze test, negative calcaneal squeeze test, negative talar tilt, negative anterior drawer   Right Calf Inspection: no swelling/edema, appears symmetric to contralateral side, no scarring, no overlying bruising or breaks in the skin, no obvious bony abnormalities Palpation: not TTP throughout the calf muscle, no pain on palpation of Achilles ROM (active): complete ROM to plantarflexion/dorsiflexion and  inversion/eversion Strength: 5/5 to plantar and dorsiflexion Special Tests: negative Thompson test, patient able to perform 5 calf raises without reproduction of pain  Assessment & Plan:   Scott [name] is a 49 y.o. male presenting for 6 week f/u of left 5th metatarsal stress reaction. Pain is improved with no pain on ambulation or exam and resolving burning sensation on lateral sole of the foot that should continue to improve over coming weeks to months. Patient's calf pain appears to be a grade 1 calf strain with negative exam findings for achilles pain or DVT like symptoms.  1. 5th metatarsal stress reaction -- resolving well, continue supportive shoes -- NSAIDS PRN  2. Grade 1 Calf Strain -- home rehab exercises 4-5x per week -- heel lift in shoes if desired -- optional calf sleeve if desired    Hilario Quarry MS4

## 2019-07-26 ENCOUNTER — Ambulatory Visit: Payer: 59 | Admitting: Neurology

## 2019-07-26 NOTE — Progress Notes (Deleted)
[name] DOB: 01/28/1971  REASON FOR VISIT: follow up HISTORY FROM: patient  HISTORY OF PRESENT ILLNESS: Today 07/26/19  HISTORY  Scott [name] is a 49 year old male, seen in request by her primary care nurse practitioner [name] for evaluation of headaches, initial evaluation was on January 23, 2019.  I have reviewed and summarized the referring note from the referring physician.  He had a past medical history of hypertension, diabetes, hyperlipidemia, obesity, previously was diagnosed with mild obstructive sleep apnea  He had a history of migraine headaches, multiple migraine are holoacranial pressure headache with light noise sensitivity, nauseous, usually respond somewhat to over-the-counter medications.  Since July 2020, he described different kind of headaches, it is constant low-grade on the left side, he also described stuffed nose, postnasal drainage, he stopped his blood pressure medicine amlodipine 2 weeks ago, his symptoms seems to improve a little bit.  He denies light noise sensitivity, or nauseous with his left-sided headaches.  He has been taking frequent Sudafed every other day over the past 2 months, which only provide temporary relief of his symptoms  About 5 years ago, he had not snoring, daytime sleepiness, fatigue, had outside sleep study was diagnosed with obstructive sleep apnea, because of the cost of the CPAP machine, he did not pursue further.  Now he complains of loud snoring, difficulty sleeping, frequent awakening at nighttime,  Update July 26, 2019 SS: MRI of the brain was normal  REVIEW OF SYSTEMS: Out of a complete 14 system review of symptoms, the patient complains only of the following symptoms, and all other reviewed systems are negative.  ALLERGIES: No Known Allergies  HOME MEDICATIONS: Outpatient Medications Prior to Visit  Medication Sig Dispense Refill  . amLODipine (NORVASC) 10 MG tablet Take 1 tablet (10  mg total) by mouth daily. 90 tablet 3  . ASPIRIN LOW DOSE 81 MG EC tablet TAKE ONE TABLET BY MOUTH DAILY 30 tablet 0  . atorvastatin (LIPITOR) 20 MG tablet Take 1 tablet (20 mg total) by mouth daily. 90 tablet 1  . ergocalciferol (VITAMIN D2) 1.25 MG (50000 UT) capsule Vitamin D2 1,250 mcg (50,000 unit) capsule  Take 1 capsule(s) every week by oral route for 30 days.    . fluticasone (FLONASE) 50 MCG/ACT nasal spray Place 2 sprays into both nostrils daily. 16 g 6  . glucose blood test strip Use as instructed. Inject into the skin once daily. 100 each 12  . ibuprofen (ADVIL) 600 MG tablet Take 1 tablet (600 mg total) by mouth every 8 (eight) hours as needed (Take with food.). 30 tablet 1  . Insulin Glargine (LANTUS) 100 UNIT/ML Solostar Pen Inject 10 Units into the skin daily at 10 pm. 15 mL 11  . Insulin Pen Needle (B-D UF III MINI PEN NEEDLES) 31G X 5 MM MISC Use as instructed. Inject into the skin once nightly. 100 each 6  . Lancet Devices (ONE TOUCH DELICA LANCING DEV) MISC Use as instructed. Inject into the skin once daily 1 each prn  . linaGLIPtin-metFORMIN HCl (JENTADUETO) 2.08-998 MG TABS Take 1 tablet by mouth 2 (two) times daily. 180 tablet 3  . lisinopril (ZESTRIL) 20 MG tablet Take 20 mg by mouth daily.    [name] loratadine (CLARITIN REDITABS) 10 MG dissolvable tablet Take 1 tablet (10 mg total) by mouth daily. 30 tablet 3  . omeprazole (PRILOSEC) 20 MG capsule Take 1 capsule (20 mg total) by mouth daily as needed (heartburn). 30 capsule 3  No facility-administered medications prior to visit.    PAST MEDICAL HISTORY: Past Medical History:  Diagnosis Date  . Carpal tunnel syndrome   . Diabetes mellitus   . Hypertension   . Obesity   . Pulmonary embolism ([facility]) 2013   He has a history of HTN.  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 A repeat scan done in 2017 did not demonstrate evidence of emboli. Neither study demonstrated any coronary calcifications.    . Shoulder pain, bilateral   . Sleep apnea    was fitted for mask and never got machine - too costly    PAST SURGICAL HISTORY: Past Surgical History:  Procedure Laterality Date  . CARPAL TUNNEL RELEASE Right 12/04/2015   Procedure: RIGHT CARPAL TUNNEL RELEASE;  Surgeon: Betha Loa, MD;  Location: [facility];  Service: Orthopedics;  Laterality: Right;  RIGHT CARPAL TUNNEL RELEASE  . CARPAL TUNNEL RELEASE Right 12/2015   [facility]  . CARPAL TUNNEL RELEASE Left 04/01/2016   Procedure: LEFT CARPAL TUNNEL RELEASE;  Surgeon: Betha Loa, MD;  Location: [facility];  Service: Orthopedics;  Laterality: Left;  . right hand surgery    . ROTATOR CUFF REPAIR      FAMILY HISTORY: Family History  Problem Relation Age of Onset  . Hypertension Mother   . Kidney disease Father   . Hypertension Other   . Hyperlipidemia Other   . Diabetes Other     SOCIAL HISTORY: Social History   Socioeconomic History  . Marital status: Married    Spouse name: Not on file  . Number of children: Not on file  . Years of education: 12+  . Highest education level: Not on file  Occupational History  . Occupation: real Insurance account manager  Tobacco Use  . Smoking status: Never Smoker  . Smokeless tobacco: Never Used  Substance and Sexual Activity  . Alcohol use: No    Alcohol/week: 0.0 standard drinks  . Drug use: No  . Sexual activity: Yes  Other Topics Concern  . Not on file  Social History Narrative   Lives at home.   Right-handed.   Occasional caffeine use.   Tea every other day   Social Determinants of Health   Financial Resource Strain:   . Difficulty of Paying Living Expenses:   Food Insecurity:   . Worried About Programme researcher, broadcasting/film/video in the Last Year:   . Barista in the Last Year:   Transportation Needs:   . Freight forwarder (Medical):   [name] Lack of Transportation (Non-Medical):   Physical Activity:   . Days of Exercise per Week:   . Minutes of Exercise  per Session:   Stress:   . Feeling of Stress :   Social Connections:   . Frequency of Communication with Friends and Family:   . Frequency of Social Gatherings with Friends and Family:   . Attends Religious Services:   . Active Member of Clubs or Organizations:   . Attends Banker Meetings:   [name] Marital Status:   Intimate Partner Violence:   . Fear of Current or Ex-Partner:   . Emotionally Abused:   [name] Physically Abused:   . Sexually Abused:       PHYSICAL EXAM  There were no vitals filed for this visit. There is no height or weight on file to calculate BMI.  Generalized: Well developed, in no acute distress   Neurological examination  Mentation: Alert  oriented to time, place, history taking. Follows all commands speech and language fluent Cranial nerve II-XII: Pupils were equal round reactive to light. Extraocular movements were full, visual field were full on confrontational test. Facial sensation and strength were normal. Uvula tongue midline. Head turning and shoulder shrug  were normal and symmetric. Motor: The motor testing reveals 5 over 5 strength of all 4 extremities. Good symmetric motor tone is noted throughout.  Sensory: Sensory testing is intact to soft touch on all 4 extremities. No evidence of extinction is noted.  Coordination: Cerebellar testing reveals good finger-nose-finger and heel-to-shin bilaterally.  Gait and station: Gait is normal. Tandem gait is normal. Romberg is negative. No drift is seen.  Reflexes: Deep tendon reflexes are symmetric and normal bilaterally.   DIAGNOSTIC DATA (LABS, IMAGING, TESTING) - I reviewed patient records, labs, notes, testing and imaging myself where available.  Lab Results  Component Value Date   WBC 6.4 05/15/2019   HGB 16.4 05/15/2019   HCT 48.7 05/15/2019   MCV 75 (L) 05/15/2019   PLT 264 05/15/2019      Component Value Date/Time   NA 141 05/15/2019 1134   NA 141 09/13/2016 1303   K 4.8 05/15/2019  1134   K 4.7 09/13/2016 1303   CL 102 05/15/2019 1134   CO2 23 05/15/2019 1134   CO2 27 09/13/2016 1303   GLUCOSE 175 (H) 05/15/2019 1134   GLUCOSE 99 09/13/2016 1303   BUN 21 05/15/2019 1134   BUN 17.7 09/13/2016 1303   CREATININE 1.26 05/15/2019 1134   CREATININE 1.1 09/13/2016 1303   CALCIUM 9.8 05/15/2019 1134   CALCIUM 9.9 09/13/2016 1303   PROT 7.2 05/15/2019 1134   PROT 8.0 09/13/2016 1303   ALBUMIN 4.7 05/15/2019 1134   ALBUMIN 4.6 09/13/2016 1303   AST 28 05/15/2019 1134   AST 27 09/13/2016 1303   ALT 64 (H) 05/15/2019 1134   ALT 50 09/13/2016 1303   ALKPHOS 56 05/15/2019 1134   ALKPHOS 50 09/13/2016 1303   BILITOT 0.7 05/15/2019 1134   BILITOT 1.25 (H) 09/13/2016 1303   GFRNONAA 67 05/15/2019 1134   GFRNONAA 86 05/28/2016 0929   GFRAA 77 05/15/2019 1134   GFRAA >89 05/28/2016 0929   Lab Results  Component Value Date   CHOL 113 05/15/2019   HDL 37 (L) 05/15/2019   LDLCALC 61 05/15/2019   TRIG 69 05/15/2019   CHOLHDL 3.1 05/15/2019   Lab Results  Component Value Date   HGBA1C 8.9 (H) 05/15/2019   No results found for: [id number] Lab Results  Component Value Date   TSH 1.085 01/15/2014      ASSESSMENT AND PLAN 49 y.o. year old male  has a past medical history of Carpal tunnel syndrome, Diabetes mellitus, Hypertension, Obesity, Pulmonary embolism ([facility]) (2013), Shoulder pain, bilateral, and Sleep apnea. here with:  1.  Chronic migraine headaches 2.  New onset daily left-sided headaches -MRI of the brain was normal  3.  Obstructive sleep apnea -Has been evaluated by Dr. Vickey Huger, home sleep study was ordered   I spent 15 minutes with the patient. 50% of this time was spent   Margie Ege, [place], DNP 07/26/2019, 5:46 AM [facility] [address] [place], [place] [address] [phone]

## 2019-08-06 ENCOUNTER — Other Ambulatory Visit: Payer: Self-pay | Admitting: Nurse Practitioner

## 2019-08-06 DIAGNOSIS — Z760 Encounter for issue of repeat prescription: Secondary | ICD-10-CM

## 2019-08-08 ENCOUNTER — Ambulatory Visit: Payer: 59 | Attending: Internal Medicine

## 2019-08-08 DIAGNOSIS — Z23 Encounter for immunization: Secondary | ICD-10-CM

## 2019-08-08 NOTE — Progress Notes (Signed)
   Covid-19 Vaccination Clinic  Name:  Scott [name]    MRN: [id number] DOB: 10/14/1970  08/08/2019  Scott [name] was observed post Covid-19 immunization for 15 minutes without incident. He was provided with Vaccine Information Sheet and instruction to access the V-Safe system.   Scott [name] was instructed to call 911 with any severe reactions post vaccine: [name] Difficulty breathing  . Swelling of face and throat  . A fast heartbeat  . A bad rash all over body  . Dizziness and weakness   Immunizations Administered    Name Date Dose VIS Date Route   Pfizer COVID-19 Vaccine 08/08/2019 12:19 PM 0.3 mL 04/13/2019 Intramuscular   Manufacturer: [employer], [employer]   Lot: [id number]   NDC: [address]-6

## 2019-08-10 ENCOUNTER — Other Ambulatory Visit: Payer: Self-pay | Admitting: Pharmacist

## 2019-08-10 DIAGNOSIS — IMO0002 Reserved for concepts with insufficient information to code with codable children: Secondary | ICD-10-CM

## 2019-08-10 DIAGNOSIS — E1165 Type 2 diabetes mellitus with hyperglycemia: Secondary | ICD-10-CM

## 2019-08-10 MED ORDER — JENTADUETO 2.5-1000 MG PO TABS: 1.0000 | ORAL_TABLET | Freq: Two times a day (BID) | ORAL | 0 refills | Status: DC

## 2019-08-15 ENCOUNTER — Telehealth: Payer: Self-pay

## 2019-08-15 NOTE — Telephone Encounter (Signed)
[employer] PRIOR AUTH APPROVED THRU 08/13/2020

## 2019-08-29 ENCOUNTER — Encounter: Payer: Self-pay | Admitting: Family Medicine

## 2019-08-29 ENCOUNTER — Other Ambulatory Visit: Payer: Self-pay

## 2019-08-29 ENCOUNTER — Ambulatory Visit (INDEPENDENT_AMBULATORY_CARE_PROVIDER_SITE_OTHER): Payer: 59 | Admitting: Family Medicine

## 2019-08-29 VITALS — BP 124/86 | Ht 66.0 in | Wt 235.0 lb

## 2019-08-29 DIAGNOSIS — M65312 Trigger thumb, left thumb: Secondary | ICD-10-CM | POA: Diagnosis not present

## 2019-08-29 DIAGNOSIS — M25572 Pain in left ankle and joints of left foot: Secondary | ICD-10-CM

## 2019-08-29 MED ORDER — METHYLPREDNISOLONE ACETATE 40 MG/ML IJ SUSP
20.0000 mg | Freq: Once | INTRAMUSCULAR | Status: AC
  Administered 2019-08-29: 20 mg via INTRA_ARTICULAR

## 2019-08-29 NOTE — Patient Instructions (Signed)
You were given an injection for your trigger thumb. If this recurs again let me know and we can refer you to the hand surgeon.  Your foot ultrasound is reassuring. 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 The orthotics are important for this - go back to using these.  If it still doesn't feel comfortable let me know and we will take a look at the orthotics. Icing if needed. Tylenol, ibuprofen only if needed. Follow up with me in 5-6 weeks otherwise.

## 2019-08-29 NOTE — Progress Notes (Signed)
PCP: Claiborne Rigg, NP  Subjective:   HPI: Patient is a 49 y.o. male here for left thumb pain, left foot pain, right calf pain.  3/17: Pain is improving following walking boot for 6 weeks (4 weeks full time, 2 weeks at home but supportive shoe at work). Patient still has burning pain on sole of lateral midfoot near 5th metatarsal however focal sharp pain from initial encounter is resolved. Pain is most noticeable at end of workday where he has been on his feet all day. Has been using ibuprofen as needed for pain.   Patient additionally has complaint of occasional right calf pain. He describes it as a similar burning pain to his foot that began when he started wearing the walking boot. It stays locally within his calf and does not radiate distally down the Achilles nor proximally towards the hip.   4/28: Patient reports his calf feels better. Left foot still bothering him dorsally - less pain lateral foot. Wearing supportive shoes with extra insert - does not have orthotics with him now. Left thumb with catching of IP joint, soreness that has recurred. Injection helped for at least a couple months. No new injuries.  Past Medical History:  Diagnosis Date  . Carpal tunnel syndrome   . Diabetes mellitus   . Hypertension   . Obesity   . Pulmonary embolism (H[facility]) 2013   He has a history of HTN.  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 A repeat scan done in 2017 did not demonstrate evidence of emboli. Neither study demonstrated any coronary calcifications.   . Shoulder pain, bilateral   . Sleep apnea    was fitted for mask and never got machine - too costly    Current Outpatient Medications on File Prior to Visit  Medication Sig Dispense Refill  . amLODipine (NORVASC) 10 MG tablet Take 1 tablet (10 mg total) by mouth daily. 90 tablet 3  . ASPIRIN LOW DOSE 81 MG EC tablet TAKE ONE TABLET BY MOUTH DAILY 30 tablet 2  . atorvastatin (LIPITOR) 20 MG tablet  Take 1 tablet (20 mg total) by mouth daily. 90 tablet 1  . ergocalciferol (VITAMIN D2) 1.25 MG (50000 UT) capsule Vitamin D2 1,250 mcg (50,000 unit) capsule  Take 1 capsule(s) every week by oral route for 30 days.    . fluticasone (FLONASE) 50 MCG/ACT nasal spray Place 2 sprays into both nostrils daily. 16 g 6  . glucose blood test strip Use as instructed. Inject into the skin once daily. 100 each 12  . ibuprofen (ADVIL) 600 MG tablet Take 1 tablet (600 mg total) by mouth every 8 (eight) hours as needed (Take with food.). 30 tablet 1  . Insulin Glargine (LANTUS) 100 UNIT/ML Solostar Pen Inject 10 Units into the skin daily at 10 pm. 15 mL 11  . Insulin Pen Needle (B-D UF III MINI PEN NEEDLES) 31G X 5 MM MISC Use as instructed. Inject into the skin once nightly. 100 each 6  . JARDIANCE 25 MG TABS tablet Take 25 mg by mouth daily.    Demetra Shiner Devices (ONE TOUCH DELICA LANCING DEV) MISC Use as instructed. Inject into the skin once daily 1 each prn  . linaGLIPtin-metFORMIN HCl (JENTADUETO) 2.08-998 MG TABS Take 1 tablet by mouth 2 (two) times daily. 180 tablet 0  . lisinopril (ZESTRIL) 20 MG tablet Take 20 mg by mouth daily.    [name] loratadine (CLARITIN REDITABS) 10 MG dissolvable tablet Take 1  tablet (10 mg total) by mouth daily. 30 tablet 3  . omeprazole (PRILOSEC) 20 MG capsule Take 1 capsule (20 mg total) by mouth daily as needed (heartburn). 30 capsule 3   No current facility-administered medications on file prior to visit.    Past Surgical History:  Procedure Laterality Date  . CARPAL TUNNEL RELEASE Right 12/04/2015   Procedure: RIGHT CARPAL TUNNEL RELEASE;  Surgeon: Betha Loa, MD;  Location: [facility];  Service: Orthopedics;  Laterality: Right;  RIGHT CARPAL TUNNEL RELEASE  . CARPAL TUNNEL RELEASE Right 12/2015   [facility]  . CARPAL TUNNEL RELEASE Left 04/01/2016   Procedure: LEFT CARPAL TUNNEL RELEASE;  Surgeon: Betha Loa, MD;  Location: [facility];  Service:  Orthopedics;  Laterality: Left;  . right hand surgery    . ROTATOR CUFF REPAIR      No Known Allergies  Social History   Socioeconomic History  . Marital status: Married    Spouse name: Not on file  . Number of children: Not on file  . Years of education: 12+  . Highest education level: Not on file  Occupational History  . Occupation: real Insurance account manager  Tobacco Use  . Smoking status: Never Smoker  . Smokeless tobacco: Never Used  Substance and Sexual Activity  . Alcohol use: No    Alcohol/week: 0.0 standard drinks  . Drug use: No  . Sexual activity: Yes  Other Topics Concern  . Not on file  Social History Narrative   Lives at home.   Right-handed.   Occasional caffeine use.   Tea every other day   Social Determinants of Health   Financial Resource Strain:   . Difficulty of Paying Living Expenses:   Food Insecurity:   . Worried About Programme researcher, broadcasting/film/video in the Last Year:   . Barista in the Last Year:   Transportation Needs:   . Freight forwarder (Medical):   [name] Lack of Transportation (Non-Medical):   Physical Activity:   . Days of Exercise per Week:   . Minutes of Exercise per Session:   Stress:   . Feeling of Stress :   Social Connections:   . Frequency of Communication with Friends and Family:   . Frequency of Social Gatherings with Friends and Family:   . Attends Religious Services:   . Active Member of Clubs or Organizations:   . Attends Banker Meetings:   [name] Marital Status:   Intimate Partner Violence:   . Fear of Current or Ex-Partner:   . Emotionally Abused:   [name] Physically Abused:   . Sexually Abused:     Family History  Problem Relation Age of Onset  . Hypertension Mother   . Kidney disease Father   . Hypertension Other   . Hyperlipidemia Other   . Diabetes Other     BP 124/86   Ht 5\' 6"  (1.676 m)   Wt 235 lb (106.6 kg)   BMI 37.93 kg/m   Review of Systems: See HPI above.     Objective:  Physical  Exam:  Gen: NAD, comfortable in exam room  Left hand: No deformity. FROM with 5/5 strength flexion and extension of thumb at IP and MCP joints. TTP A1 pulley with small nodule.  No other tenderness. NVI distally.  Left foot: No gross deformity, swelling, ecchymoses FROM ankle without pain. TTP distal dorsal foot over distal 3rd-5th metatarsals, over extensor hallucis longus. Negative ant drawer and talar tilt.  Negative syndesmotic compression. Thompsons test negative. NV intact distally.   MSK u/s left foot:  Small amount tenosynovitis extensor hallucis longus.  Spurring metatarsal side of 4th MTP joint with small effusion.  No cortical irregularity, edema overlying cortex of 3rd-5th metatarsals.  Assessment & Plan:  1. Left trigger thumb - repeated injection today.  Discussed if this recurs again would refer to hand surgery.  After informed written consent timeout was performed, patient was seated on exam table. Left thumb over A1 pulley was prepped with alcohol swab and injected with 0.5:0.31mL  bupivicaine: depomedrol. Patient tolerated the procedure well without immediate complications.  2. 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  Icing, tylenol or ibuprofen if needed.  Stressed importance of orthotics.

## 2019-09-26 ENCOUNTER — Ambulatory Visit: Payer: 59 | Admitting: Family Medicine

## 2019-09-30 ENCOUNTER — Other Ambulatory Visit: Payer: Self-pay | Admitting: Physician Assistant

## 2019-10-03 ENCOUNTER — Ambulatory Visit
Admission: RE | Admit: 2019-10-03 | Discharge: 2019-10-03 | Disposition: A | Discharge status: Home / Self Care | Payer: 59 | Source: Ambulatory Visit | Attending: Family Medicine | Admitting: Family Medicine

## 2019-10-03 ENCOUNTER — Other Ambulatory Visit: Payer: Self-pay

## 2019-10-03 ENCOUNTER — Encounter: Payer: Self-pay | Admitting: Family Medicine

## 2019-10-03 ENCOUNTER — Ambulatory Visit (INDEPENDENT_AMBULATORY_CARE_PROVIDER_SITE_OTHER): Payer: 59 | Admitting: Family Medicine

## 2019-10-03 VITALS — BP 128/90 | Ht 66.0 in | Wt 238.0 lb

## 2019-10-03 DIAGNOSIS — M79642 Pain in left hand: Secondary | ICD-10-CM | POA: Diagnosis not present

## 2019-10-03 DIAGNOSIS — M25572 Pain in left ankle and joints of left foot: Secondary | ICD-10-CM | POA: Diagnosis not present

## 2019-10-03 NOTE — Progress Notes (Signed)
MRI and xray orders placed

## 2019-10-03 NOTE — Progress Notes (Signed)
PCP: [name], NP  Subjective:   HPI: Patient is a 49 y.o. male here for left thumb pain, left foot pain.  3/17: Pain is improving following walking boot for 6 weeks (4 weeks full time, 2 weeks at home but supportive shoe at work). Patient still has burning pain on sole of lateral midfoot near 5th metatarsal however focal sharp pain from initial encounter is resolved. Pain is most noticeable at end of workday where he has been on his feet all day. Has been using ibuprofen as needed for pain.   Patient additionally has complaint of occasional right calf pain. He describes it as a similar burning pain to his foot that began when he started wearing the walking boot. It stays locally within his calf and does not radiate distally down the Achilles nor proximally towards the hip.   4/28: Patient reports his calf feels better. Left foot still bothering him dorsally - less pain lateral foot. Wearing supportive shoes with extra insert - does not have orthotics with him now. Left thumb with catching of IP joint, soreness that has recurred. Injection helped for at least a couple months. No new injuries.  6/2: Patient reports his left thumb is improved but is still stiff around the IP joint of the thumb. No catching or locking. Left foot still with dorsal pain despite prior boot for 6+ weeks and now wearing custom orthotics. No new injuries. Past Medical History:  Diagnosis Date  . Carpal tunnel syndrome   . Diabetes mellitus   . Hypertension   . Obesity   . Pulmonary embolism ([place]) 2013   He has a history of HTN.  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 A repeat scan done in 2017 did not demonstrate evidence of emboli. Neither study demonstrated any coronary calcifications.   . Shoulder pain, bilateral   . Sleep apnea    was fitted for mask and never got machine - too costly    Current Outpatient Medications on File Prior to Visit  Medication Sig  Dispense Refill  . amLODipine (NORVASC) 10 MG tablet Take 1 tablet (10 mg total) by mouth daily. 90 tablet 3  . ASPIRIN LOW DOSE 81 MG EC tablet TAKE ONE TABLET BY MOUTH DAILY 30 tablet 2  . atorvastatin (LIPITOR) 20 MG tablet Take 1 tablet (20 mg total) by mouth daily. 90 tablet 1  . ergocalciferol (VITAMIN D2) 1.25 MG (50000 UT) capsule Vitamin D2 1,250 mcg (50,000 unit) capsule  Take 1 capsule(s) every week by oral route for 30 days.    . fluticasone (FLONASE) 50 MCG/ACT nasal spray Place 2 sprays into both nostrils daily. 16 g 6  . glucose blood test strip Use as instructed. Inject into the skin once daily. 100 each 12  . ibuprofen (ADVIL) 600 MG tablet Take 1 tablet (600 mg total) by mouth every 8 (eight) hours as needed (Take with food.). 30 tablet 1  . Insulin Glargine (LANTUS) 100 UNIT/ML Solostar Pen Inject 10 Units into the skin daily at 10 pm. 15 mL 11  . Insulin Pen Needle (B-D UF III MINI PEN NEEDLES) 31G X 5 MM MISC Use as instructed. Inject into the skin once nightly. 100 each 6  . JARDIANCE 25 MG TABS tablet Take 25 mg by mouth daily.    Elmore Guise Devices (ONE TOUCH DELICA LANCING DEV) MISC Use as instructed. Inject into the skin once daily 1 each prn  . linaGLIPtin-metFORMIN HCl (JENTADUETO) 2.08-998  MG TABS Take 1 tablet by mouth 2 (two) times daily. 180 tablet 0  . lisinopril (ZESTRIL) 20 MG tablet Take 20 mg by mouth daily.    [name] loratadine (CLARITIN REDITABS) 10 MG dissolvable tablet Take 1 tablet (10 mg total) by mouth daily. 30 tablet 3  . omeprazole (PRILOSEC) 20 MG capsule Take 1 capsule (20 mg total) by mouth daily as needed (heartburn). 30 capsule 3   No current facility-administered medications on file prior to visit.    Past Surgical History:  Procedure Laterality Date  . CARPAL TUNNEL RELEASE Right 12/04/2015   Procedure: RIGHT CARPAL TUNNEL RELEASE;  Surgeon: Betha Loa, MD;  Location: [facility];  Service: Orthopedics;  Laterality: Right;  RIGHT  CARPAL TUNNEL RELEASE  . CARPAL TUNNEL RELEASE Right 12/2015   [facility]  . CARPAL TUNNEL RELEASE Left 04/01/2016   Procedure: LEFT CARPAL TUNNEL RELEASE;  Surgeon: Betha Loa, MD;  Location: [facility];  Service: Orthopedics;  Laterality: Left;  . right hand surgery    . ROTATOR CUFF REPAIR      No Known Allergies  Social History   Socioeconomic History  . Marital status: Married    Spouse name: Not on file  . Number of children: Not on file  . Years of education: 12+  . Highest education level: Not on file  Occupational History  . Occupation: real Insurance account manager  Tobacco Use  . Smoking status: Never Smoker  . Smokeless tobacco: Never Used  Substance and Sexual Activity  . Alcohol use: No    Alcohol/week: 0.0 standard drinks  . Drug use: No  . Sexual activity: Yes  Other Topics Concern  . Not on file  Social History Narrative   Lives at home.   Right-handed.   Occasional caffeine use.   Tea every other day   Social Determinants of Health   Financial Resource Strain:   . Difficulty of Paying Living Expenses:   Food Insecurity:   . Worried About Programme researcher, broadcasting/film/video in the Last Year:   . Barista in the Last Year:   Transportation Needs:   . Freight forwarder (Medical):   [name] Lack of Transportation (Non-Medical):   Physical Activity:   . Days of Exercise per Week:   . Minutes of Exercise per Session:   Stress:   . Feeling of Stress :   Social Connections:   . Frequency of Communication with Friends and Family:   . Frequency of Social Gatherings with Friends and Family:   . Attends Religious Services:   . Active Member of Clubs or Organizations:   . Attends Banker Meetings:   [name] Marital Status:   Intimate Partner Violence:   . Fear of Current or Ex-Partner:   . Emotionally Abused:   [name] Physically Abused:   . Sexually Abused:     Family History  Problem Relation Age of Onset  . Hypertension Mother   . Kidney disease  Father   . Hypertension Other   . Hyperlipidemia Other   . Diabetes Other     BP 128/90   Ht 5\' 6"  (1.676 m)   Wt 238 lb (108 kg)   BMI 38.41 kg/m   Review of Systems: See HPI above.     Objective:  Physical Exam:  Gen: NAD, comfortable in exam room  Left hand: No deformity. FROM with 5/5 strength. No tenderness to palpation including 1st IP and A1 pulley 1st digit. NVI  distally.  Left foot: No deformity, swelling, bruising.[name] FROM with normal strength. TTP dorsal mid-4th and 5th metatarsals.  No 5th MT or other tenderness.   NVI distally.   Assessment & Plan:  1. Left trigger thumb - improved though still with some stiffness felt at 1st IP.  Will get x-rays to further assess degree of arthropathy.  If triggering recurs would refer to hand surgeon.  S/p 2 injections.  2. Left foot pain - stress reaction 5th MT has resolved.  Continues to have pain 3rd and 4th metatarsals.  Radiographs were negative previously.  Old MRI was of hind and midfoot and preceded this current pain.  WIll go ahead with MRI forefoot as his pain has continued despite boot, orthotics for over 6 weeks each.

## 2019-10-03 NOTE — Patient Instructions (Signed)
Get x-rays of your left hand after you leave today. We will also go ahead with an MRI of your left forefoot to assess for a stress fracture. Follow up with me after the MRI for a no charge visit to go over results and next steps.

## 2019-10-10 ENCOUNTER — Other Ambulatory Visit: Payer: Self-pay | Admitting: *Deleted

## 2019-10-10 ENCOUNTER — Telehealth: Payer: Self-pay

## 2019-10-10 DIAGNOSIS — M25572 Pain in left ankle and joints of left foot: Secondary | ICD-10-CM

## 2019-10-10 NOTE — Telephone Encounter (Signed)
Referral placed to [facility] [place] with Dr. Betha Loa. Appt: 10/29/19 @ 8:45 am. Pt is aware.

## 2019-10-29 ENCOUNTER — Other Ambulatory Visit: Payer: Self-pay

## 2019-10-29 ENCOUNTER — Ambulatory Visit
Admission: RE | Admit: 2019-10-29 | Discharge: 2019-10-29 | Disposition: A | Discharge status: Home / Self Care | Payer: 59 | Source: Ambulatory Visit | Attending: Family Medicine | Admitting: Family Medicine

## 2019-10-29 DIAGNOSIS — M25572 Pain in left ankle and joints of left foot: Secondary | ICD-10-CM

## 2019-10-30 ENCOUNTER — Other Ambulatory Visit: Payer: Self-pay | Admitting: Nurse Practitioner

## 2019-11-01 ENCOUNTER — Other Ambulatory Visit: Payer: 59

## 2019-11-08 ENCOUNTER — Telehealth: Payer: Self-pay | Admitting: Nurse Practitioner

## 2019-11-08 ENCOUNTER — Ambulatory Visit: Payer: 59 | Admitting: Physician Assistant

## 2019-11-08 NOTE — Telephone Encounter (Signed)
patient missed his appointment time today and requested to get an a1c done before he leaves out of town. The patient requested for the orders to be placed and he would come ad do the blood work. Please follow up at your earliest convenience.

## 2019-11-09 NOTE — Telephone Encounter (Signed)
Spoke to patient. Pt. Stated he is going to keep his OV. W/ PCP. He will do blood work then.

## 2019-11-20 ENCOUNTER — Other Ambulatory Visit: Payer: Self-pay | Admitting: Family Medicine

## 2019-11-20 DIAGNOSIS — Z760 Encounter for issue of repeat prescription: Secondary | ICD-10-CM

## 2019-11-20 NOTE — Telephone Encounter (Signed)
Requested Prescriptions  Pending Prescriptions Disp Refills   ASPIRIN LOW DOSE 81 MG EC tablet [Pharmacy Med Name: ASPIRIN EC 81 MG TABLET] 30 tablet 1    Sig: TAKE ONE TABLET BY MOUTH DAILY     Analgesics:  NSAIDS - aspirin Passed - 11/20/2019  6:10 AM      Passed - Patient is not pregnant      Passed - Valid encounter within last 12 months    Recent Outpatient Visits          6 months ago Type 2 diabetes mellitus with hyperglycemia, without long-term current use of insulin [facility])   [facility] [place], [place] W, NP   9 months ago Diabetes mellitus type 2, uncontrolled, with complications [facility])   [facility] [place], Shea Stakes, NP   10 months ago Essential hypertension   [facility] [place], Marylene Land M, [place]   11 months ago Essential hypertension   [facility] [address] [facility] [place], [place], [place]   11 months ago Nonintractable headache, unspecified chronicity pattern, unspecified headache type   [facility] [place], Marzella Schlein, [place]      Future Appointments            In 2 weeks Claiborne Rigg, NP [employer] [facility]

## 2019-11-20 NOTE — Telephone Encounter (Signed)
Courtesy refill until appt. Pt has appt 12/05/2019

## 2019-11-29 ENCOUNTER — Other Ambulatory Visit: Payer: Self-pay | Admitting: Family Medicine

## 2019-12-05 ENCOUNTER — Encounter: Payer: Self-pay | Admitting: Nurse Practitioner

## 2019-12-05 ENCOUNTER — Other Ambulatory Visit: Payer: Self-pay

## 2019-12-05 ENCOUNTER — Ambulatory Visit: Payer: 59 | Attending: Nurse Practitioner | Admitting: Nurse Practitioner

## 2019-12-05 ENCOUNTER — Ambulatory Visit: Payer: 59 | Admitting: Physician Assistant

## 2019-12-05 ENCOUNTER — Ambulatory Visit (HOSPITAL_BASED_OUTPATIENT_CLINIC_OR_DEPARTMENT_OTHER): Payer: 59 | Admitting: Pharmacist

## 2019-12-05 VITALS — BP 116/77 | HR 82 | Temp 97.7°F | Wt 230.0 lb

## 2019-12-05 DIAGNOSIS — I10 Essential (primary) hypertension: Secondary | ICD-10-CM | POA: Diagnosis not present

## 2019-12-05 DIAGNOSIS — Z125 Encounter for screening for malignant neoplasm of prostate: Secondary | ICD-10-CM

## 2019-12-05 DIAGNOSIS — E1165 Type 2 diabetes mellitus with hyperglycemia: Secondary | ICD-10-CM

## 2019-12-05 DIAGNOSIS — R519 Headache, unspecified: Secondary | ICD-10-CM

## 2019-12-05 DIAGNOSIS — K219 Gastro-esophageal reflux disease without esophagitis: Secondary | ICD-10-CM

## 2019-12-05 DIAGNOSIS — Z794 Long term (current) use of insulin: Secondary | ICD-10-CM | POA: Diagnosis not present

## 2019-12-05 DIAGNOSIS — E782 Mixed hyperlipidemia: Secondary | ICD-10-CM | POA: Diagnosis not present

## 2019-12-05 DIAGNOSIS — Z1211 Encounter for screening for malignant neoplasm of colon: Secondary | ICD-10-CM

## 2019-12-05 DIAGNOSIS — Z23 Encounter for immunization: Secondary | ICD-10-CM

## 2019-12-05 DIAGNOSIS — R718 Other abnormality of red blood cells: Secondary | ICD-10-CM | POA: Diagnosis not present

## 2019-12-05 LAB — POCT GLYCOSYLATED HEMOGLOBIN (HGB A1C): Hemoglobin A1C: 7.1 % — AB (ref 4.0–5.6)

## 2019-12-05 LAB — GLUCOSE, POCT (MANUAL RESULT ENTRY): POC Glucose: 221 mg/dl — AB (ref 70–99)

## 2019-12-05 MED ORDER — INSULIN GLARGINE 100 UNIT/ML SOLOSTAR PEN: 10.0000 [IU] | PEN_INJECTOR | Freq: Every day | SUBCUTANEOUS | 11 refills | Status: DC

## 2019-12-05 MED ORDER — FLUTICASONE PROPIONATE 50 MCG/ACT NA SUSP: 2.0000 | Freq: Every day | NASAL | 6 refills | Status: DC

## 2019-12-05 MED ORDER — OMEPRAZOLE 20 MG PO CPDR: 20.0000 mg | DELAYED_RELEASE_CAPSULE | Freq: Every day | ORAL | 3 refills | Status: DC | PRN

## 2019-12-05 MED ORDER — ATORVASTATIN CALCIUM 20 MG PO TABS: 20.0000 mg | ORAL_TABLET | Freq: Every day | ORAL | 1 refills | Status: DC

## 2019-12-05 MED ORDER — JENTADUETO 2.5-1000 MG PO TABS: 1.0000 | ORAL_TABLET | Freq: Two times a day (BID) | ORAL | 0 refills | Status: DC

## 2019-12-05 MED ORDER — JARDIANCE 25 MG PO TABS: 25.0000 mg | ORAL_TABLET | Freq: Every day | ORAL | 2 refills | Status: DC

## 2019-12-05 MED ORDER — ONETOUCH DELICA LANCING DEV MISC: 99 refills | Status: DC

## 2019-12-05 MED ORDER — LISINOPRIL 20 MG PO TABS: 20.0000 mg | ORAL_TABLET | Freq: Every day | ORAL | 1 refills | Status: DC

## 2019-12-05 MED ORDER — GLUCOSE BLOOD VI STRP: ORAL_STRIP | 12 refills | Status: DC

## 2019-12-05 MED ORDER — AMLODIPINE BESYLATE 10 MG PO TABS: 10.0000 mg | ORAL_TABLET | Freq: Every day | ORAL | 3 refills | Status: DC

## 2019-12-05 NOTE — Progress Notes (Signed)
Assessment & Plan:  Scott [name] was seen today for diabetes.  Diagnoses and all orders for this visit:  Type 2 diabetes mellitus with hyperglycemia, with long-term current use of insulin (HCC) -     Glucose (CBG) -     HgB A1c -     Microalbumin/Creatinine Ratio, Urine -     CMP14+EGFR -     Referral to Nutrition and Diabetes Services -     glucose blood test strip; Use as instructed. Inject into the skin once daily. E11.65 -     insulin glargine (LANTUS) 100 UNIT/ML Solostar Pen; Inject 10 Units into the skin daily at 10 pm. -     JARDIANCE 25 MG TABS tablet; Take 1 tablet (25 mg total) by mouth daily. -     linaGLIPtin-metFORMIN HCl (JENTADUETO) 2.08-998 MG TABS; Take 1 tablet by mouth 2 (two) times daily. -     Lancet Devices (ONE TOUCH DELICA LANCING DEV) MISC; Use as instructed. Inject into the skin once daily Continue blood sugar control as discussed in office today, low carbohydrate diet, and regular physical exercise as tolerated, 150 minutes per week (30 min each day, 5 days per week, or 50 min 3 days per week). Keep blood sugar logs with fasting goal of 90-130 mg/dl, post prandial (after you eat) less than 180.  For Hypoglycemia: BS <60 and Hyperglycemia BS >400; contact the clinic ASAP. Annual eye exams and foot exams are recommended.  Essential hypertension -     amLODipine (NORVASC) 10 MG tablet; Take 1 tablet (10 mg total) by mouth daily. -     lisinopril (ZESTRIL) 20 MG tablet; Take 1 tablet (20 mg total) by mouth daily. Continue all antihypertensives as prescribed.  Remember to bring in your blood pressure log with you for your follow up appointment.  DASH/Mediterranean Diets are healthier choices for HTN.    Mixed hyperlipidemia -     atorvastatin (LIPITOR) 20 MG tablet; Take 1 tablet (20 mg total) by mouth daily. INSTRUCTIONS: Work on a low fat, heart healthy diet and participate in regular aerobic exercise program by working out at least 150 minutes per week; 5 days a  week-30 minutes per day. Avoid red meat/beef/steak,  fried foods. junk foods, sodas, sugary drinks, unhealthy snacking, alcohol and smoking.  Drink at least 80 oz of water per day and monitor your carbohydrate intake daily.    Low mean corpuscular volume (MCV) -     CBC  Gastroesophageal reflux disease without esophagitis -     omeprazole (PRILOSEC) 20 MG capsule; Take 1 capsule (20 mg total) by mouth daily as needed (heartburn). INSTRUCTIONS: Avoid GERD Triggers: acidic, spicy or fried foods, caffeine, coffee, sodas,  alcohol and chocolate.   Colon cancer screening -     Cancel: Ambulatory referral to Gastroenterology  Prostate cancer screening -     PSA  Nonintractable headache, unspecified chronicity pattern, unspecified headache type -     fluticasone (FLONASE) 50 MCG/ACT nasal spray; Place 2 sprays into both nostrils daily.    Patient has been counseled on age-appropriate routine health concerns for screening and prevention. These are reviewed and up-to-date. Referrals have been placed accordingly. Immunizations are up-to-date or declined.    Subjective:   Chief Complaint  Patient presents with  . Diabetes    Pt. is here for diabetes follow up and medication refill.    HPI Scott [name] 49 y.o. male presents to office today for follow up.  PMH significant for Carpal  tunnel syndrome, Diabetes mellitus, Diastolic heart failure ([place]), Hypertension, Obesity, Pulmonary embolism ([place]) (2013), and Sleep apnea.  Wants to be referred back to nutritionist for diabetes nutrition education. .    DM TYPE 2 A1c down from 8.9. Needs to increase activity and make dietary modifications. Current medications include: Jardiance 25 mg daily, jentadueto 2.08-998 mg BID and lantus 10 units nightly. Taking ACE and STATIN. LDL at goal.  Lab Results  Component Value Date   HGBA1C 7.1 (A) 12/05/2019   Lab Results  Component Value Date   [place] 61 05/15/2019   Essential  Hypertension Well controlled. Taking amlodipine 10 mg daily and lisinopril 20 mg daily. Denies chest pain, shortness of breath, palpitations, lightheadedness, dizziness, headaches or BLE edema.  BP Readings from Last 3 Encounters:  12/05/19 116/77  10/03/19 128/90  08/29/19 124/86   Review of Systems  Constitutional: Negative for fever, malaise/fatigue and weight loss.  HENT: Negative.  Negative for nosebleeds.   Eyes: Negative.  Negative for blurred vision, double vision and photophobia.  Respiratory: Negative.  Negative for cough and shortness of breath.   Cardiovascular: Negative.  Negative for chest pain, palpitations and leg swelling.  Gastrointestinal: Negative.  Negative for heartburn, nausea and vomiting.  Musculoskeletal: Negative.  Negative for myalgias.  Neurological: Negative for dizziness, focal weakness, seizures and headaches.  Psychiatric/Behavioral: Negative.  Negative for suicidal ideas.    Past Medical History:  Diagnosis Date  . Carpal tunnel syndrome   . Diabetes mellitus   . Diastolic heart failure ([place])   . Hypertension   . Obesity   . Pulmonary embolism ([place]) 2013   He has a history of HTN.  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 A repeat scan done in 2017 did not demonstrate evidence of emboli. Neither study demonstrated any coronary calcifications.   . Shoulder pain, bilateral   . Sleep apnea    was fitted for mask and never got machine - too costly    Past Surgical History:  Procedure Laterality Date  . CARPAL TUNNEL RELEASE Right 12/04/2015   Procedure: RIGHT CARPAL TUNNEL RELEASE;  Surgeon: Leanora Cover, MD;  Location: [facility];  Service: Orthopedics;  Laterality: Right;  RIGHT CARPAL TUNNEL RELEASE  . CARPAL TUNNEL RELEASE Right 12/2015   [facility]  . CARPAL TUNNEL RELEASE Left 04/01/2016   Procedure: LEFT CARPAL TUNNEL RELEASE;  Surgeon: Leanora Cover, MD;  Location: [facility];  Service:  Orthopedics;  Laterality: Left;  . right hand surgery    . ROTATOR CUFF REPAIR      Family History  Problem Relation Age of Onset  . Hypertension Mother   . Kidney disease Father   . Hypertension Other   . Hyperlipidemia Other   . Diabetes Other     Social History Reviewed with no changes to be made today.   Outpatient Medications Prior to Visit  Medication Sig Dispense Refill  . ASPIRIN LOW DOSE 81 MG EC tablet TAKE ONE TABLET BY MOUTH DAILY 90 tablet 1  . ibuprofen (ADVIL) 600 MG tablet Take 1 tablet (600 mg total) by mouth every 8 (eight) hours as needed (Take with food.). 30 tablet 1  . Insulin Pen Needle (B-D UF III MINI PEN NEEDLES) 31G X 5 MM MISC Use as instructed. Inject into the skin once nightly. 100 each 6  . loratadine (CLARITIN REDITABS) 10 MG dissolvable tablet Take 1 tablet (10 mg total) by mouth daily. 30 tablet 3  .  amLODipine (NORVASC) 10 MG tablet Take 1 tablet (10 mg total) by mouth daily. 90 tablet 3  . fluticasone (FLONASE) 50 MCG/ACT nasal spray Place 2 sprays into both nostrils daily. 16 g 6  . glucose blood test strip Use as instructed. Inject into the skin once daily. 100 each 12  . JARDIANCE 25 MG TABS tablet Take 25 mg by mouth daily.    Elmore Guise Devices (ONE TOUCH DELICA LANCING DEV) MISC Use as instructed. Inject into the skin once daily 1 each prn  . linaGLIPtin-metFORMIN HCl (JENTADUETO) 2.08-998 MG TABS Take 1 tablet by mouth 2 (two) times daily. 180 tablet 0  . lisinopril (ZESTRIL) 20 MG tablet TAKE ONE TABLET BY MOUTH DAILY         14 tablet 0  . omeprazole (PRILOSEC) 20 MG capsule Take 1 capsule (20 mg total) by mouth daily as needed (heartburn). 30 capsule 3  . atorvastatin (LIPITOR) 20 MG tablet Take 1 tablet (20 mg total) by mouth daily. 90 tablet 1  . ergocalciferol (VITAMIN D2) 1.25 MG (50000 UT) capsule Vitamin D2 1,250 mcg (50,000 unit) capsule  Take 1 capsule(s) every week by oral route for 30 days. (Patient not taking: Reported on  12/05/2019)    . Insulin Glargine (LANTUS) 100 UNIT/ML Solostar Pen Inject 10 Units into the skin daily at 10 pm. 15 mL 11   No facility-administered medications prior to visit.    No Known Allergies     Objective:    BP 116/77 (BP Location: Left Arm, Patient Position: Sitting, Cuff Size: Large)   Pulse 82   Temp 97.7 F (36.5 C) (Temporal)   Wt 230 lb (104.3 kg)   SpO2 98%   BMI 37.12 kg/m  Wt Readings from Last 3 Encounters:  12/05/19 230 lb (104.3 kg)  10/03/19 238 lb (108 kg)  08/29/19 235 lb (106.6 kg)    Physical Exam Vitals and nursing note reviewed.  Constitutional:      Appearance: He is well-developed.  HENT:     Head: Normocephalic and atraumatic.  Cardiovascular:     Rate and Rhythm: Normal rate and regular rhythm.     Heart sounds: Normal heart sounds. No murmur heard.  No friction rub. No gallop.   Pulmonary:     Effort: Pulmonary effort is normal. No tachypnea or respiratory distress.     Breath sounds: Normal breath sounds. No decreased breath sounds, wheezing, rhonchi or rales.  Chest:     Chest wall: No tenderness.  Abdominal:     General: Bowel sounds are normal.     Palpations: Abdomen is soft.  Musculoskeletal:        General: Normal range of motion.     Cervical back: Normal range of motion.  Skin:    General: Skin is warm and dry.  Neurological:     Mental Status: He is alert and oriented to person, place, and time.     Coordination: Coordination normal.  Psychiatric:        Behavior: Behavior normal. Behavior is cooperative.        Thought Content: Thought content normal.        Judgment: Judgment normal.          Patient has been counseled extensively about nutrition and exercise as well as the importance of adherence with medications and regular follow-up. The patient was given clear instructions to go to ER or return to medical center if symptoms don't improve, worsen or new problems develop. The  patient verbalized understanding.    Follow-up: Return in about 3 months (around 03/06/2020) for shingrix/BP .   [name], FNP-BC [facility] and [facility] [place], [place]   12/06/2019, 11:52 AM

## 2019-12-05 NOTE — Progress Notes (Signed)
Patient presents for vaccination against zoster per orders of Zelda. Consent given. Counseling provided. No contraindications exists. Vaccine administered without incident.   Luke Van Ausdall, PharmD, CPP Clinical Pharmacist [facility] [phone]  

## 2019-12-06 ENCOUNTER — Encounter: Payer: Self-pay | Admitting: Nurse Practitioner

## 2019-12-06 LAB — CBC
Hematocrit: 50 % (ref 37.5–51.0)
Hemoglobin: 16.4 g/dL (ref 13.0–17.7)
MCH: 25.3 pg — ABNORMAL LOW (ref 26.6–33.0)
MCHC: 32.8 g/dL (ref 31.5–35.7)
MCV: 77 fL — ABNORMAL LOW (ref 79–97)
Platelets: 261 10*3/uL (ref 150–450)
RBC: 6.49 x10E6/uL — ABNORMAL HIGH (ref 4.14–5.80)
RDW: 14.9 % (ref 11.6–15.4)
WBC: 5.1 10*3/uL (ref 3.4–10.8)

## 2019-12-06 LAB — CMP14+EGFR
ALT: 40 IU/L (ref 0–44)
AST: 26 IU/L (ref 0–40)
Albumin/Globulin Ratio: 1.7 (ref 1.2–2.2)
Albumin: 4.7 g/dL (ref 4.0–5.0)
Alkaline Phosphatase: 54 IU/L (ref 48–121)
BUN/Creatinine Ratio: 13 (ref 9–20)
BUN: 14 mg/dL (ref 6–24)
Bilirubin Total: 0.7 mg/dL (ref 0.0–1.2)
CO2: 21 mmol/L (ref 20–29)
Calcium: 9.5 mg/dL (ref 8.7–10.2)
Chloride: 100 mmol/L (ref 96–106)
Creatinine, Ser: 1.12 mg/dL (ref 0.76–1.27)
GFR calc Af Amer: 88 mL/min/{1.73_m2} (ref >59)
GFR calc non Af Amer: 76 mL/min/{1.73_m2} (ref >59)
Globulin, Total: 2.8 g/dL (ref 1.5–4.5)
Glucose: 146 mg/dL — ABNORMAL HIGH (ref 65–99)
Potassium: 4.4 mmol/L (ref 3.5–5.2)
Sodium: 140 mmol/L (ref 134–144)
Total Protein: 7.5 g/dL (ref 6.0–8.5)

## 2019-12-06 LAB — MICROALBUMIN / CREATININE URINE RATIO
Creatinine, Urine: 76.3 mg/dL
Microalb/Creat Ratio: 12 mg/g creat (ref 0–29)
Microalbumin, Urine: 9.1 ug/mL

## 2019-12-06 LAB — PSA: Prostate Specific Ag, Serum: 0.6 ng/mL (ref 0.0–4.0)

## 2019-12-18 ENCOUNTER — Ambulatory Visit
Admission: RE | Admit: 2019-12-18 | Discharge: 2019-12-18 | Disposition: A | Discharge status: Home / Self Care | Payer: 59 | Source: Ambulatory Visit | Attending: Family Medicine | Admitting: Family Medicine

## 2019-12-18 ENCOUNTER — Other Ambulatory Visit: Payer: Self-pay

## 2019-12-18 DIAGNOSIS — M25572 Pain in left ankle and joints of left foot: Secondary | ICD-10-CM

## 2020-01-02 ENCOUNTER — Ambulatory Visit (INDEPENDENT_AMBULATORY_CARE_PROVIDER_SITE_OTHER): Payer: 59 | Admitting: Family Medicine

## 2020-01-02 ENCOUNTER — Encounter: Payer: Self-pay | Admitting: Family Medicine

## 2020-01-02 ENCOUNTER — Other Ambulatory Visit: Payer: Self-pay

## 2020-01-02 VITALS — BP 124/86 | Ht 66.0 in | Wt 230.0 lb

## 2020-01-02 DIAGNOSIS — M545 Low back pain, unspecified: Secondary | ICD-10-CM

## 2020-01-02 DIAGNOSIS — G8929 Other chronic pain: Secondary | ICD-10-CM | POA: Diagnosis not present

## 2020-01-02 NOTE — Progress Notes (Signed)
PCP: Claiborne Rigg, NP  Subjective:   HPI: Patient is a 49 y.o. male here for low back pain.  Patient reports he's continued to struggle with pain in low back for several months. We've seen him for this issue dating back to September 2018 then again December 2019. He had an MRI 01/2017 showing bilateral foraminal narrowing at L4-5 bilaterally with disc bulge at L3-4 and protrusion L4-5 but these were to left side. He responded well to right sided facet injection at L4-5 level Current pain is more to right side of lumbar spine. No radiation into extremities. No bowel/bladder dysfunction. Dull ache in the area of pain. No numbness/tingling. Not taking any medications for this. Doing some home exercises.  Past Medical History:  Diagnosis Date  . Carpal tunnel syndrome   . Diabetes mellitus   . Diastolic heart failure (HCC)   . Hypertension   . Obesity   . Pulmonary embolism (HCC) 2013   He has a history of HTN.  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 A repeat scan done in 2017 did not demonstrate evidence of emboli. Neither study demonstrated any coronary calcifications.   . Shoulder pain, bilateral   . Sleep apnea    was fitted for mask and never got machine - too costly    Current Outpatient Medications on File Prior to Visit  Medication Sig Dispense Refill  . amLODipine (NORVASC) 10 MG tablet Take 1 tablet (10 mg total) by mouth daily. 90 tablet 3  . ASPIRIN LOW DOSE 81 MG EC tablet TAKE ONE TABLET BY MOUTH DAILY 90 tablet 1  . atorvastatin (LIPITOR) 20 MG tablet Take 1 tablet (20 mg total) by mouth daily. 90 tablet 1  . fluticasone (FLONASE) 50 MCG/ACT nasal spray Place 2 sprays into both nostrils daily. 16 g 6  . glucose blood test strip Use as instructed. Inject into the skin once daily. E11.65 200 each 12  . ibuprofen (ADVIL) 600 MG tablet Take 1 tablet (600 mg total) by mouth every 8 (eight) hours as needed (Take with food.). 30 tablet  1  . insulin glargine (LANTUS) 100 UNIT/ML Solostar Pen Inject 10 Units into the skin daily at 10 pm. 15 mL 11  . Insulin Pen Needle (B-D UF III MINI PEN NEEDLES) 31G X 5 MM MISC Use as instructed. Inject into the skin once nightly. 100 each 6  . JARDIANCE 25 MG TABS tablet Take 1 tablet (25 mg total) by mouth daily. 90 tablet 2  . Lancet Devices (ONE TOUCH DELICA LANCING DEV) MISC Use as instructed. Inject into the skin once daily 1 each prn  . linaGLIPtin-metFORMIN HCl (JENTADUETO) 2.08-998 MG TABS Take 1 tablet by mouth 2 (two) times daily. 180 tablet 0  . lisinopril (ZESTRIL) 20 MG tablet Take 1 tablet (20 mg total) by mouth daily. 90 tablet 1  . loratadine (CLARITIN REDITABS) 10 MG dissolvable tablet Take 1 tablet (10 mg total) by mouth daily. 30 tablet 3  . omeprazole (PRILOSEC) 20 MG capsule Take 1 capsule (20 mg total) by mouth daily as needed (heartburn). 90 capsule 3   No current facility-administered medications on file prior to visit.    Past Surgical History:  Procedure Laterality Date  . CARPAL TUNNEL RELEASE Right 12/04/2015   Procedure: RIGHT CARPAL TUNNEL RELEASE;  Surgeon: Betha Loa, MD;  Location: [facility];  Service: Orthopedics;  Laterality: Right;  RIGHT CARPAL TUNNEL RELEASE  . CARPAL TUNNEL RELEASE Right  12/2015   [facility]  . CARPAL TUNNEL RELEASE Left 04/01/2016   Procedure: LEFT CARPAL TUNNEL RELEASE;  Surgeon: Betha Loa, MD;  Location: [facility];  Service: Orthopedics;  Laterality: Left;  . right hand surgery    . ROTATOR CUFF REPAIR      No Known Allergies  Social History   Socioeconomic History  . Marital status: Married    Spouse name: Not on file  . Number of children: Not on file  . Years of education: 12+  . Highest education level: Not on file  Occupational History  . Occupation: real Insurance account manager  Tobacco Use  . Smoking status: Never Smoker  . Smokeless tobacco: Never Used  Vaping Use  . Vaping Use: Never used   Substance and Sexual Activity  . Alcohol use: No    Alcohol/week: 0.0 standard drinks  . Drug use: No  . Sexual activity: Yes  Other Topics Concern  . Not on file  Social History Narrative   Lives at home.   Right-handed.   Occasional caffeine use.   Tea every other day   Social Determinants of Health   Financial Resource Strain:   . Difficulty of Paying Living Expenses: Not on file  Food Insecurity:   . Worried About Programme researcher, broadcasting/film/video in the Last Year: Not on file  . Ran Out of Food in the Last Year: Not on file  Transportation Needs:   . Lack of Transportation (Medical): Not on file  . Lack of Transportation (Non-Medical): Not on file  Physical Activity:   . Days of Exercise per Week: Not on file  . Minutes of Exercise per Session: Not on file  Stress:   . Feeling of Stress : Not on file  Social Connections:   . Frequency of Communication with Friends and Family: Not on file  . Frequency of Social Gatherings with Friends and Family: Not on file  . Attends Religious Services: Not on file  . Active Member of Clubs or Organizations: Not on file  . Attends Banker Meetings: Not on file  . Marital Status: Not on file  Intimate Partner Violence:   . Fear of Current or Ex-Partner: Not on file  . Emotionally Abused: Not on file  . Physically Abused: Not on file  . Sexually Abused: Not on file    Family History  Problem Relation Age of Onset  . Hypertension Mother   . Kidney disease Father   . Hypertension Other   . Hyperlipidemia Other   . Diabetes Other     BP 124/86   Ht 5\' 6"  (1.676 m)   Wt 230 lb (104.3 kg)   BMI 37.12 kg/m   Review of Systems: See HPI above.     Objective:  Physical Exam:  Gen: NAD, comfortable in exam room  Back: No gross deformity, scoliosis. No paraspinal, SI TTP .  No midline or bony TTP. FROM with pain on extension > flexion. Strength LEs 5/5 all muscle groups.   Trace MSRs in patellar and achilles tendons,  equal bilaterally. Negative SLRs. Sensation intact to light touch bilaterally. Negative logroll bilateral hips Negative faber, fadir, and piriformis stretches.   Assessment & Plan:  1. Low back pain - right sided.  No red flags.  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  Not improving with home exercises.  We discussed options.  Given it's been 3 years since last  MRI and he would like to proceed with injection as well, will repeat his lumbar spine MRI.  In meantime tylenol, ibuprofen if needed.  Will call him with MRI results and next steps.

## 2020-01-02 NOTE — Patient Instructions (Signed)
We will go ahead with repeating your MRI of your lumbar spine. I will call you with those results and discuss next steps (how much progression there's been, if we should go ahead with repeat injection). Do home exercises you learned previously in physical therapy. Tylenol, ibuprofen only if needed.

## 2020-01-16 ENCOUNTER — Other Ambulatory Visit: Payer: Self-pay

## 2020-01-16 ENCOUNTER — Ambulatory Visit
Admission: RE | Admit: 2020-01-16 | Discharge: 2020-01-16 | Disposition: A | Discharge status: Home / Self Care | Payer: 59 | Source: Ambulatory Visit | Attending: Family Medicine | Admitting: Family Medicine

## 2020-01-16 DIAGNOSIS — G8929 Other chronic pain: Secondary | ICD-10-CM

## 2020-01-23 ENCOUNTER — Other Ambulatory Visit: Payer: Self-pay

## 2020-01-23 ENCOUNTER — Encounter: Payer: Self-pay | Admitting: Family Medicine

## 2020-01-23 ENCOUNTER — Ambulatory Visit (INDEPENDENT_AMBULATORY_CARE_PROVIDER_SITE_OTHER): Payer: 59 | Admitting: Family Medicine

## 2020-01-23 VITALS — BP 122/86 | Ht 66.0 in | Wt 230.0 lb

## 2020-01-23 DIAGNOSIS — M25562 Pain in left knee: Secondary | ICD-10-CM | POA: Diagnosis not present

## 2020-01-23 MED ORDER — METHYLPREDNISOLONE ACETATE 40 MG/ML IJ SUSP
40.0000 mg | Freq: Once | INTRAMUSCULAR | Status: AC
  Administered 2020-01-23: 40 mg via INTRA_ARTICULAR

## 2020-01-23 NOTE — Progress Notes (Signed)
PCP: Claiborne Rigg, NP  Subjective:   HPI: Patient is a 49 y.o. male here for left knee pain.  Patient reports his left knee really started hurting again about a week ago. Pain medial, can be sharp. Would like repeat steroid injection.  Past Medical History:  Diagnosis Date  . Carpal tunnel syndrome   . Diabetes mellitus   . Diastolic heart failure (HCC)   . Hypertension   . Obesity   . Pulmonary embolism (HCC) 2013   He has a history of HTN.  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 A repeat scan done in 2017 did not demonstrate evidence of emboli. Neither study demonstrated any coronary calcifications.   . Shoulder pain, bilateral   . Sleep apnea    was fitted for mask and never got machine - too costly    Current Outpatient Medications on File Prior to Visit  Medication Sig Dispense Refill  . amLODipine (NORVASC) 10 MG tablet Take 1 tablet (10 mg total) by mouth daily. 90 tablet 3  . ASPIRIN LOW DOSE 81 MG EC tablet TAKE ONE TABLET BY MOUTH DAILY 90 tablet 1  . atorvastatin (LIPITOR) 20 MG tablet Take 1 tablet (20 mg total) by mouth daily. 90 tablet 1  . fluticasone (FLONASE) 50 MCG/ACT nasal spray Place 2 sprays into both nostrils daily. 16 g 6  . glucose blood test strip Use as instructed. Inject into the skin once daily. E11.65 200 each 12  . ibuprofen (ADVIL) 600 MG tablet Take 1 tablet (600 mg total) by mouth every 8 (eight) hours as needed (Take with food.). 30 tablet 1  . insulin glargine (LANTUS) 100 UNIT/ML Solostar Pen Inject 10 Units into the skin daily at 10 pm. 15 mL 11  . Insulin Pen Needle (B-D UF III MINI PEN NEEDLES) 31G X 5 MM MISC Use as instructed. Inject into the skin once nightly. 100 each 6  . JARDIANCE 25 MG TABS tablet Take 1 tablet (25 mg total) by mouth daily. 90 tablet 2  . Lancet Devices (ONE TOUCH DELICA LANCING DEV) MISC Use as instructed. Inject into the skin once daily 1 each prn  . linaGLIPtin-metFORMIN HCl  (JENTADUETO) 2.08-998 MG TABS Take 1 tablet by mouth 2 (two) times daily. 180 tablet 0  . lisinopril (ZESTRIL) 20 MG tablet Take 1 tablet (20 mg total) by mouth daily. 90 tablet 1  . loratadine (CLARITIN REDITABS) 10 MG dissolvable tablet Take 1 tablet (10 mg total) by mouth daily. 30 tablet 3  . omeprazole (PRILOSEC) 20 MG capsule Take 1 capsule (20 mg total) by mouth daily as needed (heartburn). 90 capsule 3   No current facility-administered medications on file prior to visit.    Past Surgical History:  Procedure Laterality Date  . CARPAL TUNNEL RELEASE Right 12/04/2015   Procedure: RIGHT CARPAL TUNNEL RELEASE;  Surgeon: Betha Loa, MD;  Location: [facility];  Service: Orthopedics;  Laterality: Right;  RIGHT CARPAL TUNNEL RELEASE  . CARPAL TUNNEL RELEASE Right 12/2015   [facility]  . CARPAL TUNNEL RELEASE Left 04/01/2016   Procedure: LEFT CARPAL TUNNEL RELEASE;  Surgeon: Betha Loa, MD;  Location: [facility];  Service: Orthopedics;  Laterality: Left;  . right hand surgery    . ROTATOR CUFF REPAIR      No Known Allergies  Social History   Socioeconomic History  . Marital status: Married    Spouse name: Not on file  . Number of  children: Not on file  . Years of education: 12+  . Highest education level: Not on file  Occupational History  . Occupation: real Insurance account manager  Tobacco Use  . Smoking status: Never Smoker  . Smokeless tobacco: Never Used  Vaping Use  . Vaping Use: Never used  Substance and Sexual Activity  . Alcohol use: No    Alcohol/week: 0.0 standard drinks  . Drug use: No  . Sexual activity: Yes  Other Topics Concern  . Not on file  Social History Narrative   Lives at home.   Right-handed.   Occasional caffeine use.   Tea every other day   Social Determinants of Health   Financial Resource Strain:   . Difficulty of Paying Living Expenses: Not on file  Food Insecurity:   . Worried About Programme researcher, broadcasting/film/video in the Last Year:  Not on file  . Ran Out of Food in the Last Year: Not on file  Transportation Needs:   . Lack of Transportation (Medical): Not on file  . Lack of Transportation (Non-Medical): Not on file  Physical Activity:   . Days of Exercise per Week: Not on file  . Minutes of Exercise per Session: Not on file  Stress:   . Feeling of Stress : Not on file  Social Connections:   . Frequency of Communication with Friends and Family: Not on file  . Frequency of Social Gatherings with Friends and Family: Not on file  . Attends Religious Services: Not on file  . Active Member of Clubs or Organizations: Not on file  . Attends Banker Meetings: Not on file  . Marital Status: Not on file  Intimate Partner Violence:   . Fear of Current or Ex-Partner: Not on file  . Emotionally Abused: Not on file  . Physically Abused: Not on file  . Sexually Abused: Not on file    Family History  Problem Relation Age of Onset  . Hypertension Mother   . Kidney disease Father   . Hypertension Other   . Hyperlipidemia Other   . Diabetes Other     BP 122/86   Ht 5\' 6"  (1.676 m)   Wt 230 lb (104.3 kg)   BMI 37.12 kg/m   [facility] Adult Exercise 01/02/2020  Frequency of aerobic exercise (# of days/week) 5  Average time in minutes 45  Frequency of strengthening activities (# of days/week) 0    No flowsheet data found.  Review of Systems: See HPI above.     Objective:  Physical Exam:  Gen: NAD, comfortable in exam room  Left knee: No gross deformity, ecchymoses, effusion. TTP medial joint line. FROM. Negative ant/post drawers. Negative valgus/varus testing. Negative mcmurrays, apleys. NV intact distally.   Assessment & Plan:  1. Left knee pain - 2/2 mild arthritis.  Repeated steroid injection today.  Tylenol, topical medications, supplements, nsaids previously reviewed.  Quad strengthening.  F/u prn for this issue.  After informed written consent timeout was performed,  patient was seated on exam table. Left knee was prepped with alcohol swab and utilizing anteromedial approach, patient's left knee was injected intraarticularly with 3:1 bupivicaine: depomedrol. Patient tolerated the procedure well without immediate complications.

## 2020-01-25 ENCOUNTER — Other Ambulatory Visit: Payer: 59

## 2020-01-25 DIAGNOSIS — Z20822 Contact with and (suspected) exposure to covid-19: Secondary | ICD-10-CM

## 2020-01-26 ENCOUNTER — Other Ambulatory Visit: Payer: Self-pay

## 2020-01-26 ENCOUNTER — Ambulatory Visit (HOSPITAL_BASED_OUTPATIENT_CLINIC_OR_DEPARTMENT_OTHER)
Admission: RE | Admit: 2020-01-26 | Discharge: 2020-01-26 | Disposition: A | Discharge status: Home / Self Care | Payer: 59 | Source: Ambulatory Visit | Attending: Family Medicine | Admitting: Family Medicine

## 2020-01-26 DIAGNOSIS — M545 Low back pain, unspecified: Secondary | ICD-10-CM

## 2020-01-26 DIAGNOSIS — G8929 Other chronic pain: Secondary | ICD-10-CM | POA: Diagnosis present

## 2020-01-26 LAB — SARS-COV-2, NAA 2 DAY TAT

## 2020-01-26 LAB — NOVEL CORONAVIRUS, NAA: SARS-CoV-2, NAA: NOT DETECTED

## 2020-01-29 ENCOUNTER — Ambulatory Visit: Payer: 59 | Attending: Internal Medicine

## 2020-01-29 DIAGNOSIS — Z23 Encounter for immunization: Secondary | ICD-10-CM

## 2020-01-29 NOTE — Progress Notes (Signed)
   Covid-19 Vaccination Clinic  Name:  Scott [name]    MRN: [id number] DOB: Jul 25, 1970  01/29/2020  [name] was observed post Covid-19 immunization for 15 minutes without incident. He was provided with Vaccine Information Sheet and instruction to access the V-Safe system.   [name] was instructed to call 911 with any severe reactions post vaccine: [name] Difficulty breathing  . Swelling of face and throat  . A fast heartbeat  . A bad rash all over body  . Dizziness and weakness

## 2020-02-13 ENCOUNTER — Ambulatory Visit (INDEPENDENT_AMBULATORY_CARE_PROVIDER_SITE_OTHER): Payer: 59 | Admitting: Family Medicine

## 2020-02-13 ENCOUNTER — Other Ambulatory Visit: Payer: Self-pay

## 2020-02-13 VITALS — BP 124/88 | Ht 66.0 in | Wt 230.0 lb

## 2020-02-13 DIAGNOSIS — M545 Low back pain, unspecified: Secondary | ICD-10-CM

## 2020-02-13 DIAGNOSIS — M79672 Pain in left foot: Secondary | ICD-10-CM | POA: Diagnosis not present

## 2020-02-13 DIAGNOSIS — G8929 Other chronic pain: Secondary | ICD-10-CM

## 2020-02-13 NOTE — Patient Instructions (Signed)
You're looking for adhesive bunion pads or something similar that sticks to your heel but has the spur sitting in the hole of the pad. You may even need to double up the padding. We will refer you to the foot and ankle surgeon to talk about shaving this off - concerns would be recurrence and typically conservative measures are recommended for these. We will set you up with a facet injection on the right side at L4-5 - call me a week after this to let me know how you're doing.

## 2020-02-14 ENCOUNTER — Other Ambulatory Visit: Payer: Self-pay | Admitting: Family Medicine

## 2020-02-14 ENCOUNTER — Encounter: Payer: Self-pay | Admitting: Family Medicine

## 2020-02-14 DIAGNOSIS — G8929 Other chronic pain: Secondary | ICD-10-CM

## 2020-02-14 DIAGNOSIS — M545 Low back pain, unspecified: Secondary | ICD-10-CM

## 2020-02-14 NOTE — Progress Notes (Signed)
PCP: Claiborne Rigg, NP  Subjective:   HPI: Patient is a 49 y.o. male here for left heel pain, to review lumbar MRI results.  Patient reports his back pain continues more to the right side. MRI mildly worsened compared to last lumbar spine MRI from a few years ago - facet arthropathy with some progression compared to previous.  Did well with facet injection in past and he would like to repeat this.  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 He also reports posterior heel pain on left side for about 1.5 months with localized swelling. This is firm, worse with shoes. Has not found anything to help with this pain.  Past Medical History:  Diagnosis Date   Carpal tunnel syndrome    Diabetes mellitus    Diastolic heart failure (HCC)    Hypertension    Obesity    Pulmonary embolism (HCC) 2013   He has a history of HTN.  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 A repeat scan done in 2017 did not demonstrate evidence of emboli. Neither study demonstrated any coronary calcifications.    Shoulder pain, bilateral    Sleep apnea    was fitted for mask and never got machine - too costly    Current Outpatient Medications on File Prior to Visit  Medication Sig Dispense Refill   amLODipine (NORVASC) 10 MG tablet Take 1 tablet (10 mg total) by mouth daily. 90 tablet 3   ASPIRIN LOW DOSE 81 MG EC tablet TAKE ONE TABLET BY MOUTH DAILY 90 tablet 1   atorvastatin (LIPITOR) 20 MG tablet Take 1 tablet (20 mg total) by mouth daily. 90 tablet 1   fluticasone (FLONASE) 50 MCG/ACT nasal spray Place 2 sprays into both nostrils daily. 16 g 6   glucose blood test strip Use as instructed. Inject into the skin once daily. E11.65 200 each 12   ibuprofen (ADVIL) 600 MG tablet Take 1 tablet (600 mg total) by mouth every 8 (eight) hours as needed (Take with food.). 30 tablet 1   insulin glargine (LANTUS) 100  UNIT/ML Solostar Pen Inject 10 Units into the skin daily at 10 pm. 15 mL 11   Insulin Pen Needle (B-D UF III MINI PEN NEEDLES) 31G X 5 MM MISC Use as instructed. Inject into the skin once nightly. 100 each 6   JARDIANCE 25 MG TABS tablet Take 1 tablet (25 mg total) by mouth daily. 90 tablet 2   Lancet Devices (ONE TOUCH DELICA LANCING DEV) MISC Use as instructed. Inject into the skin once daily 1 each prn   linaGLIPtin-metFORMIN HCl (JENTADUETO) 2.08-998 MG TABS Take 1 tablet by mouth 2 (two) times daily. 180 tablet 0   lisinopril (ZESTRIL) 20 MG tablet Take 1 tablet (20 mg total) by mouth daily. 90 tablet 1   loratadine (CLARITIN REDITABS) 10 MG dissolvable tablet Take 1 tablet (10 mg total) by mouth daily. 30 tablet 3   omeprazole (PRILOSEC) 20 MG capsule Take 1 capsule (20 mg total) by mouth daily as needed (heartburn). 90 capsule 3   No current facility-administered medications on file prior to visit.    Past Surgical History:  Procedure Laterality Date   CARPAL TUNNEL RELEASE Right 12/04/2015   Procedure: RIGHT CARPAL TUNNEL RELEASE;  Surgeon: Betha Loa, MD;  Location: [facility];  Service: Orthopedics;  Laterality: Right;  RIGHT CARPAL TUNNEL RELEASE  CARPAL TUNNEL RELEASE Right 12/2015   [facility]   CARPAL TUNNEL RELEASE Left 04/01/2016   Procedure: LEFT CARPAL TUNNEL RELEASE;  Surgeon: Betha Loa, MD;  Location:  SURGERY CENTER;  Service: Orthopedics;  Laterality: Left;   right hand surgery     ROTATOR CUFF REPAIR      No Known Allergies  Social History   Socioeconomic History   Marital status: Married    Spouse name: Not on file   Number of children: Not on file   Years of education: 12+   Highest education level: Not on file  Occupational History   Occupation: real estate agent  Tobacco Use   Smoking status: Never Smoker   Smokeless tobacco: Never Used  Building services engineer Use: Never used  Substance and Sexual Activity    Alcohol use: No    Alcohol/week: 0.0 standard drinks   Drug use: No   Sexual activity: Yes  Other Topics Concern   Not on file  Social History Narrative   Lives at home.   Right-handed.   Occasional caffeine use.   Tea every other day   Social Determinants of Health   Financial Resource Strain:    Difficulty of Paying Living Expenses: Not on file  Food Insecurity:    Worried About Programme researcher, broadcasting/film/video in the Last Year: Not on file   [employer] of Food in the Last Year: Not on file  Transportation Needs:    Lack of Transportation (Medical): Not on file   Lack of Transportation (Non-Medical): Not on file  Physical Activity:    Days of Exercise per Week: Not on file   Minutes of Exercise per Session: Not on file  Stress:    Feeling of Stress : Not on file  Social Connections:    Frequency of Communication with Friends and Family: Not on file   Frequency of Social Gatherings with Friends and Family: Not on file   Attends Religious Services: Not on file   Active Member of Clubs or Organizations: Not on file   Attends Banker Meetings: Not on file   Marital Status: Not on file  Intimate Partner Violence:    Fear of Current or Ex-Partner: Not on file   Emotionally Abused: Not on file   Physically Abused: Not on file   Sexually Abused: Not on file    Family History  Problem Relation Age of Onset   Hypertension Mother    Kidney disease Father    Hypertension Other    Hyperlipidemia Other    Diabetes Other     BP 124/88    Ht 5\' 6"  (1.676 m)    Wt 230 lb (104.3 kg)    BMI 37.12 kg/m   [facility] 01/02/2020  Frequency of aerobic exercise (# of days/week) 5  Average time in minutes 45  Frequency of strengthening activities (# of days/week) 0    No flowsheet data found.  Review of Systems: See HPI above.     Objective:  Physical Exam:  Gen: NAD, comfortable in exam room  Left foot/ankle: Firm  prominence posterior superior heel about 1cm in diameter.  No bruising.  No other gross deformity, swelling FROM ankle with normal strength, no pain. TTP of prominence noted above.  No achilles, other tenderness. Negative ant drawer and talar tilt.   Negative calcaneal squeeze. Thompsons test negative. NV intact distally.   Limited MSK u/s left heel:  Area of prominence corresponds  to bony spur.  No cortical irregularity or edema overlying cortex.  Assessment & Plan:  1. Left heel pain - unusual appearance of a Haglund's deformity.  We discussed conservative measures to take pressure off of this.  Icing, otc medications.  He would like to discuss with surgeon about possible removal as well.    2. Low back pain - more to right side.  Consistent with flare of facet arthropathy.  Flexion based strengthening.  Will set him up for facet injection.

## 2020-02-14 NOTE — Addendum Note (Signed)
Addended by: Rutha Bouchard E on: 02/14/2020 09:28 AM   Modules accepted: Orders

## 2020-02-22 DIAGNOSIS — M7660 Achilles tendinitis, unspecified leg: Secondary | ICD-10-CM | POA: Insufficient documentation

## 2020-02-25 ENCOUNTER — Other Ambulatory Visit: Payer: Self-pay | Admitting: Family Medicine

## 2020-02-25 ENCOUNTER — Other Ambulatory Visit: Payer: Self-pay

## 2020-02-25 ENCOUNTER — Ambulatory Visit
Admission: RE | Admit: 2020-02-25 | Discharge: 2020-02-25 | Disposition: A | Discharge status: Home / Self Care | Payer: 59 | Source: Ambulatory Visit | Attending: Family Medicine | Admitting: Family Medicine

## 2020-02-25 ENCOUNTER — Other Ambulatory Visit: Payer: Self-pay | Admitting: Family

## 2020-02-25 DIAGNOSIS — M545 Low back pain, unspecified: Secondary | ICD-10-CM

## 2020-02-25 DIAGNOSIS — G8929 Other chronic pain: Secondary | ICD-10-CM

## 2020-02-25 MED ORDER — IOPAMIDOL (ISOVUE-M 200) INJECTION 41%
1.0000 mL | Freq: Once | INTRAMUSCULAR | Status: AC
  Administered 2020-02-25: 1 mL via EPIDURAL

## 2020-02-25 MED ORDER — METHYLPREDNISOLONE ACETATE 40 MG/ML INJ SUSP (RADIOLOG
120.0000 mg | Freq: Once | INTRAMUSCULAR | Status: AC
  Administered 2020-02-25: 120 mg via INTRA_ARTICULAR

## 2020-02-25 NOTE — Progress Notes (Signed)
Updated order per radiologist's request. Pt received bilateral L4/5 facet injections today.

## 2020-02-25 NOTE — Discharge Instructions (Signed)
Joint Injection Discharge Instructions  1. After your joint injection, use ice to the affected area for the next 24 hours as a temporary increase in pain is not uncommon for a day or two after your procedure.  2. Resume all medications unless otherwise instructed.  3. Common side effects of steroids include facial flushing or redness, restlessness or inability to sleep and an increase in your blood sugar if you are a diabetic.    4. Follow up with the ordering physician for post care.  5. If you have any of the following please call [phone]:      Temperature greater than 101     Pain, redness or swelling at the injection site   Thank you for visiting our office today.  

## 2020-03-07 ENCOUNTER — Ambulatory Visit: Payer: 59 | Admitting: Nurse Practitioner

## 2020-04-14 ENCOUNTER — Ambulatory Visit: Payer: 59 | Admitting: Nurse Practitioner

## 2020-04-21 ENCOUNTER — Other Ambulatory Visit: Payer: Self-pay

## 2020-04-21 ENCOUNTER — Encounter: Payer: Self-pay | Admitting: Family Medicine

## 2020-04-21 ENCOUNTER — Ambulatory Visit (INDEPENDENT_AMBULATORY_CARE_PROVIDER_SITE_OTHER): Payer: 59 | Admitting: Family Medicine

## 2020-04-21 VITALS — BP 126/86 | Ht 66.0 in | Wt 230.0 lb

## 2020-04-21 DIAGNOSIS — M79645 Pain in left finger(s): Secondary | ICD-10-CM

## 2020-04-21 DIAGNOSIS — G8929 Other chronic pain: Secondary | ICD-10-CM | POA: Diagnosis not present

## 2020-04-21 DIAGNOSIS — M25562 Pain in left knee: Secondary | ICD-10-CM

## 2020-04-21 MED ORDER — METHYLPREDNISOLONE ACETATE 40 MG/ML IJ SUSP
20.0000 mg | Freq: Once | INTRAMUSCULAR | Status: AC
  Administered 2020-04-21: 20 mg via INTRA_ARTICULAR

## 2020-04-21 MED ORDER — METHYLPREDNISOLONE ACETATE 40 MG/ML IJ SUSP
40.0000 mg | Freq: Once | INTRAMUSCULAR | Status: AC
  Administered 2020-04-21: 40 mg via INTRA_ARTICULAR

## 2020-04-21 NOTE — Patient Instructions (Signed)
You were given an injection for trigger thumb today. Call me if this starts to come back or doesn't improve and we'll refer you to a hand surgeon. We also injected your left knee.

## 2020-04-21 NOTE — Progress Notes (Signed)
    SUBJECTIVE:   CHIEF COMPLAINT / HPI:  Patient recently went to the hand surgeon who operated on his right wrist for carpal tunnel.  He mentioned to the physician the pain in his left thumb.  He states they obtained x-rays and told him that they would be able to inject it at next visit.  At the next visit, the patient states they did not inject but discussed possible sesamoid excision.  He decided to just come back to the sports medicine clinic where he has had it injected twice before.  Patient states that it is mostly pain in the MCP joint and does not "catch" that often.  Denies weakness, paresthesia.  Left knee: has had steroid injections in the past for knee pain.  For the past few days he has had increasing left knee pain.  He states this is how it feels at the beginning of each flareup of his knee pain in the past.  PERTINENT  PMH / PSH: back and knee pain  OBJECTIVE:   BP 126/86   Ht 5\' 6"  (1.676 m)   Wt 230 lb (104.3 kg)   BMI 37.12 kg/m   General: Alert, oriented.  No acute distress. MSK: Thumb: Small nodule palpated over A1 pulley.  Normal range of motion in the left thumb.  Some pain at the MCP joint with flexion extension of the thumb at IP joint.  Tenderness to palpation at the medial aspect of the MCP joint on the left hand.  Normal strength in the hands bilaterally.  No laxity with UCL testing at 1st MCP.  No tenderness 1st CMC, 1st dorsal compartment.  Negative finkelsteins, tinels.    Left knee: No crepitus.  No swelling.  FROM.  Tenderness to palpation of the lateral joint line of the left knee.  ASSESSMENT/PLAN:   Left knee pain Patient states he has been having 2 to 3 days now of worsening knee pain.  Exam was positive for tenderness to palpation over the left lateral joint space.  Patient desires a steroid injection today as these have worked for him in the past.  Discussed pros and cons of injection with patient.  After informed written consent timeout was  performed, patient was seated on exam table. Left knee was prepped with alcohol swab and utilizing anteromedial approach, patient's left knee was injected intraarticularly with 3:1 lidocaine: depomedrol. Patient tolerated the procedure well without immediate complications.  Chronic pain of left thumb Patient has discussed his chronic thumb pain with the hand surgeon who ordered x-rays and believed his pain to be due to arthritic changes vs catching on his sesamoid.  On review of radiographs (and ultrasound today) no evidence arthritis at IP or MCP.  Patient desired ultrasound guided steroid injection today followed by referral for second opinion if pain in his thumb returns.  After informed written consent timeout was performed, patient was seated on exam table  Left thumb was prepped with alcohol swab then utilizing ultrasound guidance, patient's left thumb flexor tendon sheath injected at level of A1 pulley with 0.5:0.38mL lidocaine:depomedrol.  Patient tolerated the procedure well without immediate complications. Minimal bleeding resolved with pressure.       4m, MD [facility]

## 2020-04-21 NOTE — Assessment & Plan Note (Signed)
Patient states he has been having 2 to 3 days now of worsening knee pain.  Exam was positive for tenderness to palpation over the left lateral joint space.  Patient desires a steroid injection today as these have worked for him in the past.  Discussed pros and cons of injection with patient.  After informed written consent timeout was performed, patient was seated on exam table. Left knee was prepped with alcohol swab and utilizing anteromedial approach, patient's left knee was injected intraarticularly with 3:1 lidocaine: depomedrol. Patient tolerated the procedure well without immediate complications.

## 2020-04-21 NOTE — Assessment & Plan Note (Signed)
Patient has discussed his chronic thumb pain with the hand surgeon who ordered x-rays and believed his pain to be due to arthritic changes.  Also discussed sesamoid as a possible cause of his snapping sensation.  Looked at patient's thumb with ultrasound today.  Did not see any significant arthritic changes ultrasound in the MCP joint which is where his pain is localized.  Patient desired ultrasound guided steroid injection today followed by referral for second opinion when pain in his thumb returns.  After informed written consent timeout was performed, patient was seated in chair in exam room.  Left thumb was prepped with alcohol swab then utilizing ultrasound guidance, patient's left MCP joint of the thumb was injected with 0.5:0.69mL lidocaine:depomedrol.  Patient tolerated the procedure well without immediate complications. Minimal bleeding resolved with pressure.

## 2020-04-23 ENCOUNTER — Ambulatory Visit: Payer: 59 | Attending: Nurse Practitioner | Admitting: Internal Medicine

## 2020-04-23 ENCOUNTER — Other Ambulatory Visit: Payer: Self-pay

## 2020-04-23 ENCOUNTER — Encounter: Payer: Self-pay | Admitting: Internal Medicine

## 2020-04-23 VITALS — BP 134/89 | HR 75 | Temp 98.2°F | Resp 16 | Wt 227.0 lb

## 2020-04-23 DIAGNOSIS — E7849 Other hyperlipidemia: Secondary | ICD-10-CM

## 2020-04-23 DIAGNOSIS — Z794 Long term (current) use of insulin: Secondary | ICD-10-CM | POA: Diagnosis not present

## 2020-04-23 DIAGNOSIS — E1165 Type 2 diabetes mellitus with hyperglycemia: Secondary | ICD-10-CM | POA: Diagnosis not present

## 2020-04-23 DIAGNOSIS — I10 Essential (primary) hypertension: Secondary | ICD-10-CM

## 2020-04-23 DIAGNOSIS — Z23 Encounter for immunization: Secondary | ICD-10-CM | POA: Diagnosis not present

## 2020-04-23 DIAGNOSIS — E785 Hyperlipidemia, unspecified: Secondary | ICD-10-CM | POA: Insufficient documentation

## 2020-04-23 LAB — POCT GLYCOSYLATED HEMOGLOBIN (HGB A1C): HbA1c, POC (controlled diabetic range): 7.3 % — AB (ref 0.0–7.0)

## 2020-04-23 MED ORDER — LISINOPRIL 20 MG PO TABS: 20.0000 mg | ORAL_TABLET | Freq: Every day | ORAL | 1 refills | Status: DC

## 2020-04-23 MED ORDER — JENTADUETO 2.5-1000 MG PO TABS: 1.0000 | ORAL_TABLET | Freq: Two times a day (BID) | ORAL | 0 refills | Status: DC

## 2020-04-23 MED ORDER — JARDIANCE 25 MG PO TABS: 25.0000 mg | ORAL_TABLET | Freq: Every day | ORAL | 2 refills | Status: DC

## 2020-04-23 MED ORDER — AMLODIPINE BESYLATE 10 MG PO TABS: 10.0000 mg | ORAL_TABLET | Freq: Every day | ORAL | 3 refills | Status: DC

## 2020-04-23 MED ORDER — GLUCOSE BLOOD VI STRP: ORAL_STRIP | 12 refills | Status: DC

## 2020-04-23 NOTE — Assessment & Plan Note (Signed)
Well controlled 

## 2020-04-23 NOTE — Assessment & Plan Note (Signed)
Reviewed labs with him.  A1c is adequately controlled.  I did have a long discussion with him about the possibilities of getting off all of his diabetic medications with aggressive weight loss, exercise and appropriate diet.  He is enthused about that possibility.

## 2020-04-23 NOTE — Progress Notes (Signed)
3 month DM f/u -CBG- 141 A1C 7.3  2nd dose of shingrix  DM- out of test strips. Tries to pay attention to diet but admits to "slipping up". No polyuria, no polyphagia.  Lipids- controlled  BP-  BP Readings from Last 3 Encounters:  04/23/20 134/89  04/21/20 126/86  02/25/20 (!) 146/91  he is tolerating meds.   Past Medical History:  Diagnosis Date  . Carpal tunnel syndrome   . Chronic migraine 01/23/2019  . Diabetes mellitus   . Diastolic heart failure (HCC)   . Hypertension   . Obesity   . Pulmonary embolism (HCC) 2013   He has a history of HTN.  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 A repeat scan done in 2017 did not demonstrate evidence of emboli. Neither study demonstrated any coronary calcifications.   . Shoulder pain, bilateral   . Sleep apnea    was fitted for mask and never got machine - too costly    Social History   Socioeconomic History  . Marital status: Married    Spouse name: Not on file  . Number of children: Not on file  . Years of education: 12+  . Highest education level: Not on file  Occupational History  . Occupation: real Insurance account manager  Tobacco Use  . Smoking status: Never Smoker  . Smokeless tobacco: Never Used  Vaping Use  . Vaping Use: Never used  Substance and Sexual Activity  . Alcohol use: No    Alcohol/week: 0.0 standard drinks  . Drug use: No  . Sexual activity: Yes  Other Topics Concern  . Not on file  Social History Narrative   Lives at home.   Right-handed.   Occasional caffeine use.   Tea every other day   Social Determinants of Health   Financial Resource Strain: Not on file  Food Insecurity: Not on file  Transportation Needs: Not on file  Physical Activity: Not on file  Stress: Not on file  Social Connections: Not on file  Intimate Partner Violence: Not on file    Past Surgical History:  Procedure Laterality Date  . CARPAL TUNNEL RELEASE Right 12/04/2015   Procedure: RIGHT CARPAL  TUNNEL RELEASE;  Surgeon: Betha Loa, MD;  Location: [facility];  Service: Orthopedics;  Laterality: Right;  RIGHT CARPAL TUNNEL RELEASE  . CARPAL TUNNEL RELEASE Right 12/2015   [facility]  . CARPAL TUNNEL RELEASE Left 04/01/2016   Procedure: LEFT CARPAL TUNNEL RELEASE;  Surgeon: Betha Loa, MD;  Location: [facility];  Service: Orthopedics;  Laterality: Left;  . right hand surgery    . ROTATOR CUFF REPAIR      Family History  Problem Relation Age of Onset  . Hypertension Mother   . Kidney disease Father   . Hypertension Other   . Hyperlipidemia Other   . Diabetes Other     No Known Allergies  Current Outpatient Medications on File Prior to Visit  Medication Sig Dispense Refill  . ASPIRIN LOW DOSE 81 MG EC tablet TAKE ONE TABLET BY MOUTH DAILY 90 tablet 1  . atorvastatin (LIPITOR) 20 MG tablet Take 1 tablet (20 mg total) by mouth daily. 90 tablet 1  . fluticasone (FLONASE) 50 MCG/ACT nasal spray Place 2 sprays into both nostrils daily. 16 g 6  . Insulin Pen Needle (B-D UF III MINI PEN NEEDLES) 31G X 5 MM MISC Use as instructed. Inject into the skin once nightly. 100 each 6  .  loratadine (CLARITIN REDITABS) 10 MG dissolvable tablet Take 1 tablet (10 mg total) by mouth daily. 30 tablet 3  . Lancet Devices (ONE TOUCH DELICA LANCING DEV) MISC Use as instructed. Inject into the skin once daily 1 each prn   No current facility-administered medications on file prior to visit.     patient denies chest pain, shortness of breath, orthopnea. Denies lower extremity edema, abdominal pain, change in appetite, change in bowel movements. Patient denies rashes, musculoskeletal complaints. No other specific complaints in a complete review of systems.   BP 134/89   Pulse 75   Temp 98.2 F (36.8 C) (Oral)   Resp 16   Wt 227 lb (103 kg)   SpO2 97%   BMI 36.64 kg/m   well-developed well-nourished male in no acute distress. HEENT exam atraumatic, normocephalic, neck  supple without jugular venous distention. Chest clear to auscultation cardiac exam S1-S2 are regular. Abdominal exam overweight with bowel sounds, soft and nontender. Extremities no edema. Neurologic exam is alert with a normal gait.  DM type 2 (diabetes mellitus, type 2) Reviewed labs with him.  A1c is adequately controlled.  I did have a long discussion with him about the possibilities of getting off all of his diabetic medications with aggressive weight loss, exercise and appropriate diet.  He is enthused about that possibility.  HTN (hypertension), benign I would like to see his blood pressure under better control.  I do not think it is worth additional medications at this time.  Again with aggressive weight loss and exercise I think his blood pressure will become closer to 120/80.  Hyperlipidemia Well controlled

## 2020-04-23 NOTE — Patient Instructions (Signed)
Continue current medications.  I think you could eliminate many medications IF you are able to lose some weight and consistently exercise. I think a reasonable initial weight loss goal is 10% of your current body weight.

## 2020-04-23 NOTE — Assessment & Plan Note (Signed)
I would like to see his blood pressure under better control.  I do not think it is worth additional medications at this time.  Again with aggressive weight loss and exercise I think his blood pressure will become closer to 120/80.

## 2020-04-24 LAB — HEPATIC FUNCTION PANEL
ALT: 40 IU/L (ref 0–44)
AST: 19 IU/L (ref 0–40)
Albumin: 4.9 g/dL (ref 4.0–5.0)
Alkaline Phosphatase: 52 IU/L (ref 44–121)
Bilirubin Total: 0.5 mg/dL (ref 0.0–1.2)
Bilirubin, Direct: 0.15 mg/dL (ref 0.00–0.40)
Total Protein: 7.5 g/dL (ref 6.0–8.5)

## 2020-04-24 LAB — LIPID PANEL
Chol/HDL Ratio: 3.7 ratio (ref 0.0–5.0)
Cholesterol, Total: 149 mg/dL (ref 100–199)
HDL: 40 mg/dL (ref >39)
LDL Chol Calc (NIH): 88 mg/dL (ref 0–99)
Triglycerides: 115 mg/dL (ref 0–149)
VLDL Cholesterol Cal: 21 mg/dL (ref 5–40)

## 2020-04-24 LAB — GLUCOSE, POCT (MANUAL RESULT ENTRY): POC Glucose: 149 mg/dl — AB (ref 70–99)

## 2020-04-30 ENCOUNTER — Other Ambulatory Visit: Payer: Self-pay | Admitting: Nurse Practitioner

## 2020-04-30 ENCOUNTER — Other Ambulatory Visit: Payer: 59

## 2020-04-30 ENCOUNTER — Other Ambulatory Visit: Payer: Self-pay

## 2020-04-30 DIAGNOSIS — E1165 Type 2 diabetes mellitus with hyperglycemia: Secondary | ICD-10-CM

## 2020-04-30 DIAGNOSIS — Z794 Long term (current) use of insulin: Secondary | ICD-10-CM

## 2020-04-30 DIAGNOSIS — Z20822 Contact with and (suspected) exposure to covid-19: Secondary | ICD-10-CM

## 2020-05-02 LAB — SARS-COV-2, NAA 2 DAY TAT

## 2020-05-02 LAB — NOVEL CORONAVIRUS, NAA: SARS-CoV-2, NAA: NOT DETECTED

## 2020-05-26 ENCOUNTER — Ambulatory Visit (INDEPENDENT_AMBULATORY_CARE_PROVIDER_SITE_OTHER): Payer: 59 | Admitting: Family Medicine

## 2020-05-26 ENCOUNTER — Ambulatory Visit: Payer: Self-pay

## 2020-05-26 ENCOUNTER — Other Ambulatory Visit: Payer: Self-pay

## 2020-05-26 VITALS — BP 120/86 | Ht 66.0 in | Wt 238.0 lb

## 2020-05-26 DIAGNOSIS — M722 Plantar fascial fibromatosis: Secondary | ICD-10-CM | POA: Diagnosis not present

## 2020-05-26 DIAGNOSIS — M545 Low back pain, unspecified: Secondary | ICD-10-CM | POA: Diagnosis not present

## 2020-05-26 MED ORDER — METHYLPREDNISOLONE ACETATE 40 MG/ML IJ SUSP
40.0000 mg | Freq: Once | INTRAMUSCULAR | Status: AC
  Administered 2020-05-26: 40 mg via INTRA_ARTICULAR

## 2020-05-26 NOTE — Patient Instructions (Signed)
You have plantar fasciitis Take tylenol and/or aleve as needed for pain  Wait at least 5 days before doing the plantar fascia exercises: Plantar fascia stretch for 20-30 seconds (do 3 of these) in morning Lowering/raise on a step exercises 3 x 10 once or twice a day - this is very important for long term recovery. Can add heel walks, toe walks forward and backward as well Ice heel for 15 minutes as needed. Avoid flat shoes/barefoot walking as much as possible. Arch straps have been shown to help with pain. Inserts are important (dr. Jari Sportsman active series, spencos, our green insoles, custom orthotics). Steroid injection is a consideration for short term pain relief if you are struggling - you were given this today. Physical therapy is also an option. Follow up with me in 6 weeks or as needed if you're doing well.

## 2020-05-26 NOTE — Progress Notes (Signed)
    SUBJECTIVE:   CHIEF COMPLAINT / HPI: General fu/R heel pain   Scott [name] is a 50 year old gentleman presenting for follow-up and evaluation of right heel pain.  Left thumb/left knee: Chronic pain in both previously seen in the office on 12/20 for these concerns.  He had an injection of his left thumb and knee at that time.  He reports since that time his knee has no longer been bothering him.  He occasionally will have some catching in his left thumb but it is minimally bothersome (felt to be 2/2 trigger thumb). However, he would like an additional evaluation to see if there is any surgical intervention that he may need to do in the future.   Right heel pain: He noticed this approximately 2 weeks ago without any increase in severity since onset.  Consistent with previous episodes of plantar fasciitis (has had bilateral episodes in the past).  He reports he previously had this area injected and had full resolution of symptoms.  Worse with weightbearing, not too bothersome at rest.  Has been wearing good soled shoes and has added heel support/cushion in them already.  No associated weakness, numbness/tingling, or proceeding injury/trauma.  PERTINENT  PMH / PSH: Elevated BMI 38, HFpEF, hypertension, T2DM well-controlled per report  OBJECTIVE:   BP 120/86   Ht 5\' 6"  (1.676 m)   Wt 238 lb (108 kg)   BMI 38.41 kg/m   General: Alert, NAD HEENT: NCAT, MMM Lungs: No increased WOB   Right Foot: Inspection:  No obvious bony deformity.  No swelling, erythema, or bruising.  Palpation: TTP over mid aspect of heel and over origin of plantar fascia at calcaneus. ROM: Full  ROM of the ankle. Normal midfoot flexibility Strength: 5/5 strength ankle in all planes Neurovascular: N/V intact distally in the lower extremity Special tests: Negative calcaneal squeeze test.   R Plantar fascia injection, ultrasound-guided:  After informed written consent timeout was performed, patient was seated on table in exam  room. Right heel was prepped with alcohol swab and utilizing medial approach with ultrasound guidance, patient's plantar fascia was injected with 2:1 lidocaine: depomedrol. Patient tolerated the procedure well without immediate complications.  ASSESSMENT/PLAN:   Right plantar fasciitis: Acute, recurrent.  Fortunately mild currently, proceed with CSI of plantar fascia today.  In addition, recommended fascial stretches and exercises to aid in long-term improvement.  Continue wearing appropriately soled shoes.  Ice, Tylenol PRN.   Left trigger thumb: Chronic. Continues to have intermittent catching.  Previously has had this area injected several times (~3) with continued symptoms and requesting evaluation by orthopedic surgery to discuss possible surgical interventions in the future.  Will place referral.  Follow-up in approximately 6 weeks or as needed if doing well.  , DO [facility]

## 2020-05-27 ENCOUNTER — Encounter: Payer: Self-pay | Admitting: Family Medicine

## 2020-05-27 ENCOUNTER — Other Ambulatory Visit: Payer: Self-pay | Admitting: Family Medicine

## 2020-05-27 DIAGNOSIS — M545 Low back pain, unspecified: Secondary | ICD-10-CM

## 2020-05-30 ENCOUNTER — Other Ambulatory Visit: Payer: Self-pay | Admitting: Nurse Practitioner

## 2020-05-30 DIAGNOSIS — Z760 Encounter for issue of repeat prescription: Secondary | ICD-10-CM

## 2020-06-03 ENCOUNTER — Ambulatory Visit
Admission: RE | Admit: 2020-06-03 | Discharge: 2020-06-03 | Disposition: A | Discharge status: Home / Self Care | Payer: 59 | Source: Ambulatory Visit | Attending: Family Medicine | Admitting: Family Medicine

## 2020-06-03 DIAGNOSIS — M545 Low back pain, unspecified: Secondary | ICD-10-CM

## 2020-06-03 MED ORDER — IOPAMIDOL (ISOVUE-M 200) INJECTION 41%
1.0000 mL | Freq: Once | INTRAMUSCULAR | Status: AC
  Administered 2020-06-03: 1 mL via INTRA_ARTICULAR

## 2020-06-03 MED ORDER — METHYLPREDNISOLONE ACETATE 40 MG/ML INJ SUSP (RADIOLOG
120.0000 mg | Freq: Once | INTRAMUSCULAR | Status: AC
  Administered 2020-06-03: 120 mg via INTRA_ARTICULAR

## 2020-06-03 NOTE — Discharge Instructions (Signed)
Joint Injection Discharge Instructions  1. After your joint injection, use ice to the affected area for the next 24 hours as a temporary increase in pain is not uncommon for a day or two after your procedure.  2. Resume all medications unless otherwise instructed.  3. Common side effects of steroids include facial flushing or redness, restlessness or inability to sleep and an increase in your blood sugar if you are a diabetic.    4. Follow up with the ordering physician for post care.  5. If you have any of the following please call [phone]:      Temperature greater than 101     Pain, redness or swelling at the injection site   Thank you for visiting our office today.  

## 2020-06-23 NOTE — Addendum Note (Signed)
Addended by: Annita Brod on: 06/23/2020 09:16 AM   Modules accepted: Orders

## 2020-06-30 ENCOUNTER — Other Ambulatory Visit: Payer: Self-pay | Admitting: Nurse Practitioner

## 2020-06-30 DIAGNOSIS — E782 Mixed hyperlipidemia: Secondary | ICD-10-CM

## 2020-07-03 DIAGNOSIS — M65311 Trigger thumb, right thumb: Secondary | ICD-10-CM | POA: Insufficient documentation

## 2020-07-07 ENCOUNTER — Ambulatory Visit: Payer: 59 | Admitting: Family Medicine

## 2020-07-09 ENCOUNTER — Ambulatory Visit: Payer: 59 | Admitting: Family Medicine

## 2020-07-18 ENCOUNTER — Telehealth: Payer: Self-pay | Admitting: Nurse Practitioner

## 2020-07-18 NOTE — Telephone Encounter (Signed)
Provider on [facility] 4/22. Called Pt no answer left VM for Pt to call [phone] to reschedule appt.

## 2020-07-21 DIAGNOSIS — M7730 Calcaneal spur, unspecified foot: Secondary | ICD-10-CM | POA: Insufficient documentation

## 2020-07-30 ENCOUNTER — Other Ambulatory Visit: Payer: Self-pay | Admitting: Internal Medicine

## 2020-07-30 DIAGNOSIS — E1165 Type 2 diabetes mellitus with hyperglycemia: Secondary | ICD-10-CM

## 2020-07-30 NOTE — Telephone Encounter (Signed)
Requested Prescriptions  Pending Prescriptions Disp Refills  . JENTADUETO 2.08-998 MG TABS [Pharmacy Med Name: JENTADUETO 2.5 MG-1000 MG TAB] 180 tablet 0    Sig: TAKE ONE TABLET BY MOUTH TWICE A DAY     Endocrinology:  Diabetes - Biguanide + DPP-4 Inhibitor Combos Passed - 07/30/2020  6:10 AM      Passed - HBA1C is between 0 and 7.9 and within 180 days    HbA1c, POC (controlled diabetic range)  Date Value Ref Range Status  04/23/2020 7.3 (A) 0.0 - 7.0 % Final         Passed - Cr in normal range and within 360 days    Creatinine  Date Value Ref Range Status  09/13/2016 1.1 0.7 - 1.3 mg/dL Final   Creatinine, Ser  Date Value Ref Range Status  12/05/2019 1.12 0.76 - 1.27 mg/dL Final   Creatinine, Urine  Date Value Ref Range Status  05/28/2016 179 20 - 370 mg/dL Final         Passed - AA eGFR in normal range and within 360 days    GFR, Est African American  Date Value Ref Range Status  05/28/2016 >89 >=60 mL/min Final   GFR calc Af Amer  Date Value Ref Range Status  12/05/2019 88 >59 mL/min/1.73 Final    Comment:    **[facility] currently reports eGFR in compliance with the current**   recommendations of the [employer]. [facility] will   update reporting as new guidelines are published from the NKF-ASN   Task force.    GFR, Est Non African American  Date Value Ref Range Status  05/28/2016 86 >=60 mL/min Final   GFR calc non Af Amer  Date Value Ref Range Status  12/05/2019 76 >59 mL/min/1.73 Final   EGFR  Date Value Ref Range Status  09/13/2016 >90 >90 ml/min/1.73 m2 Final    Comment:    eGFR is calculated using the CKD-EPI Creatinine Equation (2009)         Passed - Valid encounter within last 6 months    Recent Outpatient Visits          3 months ago Type 2 diabetes mellitus with hyperglycemia, with long-term current use of insulin (Nitro)   [place] Swords, Darrick Penna, MD   7 months ago Need for zoster vaccination    [place], Annie Main L, RPH-CPP   7 months ago Type 2 diabetes mellitus with hyperglycemia, with long-term current use of insulin [facility])   [place], [place] W, NP   1 year ago Type 2 diabetes mellitus with hyperglycemia, without long-term current use of insulin [facility]   [place], Vernia Buff, NP   1 year ago Diabetes mellitus type 2, uncontrolled, with complications [facility])   [place], Vernia Buff, NP

## 2020-08-05 ENCOUNTER — Other Ambulatory Visit: Payer: Self-pay

## 2020-08-05 ENCOUNTER — Other Ambulatory Visit (HOSPITAL_BASED_OUTPATIENT_CLINIC_OR_DEPARTMENT_OTHER): Payer: Self-pay

## 2020-08-05 ENCOUNTER — Ambulatory Visit: Payer: 59 | Attending: Internal Medicine

## 2020-08-05 DIAGNOSIS — Z23 Encounter for immunization: Secondary | ICD-10-CM

## 2020-08-05 MED ORDER — COVID-19 MRNA VACCINE (PFIZER) 30 MCG/0.3ML IM SUSP
INTRAMUSCULAR | 0 refills | Status: DC
  Filled 2020-08-05: qty 0.3, 1d supply, fill #0

## 2020-08-05 NOTE — Progress Notes (Signed)
   Covid-19 Vaccination Clinic  Name:  Scott [name]    MRN: [id number] DOB: [birth year]  08/05/2020  Scott [name] was observed post Covid-19 immunization for 15 minutes without incident. He was provided with Vaccine Information Sheet and instruction to access the V-Safe system.   Scott [name] was instructed to call 911 with any severe reactions post vaccine: [name] Difficulty breathing  . Swelling of face and throat  . A fast heartbeat  . A bad rash all over body  . Dizziness and weakness   Immunizations Administered    Name Date Dose VIS Date Route   PFIZER Comrnaty(Gray TOP) Covid-19 Vaccine 08/05/2020 10:18 AM 0.3 mL 04/10/2020 Intramuscular   Manufacturer: [employer], [employer]   Lot: [id number]   NDC: [phone]

## 2020-08-08 ENCOUNTER — Ambulatory Visit (INDEPENDENT_AMBULATORY_CARE_PROVIDER_SITE_OTHER): Payer: 59 | Admitting: Family Medicine

## 2020-08-08 ENCOUNTER — Other Ambulatory Visit: Payer: Self-pay

## 2020-08-08 ENCOUNTER — Other Ambulatory Visit (HOSPITAL_BASED_OUTPATIENT_CLINIC_OR_DEPARTMENT_OTHER): Payer: Self-pay

## 2020-08-08 VITALS — BP 124/88 | Ht 66.0 in | Wt 230.0 lb

## 2020-08-08 DIAGNOSIS — M79661 Pain in right lower leg: Secondary | ICD-10-CM

## 2020-08-08 NOTE — Progress Notes (Signed)
   PCP: Claiborne Rigg, NP  Subjective:   HPI: Patient is a 50 y.o. male here for right calf pain.  Right calf pain [name] reports that he has been having this right calf pain for about 1 week.  He reports a shooting or burning sensation along his right mid calf that lasts for several seconds.  He does not associate this discomfort with any particular activities.  It can happen at rest or with exertion.  He does not go to the gym but he does walk a fair amount for his work.  He has not had any recent changes in his level of activity.  He has previously had issues with plantar fasciitis bilaterally.  He was last seen in the sports medicine clinic on 1/24 for plantar fasciitis of the right foot and a left thumb trigger finger.  Since that visit, he had been doing his plantar fasciitis exercises regularly but ultimately got away from it because his foot pain totally improved.  Review of Systems:  Per HPI.   PMFSH, medications and smoking status reviewed.      Objective:  Physical Exam:  [facility] Adult Exercise 01/02/2020  Frequency of aerobic exercise (# of days/week) 5  Average time in minutes 45  Frequency of strengthening activities (# of days/week) 0     Gen: awake, alert, NAD, comfortable in exam room Pulm: breathing unlabored  .Ankle: - Inspection: No obvious deformity, erythema, swelling, or ecchymosis, ulcers, calluses, blisters.  No obvious skin changes or abnormalities or deformities to the gastroc or soleus muscles. - Palpation: No TTP at MT heads, no TTP at base of 5th MT, no TTP over cuboid, no tenderness over navicular prominence, no TTP over lateral or medial malleolus.  Mild tenderness with moderate palpation of the right medial gastroc. - Strength: Normal strength with dorsiflexion, plantarflexion. - ROM: Full ROM - Neuro/vasc: NV intact - Special Tests: Negative anterior drawer, normal inversion test.   - Gait: Normal gait with mild external  rotation of the hip at heel strike.     Assessment & Plan:  1.  Right calf pain History and physical are consistent with a soft tissue injury.  Based on his physical exam, he does have some apparent tenderness of the right medial gastroc.  With his mild external rotation in his gait, this may be related to a little bit of a muscle cramping and overuse related to the specific idiosyncrasy of his gait.  He has also recently been seen for plantar fasciitis.  He may be experiencing some muscular tightness and stretching as result of his known plantar fasciitis although, he does not seem to be having any significant discomfort related to his plantar fascia at this time.  For now, we will treat with stretching and strengthening exercises 2-3 times per day and topical aspartame cream. -Stretching and strengthening exercises provided -He was advised to apply aspercream OTC cream twice a day -Return to clinic in 2-3 weeks

## 2020-08-08 NOTE — Patient Instructions (Addendum)
Right calf pain: He seemed to have some tightness and cramping in your right calf.  This may be related to your gait which is not abnormal but does have its own idiosyncrasies.  I think the best intervention at this time would simply be stretching and strengthening exercises.  Recommend doing these exercises 2-3 times a day.  You can also put aspartame cream (over-the-counter medication) over that area and massage it twice a day.  Return to clinic in 2 weeks to assess for improvement.  If you are totally better, you can call and cancel your appointment

## 2020-08-08 NOTE — Progress Notes (Signed)
[facility] Attending Note: I have seen and examined this patient. I have discussed this patient with the resident and reviewed the assessment and plan as documented above. I agree with the resident's findings and plan. #1.  Right calf pain: This very definitely seems to be muscular as I can palpate a small chronic muscle spasm area that reproduces his pain exactly.  His gait is fairly normal although he has some out toeing.  He does a lot of walking and is active.  We will try him on eccentric calf strengthening to see if we can get some stretching of the muscles.  He has quite good muscle development.   There is nothing here that would concern me for DVT.  He has no history of trauma.  Like to see him back in follow-up as this is a bit puzzling.

## 2020-08-22 ENCOUNTER — Ambulatory Visit: Payer: 59 | Admitting: Nurse Practitioner

## 2020-09-10 ENCOUNTER — Other Ambulatory Visit: Payer: Self-pay

## 2020-09-10 ENCOUNTER — Ambulatory Visit (INDEPENDENT_AMBULATORY_CARE_PROVIDER_SITE_OTHER): Payer: 59 | Admitting: Family Medicine

## 2020-09-10 ENCOUNTER — Encounter: Payer: Self-pay | Admitting: Family Medicine

## 2020-09-10 VITALS — BP 150/98 | Ht 66.0 in | Wt 235.0 lb

## 2020-09-10 DIAGNOSIS — G8929 Other chronic pain: Secondary | ICD-10-CM

## 2020-09-10 DIAGNOSIS — M25562 Pain in left knee: Secondary | ICD-10-CM

## 2020-09-10 MED ORDER — METHYLPREDNISOLONE ACETATE 40 MG/ML IJ SUSP
40.0000 mg | Freq: Once | INTRAMUSCULAR | Status: AC
  Administered 2020-09-10: 40 mg via INTRA_ARTICULAR

## 2020-09-10 NOTE — Progress Notes (Signed)
PCP: Claiborne Rigg, NP  Subjective:   HPI: Patient is a 50 y.o. male here for left knee pain.  Patient reports recurrence of medial left knee pain past several days. No new injury or trauma. Has had radiographs about 3 years ago that were negative for fracture with minimal arthropathy. Injections have helped him in the past and he is interested in another one.  Past Medical History:  Diagnosis Date  . Carpal tunnel syndrome   . Chronic migraine 01/23/2019  . Diabetes mellitus   . Diastolic heart failure (HCC)   . Hypertension   . Obesity   . Pulmonary embolism (HCC) 2013   He has a history of HTN.  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 A repeat scan done in 2017 did not demonstrate evidence of emboli. Neither study demonstrated any coronary calcifications.   . Shoulder pain, bilateral   . Sleep apnea    was fitted for mask and never got machine - too costly    Current Outpatient Medications on File Prior to Visit  Medication Sig Dispense Refill  . amLODipine (NORVASC) 10 MG tablet Take 1 tablet (10 mg total) by mouth daily. 90 tablet 3  . ASPIRIN LOW DOSE 81 MG EC tablet TAKE ONE TABLET BY MOUTH DAILY 90 tablet 1  . atorvastatin (LIPITOR) 20 MG tablet TAKE ONE TABLET BY MOUTH DAILY 90 tablet 1  . COVID-19 mRNA vaccine, Pfizer, 30 MCG/0.3ML injection Inject into the muscle. 0.3 mL 0  . fluticasone (FLONASE) 50 MCG/ACT nasal spray Place 2 sprays into both nostrils daily. 16 g 6  . glucose blood test strip Use as instructed. Inject into the skin once daily. E11.65 200 each 12  . Insulin Pen Needle (B-D UF III MINI PEN NEEDLES) 31G X 5 MM MISC Use as instructed. Inject into the skin once nightly. 100 each 6  . JARDIANCE 25 MG TABS tablet Take 1 tablet (25 mg total) by mouth daily. 90 tablet 2  . JENTADUETO 2.08-998 MG TABS TAKE ONE TABLET BY MOUTH TWICE A DAY 180 tablet 0  . [employer] (ONE TOUCH DELICA LANCING DEV) MISC Use as instructed.  Inject into the skin once daily 1 each prn  . lisinopril (ZESTRIL) 20 MG tablet Take 1 tablet (20 mg total) by mouth daily. 90 tablet 1  . loratadine (CLARITIN REDITABS) 10 MG dissolvable tablet Take 1 tablet (10 mg total) by mouth daily. 30 tablet 3   No current facility-administered medications on file prior to visit.    Past Surgical History:  Procedure Laterality Date  . CARPAL TUNNEL RELEASE Right 12/04/2015   Procedure: RIGHT CARPAL TUNNEL RELEASE;  Surgeon: Betha Loa, MD;  Location: [facility];  Service: Orthopedics;  Laterality: Right;  RIGHT CARPAL TUNNEL RELEASE  . CARPAL TUNNEL RELEASE Right 12/2015   [facility]  . CARPAL TUNNEL RELEASE Left 04/01/2016   Procedure: LEFT CARPAL TUNNEL RELEASE;  Surgeon: Betha Loa, MD;  Location: [facility];  Service: Orthopedics;  Laterality: Left;  . right hand surgery    . ROTATOR CUFF REPAIR      No Known Allergies  Social History   Socioeconomic History  . Marital status: Married    Spouse name: Not on file  . Number of children: Not on file  . Years of education: 12+  . Highest education level: Not on file  Occupational History  . Occupation: real Insurance account manager  Tobacco Use  . Smoking status:  Never Smoker  . Smokeless tobacco: Never Used  Vaping Use  . Vaping Use: Never used  Substance and Sexual Activity  . Alcohol use: No    Alcohol/week: 0.0 standard drinks  . Drug use: No  . Sexual activity: Yes  Other Topics Concern  . Not on file  Social History Narrative   Lives at home.   Right-handed.   Occasional caffeine use.   Tea every other day   Social Determinants of Health   Financial Resource Strain: Not on file  Food Insecurity: Not on file  Transportation Needs: Not on file  Physical Activity: Not on file  Stress: Not on file  Social Connections: Not on file  Intimate Partner Violence: Not on file    Family History  Problem Relation Age of Onset  . Hypertension Mother   . Kidney  disease Father   . Hypertension Other   . Hyperlipidemia Other   . Diabetes Other     BP (!) 150/98   Ht 5\' 6"  (1.676 m)   Wt 235 lb (106.6 kg)   BMI 37.93 kg/m   [facility] Adult Exercise 01/02/2020  Frequency of aerobic exercise (# of days/week) 5  Average time in minutes 45  Frequency of strengthening activities (# of days/week) 0    No flowsheet data found.  Review of Systems: See HPI above.     Objective:  Physical Exam:  Gen: NAD, comfortable in exam room  Left knee: No gross deformity, ecchymoses, effusion. TTP medial joint lines. FROM with normal strength. Negative ant/post drawers. Negative valgus/varus testing. Negative lachman. Negative mcmurrays, apleys. NV intact distally.   Assessment & Plan:  1. Left knee pain - exam consistent with arthritis though radiographs from 2019 were reassuring.  Possible he has worse arthritis than seen on radiographs vs degenerative medial meniscus tear.  Injection repeated today.  Discussed consideration of MRI to further assess as well.  After informed written consent timeout was performed, patient was seated on exam table. Left knee was prepped with alcohol swab and utilizing anteromedial approach, patient's left knee was injected intraarticularly with 3:1 lidocaine: depomedrol. Patient tolerated the procedure well without immediate complications.

## 2020-09-26 ENCOUNTER — Other Ambulatory Visit: Payer: Self-pay | Admitting: Nurse Practitioner

## 2020-09-26 DIAGNOSIS — E782 Mixed hyperlipidemia: Secondary | ICD-10-CM

## 2020-10-02 ENCOUNTER — Telehealth: Payer: Self-pay | Admitting: Nurse Practitioner

## 2020-10-02 NOTE — Telephone Encounter (Signed)
Copied from CRM [id number]. Topic: General - Other >> Oct 01, 2020  3:39 PM Pawlus, Maxine Glenn A wrote: Reason for CRM: Pt stated that we should have received a fax regarding his blood work / A1Cs, please advise if you have received anything.   Did you receive any faxes regarding pt bloodwork?

## 2020-10-03 NOTE — Telephone Encounter (Signed)
I have not seen any paperwork in zelda box

## 2020-10-06 ENCOUNTER — Other Ambulatory Visit: Payer: Self-pay | Admitting: Nurse Practitioner

## 2020-10-06 DIAGNOSIS — Z8679 Personal history of other diseases of the circulatory system: Secondary | ICD-10-CM

## 2020-10-06 NOTE — Telephone Encounter (Signed)
Pt called in and stated he had a surgical clearance done and on his clearance sent back to Ortho pcp had advised that pt needs a clearance from a cardiologist / pt wants to know why he would need clearance from cardiology / please advise

## 2020-10-08 ENCOUNTER — Encounter (HOSPITAL_COMMUNITY): Payer: Self-pay | Admitting: Emergency Medicine

## 2020-10-08 ENCOUNTER — Ambulatory Visit (HOSPITAL_COMMUNITY)
Admission: EM | Admit: 2020-10-08 | Discharge: 2020-10-08 | Disposition: A | Discharge status: Home / Self Care | Payer: 59 | Attending: Medical Oncology | Admitting: Medical Oncology

## 2020-10-08 ENCOUNTER — Other Ambulatory Visit: Payer: Self-pay

## 2020-10-08 DIAGNOSIS — J069 Acute upper respiratory infection, unspecified: Secondary | ICD-10-CM | POA: Insufficient documentation

## 2020-10-08 DIAGNOSIS — Z20822 Contact with and (suspected) exposure to covid-19: Secondary | ICD-10-CM | POA: Diagnosis not present

## 2020-10-08 DIAGNOSIS — Z8249 Family history of ischemic heart disease and other diseases of the circulatory system: Secondary | ICD-10-CM | POA: Diagnosis not present

## 2020-10-08 DIAGNOSIS — Z86711 Personal history of pulmonary embolism: Secondary | ICD-10-CM | POA: Diagnosis not present

## 2020-10-08 DIAGNOSIS — Z7982 Long term (current) use of aspirin: Secondary | ICD-10-CM | POA: Insufficient documentation

## 2020-10-08 DIAGNOSIS — Z79899 Other long term (current) drug therapy: Secondary | ICD-10-CM | POA: Insufficient documentation

## 2020-10-08 LAB — SARS CORONAVIRUS 2 (TAT 6-24 HRS): SARS Coronavirus 2: NEGATIVE

## 2020-10-08 MED ORDER — FLUTICASONE PROPIONATE 50 MCG/ACT NA SUSP: 2.0000 | Freq: Every day | NASAL | 0 refills | Status: AC

## 2020-10-08 MED ORDER — BENZONATATE 100 MG PO CAPS: 100.0000 mg | ORAL_CAPSULE | Freq: Three times a day (TID) | ORAL | 0 refills | Status: DC

## 2020-10-08 NOTE — ED Provider Notes (Signed)
[facility]    CSN: [id number] Arrival date & time: 10/08/20  1319      History   Chief Complaint Chief Complaint  Patient presents with  . Nasal Congestion  . Cough    HPI Scott [name] is a 50 y.o. male.   HPI   Cold Symptoms: Patient reports that since Sunday he has had a dry cough, nasal congestion, ear fullness, runny nose, body aches and some fatigue.  He has tried various over-the-counter medications such as Mucinex with some improvement but not significant improvement.  He denies any known sick contacts.  He also denies any vomiting, fevers, shortness of breath, chest pain or hemoptysis.  Past Medical History:  Diagnosis Date  . Carpal tunnel syndrome   . Chronic migraine 01/23/2019  . Diabetes mellitus   . Diastolic heart failure (HCC)   . Hypertension   . Obesity   . Pulmonary embolism (HCC) 2013   He has a history of HTN.  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 A repeat scan done in 2017 did not demonstrate evidence of emboli. Neither study demonstrated any coronary calcifications.   . Shoulder pain, bilateral   . Sleep apnea    was fitted for mask and never got machine - too costly    Patient Active Problem List   Diagnosis Date Noted  . Hyperlipidemia 04/23/2020  . Class 2 severe obesity due to excess calories with serious comorbidity and body mass index (BMI) of 37.0 to 37.9 in adult (HCC) 02/13/2019  . Sleep apnea 01/23/2019  . Chronic diastolic heart failure (HCC) 11/07/2016  . OSA (obstructive sleep apnea) 04/18/2012  . 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 ? DVT of popliteal vein, right (non occlusive) 09/03/2011  . Pulmonary embolism (HCC) 09/02/2011  . DM type 2 (diabetes mellitus, type 2) (HCC) 09/02/2011  . HTN (hypertension), benign 09/02/2011    Past Surgical History:  Procedure Laterality Date  . CARPAL TUNNEL RELEASE  Right 12/04/2015   Procedure: RIGHT CARPAL TUNNEL RELEASE;  Surgeon: Betha Loa, MD;  Location: [facility];  Service: Orthopedics;  Laterality: Right;  RIGHT CARPAL TUNNEL RELEASE  . CARPAL TUNNEL RELEASE Right 12/2015   [facility]  . CARPAL TUNNEL RELEASE Left 04/01/2016   Procedure: LEFT CARPAL TUNNEL RELEASE;  Surgeon: Betha Loa, MD;  Location:  SUR[facility]ERY CENTER;  Service: Orthopedics;  Laterality: Left;  . right hand surgery    . ROTATOR CUFF REPAIR         Home Medications    Prior to Admission medications   Medication Sig Start Date End Date Taking? Authorizing Provider  amLODipine (NORVASC) 10 MG tablet Take 1 tablet (10 mg total) by mouth daily. 04/23/20   Lindley Magnus, MD  ASPIRIN LOW DOSE 81 MG EC tablet TAKE ONE TABLET BY MOUTH DAILY 05/30/20   Claiborne Rigg, NP  atorvastatin (LIPITOR) 20 MG tablet TAKE ONE TABLET BY MOUTH DAILY 06/30/20   Claiborne Rigg, NP  COVID-19 mRNA vaccine, Pfizer, 30 MCG/0.3ML injection Inject into the muscle. 08/05/20   Judyann Munson, MD  fluticasone (FLONASE) 50 MCG/ACT nasal spray Place 2 sprays into both nostrils daily. 12/05/19   Claiborne Rigg, NP  glucose blood test strip Use as instructed. Inject into the skin once daily. E11.65 04/23/20   Swords, Valetta Mole, MD  Insulin Pen Needle (B-D UF III MINI PEN NEEDLES) 31G X 5 MM  MISC Use as instructed. Inject into the skin once nightly. 05/16/19   Claiborne Rigg, NP  JARDIANCE 25 MG TABS tablet Take 1 tablet (25 mg total) by mouth daily. 04/23/20   Swords, Valetta Mole, MD  JENTADUETO 2.08-998 MG TABS TAKE ONE TABLET BY MOUTH TWICE A DAY 07/30/20   Claiborne Rigg, NP  Lancet Devices (ONE TOUCH DELICA LANCING DEV) MISC Use as instructed. Inject into the skin once daily 12/05/19   Claiborne Rigg, NP  lisinopril (ZESTRIL) 20 MG tablet Take 1 tablet (20 mg total) by mouth daily. 04/23/20 07/22/20  Swords, Valetta Mole, MD  loratadine (CLARITIN REDITABS) 10 MG dissolvable tablet Take 1 tablet  (10 mg total) by mouth daily. 12/13/18   Anders Simmonds, PA-C    Family History Family History  Problem Relation Age of Onset  . Hypertension Mother   . Kidney disease Father   . Hypertension Other   . Hyperlipidemia Other   . Diabetes Other     Social History Social History   Tobacco Use  . Smoking status: Never Smoker  . Smokeless tobacco: Never Used  Vaping Use  . Vaping Use: Never used  Substance Use Topics  . Alcohol use: No    Alcohol/week: 0.0 standard drinks  . Drug use: No     Allergies   Patient has no known allergies.   Review of Systems Review of Systems  As stated above in HPI Physical Exam Triage Vital Signs ED Triage Vitals  Enc Vitals Group     BP 10/08/20 1413 127/82     Pulse Rate 10/08/20 1413 91     Resp 10/08/20 1413 17     Temp 10/08/20 1413 98.5 F (36.9 C)     Temp Source 10/08/20 1413 Oral     SpO2 10/08/20 1413 96 %     Weight --      Height --      Head Circumference --      Peak Flow --      Pain Score 10/08/20 1411 0     Pain Loc --      Pain Edu? --      Excl. in GC? --    No data found.  Updated Vital Signs BP 127/82 (BP Location: Right Arm)   Pulse 91   Temp 98.5 F (36.9 C) (Oral)   Resp 17   SpO2 96%   Physical Exam Vitals and nursing note reviewed.  Constitutional:      General: He is not in acute distress.    Appearance: Normal appearance. He is not ill-appearing, toxic-appearing or diaphoretic.  HENT:     Head: Normocephalic and atraumatic.     Right Ear: Ear canal and external ear normal. There is impacted cerumen.     Left Ear: Ear canal and external ear normal.     Ears:     Comments: Middle ear effusion without bulging or erythema of the left ear    Nose: Congestion (scant) and rhinorrhea (scant clear) present.     Mouth/Throat:     Mouth: Mucous membranes are moist.     Pharynx: Oropharynx is clear. No oropharyngeal exudate or posterior oropharyngeal erythema.  Eyes:     Extraocular  Movements: Extraocular movements intact.     Pupils: Pupils are equal, round, and reactive to light.  Cardiovascular:     Rate and Rhythm: Normal rate and regular rhythm.     Heart sounds: Normal heart sounds.  Pulmonary:  Effort: Pulmonary effort is normal.     Breath sounds: Normal breath sounds.  Musculoskeletal:     Cervical back: Normal range of motion and neck supple.  Lymphadenopathy:     Cervical: No cervical adenopathy.  Skin:    General: Skin is warm.  Neurological:     Mental Status: He is alert and oriented to person, place, and time.  Psychiatric:        Mood and Affect: Mood normal.        Behavior: Behavior normal.      UC Treatments / Results  Labs (all labs ordered are listed, but only abnormal results are displayed) Labs Reviewed - No data to display  EKG   Radiology No results found.  Procedures Procedures (including critical care time)  Medications Ordered in UC Medications - No data to display  Initial Impression / Assessment and Plan / UC Course  I have reviewed the triage vital signs and the nursing notes.  Pertinent labs & imaging results that were available during my care of the patient were reviewed by me and considered in my medical decision making (see chart for details).     New.  Likely viral in nature which I discussed with patient.  Discussed that the typical course for viral illnesses that we have been seeing recently is about 10 to 14 days for patients.  Discussed red flag signs and symptoms along with medications that I am prescribing him today that should help with his symptoms. Discussed concurrent rest and hydration with water.  Final Clinical Impressions(s) / UC Diagnoses   Final diagnoses:  None   Discharge Instructions   None    ED Prescriptions    None     PDMP not reviewed this encounter.   Rushie Chestnut, [place] 10/08/20 1442

## 2020-10-08 NOTE — Discharge Instructions (Addendum)
If the Fluticasone spray is not fully helping your symptoms you can trial Afrin nasal spray for up to 3 days.

## 2020-10-08 NOTE — ED Triage Notes (Signed)
Pt is present today with nasal congestion, runny nose, body aches, and slight dry cough. Pt states that his sx started Sunday night

## 2020-10-13 ENCOUNTER — Ambulatory Visit: Payer: 59 | Admitting: Family Medicine

## 2020-10-28 ENCOUNTER — Other Ambulatory Visit: Payer: Self-pay | Admitting: Nurse Practitioner

## 2020-10-28 DIAGNOSIS — I10 Essential (primary) hypertension: Secondary | ICD-10-CM

## 2020-10-28 DIAGNOSIS — E782 Mixed hyperlipidemia: Secondary | ICD-10-CM

## 2020-10-28 NOTE — Telephone Encounter (Signed)
}  Notes to clinic: Patient has upcoming appt on 11/05/2020 Should have enough medication until appt    Requested Prescriptions  Pending Prescriptions Disp Refills   lisinopril (ZESTRIL) 20 MG tablet [Pharmacy Med Name: LISINOPRIL 20 MG TABLET] 30 tablet     Sig: TAKE ONE TABLET BY MOUTH DAILY      Cardiovascular:  ACE Inhibitors Failed - 10/28/2020  9:44 AM      Failed - Cr in normal range and within 180 days    Creatinine  Date Value Ref Range Status  09/13/2016 1.1 0.7 - 1.3 mg/dL Final   Creatinine, Ser  Date Value Ref Range Status  12/05/2019 1.12 0.76 - 1.27 mg/dL Final   Creatinine, Urine  Date Value Ref Range Status  05/28/2016 179 20 - 370 mg/dL Final          Failed - K in normal range and within 180 days    Potassium  Date Value Ref Range Status  12/05/2019 4.4 3.5 - 5.2 mmol/L Final  09/13/2016 4.7 3.5 - 5.1 mEq/L Final          Failed - Valid encounter within last 6 months    Recent Outpatient Visits           6 months ago Type 2 diabetes mellitus with hyperglycemia, with long-term current use of insulin (HCC)   [facility] [place], Valetta Mole, MD   10 months ago Need for zoster vaccination   [facility] [place], Cornelius Moras, RPH-CPP   10 months ago Type 2 diabetes mellitus with hyperglycemia, with long-term current use of insulin (HCC)   [facility] [place], Shea Stakes, NP   1 year ago Type 2 diabetes mellitus with hyperglycemia, without long-term current use of insulin (HCC)   [facility] [place], [place] W, NP   1 year ago Diabetes mellitus type 2, uncontrolled, with complications [facility])   [facility] [place], Shea Stakes, NP       Future Appointments             In 1 week Anders Simmonds, PA-C [place] [facility]   In 3 weeks Sag[place], [place], MD [facility] [place]    In 3 weeks Bing Matter, Marveen Reeks, MD [facility] - Patient is not pregnant      Passed - Last BP in normal range    BP Readings from Last 1 Encounters:  10/08/20 127/82            atorvastatin (LIPITOR) 20 MG tablet [Pharmacy Med Name: ATORVASTATIN 20 MG TABLET] 90 tablet 1    Sig: TAKE ONE TABLET BY MOUTH DAILY      Cardiovascular:  Antilipid - Statins Passed - 10/28/2020  9:44 AM      Passed - Total Cholesterol in normal range and within 360 days    Cholesterol, Total  Date Value Ref Range Status  04/23/2020 149 100 - 199 mg/dL Final          Passed - LDL in normal range and within 360 days    LDL Chol Calc (NIH)  Date Value Ref Range Status  04/23/2020 88 0 - 99 mg/dL Final          Passed - HDL in normal range and within 360 days  HDL  Date Value Ref Range Status  04/23/2020 40 >39 mg/dL Final          Passed - Triglycerides in normal range and within 360 days    Triglycerides  Date Value Ref Range Status  04/23/2020 115 0 - 149 mg/dL Final          Passed - Patient is not pregnant      Passed - Valid encounter within last 12 months    Recent Outpatient Visits           6 months ago Type 2 diabetes mellitus with hyperglycemia, with long-term current use of insulin (HCC)   [facility] [place], Valetta Mole, MD   10 months ago Need for zoster vaccination   [facility] [place], Jeannett Senior L, RPH-CPP   10 months ago Type 2 diabetes mellitus with hyperglycemia, with long-term current use of insulin [facility])   [facility] [place], [place] W, NP   1 year ago Type 2 diabetes mellitus with hyperglycemia, without long-term current use of insulin [facility])   [facility] [place], Shea Stakes, NP   1 year ago Diabetes mellitus type 2, uncontrolled, with complications [facility])   [facility] [place],  Shea Stakes, NP       Future Appointments             In 1 week [place], Marzella Schlein, PA-C [place] [facility]   In 3 weeks Sagardia, Eilleen Kempf, MD [facility] [place]   In 3 weeks Bing Matter, Marveen Reeks, MD [facility]

## 2020-11-05 ENCOUNTER — Encounter: Payer: Self-pay | Admitting: Physician Assistant

## 2020-11-05 ENCOUNTER — Ambulatory Visit: Payer: 59 | Admitting: Physician Assistant

## 2020-11-05 ENCOUNTER — Ambulatory Visit: Payer: 59 | Attending: Physician Assistant | Admitting: Physician Assistant

## 2020-11-05 ENCOUNTER — Other Ambulatory Visit: Payer: Self-pay

## 2020-11-05 VITALS — BP 132/86 | HR 86 | Temp 98.2°F | Resp 18

## 2020-11-05 DIAGNOSIS — I10 Essential (primary) hypertension: Secondary | ICD-10-CM

## 2020-11-05 DIAGNOSIS — E1165 Type 2 diabetes mellitus with hyperglycemia: Secondary | ICD-10-CM

## 2020-11-05 DIAGNOSIS — Z794 Long term (current) use of insulin: Secondary | ICD-10-CM

## 2020-11-05 DIAGNOSIS — E782 Mixed hyperlipidemia: Secondary | ICD-10-CM | POA: Diagnosis not present

## 2020-11-05 LAB — POCT GLYCOSYLATED HEMOGLOBIN (HGB A1C): Hemoglobin A1C: 8 % — AB (ref 4.0–5.6)

## 2020-11-05 LAB — GLUCOSE, POCT (MANUAL RESULT ENTRY): POC Glucose: 153 mg/dl — AB (ref 70–99)

## 2020-11-05 MED ORDER — GLUCOSE BLOOD VI STRP
ORAL_STRIP | 12 refills | Status: AC
Start: 2020-11-05 — End: ?

## 2020-11-05 MED ORDER — AMLODIPINE BESYLATE 10 MG PO TABS
10.0000 mg | ORAL_TABLET | Freq: Every day | ORAL | 0 refills | Status: DC
Start: 2020-11-05 — End: 2021-03-04

## 2020-11-05 MED ORDER — ONETOUCH DELICA LANCING DEV MISC: 99 refills | Status: DC

## 2020-11-05 MED ORDER — LISINOPRIL 20 MG PO TABS: 20.0000 mg | ORAL_TABLET | Freq: Every day | ORAL | 0 refills | Status: DC

## 2020-11-05 MED ORDER — METFORMIN HCL 500 MG PO TABS: 500.0000 mg | ORAL_TABLET | Freq: Two times a day (BID) | ORAL | 0 refills | Status: DC

## 2020-11-05 MED ORDER — BD PEN NEEDLE MINI U/F 31G X 5 MM MISC: 6 refills | Status: DC

## 2020-11-05 MED ORDER — ATORVASTATIN CALCIUM 20 MG PO TABS: 20.0000 mg | ORAL_TABLET | Freq: Every day | ORAL | 0 refills | Status: DC

## 2020-11-05 MED ORDER — JARDIANCE 25 MG PO TABS: 25.0000 mg | ORAL_TABLET | Freq: Every day | ORAL | 0 refills | Status: DC

## 2020-11-05 NOTE — Progress Notes (Signed)
Established Patient Office Visit  Subjective:  Patient ID: Scott [name], male    DOB: 1971-01-16  Age: 50 y.o. MRN: [id number]  CC: No chief complaint on file.   HPI Scott [name] presents for medication refills.  Reports that he is in the process of transferring to a new primary care provider.  States that he has not been taking his medication for diabetes as directed.  Reports that when he takes a full dose of the Jentadueto he feels nauseous, states the feeling takes a long time to pass.  Reports that he has only been taking half of a tablet approximately every other day.  Reports that he has not been checking his blood glucose levels at home.  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      Past Medical History:  Diagnosis Date   Carpal tunnel syndrome    Chronic migraine 01/23/2019   Diabetes mellitus    Diastolic heart failure ([place])    Hypertension    Obesity    Pulmonary embolism ([place]) 2013   He has a history of HTN.  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 A repeat scan done in 2017 did not demonstrate evidence of emboli. Neither study demonstrated any coronary calcifications.    Shoulder pain, bilateral    Sleep apnea    was fitted for mask and never got machine - too costly    Past Surgical History:  Procedure Laterality Date   CARPAL TUNNEL RELEASE Right 12/04/2015   Procedure: RIGHT CARPAL TUNNEL RELEASE;  Surgeon: Leanora Cover, MD;  Location: [facility];  Service: Orthopedics;  Laterality: Right;  RIGHT CARPAL TUNNEL RELEASE   CARPAL TUNNEL RELEASE Right 12/2015   [place]   CARPAL TUNNEL RELEASE Left 04/01/2016   Procedure: LEFT CARPAL TUNNEL RELEASE;  Surgeon: Leanora Cover, MD;  Location: [facility];  Service: Orthopedics;  Laterality: Left;   right hand surgery     ROTATOR  CUFF REPAIR      Family History  Problem Relation Age of Onset   Hypertension Mother    Kidney disease Father    Hypertension Other    Hyperlipidemia Other    Diabetes Other     Social History   Socioeconomic History   Marital status: Married    Spouse name: Not on file   Number of children: Not on file   Years of education: 12+   Highest education level: Not on file  Occupational History   Occupation: real estate agent  Tobacco Use   Smoking status: Never   Smokeless tobacco: Never  Vaping Use   Vaping Use: Never used  Substance and Sexual Activity   Alcohol use: No    Alcohol/week: 0.0 standard drinks   Drug use: No   Sexual activity: Yes  Other Topics Concern   Not on file  Social History Narrative   Lives at home.   Right-handed.   Occasional caffeine use.   Tea every other day   Social Determinants of Health   Financial Resource Strain: Not on file  Food Insecurity: Not on file  Transportation Needs: Not on file  Physical Activity: Not on file  Stress: Not on file  Social Connections: Not on file  Intimate Partner Violence: Not on file    Outpatient Medications Prior to Visit  Medication Sig Dispense Refill   ASPIRIN LOW DOSE 81 MG EC tablet TAKE ONE TABLET BY MOUTH DAILY 90 tablet 1   benzonatate (TESSALON) 100 MG capsule Take 1 capsule (100 mg total) by mouth every 8 (eight) hours. 21 capsule 0   COVID-19 mRNA vaccine, Pfizer, 30 MCG/0.3ML injection Inject into the muscle. 0.3 mL 0   fluticasone (FLONASE) 50 MCG/ACT nasal spray Place 2 sprays into both nostrils daily. 16 mL 0   loratadine (CLARITIN REDITABS) 10 MG dissolvable tablet Take 1 tablet (10 mg total) by mouth daily. 30 tablet 3   amLODipine (NORVASC) 10 MG tablet Take 1 tablet (10 mg total) by mouth daily. 90 tablet 3   atorvastatin (LIPITOR) 20 MG tablet TAKE ONE TABLET BY MOUTH DAILY 30 tablet 0   glucose blood test strip Use as instructed. Inject into the skin once daily. E11.65 200 each  12   Insulin Pen Needle (B-D UF III MINI PEN NEEDLES) 31G X 5 MM MISC Use as instructed. Inject into the skin once nightly. 100 each 6   JARDIANCE 25 MG TABS tablet Take 1 tablet (25 mg total) by mouth daily. 90 tablet 2   JENTADUETO 2.08-998 MG TABS TAKE ONE TABLET BY MOUTH TWICE A DAY 180 tablet 0   Lancet Devices (ONE TOUCH DELICA LANCING DEV) MISC Use as instructed. Inject into the skin once daily 1 each prn   lisinopril (ZESTRIL) 20 MG tablet TAKE ONE TABLET BY MOUTH DAILY 30 tablet 0   No facility-administered medications prior to visit.    No Known Allergies  ROS Review of Systems  Constitutional: Negative.   HENT: Negative.    Eyes: Negative.   Respiratory: Negative.    Cardiovascular: Negative.   Gastrointestinal: Negative.   Endocrine: Negative.   Genitourinary: Negative.   Musculoskeletal: Negative.   Skin: Negative.   Allergic/Immunologic: Negative.   Neurological: Negative.   Hematological: Negative.   Psychiatric/Behavioral: Negative.       Objective:    Physical Exam Vitals and nursing note reviewed.  Constitutional:      Appearance: Normal appearance.  HENT:     Head: Normocephalic and atraumatic.     Right Ear: External ear normal.     Left Ear: External ear normal.     Nose: Nose normal.     Mouth/Throat:     Mouth: Mucous membranes are moist.     Pharynx: Oropharynx is clear.  Eyes:     Extraocular Movements: Extraocular movements intact.     Conjunctiva/sclera: Conjunctivae normal.     Pupils: Pupils are equal, round, and reactive to light.  Cardiovascular:     Rate and Rhythm: Normal rate and regular rhythm.     Pulses: Normal pulses.     Heart sounds: Normal heart sounds.  Pulmonary:     Effort: Pulmonary effort is normal.     Breath sounds: Normal breath sounds.  Musculoskeletal:        General: Normal range of motion.     Cervical back: Normal range of motion and neck supple.  Skin:    General: Skin is warm and dry.  Neurological:      General: No focal deficit present.     Mental Status: He is alert and oriented to person, place, and time.  Psychiatric:        Mood and Affect: Mood normal.        Behavior: Behavior normal.        Thought Content: Thought content normal.  Judgment: Judgment normal.    There were no vitals taken for this visit. Wt Readings from Last 3 Encounters:  09/10/20 235 lb (106.6 kg)  08/08/20 230 lb (104.3 kg)  05/26/20 238 lb (108 kg)     Health Maintenance Due  Topic Date Due   Hepatitis C Screening  Never done   FOOT EXAM  05/28/2017    There are no preventive care reminders to display for this patient.  Lab Results  Component Value Date   TSH 1.085 01/15/2014   Lab Results  Component Value Date   WBC 5.1 12/05/2019   HGB 16.4 12/05/2019   HCT 50.0 12/05/2019   MCV 77 (L) 12/05/2019   PLT 261 12/05/2019   Lab Results  Component Value Date   NA 140 12/05/2019   K 4.4 12/05/2019   CHLORIDE 105 09/13/2016   CO2 21 12/05/2019   GLUCOSE 146 (H) 12/05/2019   BUN 14 12/05/2019   CREATININE 1.12 12/05/2019   BILITOT 0.5 04/23/2020   ALKPHOS 52 04/23/2020   AST 19 04/23/2020   ALT 40 04/23/2020   PROT 7.5 04/23/2020   ALBUMIN 4.9 04/23/2020   CALCIUM 9.5 12/05/2019   ANIONGAP 9 09/13/2016   EGFR >90 09/13/2016   Lab Results  Component Value Date   CHOL 149 04/23/2020   Lab Results  Component Value Date   HDL 40 04/23/2020   Lab Results  Component Value Date   LDLCALC 88 04/23/2020   Lab Results  Component Value Date   TRIG 115 04/23/2020   Lab Results  Component Value Date   CHOLHDL 3.7 04/23/2020   Lab Results  Component Value Date   HGBA1C 8.0 (A) 11/05/2020      Assessment & Plan:   Problem List Items Addressed This Visit       Endocrine   DM type 2 (diabetes mellitus, type 2) (HCC) - Primary (Chronic)   Relevant Medications   atorvastatin (LIPITOR) 20 MG tablet   JARDIANCE 25 MG TABS tablet   Insulin Pen Needle (B-D UF III MINI  PEN NEEDLES) 31G X 5 MM MISC   Lancet Devices (ONE TOUCH DELICA LANCING DEV) MISC   lisinopril (ZESTRIL) 20 MG tablet   glucose blood test strip   metFORMIN (GLUCOPHAGE) 500 MG tablet   Other Relevant Orders   HgB A1c (Completed)   Glucose (CBG) (Completed)     Other   Hyperlipidemia   Relevant Medications   amLODipine (NORVASC) 10 MG tablet   atorvastatin (LIPITOR) 20 MG tablet   lisinopril (ZESTRIL) 20 MG tablet   Other Visit Diagnoses     Essential hypertension       Relevant Medications   amLODipine (NORVASC) 10 MG tablet   atorvastatin (LIPITOR) 20 MG tablet   lisinopril (ZESTRIL) 20 MG tablet       Meds ordered this encounter  Medications   amLODipine (NORVASC) 10 MG tablet    Sig: Take 1 tablet (10 mg total) by mouth daily.    Dispense:  90 tablet    Refill:  0    Order Specific Question:   Supervising Provider    Answer:   Asencion Noble E [[id number]]   atorvastatin (LIPITOR) 20 MG tablet    Sig: Take 1 tablet (20 mg total) by mouth daily.    Dispense:  30 tablet    Refill:  0    Order Specific Question:   Supervising Provider    Answer:   Noralyn Pick  JARDIANCE 25 MG TABS tablet    Sig: Take 1 tablet (25 mg total) by mouth daily.    Dispense:  30 tablet    Refill:  0    Order Specific Question:   Supervising Provider    Answer:   Asencion Noble E [[id number]]   Insulin Pen Needle (B-D UF III MINI PEN NEEDLES) 31G X 5 MM MISC    Sig: Use as instructed. Inject into the skin once nightly.    Dispense:  100 each    Refill:  6    Order Specific Question:   Supervising Provider    Answer:   Elsie Stain [[id number]]   Lancet Devices (ONE TOUCH DELICA LANCING DEV) MISC    Sig: Use as instructed. Inject into the skin once daily    Dispense:  1 each    Refill:  prn    Order Specific Question:   Supervising Provider    Answer:   Asencion Noble E [[id number]]   lisinopril (ZESTRIL) 20 MG tablet    Sig: Take 1 tablet (20 mg total) by mouth daily.    Dispense:   30 tablet    Refill:  0    Order Specific Question:   Supervising Provider    Answer:   Joya Gaskins, PATRICK E [[id number]]   glucose blood test strip    Sig: Use as instructed. Inject into the skin once daily. E11.65    Dispense:  200 each    Refill:  12    Order Specific Question:   Supervising Provider    Answer:   Asencion Noble E [[id number]]   metFORMIN (GLUCOPHAGE) 500 MG tablet    Sig: Take 1 tablet (500 mg total) by mouth 2 (two) times daily with a meal.    Dispense:  60 tablet    Refill:  0    Stop Jentadueto    Order Specific Question:   Supervising Provider    Answer:   Asencion Noble E [[id number]]   1. Type 2 diabetes mellitus with hyperglycemia, with long-term current use of insulin (HCC) Patient agreeable to continue Jardiance, trial metformin 500 mg twice a day.  Patient education given on Mediterranean style diet.  Patient encouraged to check blood glucose levels at home, keep a written log and have available for all office visits. - HgB A1c - Glucose (CBG) - JARDIANCE 25 MG TABS tablet; Take 1 tablet (25 mg total) by mouth daily.  Dispense: 30 tablet; Refill: 0 - Insulin Pen Needle (B-D UF III MINI PEN NEEDLES) 31G X 5 MM MISC; Use as instructed. Inject into the skin once nightly.  Dispense: 100 each; Refill: 6 - Lancet Devices (ONE TOUCH DELICA LANCING DEV) MISC; Use as instructed. Inject into the skin once daily  Dispense: 1 each; Refill: prn - glucose blood test strip; Use as instructed. Inject into the skin once daily. E11.65  Dispense: 200 each; Refill: 12 - metFORMIN (GLUCOPHAGE) 500 MG tablet; Take 1 tablet (500 mg total) by mouth 2 (two) times daily with a meal.  Dispense: 60 tablet; Refill: 0  2. Essential hypertension Continue current regimen.  Patient encouraged to check blood pressure at home, keep a written log and have available for all office visits.  Red flags given for prompt reevaluation - amLODipine (NORVASC) 10 MG tablet; Take 1 tablet (10 mg total) by mouth daily.   Dispense: 90 tablet; Refill: 0 - lisinopril (ZESTRIL) 20 MG tablet; Take 1 tablet (20 mg total) by mouth daily.  Dispense: 30  tablet; Refill: 0  3. Mixed hyperlipidemia Continue current regimen - atorvastatin (LIPITOR) 20 MG tablet; Take 1 tablet (20 mg total) by mouth daily.  Dispense: 30 tablet; Refill: 0  I have reviewed the patient's medical history (PMH, PSH, Social History, Family History, Medications, and allergies) , and have been updated if relevant. I spent 32 minutes reviewing chart and  face to face time with patient.     Follow-up: Return if symptoms worsen or fail to improve.    Loraine Grip Mayers, PA-C

## 2020-11-05 NOTE — Progress Notes (Signed)
Patient has eaten and taken medication today. Patient denies pain at this time. Patient reports abdominal pain when taking jentadueto for the past year after eating light meals. Patient request a refill on Claritin.

## 2020-11-05 NOTE — Patient Instructions (Signed)
You will start taking metformin 500 mg twice a day instead of the Jentadueto.  I encourage you to check your blood sugar levels at least once a day, keep a written log and have available for all office visits.  Please let us know if there is anything else we can do for you.  Roney Jaffe, PA-C Physician Assistant [facility] [email or web address]   Mediterranean Diet A Mediterranean diet refers to food and lifestyle choices that are based on the traditions of countries located on the [place] Sea. This way of eating has been shown to help prevent certain conditions and improve outcomes forpeople who have chronic diseases, like kidney disease and heart disease. What are tips for following this plan? Lifestyle Cook and eat meals together with your family, when possible. Drink enough fluid to keep your urine clear or pale yellow. Be physically active every day. This includes: Aerobic exercise like running or swimming. Leisure activities like gardening, walking, or housework. Get 7-8 hours of sleep each night. If recommended by your health care provider, drink red wine in moderation. This means 1 glass a day for nonpregnant women and 2 glasses a day for men. A glass of wine equals 5 oz (150 mL). Reading food labels  Check the serving size of packaged foods. For foods such as rice and pasta, the serving size refers to the amount of cooked product, not dry. Check the total fat in packaged foods. Avoid foods that have saturated fat or trans fats. Check the ingredients list for added sugars, such as corn syrup.  Shopping At the grocery store, buy most of your food from the areas near the walls of the store. This includes: Fresh fruits and vegetables (produce). Grains, beans, nuts, and seeds. Some of these may be available in unpackaged forms or large amounts (in bulk). Fresh seafood. Poultry and eggs. Low-fat dairy products. Buy  whole ingredients instead of prepackaged foods. Buy fresh fruits and vegetables in-season from local farmers markets. Buy frozen fruits and vegetables in resealable bags. If you do not have access to quality fresh seafood, buy precooked frozen shrimp or canned fish, such as tuna, salmon, or sardines. Buy small amounts of raw or cooked vegetables, salads, or olives from the deli or salad bar at your store. Stock your pantry so you always have certain foods on hand, such as olive oil, canned tuna, canned tomatoes, rice, pasta, and beans. Cooking Cook foods with extra-virgin olive oil instead of using butter or other vegetable oils. Have meat as a side dish, and have vegetables or grains as your main dish. This means having meat in small portions or adding small amounts of meat to foods like pasta or stew. Use beans or vegetables instead of meat in common dishes like chili or lasagna. Experiment with different cooking methods. Try roasting or broiling vegetables instead of steaming or sauteing them. Add frozen vegetables to soups, stews, pasta, or rice. Add nuts or seeds for added healthy fat at each meal. You can add these to yogurt, salads, or vegetable dishes. Marinate fish or vegetables using olive oil, lemon juice, garlic, and fresh herbs. Meal planning  Plan to eat 1 vegetarian meal one day each week. Try to work up to 2 vegetarian meals, if possible. Eat seafood 2 or more times a week. Have healthy snacks readily available, such as: Vegetable sticks with hummus. Greek yogurt. Fruit and nut trail mix. Eat balanced meals throughout the week. This includes: Fruit: 2-3 servings a day Vegetables:  4-5 servings a day Low-fat dairy: 2 servings a day Fish, poultry, or lean meat: 1 serving a day Beans and legumes: 2 or more servings a week Nuts and seeds: 1-2 servings a day Whole grains: 6-8 servings a day Extra-virgin olive oil: 3-4 servings a day Limit red meat and sweets to only a few  servings a month  What are my food choices? Mediterranean diet Recommended Grains: Whole-grain pasta. Brown rice. Bulgar wheat. Polenta. Couscous. Whole-wheat bread. Orpah Cobb. Vegetables: Artichokes. Beets. Broccoli. Cabbage. Carrots. Eggplant. Green beans. Chard. Kale. Spinach. Onions. Leeks. Peas. Squash. Tomatoes. Peppers. Radishes. Fruits: Apples. Apricots. Avocado. Berries. Bananas. Cherries. Dates. Figs. Grapes. Lemons. Melon. Oranges. Peaches. Plums. Pomegranate. Meats and other protein foods: Beans. Almonds. Sunflower seeds. Pine nuts. Peanuts. Cod. Salmon. Scallops. Shrimp. Tuna. Tilapia. Clams. Oysters. Eggs. Dairy: Low-fat milk. Cheese. Greek yogurt. Beverages: Water. Red wine. Herbal tea. Fats and oils: Extra virgin olive oil. Avocado oil. Grape seed oil. Sweets and desserts: [place] yogurt with honey. Baked apples. Poached pears. Trail mix. Seasoning and other foods: Basil. Cilantro. Coriander. Cumin. Mint. Parsley. Sage. Rosemary. Tarragon. Garlic. Oregano. Thyme. Pepper. Balsalmic vinegar. Tahini. Hummus. Tomato sauce. Olives. Mushrooms. Limit these Grains: Prepackaged pasta or rice dishes. Prepackaged cereal with added sugar. Vegetables: Deep fried potatoes (french fries). Fruits: Fruit canned in syrup. Meats and other protein foods: Beef. Pork. Lamb. Poultry with skin. Hot dogs. Tomasa Blase. Dairy: Ice cream. Sour cream. Whole milk. Beverages: Juice. Sugar-sweetened soft drinks. Beer. Liquor and spirits. Fats and oils: Butter. Canola oil. Vegetable oil. Beef fat (tallow). Lard. Sweets and desserts: Cookies. Cakes. Pies. Candy. Seasoning and other foods: Mayonnaise. Premade sauces and marinades. The items listed may not be a complete list. Talk with your dietitian aboutwhat dietary choices are right for you. Summary The Mediterranean diet includes both food and lifestyle choices. Eat a variety of fresh fruits and vegetables, beans, nuts, seeds, and whole grains. Limit the  amount of red meat and sweets that you eat. Talk with your health care provider about whether it is safe for you to drink red wine in moderation. This means 1 glass a day for nonpregnant women and 2 glasses a day for men. A glass of wine equals 5 oz (150 mL). This information is not intended to replace advice given to you by your health care provider. Make sure you discuss any questions you have with your healthcare provider. Document Revised: 12/18/2015 Document Reviewed: 12/11/2015 Elsevier Patient Education  2020 [employer].

## 2020-11-10 ENCOUNTER — Encounter: Payer: Self-pay | Admitting: Physician Assistant

## 2020-11-20 ENCOUNTER — Ambulatory Visit (INDEPENDENT_AMBULATORY_CARE_PROVIDER_SITE_OTHER): Payer: 59 | Admitting: Emergency Medicine

## 2020-11-20 ENCOUNTER — Encounter: Payer: Self-pay | Admitting: Emergency Medicine

## 2020-11-20 ENCOUNTER — Other Ambulatory Visit: Payer: Self-pay

## 2020-11-20 VITALS — BP 132/80 | HR 85 | Temp 98.6°F | Ht 66.0 in | Wt 231.0 lb

## 2020-11-20 DIAGNOSIS — E785 Hyperlipidemia, unspecified: Secondary | ICD-10-CM

## 2020-11-20 DIAGNOSIS — E1159 Type 2 diabetes mellitus with other circulatory complications: Secondary | ICD-10-CM | POA: Diagnosis not present

## 2020-11-20 DIAGNOSIS — Z7689 Persons encountering health services in other specified circumstances: Secondary | ICD-10-CM | POA: Diagnosis not present

## 2020-11-20 DIAGNOSIS — M25511 Pain in right shoulder: Secondary | ICD-10-CM | POA: Insufficient documentation

## 2020-11-20 DIAGNOSIS — M25512 Pain in left shoulder: Secondary | ICD-10-CM | POA: Insufficient documentation

## 2020-11-20 DIAGNOSIS — E1169 Type 2 diabetes mellitus with other specified complication: Secondary | ICD-10-CM | POA: Diagnosis not present

## 2020-11-20 DIAGNOSIS — G8929 Other chronic pain: Secondary | ICD-10-CM | POA: Insufficient documentation

## 2020-11-20 DIAGNOSIS — I152 Hypertension secondary to endocrine disorders: Secondary | ICD-10-CM | POA: Diagnosis not present

## 2020-11-20 LAB — COMPREHENSIVE METABOLIC PANEL
ALT: 43 U/L (ref 0–53)
AST: 24 U/L (ref 0–37)
Albumin: 4.8 g/dL (ref 3.5–5.2)
Alkaline Phosphatase: 47 U/L (ref 39–117)
BUN: 14 mg/dL (ref 6–23)
CO2: 27 mEq/L (ref 19–32)
Calcium: 9.8 mg/dL (ref 8.4–10.5)
Chloride: 104 mEq/L (ref 96–112)
Creatinine, Ser: 1.09 mg/dL (ref 0.40–1.50)
GFR: 78.74 mL/min (ref >60.00)
Glucose, Bld: 115 mg/dL — ABNORMAL HIGH (ref 70–99)
Potassium: 4.3 mEq/L (ref 3.5–5.1)
Sodium: 140 mEq/L (ref 135–145)
Total Bilirubin: 0.9 mg/dL (ref 0.2–1.2)
Total Protein: 7.9 g/dL (ref 6.0–8.3)

## 2020-11-20 LAB — HEMOGLOBIN A1C: Hgb A1c MFr Bld: 8.4 % — ABNORMAL HIGH (ref 4.6–6.5)

## 2020-11-20 LAB — LIPID PANEL
Cholesterol: 141 mg/dL (ref 0–200)
HDL: 39.2 mg/dL (ref >39.00)
LDL Cholesterol: 83 mg/dL (ref 0–99)
NonHDL: 101.34
Total CHOL/HDL Ratio: 4
Triglycerides: 93 mg/dL (ref 0.0–149.0)
VLDL: 18.6 mg/dL (ref 0.0–40.0)

## 2020-11-20 NOTE — Assessment & Plan Note (Signed)
Diet and nutrition discussed.  Continue atorvastatin 20 mg daily. The 10-year ASCVD risk score Denman George DC Montez Hageman., et al., 2013) is: 17.4%   Values used to calculate the score:     Age: 50 years     Sex: Male     Is Non-Hispanic African American: Yes     Diabetic: Yes     Tobacco smoker: No     Systolic Blood Pressure: 132 mmHg     Is BP treated: Yes     HDL Cholesterol: 40 mg/dL     Total Cholesterol: 149 mg/dL

## 2020-11-20 NOTE — Patient Instructions (Signed)
Health Maintenance, Male Adopting a healthy lifestyle and getting preventive care are important in promoting health and wellness. Ask your health care provider about: The right schedule for you to have regular tests and exams. Things you can do on your own to prevent diseases and keep yourself healthy. What should I know about diet, weight, and exercise? Eat a healthy diet  Eat a diet that includes plenty of vegetables, fruits, low-fat dairy products, and lean protein. Do not eat a lot of foods that are high in solid fats, added sugars, or sodium.  Maintain a healthy weight Body mass index (BMI) is a measurement that can be used to identify possible weight problems. It estimates body fat based on height and weight. Your health care provider can help determine your BMI and help you achieve or maintain ahealthy weight. Get regular exercise Get regular exercise. This is one of the most important things you can do for your health. Most adults should: Exercise for at least 150 minutes each week. The exercise should increase your heart rate and make you sweat (moderate-intensity exercise). Do strengthening exercises at least twice a week. This is in addition to the moderate-intensity exercise. Spend less time sitting. Even light physical activity can be beneficial. Watch cholesterol and blood lipids Have your blood tested for lipids and cholesterol at 51 years of age, then havethis test every 5 years. You may need to have your cholesterol levels checked more often if: Your lipid or cholesterol levels are high. You are older than 51 years of age. You are at high risk for heart disease. What should I know about cancer screening? Many types of cancers can be detected early and may often be prevented. Depending on your health history and family history, you may need to have cancer screening at various ages. This may include screening for: Colorectal cancer. Prostate cancer. Skin cancer. Lung  cancer. What should I know about heart disease, diabetes, and high blood pressure? Blood pressure and heart disease High blood pressure causes heart disease and increases the risk of stroke. This is more likely to develop in people who have high blood pressure readings, are of African descent, or are overweight. Talk with your health care provider about your target blood pressure readings. Have your blood pressure checked: Every 3-5 years if you are 18-39 years of age. Every year if you are 40 years old or older. If you are between the ages of 65 and 75 and are a current or former smoker, ask your health care provider if you should have a one-time screening for abdominal aortic aneurysm (AAA). Diabetes Have regular diabetes screenings. This checks your fasting blood sugar level. Have the screening done: Once every three years after age 45 if you are at a normal weight and have a low risk for diabetes. More often and at a younger age if you are overweight or have a high risk for diabetes. What should I know about preventing infection? Hepatitis B If you have a higher risk for hepatitis B, you should be screened for this virus. Talk with your health care provider to find out if you are at risk forhepatitis B infection. Hepatitis C Blood testing is recommended for: Everyone born from 1945 through 1965. Anyone with known risk factors for hepatitis C. Sexually transmitted infections (STIs) You should be screened each year for STIs, including gonorrhea and chlamydia, if: You are sexually active and are younger than 51 years of age. You are older than 51 years of age   and your health care provider tells you that you are at risk for this type of infection. Your sexual activity has changed since you were last screened, and you are at increased risk for chlamydia or gonorrhea. Ask your health care provider if you are at risk. Ask your health care provider about whether you are at high risk for HIV.  Your health care provider may recommend a prescription medicine to help prevent HIV infection. If you choose to take medicine to prevent HIV, you should first get tested for HIV. You should then be tested every 3 months for as long as you are taking the medicine. Follow these instructions at home: Lifestyle Do not use any products that contain nicotine or tobacco, such as cigarettes, e-cigarettes, and chewing tobacco. If you need help quitting, ask your health care provider. Do not use street drugs. Do not share needles. Ask your health care provider for help if you need support or information about quitting drugs. Alcohol use Do not drink alcohol if your health care provider tells you not to drink. If you drink alcohol: Limit how much you have to 0-2 drinks a day. Be aware of how much alcohol is in your drink. In the [place] one drink equals one 12 oz bottle of beer (355 mL), one 5 oz glass of wine (148 mL), or one 1 oz glass of hard liquor (44 mL). General instructions Schedule regular health, dental, and eye exams. Stay current with your vaccines. Tell your health care provider if: You often feel depressed. You have ever been abused or do not feel safe at home. Summary Adopting a healthy lifestyle and getting preventive care are important in promoting health and wellness. Follow your health care provider's instructions about healthy diet, exercising, and getting tested or screened for diseases. Follow your health care provider's instructions on monitoring your cholesterol and blood pressure. This information is not intended to replace advice given to you by your health care provider. Make sure you discuss any questions you have with your healthcare provider. Document Revised: 04/12/2018 Document Reviewed: 04/12/2018 Elsevier Patient Education  2022 Elsevier Inc.  

## 2020-11-20 NOTE — Assessment & Plan Note (Signed)
Well-controlled hypertension.  Continue amlodipine 10 mg and lisinopril 10 mg daily. Well-controlled diabetes with hemoglobin A1c at 8.0.  Continue metformin 500 mg twice a day and Jardiance 25 mg daily. Diet and nutrition discussed. Follow-up in 3 months.

## 2020-11-20 NOTE — Progress Notes (Signed)
Lab Results  Component Value Date   HGBA1C 8.0 (A) 11/05/2020   BP Readings from Last 3 Encounters:  11/10/20 132/86  10/08/20 127/82  09/10/20 (!) 150/98   Wt Readings from Last 3 Encounters:  09/10/20 235 lb (106.6 kg)  08/08/20 230 lb (104.3 kg)  05/26/20 238 lb (108 kg)   Scott [name] 50 y.o.   Chief Complaint  Patient presents with   New Patient (Initial Visit)    Pt would like a physical    HISTORY OF PRESENT ILLNESS: This is a 50 y.o. male first visit to this office, here to establish care with me. Was a physical. Past medical history includes the following: 1.  Diabetes with recent hemoglobin A1c of 8.0.  Presently metformin 500 mg twice a day and Jardiance 25 mg daily. 2.  Hypertension on amlodipine 10 mg daily and lisinopril 20 mg daily. 3.  Dyslipidemia: On atorvastatin 20 mg daily Has no complaints or medical concerns today. Most recent office visits reviewed, most recent blood work results reviewed, review of all medications, health maintenance items reviewed.  HPI   Prior to Admission medications   Medication Sig Start Date End Date Taking? Authorizing Provider  amLODipine (NORVASC) 10 MG tablet Take 1 tablet (10 mg total) by mouth daily. 11/05/20   Mayers, Cari S, PA-C  ASPIRIN LOW DOSE 81 MG EC tablet TAKE ONE TABLET BY MOUTH DAILY 05/30/20   Claiborne Rigg, NP  atorvastatin (LIPITOR) 20 MG tablet Take 1 tablet (20 mg total) by mouth daily. 11/05/20   Mayers, Cari S, PA-C  COVID-19 mRNA vaccine, Pfizer, 30 MCG/0.3ML injection Inject into the muscle. 08/05/20   Judyann Munson, MD  fluticasone (FLONASE) 50 MCG/ACT nasal spray Place 2 sprays into both nostrils daily. 10/08/20   Rushie Chestnut, PA-C  glucose blood test strip Use as instructed. Inject into the skin once daily. E11.65 11/05/20   Mayers, Cari S, PA-C  guaiFENesin (MUCINEX) 600 MG 12 hr tablet Take 2 tablets by mouth in the morning and at bedtime. 10/09/20 10/09/21  [provider]   Insulin Pen Needle (B-D UF III MINI PEN NEEDLES) 31G X 5 MM MISC Use as instructed. Inject into the skin once nightly. 11/05/20   Mayers, Cari S, PA-C  JARDIANCE 25 MG TABS tablet Take 1 tablet (25 mg total) by mouth daily. 11/05/20   Mayers, Kasandra Knudsen, PA-C  Lancet Devices (ONE TOUCH DELICA LANCING DEV) MISC Use as instructed. Inject into the skin once daily 11/05/20   Mayers, Cari S, PA-C  lisinopril (ZESTRIL) 20 MG tablet Take 1 tablet (20 mg total) by mouth daily. 11/05/20   Mayers, Cari S, PA-C  loratadine (CLARITIN REDITABS) 10 MG dissolvable tablet Take 1 tablet (10 mg total) by mouth daily. 12/13/18   Anders Simmonds, PA-C  metFORMIN (GLUCOPHAGE) 500 MG tablet Take 1 tablet (500 mg total) by mouth 2 (two) times daily with a meal. 11/05/20   Mayers, Cari S, PA-C    Allergies  Allergen Reactions   Pollen Extract Other (See Comments)    Patient Active Problem List   Diagnosis Date Noted   Shoulder pain, bilateral 11/20/2020   Hyperlipidemia 04/23/2020   Class 2 severe obesity due to excess calories with serious comorbidity and body mass index (BMI) of 37.0 to 37.9 in adult (HCC) 02/13/2019   Sleep apnea 01/23/2019   Chronic diastolic heart failure (HCC) 11/07/2016   OSA (obstructive sleep apnea) 04/18/2012   Left ventricular diastolic dysfunction, NYHA class  1/moderate LVH with concentric hypertrophy 09/03/2011   ? DVT of popliteal vein, right (non occlusive) 09/03/2011   Pulmonary embolism (HCC) 09/02/2011   Dyslipidemia associated with type 2 diabetes mellitus (HCC) 09/02/2011   Hypertension associated with diabetes (HCC) 09/02/2011    Past Medical History:  Diagnosis Date   Carpal tunnel syndrome    Chronic migraine 01/23/2019   Diabetes mellitus    Diastolic heart failure (HCC)    Hypertension    Obesity    Pulmonary embolism (HCC) 2013   He has a history of HTN.  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 A repeat scan done in 2017 did not demonstrate  evidence of emboli. Neither study demonstrated any coronary calcifications.    Shoulder pain, bilateral    Sleep apnea    was fitted for mask and never got machine - too costly    Past Surgical History:  Procedure Laterality Date   CARPAL TUNNEL RELEASE Right 12/04/2015   Procedure: RIGHT CARPAL TUNNEL RELEASE;  Surgeon: Betha Loa, MD;  Location: [facility];  Service: Orthopedics;  Laterality: Right;  RIGHT CARPAL TUNNEL RELEASE   CARPAL TUNNEL RELEASE Right 12/2015   [facility]   CARPAL TUNNEL RELEASE Left 04/01/2016   Procedure: LEFT CARPAL TUNNEL RELEASE;  Surgeon: Betha Loa, MD;  Location: [facility];  Service: Orthopedics;  Laterality: Left;   right hand surgery     ROTATOR CUFF REPAIR      Social History   Socioeconomic History   Marital status: Married    Spouse name: Not on file   Number of children: Not on file   Years of education: 12+   Highest education level: Not on file  Occupational History   Occupation: real estate agent  Tobacco Use   Smoking status: Never   Smokeless tobacco: Never  Vaping Use   Vaping Use: Never used  Substance and Sexual Activity   Alcohol use: No    Alcohol/week: 0.0 standard drinks   Drug use: No   Sexual activity: Yes  Other Topics Concern   Not on file  Social History Narrative   Lives at home.   Right-handed.   Occasional caffeine use.   Tea every other day   Social Determinants of Health   Financial Resource Strain: Not on file  Food Insecurity: Not on file  Transportation Needs: Not on file  Physical Activity: Not on file  Stress: Not on file  Social Connections: Not on file  Intimate Partner Violence: Not on file    Family History  Problem Relation Age of Onset   Hypertension Mother    Kidney disease Father    Hypertension Other    Hyperlipidemia Other    Diabetes Other      Review of Systems  Constitutional: Negative.  Negative for chills and fever.  HENT: Negative.  Negative  for congestion and sore throat.   Respiratory: Negative.  Negative for cough and shortness of breath.   Cardiovascular: Negative.  Negative for chest pain and palpitations.  Gastrointestinal: Negative.  Negative for abdominal pain, blood in stool, diarrhea, melena, nausea and vomiting.  Genitourinary: Negative.  Negative for dysuria, frequency and hematuria.  Skin: Negative.  Negative for rash.  Neurological:  Negative for dizziness and headaches.  All other systems reviewed and are negative. Today's Vitals   11/20/20 1303  BP: 132/80  Pulse: 85  Temp: 98.6 F (37 C)  TempSrc: Oral  SpO2: 98%  Weight:  231 lb (104.8 kg)  Height: 5\' 6"  (1.676 m)   Body mass index is 37.28 kg/m.   Physical Exam Vitals reviewed.  Constitutional:      Appearance: Normal appearance.  HENT:     Head: Normocephalic.     Right Ear: Ear canal and external ear normal. There is impacted cerumen.     Left Ear: Tympanic membrane, ear canal and external ear normal.     Mouth/Throat:     Mouth: Mucous membranes are moist.     Pharynx: Oropharynx is clear.  Eyes:     Extraocular Movements: Extraocular movements intact.     Conjunctiva/sclera: Conjunctivae normal.     Pupils: Pupils are equal, round, and reactive to light.  Neck:     Vascular: No carotid bruit.  Cardiovascular:     Rate and Rhythm: Normal rate and regular rhythm.     Pulses: Normal pulses.     Heart sounds: Normal heart sounds.  Pulmonary:     Effort: Pulmonary effort is normal.     Breath sounds: Normal breath sounds.  Abdominal:     General: Bowel sounds are normal. There is no distension.     Palpations: Abdomen is soft. There is no mass.     Tenderness: There is no abdominal tenderness.  Musculoskeletal:        General: Normal range of motion.     Cervical back: Normal range of motion and neck supple. No tenderness.     Right lower leg: No edema.     Left lower leg: No edema.  Lymphadenopathy:     Cervical: No cervical  adenopathy.  Skin:    General: Skin is warm and dry.     Capillary Refill: Capillary refill takes less than 2 seconds.  Neurological:     General: No focal deficit present.     Mental Status: He is alert and oriented to person, place, and time.  Psychiatric:        Mood and Affect: Mood normal.        Behavior: Behavior normal.     ASSESSMENT & PLAN: A total of 45 minutes was spent with the patient and counseling/coordination of care regarding preparing for this visit, review of most recent office visit notes by other practitioners, review of all medications, review of most recent blood work results, cardiovascular risks associated with hypertension, diabetes and dyslipidemia, education on nutrition and need to decrease amount of daily carbohydrate intake, health maintenance items, prognosis, documentation and need for follow-up.  Hypertension associated with diabetes (HCC) Well-controlled hypertension.  Continue amlodipine 10 mg and lisinopril 10 mg daily. Well-controlled diabetes with hemoglobin A1c at 8.0.  Continue metformin 500 mg twice a day and Jardiance 25 mg daily. Diet and nutrition discussed. Follow-up in 3 months.  Dyslipidemia associated with type 2 diabetes mellitus (HCC) Diet and nutrition discussed.  Continue atorvastatin 20 mg daily. The 10-year ASCVD risk score DC Denman George., et al., 2013) is: 17.4%   Values used to calculate the score:     Age: 89 years     Sex: Male     Is Non-Hispanic African American: Yes     Diabetic: Yes     Tobacco smoker: No     Systolic Blood Pressure: 132 mmHg     Is BP treated: Yes     HDL Cholesterol: 40 mg/dL     Total Cholesterol: 149 mg/dL Sabri was seen today for new patient (initial visit).  Diagnoses and all orders for  this visit:  Hypertension associated with diabetes (HCC) -     Comprehensive metabolic panel -     Hemoglobin A1c  Dyslipidemia associated with type 2 diabetes mellitus (HCC) -     Lipid panel -      Hemoglobin A1c  Encounter to establish care  Patient Instructions  Health Maintenance, Male Adopting a healthy lifestyle and getting preventive care are important in promoting health and wellness. Ask your health care provider about: The right schedule for you to have regular tests and exams. Things you can do on your own to prevent diseases and keep yourself healthy. What should I know about diet, weight, and exercise? Eat a healthy diet  Eat a diet that includes plenty of vegetables, fruits, low-fat dairy products, and lean protein. Do not eat a lot of foods that are high in solid fats, added sugars, or sodium.  Maintain a healthy weight Body mass index (BMI) is a measurement that can be used to identify possible weight problems. It estimates body fat based on height and weight. Your health care provider can help determine your BMI and help you achieve or maintain ahealthy weight. Get regular exercise Get regular exercise. This is one of the most important things you can do for your health. Most adults should: Exercise for at least 150 minutes each week. The exercise should increase your heart rate and make you sweat (moderate-intensity exercise). Do strengthening exercises at least twice a week. This is in addition to the moderate-intensity exercise. Spend less time sitting. Even light physical activity can be beneficial. Watch cholesterol and blood lipids Have your blood tested for lipids and cholesterol at 51 years of age, then havethis test every 5 years. You may need to have your cholesterol levels checked more often if: Your lipid or cholesterol levels are high. You are older than 51 years of age. You are at high risk for heart disease. What should I know about cancer screening? Many types of cancers can be detected early and may often be prevented. Depending on your health history and family history, you may need to have cancer screening at various ages. This may include  screening for: Colorectal cancer. Prostate cancer. Skin cancer. Lung cancer. What should I know about heart disease, diabetes, and high blood pressure? Blood pressure and heart disease High blood pressure causes heart disease and increases the risk of stroke. This is more likely to develop in people who have high blood pressure readings, are of African descent, or are overweight. Talk with your health care provider about your target blood pressure readings. Have your blood pressure checked: Every 3-5 years if you are 5718-51 years of age. Every year if you are 51 years old or older. If you are between the ages of 4165 and 3175 and are a current or former smoker, ask your health care provider if you should have a one-time screening for abdominal aortic aneurysm (AAA). Diabetes Have regular diabetes screenings. This checks your fasting blood sugar level. Have the screening done: Once every three years after age 51 if you are at a normal weight and have a low risk for diabetes. More often and at a younger age if you are overweight or have a high risk for diabetes. What should I know about preventing infection? Hepatitis B If you have a higher risk for hepatitis B, you should be screened for this virus. Talk with your health care provider to find out if you are at risk forhepatitis B infection. Hepatitis C  Blood testing is recommended for: Everyone born from [birth year] through 1965. Anyone with known risk factors for hepatitis C. Sexually transmitted infections (STIs) You should be screened each year for STIs, including gonorrhea and chlamydia, if: You are sexually active and are younger than 51 years of age. You are older than 51 years of age and your health care provider tells you that you are at risk for this type of infection. Your sexual activity has changed since you were last screened, and you are at increased risk for chlamydia or gonorrhea. Ask your health care provider if you are at risk. Ask  your health care provider about whether you are at high risk for HIV. Your health care provider may recommend a prescription medicine to help prevent HIV infection. If you choose to take medicine to prevent HIV, you should first get tested for HIV. You should then be tested every 3 months for as long as you are taking the medicine. Follow these instructions at home: Lifestyle Do not use any products that contain nicotine or tobacco, such as cigarettes, e-cigarettes, and chewing tobacco. If you need help quitting, ask your health care provider. Do not use street drugs. Do not share needles. Ask your health care provider for help if you need support or information about quitting drugs. Alcohol use Do not drink alcohol if your health care provider tells you not to drink. If you drink alcohol: Limit how much you have to 0-2 drinks a day. Be aware of how much alcohol is in your drink. In the [place] one drink equals one 12 oz bottle of beer (355 mL), one 5 oz glass of wine (148 mL), or one 1 oz glass of hard liquor (44 mL). General instructions Schedule regular health, dental, and eye exams. Stay current with your vaccines. Tell your health care provider if: You often feel depressed. You have ever been abused or do not feel safe at home. Summary Adopting a healthy lifestyle and getting preventive care are important in promoting health and wellness. Follow your health care provider's instructions about healthy diet, exercising, and getting tested or screened for diseases. Follow your health care provider's instructions on monitoring your cholesterol and blood pressure. This information is not intended to replace advice given to you by your health care provider. Make sure you discuss any questions you have with your healthcare provider. Document Revised: 04/12/2018 Document Reviewed: 04/12/2018 Elsevier Patient Education  2022 Elsevier Inc.    Edwina Barth, MD [facility]

## 2020-11-21 ENCOUNTER — Ambulatory Visit (INDEPENDENT_AMBULATORY_CARE_PROVIDER_SITE_OTHER): Payer: 59 | Admitting: Cardiology

## 2020-11-21 ENCOUNTER — Encounter: Payer: Self-pay | Admitting: Cardiology

## 2020-11-21 VITALS — BP 124/84 | HR 88 | Ht 66.0 in | Wt 233.0 lb

## 2020-11-21 DIAGNOSIS — Z86010 Personal history of colonic polyps: Secondary | ICD-10-CM | POA: Insufficient documentation

## 2020-11-21 DIAGNOSIS — E1159 Type 2 diabetes mellitus with other circulatory complications: Secondary | ICD-10-CM | POA: Diagnosis not present

## 2020-11-21 DIAGNOSIS — I5189 Other ill-defined heart diseases: Secondary | ICD-10-CM | POA: Diagnosis not present

## 2020-11-21 DIAGNOSIS — R195 Other fecal abnormalities: Secondary | ICD-10-CM | POA: Insufficient documentation

## 2020-11-21 DIAGNOSIS — I5032 Chronic diastolic (congestive) heart failure: Secondary | ICD-10-CM | POA: Diagnosis not present

## 2020-11-21 DIAGNOSIS — E1169 Type 2 diabetes mellitus with other specified complication: Secondary | ICD-10-CM

## 2020-11-21 DIAGNOSIS — I152 Hypertension secondary to endocrine disorders: Secondary | ICD-10-CM

## 2020-11-21 DIAGNOSIS — Z01810 Encounter for preprocedural cardiovascular examination: Secondary | ICD-10-CM | POA: Insufficient documentation

## 2020-11-21 DIAGNOSIS — G4733 Obstructive sleep apnea (adult) (pediatric): Secondary | ICD-10-CM

## 2020-11-21 DIAGNOSIS — Z6837 Body mass index (BMI) 37.0-37.9, adult: Secondary | ICD-10-CM

## 2020-11-21 DIAGNOSIS — E785 Hyperlipidemia, unspecified: Secondary | ICD-10-CM

## 2020-11-21 NOTE — Patient Instructions (Signed)
Medication Instructions:  Your physician recommends that you continue on your current medications as directed. Please refer to the Current Medication list given to you today.  *If you need a refill on your cardiac medications before your next appointment, please call your pharmacy*   Lab Work: None If you have labs (blood work) drawn today and your tests are completely normal, you will receive your results only by: MyChart Message (if you have MyChart) OR A paper copy in the mail If you have any lab test that is abnormal or we need to change your treatment, we will call you to review the results.   Testing/Procedures: Your physician has requested that you have an echocardiogram. Echocardiography is a painless test that uses sound waves to create images of your heart. It provides your doctor with information about the size and shape of your heart and how well your heart's chambers and valves are working. This procedure takes approximately one hour. There are no restrictions for this procedure.    Follow-Up: At [facility], you and your health needs are our priority.  As part of our continuing mission to provide you with exceptional heart care, we have created designated Provider Care Teams.  These Care Teams include your primary Cardiologist (physician) and Advanced Practice Providers (APPs -  Physician Assistants and Nurse Practitioners) who all work together to provide you with the care you need, when you need it.  We recommend signing up for the patient portal called "MyChart".  Sign up information is provided on this After Visit Summary.  MyChart is used to connect with patients for Virtual Visits (Telemedicine).  Patients are able to view lab/test results, encounter notes, upcoming appointments, etc.  Non-urgent messages can be sent to your provider as well.   To learn more about what you can do with MyChart, go to [email or web address].    Your next appointment:   5 months  The  format for your next appointment:   In Person  Provider:   Robert Krasowski, MD   Other Instructions Echocardiogram An echocardiogram is a test that uses sound waves (ultrasound) to produce images of the heart. Images from an echocardiogram can provide important information about: Heart size and shape. The size and thickness and movement of your heart's walls. Heart muscle function and strength. Heart valve function or if you have stenosis. Stenosis is when the heart valves are too narrow. If blood is flowing backward through the heart valves (regurgitation). A tumor or infectious growth around the heart valves. Areas of heart muscle that are not working well because of poor blood flow or injury from a heart attack. Aneurysm detection. An aneurysm is a weak or damaged part of an artery wall. The wall bulges out from the normal force of blood pumping through the body. Tell a health care provider about: Any allergies you have. All medicines you are taking, including vitamins, herbs, eye drops, creams, and over-the-counter medicines. Any blood disorders you have. Any surgeries you have had. Any medical conditions you have. Whether you are pregnant or may be pregnant. What are the risks? Generally, this is a safe test. However, problems may occur, including an allergic reaction to dye (contrast) that may be used during the test. What happens before the test? No specific preparation is needed. You may eat and drink normally. What happens during the test?  You will take off your clothes from the waist up and put on a hospital gown. Electrodes or electrocardiogram (ECG)patches may be placed on   your chest. The electrodes or patches are then connected to a device that monitors your heart rate and rhythm. You will lie down on a table for an ultrasound exam. A gel will be applied to your chest to help sound waves pass through your skin. A handheld device, called a transducer, will be pressed  against your chest and moved over your heart. The transducer produces sound waves that travel to your heart and bounce back (or "echo" back) to the transducer. These sound waves will be captured in real-time and changed into images of your heart that can be viewed on a video monitor. The images will be recorded on a computer and reviewed by your health care provider. You may be asked to change positions or hold your breath for a short time. This makes it easier to get different views or better views of your heart. In some cases, you may receive contrast through an IV in one of your veins. This can improve the quality of the pictures from your heart. The procedure may vary among health care providers and hospitals. What can I expect after the test? You may return to your normal, everyday life, including diet, activities, and medicines, unless your health care provider tells you not to do that. Follow these instructions at home: It is up to you to get the results of your test. Ask your health care provider, or the department that is doing the test, when your results will be ready. Keep all follow-up visits. This is important. Summary An echocardiogram is a test that uses sound waves (ultrasound) to produce images of the heart. Images from an echocardiogram can provide important information about the size and shape of your heart, heart muscle function, heart valve function, and other possible heart problems. You do not need to do anything to prepare before this test. You may eat and drink normally. After the echocardiogram is completed, you may return to your normal, everyday life, unless your health care provider tells you not to do that. This information is not intended to replace advice given to you by your health care provider. Make sure you discuss any questions you have with your health care provider. Document Revised: 12/11/2019 Document Reviewed: 12/11/2019 Elsevier Patient Education  2022  Elsevier Inc.   

## 2020-11-21 NOTE — Progress Notes (Signed)
Cardiology Consultation:    Date:  11/21/2020   ID:  [name], [name] 1970-07-31, MRN [id number]  PCP:  Georgina Quint, MD  Cardiologist:  Gypsy Balsam, MD   Referring MD: Claiborne Rigg, NP   Chief Complaint  Patient presents with   Clearance TBD    Foot surgery Dr. Katrinka Blazing. Hx CHF per PCP    History of Present Illness:    Scott [name] is a 50 y.o. male who is being seen today for the evaluation of foot surgery evaluation at the request of Claiborne Rigg, NP.  His past medical history significant for diabetes, essential hypertension, dyslipidemia, obesity, history of pulmonary emboli diagnosed in 2013, he was seen by our team in 2019 when he was evaluated for atypical chest pain.  He did have coronary CT angiogram at that time which showed normal coronaries.  He comes here today because he required heel surgery and he would like to be evaluated and assessed from cardiac standpoint reviewed. Overall he is doing very well.  In spite of the fact that he got some heel problem he is able to exercise.  He gets pain bike at home and he goes on the bike and push himself hard with no difficulties.  There is no chest pain tightness squeezing pressure burning chest.  There is no swelling of lower extremities there is no proximal nocturnal dyspnea. He does not smoke He does have family history of but not premature coronary artery disease He is obese  Past Medical History:  Diagnosis Date   Carpal tunnel syndrome    Chronic migraine 01/23/2019   Diabetes mellitus    Diastolic heart failure (HCC)    Hypertension    Obesity    Pulmonary embolism (HCC) 2013   He has a history of HTN.  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 A repeat scan done in 2017 did not demonstrate evidence of emboli. Neither study demonstrated any coronary calcifications.    Shoulder pain, bilateral    Sleep apnea    was fitted for mask and never got machine - too  costly    Past Surgical History:  Procedure Laterality Date   CARPAL TUNNEL RELEASE Right 12/04/2015   Procedure: RIGHT CARPAL TUNNEL RELEASE;  Surgeon: Betha Loa, MD;  Location: [facility];  Service: Orthopedics;  Laterality: Right;  RIGHT CARPAL TUNNEL RELEASE   CARPAL TUNNEL RELEASE Right 12/2015   [facility]   CARPAL TUNNEL RELEASE Left 04/01/2016   Procedure: LEFT CARPAL TUNNEL RELEASE;  Surgeon: Betha Loa, MD;  Location: [facility];  Service: Orthopedics;  Laterality: Left;   right hand surgery     ROTATOR CUFF REPAIR      Current Medications: Current Meds  Medication Sig   amLODipine (NORVASC) 10 MG tablet Take 1 tablet (10 mg total) by mouth daily.   ASPIRIN LOW DOSE 81 MG EC tablet TAKE ONE TABLET BY MOUTH DAILY (Patient taking differently: Take 81 mg by mouth daily.)   atorvastatin (LIPITOR) 20 MG tablet Take 1 tablet (20 mg total) by mouth daily.   COVID-19 mRNA vaccine, Pfizer, 30 MCG/0.3ML injection Inject into the muscle. (Patient taking differently: Inject 0.3 mLs into the muscle once.)   fluticasone (FLONASE) 50 MCG/ACT nasal spray Place 2 sprays into both nostrils daily.   glucose blood test strip Use as instructed. Inject into the skin once daily. E11.65 (Patient taking differently: 1 each by Other route as  needed for other (Glucose reading). Use as instructed. Inject into the skin once daily. E11.65)   JARDIANCE 25 MG TABS tablet Take 1 tablet (25 mg total) by mouth daily.   lisinopril (ZESTRIL) 20 MG tablet Take 1 tablet (20 mg total) by mouth daily.   loratadine (CLARITIN REDITABS) 10 MG dissolvable tablet Take 1 tablet (10 mg total) by mouth daily.   metFORMIN (GLUCOPHAGE) 500 MG tablet Take 1 tablet (500 mg total) by mouth 2 (two) times daily with a meal.     Allergies:   Pollen extract   Social History   Socioeconomic History   Marital status: Married    Spouse name: Not on file   Number of children: Not on file   Years of  education: 12+   Highest education level: Not on file  Occupational History   Occupation: real estate agent  Tobacco Use   Smoking status: Never   Smokeless tobacco: Never  Vaping Use   Vaping Use: Never used  Substance and Sexual Activity   Alcohol use: No    Alcohol/week: 0.0 standard drinks   Drug use: No   Sexual activity: Yes  Other Topics Concern   Not on file  Social History Narrative   Lives at home.   Right-handed.   Occasional caffeine use.   Tea every other day   Social Determinants of Health   Financial Resource Strain: Not on file  Food Insecurity: Not on file  Transportation Needs: Not on file  Physical Activity: Not on file  Stress: Not on file  Social Connections: Not on file     Family History: The patient's family history includes Diabetes in an other family member; Hyperlipidemia in an other family member; Hypertension in his mother and another family member; Kidney disease in his father. ROS:   Please see the history of present illness.    All 14 point review of systems negative except as described per history of present illness.  EKGs/Labs/Other Studies Reviewed:    The following studies were reviewed today: I did review coronary CT angio from 2019 which showed normal coronaries  EKG:  EKG is  ordered today.  The ekg ordered today demonstrates normal sinus rhythm normal P interval normal QS complex duration fulgent no ST segment changes  Recent Labs: 12/05/2019: Hemoglobin 16.4; Platelets 261 11/20/2020: ALT 43; BUN 14; Creatinine, Ser 1.09; Potassium 4.3; Sodium 140  Recent Lipid Panel    Component Value Date/Time   CHOL 141 11/20/2020 1353   CHOL 149 04/23/2020 1027   TRIG 93.0 11/20/2020 1353   HDL 39.20 11/20/2020 1353   HDL 40 04/23/2020 1027   CHOLHDL 4 11/20/2020 1353   VLDL 18.6 11/20/2020 1353   LDLCALC 83 11/20/2020 1353   LDLCALC 88 04/23/2020 1027    Physical Exam:    VS:  BP 124/84 (BP Location: Right Arm, Patient  Position: Sitting)   Pulse 88   Ht 5\' 6"  (1.676 m)   Wt 233 lb (105.7 kg)   SpO2 98%   BMI 37.61 kg/m     Wt Readings from Last 3 Encounters:  11/21/20 233 lb (105.7 kg)  11/20/20 231 lb (104.8 kg)  09/10/20 235 lb (106.6 kg)     GEN:  Well nourished, well developed in no acute distress HEENT: Normal NECK: No JVD; No carotid bruits LYMPHATICS: No lymphadenopathy CARDIAC: RRR, no murmurs, no rubs, no gallops RESPIRATORY:  Clear to auscultation without rales, wheezing or rhonchi  ABDOMEN: Soft, non-tender, non-distended MUSCULOSKELETAL:  No edema; No deformity  SKIN: Warm and dry NEUROLOGIC:  Alert and oriented x 3 PSYCHIATRIC:  Normal affect   ASSESSMENT:    1. Preop cardiovascular exam   2. Chronic diastolic heart failure (HCC)   3. Hypertension associated with diabetes (HCC)   4. Left ventricular diastolic dysfunction, NYHA class 1/moderate LVH with concentric hypertrophy   5. OSA (obstructive sleep apnea)   6. Dyslipidemia associated with type 2 diabetes mellitus (HCC)   7. Class 2 severe obesity due to excess calories with serious comorbidity and body mass index (BMI) of 37.0 to 37.9 in adult [facility])    PLAN:    In order of problems listed above:  Cardiovascular preop evaluation.  Overall she does have good exercise tolerance.  He will have surgery several consider low risk surgery.  I am not sure exactly what kind of anesthesia it will be used but if we dealing with some bone surgery would usually either spinal or general.  Again because of good exercise tolerance and able to use a spin bike and exercise on the regular basis I do not think we have difficulty going to do surgery. Diastolic congestive heart failure but no shortness of breath no swelling of lower extremities no paroxysmal dyspnea overall hemodynamically compensated and euvolemic on physical exam.  I will schedule him to have an echocardiogram done to reassess diastolic function as well as look at the right  ventricle size but from my point of view we should be still fine to proceed with surgery. Essential hypertension: Blood pressure seems to be well controlled we will continue present management. Dyslipidemia I did review his K PN which show me his LDL 88 HDL 40.  He is on statin which I will continue. Obstructive sleep apnea this is diagnosed in the computer however when asked him he said he does not think he have it.  He is determined to lose weight and hopefully in the future will be able to her assess the situation he may require another sleep study. Obesity: Obviously probably he understand it is a problem trying to work on losing weight.   Medication Adjustments/Labs and Tests Ordered: Current medicines are reviewed at length with the patient today.  Concerns regarding medicines are outlined above.  No orders of the defined types were placed in this encounter.  No orders of the defined types were placed in this encounter.   Signed, Georgeanna Lea, MD, [facility]. 11/21/2020 10:49 AM    [facility]

## 2020-11-24 ENCOUNTER — Other Ambulatory Visit: Payer: Self-pay

## 2020-11-24 ENCOUNTER — Ambulatory Visit (HOSPITAL_COMMUNITY): Payer: 59 | Attending: Cardiology

## 2020-11-24 ENCOUNTER — Other Ambulatory Visit: Payer: Self-pay | Admitting: Family Medicine

## 2020-11-24 ENCOUNTER — Ambulatory Visit (INDEPENDENT_AMBULATORY_CARE_PROVIDER_SITE_OTHER): Payer: 59 | Admitting: Family Medicine

## 2020-11-24 VITALS — BP 132/84 | Ht 66.0 in | Wt 231.0 lb

## 2020-11-24 DIAGNOSIS — M545 Low back pain, unspecified: Secondary | ICD-10-CM | POA: Diagnosis not present

## 2020-11-24 DIAGNOSIS — I5032 Chronic diastolic (congestive) heart failure: Secondary | ICD-10-CM | POA: Diagnosis present

## 2020-11-24 DIAGNOSIS — M722 Plantar fascial fibromatosis: Secondary | ICD-10-CM

## 2020-11-24 DIAGNOSIS — I5189 Other ill-defined heart diseases: Secondary | ICD-10-CM | POA: Insufficient documentation

## 2020-11-24 LAB — ECHOCARDIOGRAM COMPLETE
Area-P 1/2: 4.77 cm2
Height: 66 in
S' Lateral: 2.9 cm
Weight: 3696 oz

## 2020-11-24 MED ORDER — METHYLPREDNISOLONE ACETATE 40 MG/ML IJ SUSP
40.0000 mg | Freq: Once | INTRAMUSCULAR | Status: AC
  Administered 2020-11-24: 40 mg via INTRA_ARTICULAR

## 2020-11-24 NOTE — Patient Instructions (Signed)
We will set up a repeat injection for your back. Wait about a week before starting the calf stretching and strengthening exercises. Hold stretches for 20 seconds when you do these for them to be beneficial. Heat if needed 15 minutes at a time. Call me with any questions otherwise follow up as needed.

## 2020-11-25 ENCOUNTER — Encounter: Payer: Self-pay | Admitting: Family Medicine

## 2020-11-25 ENCOUNTER — Telehealth: Payer: Self-pay

## 2020-11-25 NOTE — Telephone Encounter (Signed)
Pt called clinic and I was able to discuss lab results and Dr. Latrelle Dodrill instructions. Pt states he understood and has no questions or concerns at this time.

## 2020-11-25 NOTE — Progress Notes (Signed)
PCP: Georgina Quint, MD  Subjective:   HPI: Patient is a 50 y.o. male here for right heel, calf pain.  Patient continues to deal with intermittent right heel pain. Pain plantar. Started worsening again especially past 2.5 months. Injection in January helped. Wears supportive shoes, doing stretches. Also with fleeting right calf pain that will come on even at rest and last about 5 seconds. No injury to cal. This occurs consistently medially.  Past Medical History:  Diagnosis Date   Carpal tunnel syndrome    Chronic migraine 01/23/2019   Diabetes mellitus    Diastolic heart failure (HCC)    Hypertension    Obesity    Pulmonary embolism (HCC) 2013   He has a history of HTN.  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 A repeat scan done in 2017 did not demonstrate evidence of emboli. Neither study demonstrated any coronary calcifications.    Shoulder pain, bilateral    Sleep apnea    was fitted for mask and never got machine - too costly    Current Outpatient Medications on File Prior to Visit  Medication Sig Dispense Refill   amLODipine (NORVASC) 10 MG tablet Take 1 tablet (10 mg total) by mouth daily. 90 tablet 0   ASPIRIN LOW DOSE 81 MG EC tablet TAKE ONE TABLET BY MOUTH DAILY (Patient taking differently: Take 81 mg by mouth daily.) 90 tablet 1   atorvastatin (LIPITOR) 20 MG tablet Take 1 tablet (20 mg total) by mouth daily. 30 tablet 0   COVID-19 mRNA vaccine, Pfizer, 30 MCG/0.3ML injection Inject into the muscle. (Patient taking differently: Inject 0.3 mLs into the muscle once.) 0.3 mL 0   fluticasone (FLONASE) 50 MCG/ACT nasal spray Place 2 sprays into both nostrils daily. 16 mL 0   glucose blood test strip Use as instructed. Inject into the skin once daily. E11.65 (Patient taking differently: 1 each by Other route as needed for other (Glucose reading). Use as instructed. Inject into the skin once daily. E11.65) 200 each 12   guaiFENesin  (MUCINEX) 600 MG 12 hr tablet Take 2 tablets by mouth in the morning and at bedtime. (Patient not taking: Reported on 11/21/2020)     JARDIANCE 25 MG TABS tablet Take 1 tablet (25 mg total) by mouth daily. 30 tablet 0   lisinopril (ZESTRIL) 20 MG tablet Take 1 tablet (20 mg total) by mouth daily. 30 tablet 0   loratadine (CLARITIN REDITABS) 10 MG dissolvable tablet Take 1 tablet (10 mg total) by mouth daily. 30 tablet 3   metFORMIN (GLUCOPHAGE) 500 MG tablet Take 1 tablet (500 mg total) by mouth 2 (two) times daily with a meal. 60 tablet 0   No current facility-administered medications on file prior to visit.    Past Surgical History:  Procedure Laterality Date   CARPAL TUNNEL RELEASE Right 12/04/2015   Procedure: RIGHT CARPAL TUNNEL RELEASE;  Surgeon: Betha Loa, MD;  Location: [facility];  Service: Orthopedics;  Laterality: Right;  RIGHT CARPAL TUNNEL RELEASE   CARPAL TUNNEL RELEASE Right 12/2015   [facility]   CARPAL TUNNEL RELEASE Left 04/01/2016   Procedure: LEFT CARPAL TUNNEL RELEASE;  Surgeon: Betha Loa, MD;  Location: [facility];  Service: Orthopedics;  Laterality: Left;   right hand surgery     ROTATOR CUFF REPAIR      Allergies  Allergen Reactions   Pollen Extract Other (See Comments)    Social History   Socioeconomic History  Marital status: Married    Spouse name: Not on file   Number of children: Not on file   Years of education: 12+   Highest education level: Not on file  Occupational History   Occupation: real estate agent  Tobacco Use   Smoking status: Never   Smokeless tobacco: Never  Vaping Use   Vaping Use: Never used  Substance and Sexual Activity   Alcohol use: No    Alcohol/week: 0.0 standard drinks   Drug use: No   Sexual activity: Yes  Other Topics Concern   Not on file  Social History Narrative   Lives at home.   Right-handed.   Occasional caffeine use.   Tea every other day   Social Determinants of Health    Financial Resource Strain: Not on file  Food Insecurity: Not on file  Transportation Needs: Not on file  Physical Activity: Not on file  Stress: Not on file  Social Connections: Not on file  Intimate Partner Violence: Not on file    Family History  Problem Relation Age of Onset   Hypertension Mother    Kidney disease Father    Hypertension Other    Hyperlipidemia Other    Diabetes Other     BP 132/84   Ht 5\' 6"  (1.676 m)   Wt 231 lb (104.8 kg)   BMI 37.28 kg/m   [facility] 01/02/2020  Frequency of aerobic exercise (# of days/week) 5  Average time in minutes 45  Frequency of strengthening activities (# of days/week) 0    No flowsheet data found.  Review of Systems: See HPI above.     Objective:  Physical Exam:  Gen: NAD, comfortable in exam room  Right foot/lower leg: No gross deformity, swelling, ecchymoses FROM TTP plantar fascia at insertion on calcaneus.  Minimal medial gastroc tenderness. Thompsons test negative. Negative calcaneal squeeze. NV intact distally.   Assessment & Plan:  1. Right plantar fasciitis - repeated injection today.  Home exercises, stretches.  Arch support.  Limited ultrasound of medial gastroc reassuring - some hypoechoic change distal medial gastroc but no tears visualized.  Exercises for plantar fascia will help with this also.  After informed written consent timeout was performed.  Patient was seated on exam table.  Area overlying medial proximal plantar fascia prepped with alcohol swab then utilizing ultrasound guidance patient's right plantar fascia injected with 2:1 lidocaine: depomedrol.  Patient tolerated procedure well without immediate complications.

## 2020-11-26 ENCOUNTER — Other Ambulatory Visit: Payer: Self-pay

## 2020-11-26 ENCOUNTER — Ambulatory Visit (INDEPENDENT_AMBULATORY_CARE_PROVIDER_SITE_OTHER): Payer: 59 | Admitting: Family Medicine

## 2020-11-26 VITALS — BP 126/90 | Ht 66.0 in | Wt 231.0 lb

## 2020-11-26 DIAGNOSIS — M25562 Pain in left knee: Secondary | ICD-10-CM

## 2020-11-26 DIAGNOSIS — G8929 Other chronic pain: Secondary | ICD-10-CM

## 2020-11-26 NOTE — Progress Notes (Signed)
Patient came into the office today for worsening left medial knee pain.  Had injection 2 months ago which only provided transient relief, home exercise program.  At that visit we discussed if not getting better given minimal arthropathy on radiographs would go ahead with MRI of this knee so will move forward.

## 2020-11-26 NOTE — Addendum Note (Signed)
Addended by: Rutha Bouchard E on: 11/26/2020 01:48 PM   Modules accepted: Orders

## 2020-11-27 ENCOUNTER — Other Ambulatory Visit: Payer: 59

## 2020-12-04 ENCOUNTER — Ambulatory Visit
Admission: RE | Admit: 2020-12-04 | Discharge: 2020-12-04 | Disposition: A | Discharge status: Home / Self Care | Payer: 59 | Source: Ambulatory Visit | Attending: Family Medicine | Admitting: Family Medicine

## 2020-12-04 ENCOUNTER — Other Ambulatory Visit: Payer: Self-pay | Admitting: Nurse Practitioner

## 2020-12-04 ENCOUNTER — Other Ambulatory Visit: Payer: Self-pay | Admitting: Physician Assistant

## 2020-12-04 ENCOUNTER — Other Ambulatory Visit: Payer: Self-pay

## 2020-12-04 DIAGNOSIS — M545 Low back pain, unspecified: Secondary | ICD-10-CM

## 2020-12-04 DIAGNOSIS — Z794 Long term (current) use of insulin: Secondary | ICD-10-CM

## 2020-12-04 DIAGNOSIS — E1165 Type 2 diabetes mellitus with hyperglycemia: Secondary | ICD-10-CM

## 2020-12-04 DIAGNOSIS — Z760 Encounter for issue of repeat prescription: Secondary | ICD-10-CM

## 2020-12-04 MED ORDER — METHYLPREDNISOLONE ACETATE 40 MG/ML INJ SUSP (RADIOLOG
80.0000 mg | Freq: Once | INTRAMUSCULAR | Status: AC
  Administered 2020-12-04: 80 mg via INTRA_ARTICULAR

## 2020-12-04 MED ORDER — IOPAMIDOL (ISOVUE-M 200) INJECTION 41%
1.0000 mL | Freq: Once | INTRAMUSCULAR | Status: AC
  Administered 2020-12-04: 1 mL via INTRA_ARTICULAR

## 2020-12-04 NOTE — Discharge Instructions (Signed)

## 2020-12-09 ENCOUNTER — Other Ambulatory Visit: Payer: 59

## 2020-12-17 ENCOUNTER — Ambulatory Visit
Admission: RE | Admit: 2020-12-17 | Discharge: 2020-12-17 | Disposition: A | Discharge status: Home / Self Care | Payer: 59 | Source: Ambulatory Visit | Attending: Family Medicine | Admitting: Family Medicine

## 2020-12-17 ENCOUNTER — Other Ambulatory Visit: Payer: Self-pay

## 2020-12-17 DIAGNOSIS — M25562 Pain in left knee: Secondary | ICD-10-CM

## 2020-12-17 DIAGNOSIS — G8929 Other chronic pain: Secondary | ICD-10-CM

## 2020-12-23 ENCOUNTER — Other Ambulatory Visit: Payer: Self-pay | Admitting: Nurse Practitioner

## 2020-12-23 DIAGNOSIS — Z760 Encounter for issue of repeat prescription: Secondary | ICD-10-CM

## 2020-12-23 NOTE — Telephone Encounter (Signed)
Pt of [facility]

## 2020-12-24 ENCOUNTER — Ambulatory Visit (INDEPENDENT_AMBULATORY_CARE_PROVIDER_SITE_OTHER): Payer: 59 | Admitting: Emergency Medicine

## 2020-12-24 ENCOUNTER — Ambulatory Visit: Payer: 59 | Admitting: Emergency Medicine

## 2020-12-24 ENCOUNTER — Other Ambulatory Visit: Payer: Self-pay

## 2020-12-24 ENCOUNTER — Encounter: Payer: Self-pay | Admitting: Emergency Medicine

## 2020-12-24 VITALS — BP 146/86 | HR 91 | Ht 66.0 in | Wt 230.0 lb

## 2020-12-24 DIAGNOSIS — S61213A Laceration without foreign body of left middle finger without damage to nail, initial encounter: Secondary | ICD-10-CM

## 2020-12-24 DIAGNOSIS — G8929 Other chronic pain: Secondary | ICD-10-CM

## 2020-12-24 NOTE — Patient Instructions (Signed)
Wound Care, Adult Taking care of your wound properly can help to prevent pain, infection, and scarring. It can also help your wound heal more quickly. Follow instructionsfrom your health care provider about how to care for your wound. Supplies needed: Soap and water. Wound cleanser. Gauze. If needed, a clean bandage (dressing) or other type of wound dressing material to cover or place in the wound. Follow your health care provider's instructions about what dressing supplies to use. Cream or ointment to apply to the wound, if told by your health care provider. How to care for your wound Cleaning the wound Ask your health care provider how to clean the wound. This may include: Using mild soap and water or a wound cleanser. Using a clean gauze to pat the wound dry after cleaning it. Do not rub or scrub the wound. Dressing care Wash your hands with soap and water for at least 20 seconds before and after you change the dressing. If soap and water are not available, use hand sanitizer. Change your dressing as told by your health care provider. This may include: Cleaning or rinsing out (irrigating) the wound. Placing a dressing over the wound or in the wound (packing). Covering the wound with an outer dressing. Leave any stitches (sutures), skin glue, or adhesive strips in place. These skin closures may need to stay in place for 2 weeks or longer. If adhesive strip edges start to loosen and curl up, you may trim the loose edges. Do not remove adhesive strips completely unless your health care provider tells you to do that. Ask your health care provider when you can leave the wound uncovered. Checking for infection Check your wound area every day for signs of infection. Check for: More redness, swelling, or pain. Fluid or blood. Warmth. Pus or a bad smell.  Follow these instructions at home Medicines If you were prescribed an antibiotic medicine, cream, or ointment, take or apply it as told by  your health care provider. Do not stop using the antibiotic even if your condition improves. If you were prescribed pain medicine, take it 30 minutes before you do any wound care or as told by your health care provider. Take over-the-counter and prescription medicines only as told by your health care provider. Eating and drinking Eat a diet that includes protein, vitamin A, vitamin C, and other nutrient-rich foods to help the wound heal. Foods rich in protein include meat, fish, eggs, dairy, beans, and nuts. Foods rich in vitamin A include carrots and dark green, leafy vegetables. Foods rich in vitamin C include citrus fruits, tomatoes, broccoli, and peppers. Drink enough fluid to keep your urine pale yellow. General instructions Do not take baths, swim, use a hot tub, or do anything that would put the wound underwater until your health care provider approves. Ask your health care provider if you may take showers. You may only be allowed to take sponge baths. Do not scratch or pick at the wound. Keep it covered as told by your health care provider. Return to your normal activities as told by your health care provider. Ask your health care provider what activities are safe for you. Protect your wound from the sun when you are outside for the first 6 months, or for as long as told by your health care provider. Cover up the scar area or apply sunscreen that has an SPF of at least 30. Do not use any products that contain nicotine or tobacco, such as cigarettes, e-cigarettes, and chewing tobacco.   These may delay wound healing. If you need help quitting, ask your health care provider. Keep all follow-up visits as told by your health care provider. This is important. Contact a health care provider if: You received a tetanus shot and you have swelling, severe pain, redness, or bleeding at the injection site. Your pain is not controlled with medicine. You have any of these signs of infection: More  redness, swelling, or pain around the wound. Fluid or blood coming from the wound. Warmth coming from the wound. Pus or a bad smell coming from the wound. A fever or chills. You are nauseous or you vomit. You are dizzy. Get help right away if: You have a red streak of skin near the area around your wound. Your wound has been closed with staples, sutures, skin glue, or adhesive strips and it begins to open up and separate. Your wound is bleeding, and the bleeding does not stop with gentle pressure. You have a rash. You faint. You have trouble breathing. These symptoms may represent a serious problem that is an emergency. Do not wait to see if the symptoms will go away. Get medical help right away. Call your local emergency services (911 in the [place] Do not drive yourself to the hospital. Summary Always wash your hands with soap and water for at least 20 seconds before and after changing your dressing. Change your dressing as told by your health care provider. To help with healing, eat foods that are rich in protein, vitamin A, vitamin C, and other nutrients. Check your wound every day for signs of infection. Contact your health care provider if you suspect that your wound is infected. This information is not intended to replace advice given to you by your health care provider. Make sure you discuss any questions you have with your healthcare provider. Document Revised: 02/02/2019 Document Reviewed: 02/02/2019 Elsevier Patient Education  2022 Elsevier Inc.  

## 2020-12-24 NOTE — Progress Notes (Signed)
[name] 50 y.o.   Chief Complaint  Patient presents with   Laceration    Left middle finger, x friday    HISTORY OF PRESENT ILLNESS: This is a 50 y.o. male sustained laceration to left middle finger with a knife 5 days ago. Healing well.  No complications.  Up-to-date with tetanus vaccine.  Laceration     Prior to Admission medications   Medication Sig Start Date End Date Taking? Authorizing Provider  amLODipine (NORVASC) 10 MG tablet Take 1 tablet (10 mg total) by mouth daily. 11/05/20  Yes Mayers, Cari S, PA-C  ASPIRIN LOW DOSE 81 MG EC tablet TAKE ONE TABLET BY MOUTH DAILY Patient taking differently: Take 81 mg by mouth daily. 05/30/20  Yes Claiborne Rigg, NP  atorvastatin (LIPITOR) 20 MG tablet Take 1 tablet (20 mg total) by mouth daily. 11/05/20  Yes Mayers, Cari S, PA-C  COVID-19 mRNA vaccine, Pfizer, 30 MCG/0.3ML injection Inject into the muscle. Patient taking differently: Inject 0.3 mLs into the muscle once. 08/05/20  Yes Judyann Munson, MD  fluticasone [facility]) 50 MCG/ACT nasal spray Place 2 sprays into both nostrils daily. 10/08/20  Yes CovingtonMaralyn Sago M, PA-C  glucose blood test strip Use as instructed. Inject into the skin once daily. E11.65 Patient taking differently: 1 each by Other route as needed for other (Glucose reading). Use as instructed. Inject into the skin once daily. E11.65 11/05/20  Yes Mayers, Cari S, PA-C  guaiFENesin (MUCINEX) 600 MG 12 hr tablet Take 2 tablets by mouth in the morning and at bedtime. 10/09/20 10/09/21 Yes [provider]  JARDIANCE 25 MG TABS tablet Take 1 tablet (25 mg total) by mouth daily. 11/05/20  Yes Mayers, Cari S, PA-C  lisinopril (ZESTRIL) 20 MG tablet Take 1 tablet (20 mg total) by mouth daily. 11/05/20  Yes Mayers, Cari S, PA-C  loratadine (CLARITIN REDITABS) 10 MG dissolvable tablet Take 1 tablet (10 mg total) by mouth daily. 12/13/18  Yes McClung, Angela M, PA-C  metFORMIN (GLUCOPHAGE) 500 MG tablet TAKE ONE TABLET BY  MOUTH TWICE A DAY WITH A MEAL; STOP TAKING JENTADUETO ! 12/04/20  Yes Mayers, Cari S, PA-C    Allergies  Allergen Reactions   Pollen Extract Other (See Comments)    Patient Active Problem List   Diagnosis Date Noted   Preop cardiovascular exam 11/21/2020   Feces contents abnormal 11/21/2020   History of colonic polyps 11/21/2020   Hyperlipidemia 04/23/2020   Class 2 severe obesity due to excess calories with serious comorbidity and body mass index (BMI) of 37.0 to 37.9 in adult (HCC) 02/13/2019   Sleep apnea 01/23/2019   Chronic diastolic heart failure (HCC) 11/07/2016   OSA (obstructive sleep apnea) 04/18/2012   Left ventricular diastolic dysfunction, NYHA class 1/moderate LVH with concentric hypertrophy 09/03/2011   ? DVT of popliteal vein, right (non occlusive) 09/03/2011   Pulmonary embolism (HCC) 09/02/2011   Dyslipidemia associated with type 2 diabetes mellitus (HCC) 09/02/2011   Hypertension associated with diabetes (HCC) 09/02/2011    Past Medical History:  Diagnosis Date   Carpal tunnel syndrome    Chronic migraine 01/23/2019   Diabetes mellitus    Diastolic heart failure (HCC)    Hypertension    Obesity    Pulmonary embolism (HCC) 2013   He has a history of HTN.  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 A repeat scan done in 2017 did not demonstrate evidence of emboli. Neither study demonstrated any coronary  calcifications.    Shoulder pain, bilateral    Sleep apnea    was fitted for mask and never got machine - too costly    Past Surgical History:  Procedure Laterality Date   CARPAL TUNNEL RELEASE Right 12/04/2015   Procedure: RIGHT CARPAL TUNNEL RELEASE;  Surgeon: Betha Loa, MD;  Location: [facility];  Service: Orthopedics;  Laterality: Right;  RIGHT CARPAL TUNNEL RELEASE   CARPAL TUNNEL RELEASE Right 12/2015   [facility]   CARPAL TUNNEL RELEASE Left 04/01/2016   Procedure: LEFT CARPAL TUNNEL RELEASE;  Surgeon: Betha Loa, MD;  Location: [facility];  Service: Orthopedics;  Laterality: Left;   right hand surgery     ROTATOR CUFF REPAIR      Social History   Socioeconomic History   Marital status: Married    Spouse name: Not on file   Number of children: Not on file   Years of education: 12+   Highest education level: Not on file  Occupational History   Occupation: real estate agent  Tobacco Use   Smoking status: Never   Smokeless tobacco: Never  Vaping Use   Vaping Use: Never used  Substance and Sexual Activity   Alcohol use: No    Alcohol/week: 0.0 standard drinks   Drug use: No   Sexual activity: Yes  Other Topics Concern   Not on file  Social History Narrative   Lives at home.   Right-handed.   Occasional caffeine use.   Tea every other day   Social Determinants of Health   Financial Resource Strain: Not on file  Food Insecurity: Not on file  Transportation Needs: Not on file  Physical Activity: Not on file  Stress: Not on file  Social Connections: Not on file  Intimate Partner Violence: Not on file    Family History  Problem Relation Age of Onset   Hypertension Mother    Kidney disease Father    Hypertension Other    Hyperlipidemia Other    Diabetes Other      Review of Systems  Constitutional: Negative.  Negative for chills and fever.  HENT:  Negative for congestion and sore throat.   Respiratory: Negative.  Negative for cough and shortness of breath.   Cardiovascular: Negative.  Negative for chest pain and palpitations.  Gastrointestinal:  Negative for abdominal pain, diarrhea, nausea and vomiting.  Skin:        Positive finger laceration  Neurological: Negative.  Negative for dizziness and headaches.  All other systems reviewed and are negative.   Physical Exam Vitals reviewed.  Constitutional:      Appearance: Normal appearance.  HENT:     Head: Normocephalic.  Cardiovascular:     Rate and Rhythm: Normal rate.  Pulmonary:      Effort: Pulmonary effort is normal.  Skin:    General: Skin is warm and dry.     Comments: Left middle finger: Small laceration to distal aspect about 2 cm long.  Healing well.  No signs of infection.  No complications.  Neurological:     General: No focal deficit present.     Mental Status: He is alert and oriented to person, place, and time.  Psychiatric:        Mood and Affect: Mood normal.        Behavior: Behavior normal.     ASSESSMENT & PLAN: Momen was seen today for laceration.  Diagnoses and all orders for this visit:  Laceration of left middle  finger without foreign body without damage to nail, initial encounter  Healing very well without complications or secondary bacterial infection.  Wound care discussed with patient. Up-to-date with tetanus vaccination. Advised to contact the office if no better or worse during the next several days.  Patient Instructions  Wound Care, Adult Taking care of your wound properly can help to prevent pain, infection, and scarring. It can also help your wound heal more quickly. Follow instructionsfrom your health care provider about how to care for your wound. Supplies needed: Soap and water. Wound cleanser. Gauze. If needed, a clean bandage (dressing) or other type of wound dressing material to cover or place in the wound. Follow your health care provider's instructions about what dressing supplies to use. Cream or ointment to apply to the wound, if told by your health care provider. How to care for your wound Cleaning the wound Ask your health care provider how to clean the wound. This may include: Using mild soap and water or a wound cleanser. Using a clean gauze to pat the wound dry after cleaning it. Do not rub or scrub the wound. Dressing care Wash your hands with soap and water for at least 20 seconds before and after you change the dressing. If soap and water are not available, use hand sanitizer. Change your dressing as told  by your health care provider. This may include: Cleaning or rinsing out (irrigating) the wound. Placing a dressing over the wound or in the wound (packing). Covering the wound with an outer dressing. Leave any stitches (sutures), skin glue, or adhesive strips in place. These skin closures may need to stay in place for 2 weeks or longer. If adhesive strip edges start to loosen and curl up, you may trim the loose edges. Do not remove adhesive strips completely unless your health care provider tells you to do that. Ask your health care provider when you can leave the wound uncovered. Checking for infection Check your wound area every day for signs of infection. Check for: More redness, swelling, or pain. Fluid or blood. Warmth. Pus or a bad smell.  Follow these instructions at home Medicines If you were prescribed an antibiotic medicine, cream, or ointment, take or apply it as told by your health care provider. Do not stop using the antibiotic even if your condition improves. If you were prescribed pain medicine, take it 30 minutes before you do any wound care or as told by your health care provider. Take over-the-counter and prescription medicines only as told by your health care provider. Eating and drinking Eat a diet that includes protein, vitamin A, vitamin C, and other nutrient-rich foods to help the wound heal. Foods rich in protein include meat, fish, eggs, dairy, beans, and nuts. Foods rich in vitamin A include carrots and dark green, leafy vegetables. Foods rich in vitamin C include citrus fruits, tomatoes, broccoli, and peppers. Drink enough fluid to keep your urine pale yellow. General instructions Do not take baths, swim, use a hot tub, or do anything that would put the wound underwater until your health care provider approves. Ask your health care provider if you may take showers. You may only be allowed to take sponge baths. Do not scratch or pick at the wound. Keep it covered  as told by your health care provider. Return to your normal activities as told by your health care provider. Ask your health care provider what activities are safe for you. Protect your wound from the sun when you are  outside for the first 6 months, or for as long as told by your health care provider. Cover up the scar area or apply sunscreen that has an SPF of at least 30. Do not use any products that contain nicotine or tobacco, such as cigarettes, e-cigarettes, and chewing tobacco. These may delay wound healing. If you need help quitting, ask your health care provider. Keep all follow-up visits as told by your health care provider. This is important. Contact a health care provider if: You received a tetanus shot and you have swelling, severe pain, redness, or bleeding at the injection site. Your pain is not controlled with medicine. You have any of these signs of infection: More redness, swelling, or pain around the wound. Fluid or blood coming from the wound. Warmth coming from the wound. Pus or a bad smell coming from the wound. A fever or chills. You are nauseous or you vomit. You are dizzy. Get help right away if: You have a red streak of skin near the area around your wound. Your wound has been closed with staples, sutures, skin glue, or adhesive strips and it begins to open up and separate. Your wound is bleeding, and the bleeding does not stop with gentle pressure. You have a rash. You faint. You have trouble breathing. These symptoms may represent a serious problem that is an emergency. Do not wait to see if the symptoms will go away. Get medical help right away. Call your local emergency services (911 in the [place] Do not drive yourself to the hospital. Summary Always wash your hands with soap and water for at least 20 seconds before and after changing your dressing. Change your dressing as told by your health care provider. To help with healing, eat foods that are rich in  protein, vitamin A, vitamin C, and other nutrients. Check your wound every day for signs of infection. Contact your health care provider if you suspect that your wound is infected. This information is not intended to replace advice given to you by your health care provider. Make sure you discuss any questions you have with your healthcare provider. Document Revised: 02/02/2019 Document Reviewed: 02/02/2019 Elsevier Patient Education  2022 Elsevier Inc.    Edwina BarthMiguel Yliana Gravois, MD [facility]

## 2021-01-01 ENCOUNTER — Ambulatory Visit (INDEPENDENT_AMBULATORY_CARE_PROVIDER_SITE_OTHER): Payer: 59 | Admitting: Orthopedic Surgery

## 2021-01-01 ENCOUNTER — Encounter: Payer: Self-pay | Admitting: Orthopedic Surgery

## 2021-01-01 ENCOUNTER — Ambulatory Visit: Payer: Self-pay

## 2021-01-01 ENCOUNTER — Other Ambulatory Visit: Payer: Self-pay

## 2021-01-01 VITALS — Ht 66.0 in | Wt 230.2 lb

## 2021-01-01 DIAGNOSIS — M79672 Pain in left foot: Secondary | ICD-10-CM | POA: Diagnosis not present

## 2021-01-02 ENCOUNTER — Telehealth: Payer: Self-pay | Admitting: Orthopedic Surgery

## 2021-01-02 NOTE — Telephone Encounter (Signed)
Patient called to let you know his A1c is 8.0 and he is ready to schedule his surgery.  Please provide surgery sheet.  Thanks.

## 2021-01-05 ENCOUNTER — Encounter: Payer: Self-pay | Admitting: Orthopedic Surgery

## 2021-01-05 NOTE — Progress Notes (Signed)
Office Visit Note   [name]           Date of Birth: 30-Aug-1970           MRN: [id number] Visit Date: 01/01/2021 Requested by: Lenda Kelp, MD [address] [place],  [place] [address] PCP: Georgina Quint, MD  Subjective: Chief Complaint  Patient presents with   Left Knee - Follow-up    HPI: Scott [name] is a 50 year old patient with left knee and left foot pain.  He had an injection in his left knee last month which helped.  He is taking over-the-counter Aleve occasionally for pain.  Denies any fall or injury which started the pain.  He has had no prior knee surgeries.  Localizes the pain primarily to the anterior aspect of the knee.  He has had 3 cortisone injections into the knee which has helped some.  Reports some mechanical symptoms and some difficulty with stairs.  Denies any frank instability.  Patient also reports left foot pain.  He has an area on the lateral aspect around the calcaneus which has formed a callus and bursal inflammation.  Makes it difficult for him to wear shoes.  It is right at the lateral attachment of the Achilles tendon into the calcaneal region.  This is been going on for about 4 to 5 months.  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  Notably the patient had DVT in that left lower extremity in 2015.  He was on anticoagulants for a year but has not required any anticoagulants since then.  He does have a cardiac history.  He has had an MRI scan of the left knee which is reviewed.  I showed him the images.  He does have a slightly elongated meniscal cyst on the posterior medial aspect of that medial meniscus.  No discrete meniscal tear is visible.  He has fairly well-maintained medial and lateral compartment articular cartilage with some partial thickness wear on the backside of the patella.  By report patient has also had MRI of the left foot which was normal.  He is also had MRI of the ankle which we do not have access  to.              ROS: All systems reviewed are negative as they relate to the chief complaint within the history of present illness.  Patient denies  fevers or chills.   Assessment & Plan: Visit Diagnoses:  1. Pain in left foot     Plan: Impression is left knee medial meniscal cyst with possible occult meniscal tear.  Patient also has fairly prominent bone spur on the lateral aspect of the Achilles tendon attachment.  This is examined under ultrasound and is consistent with a bony spike.  Not really in the substance of the tendon itself but more adjacent to the tendon laterally at its calcaneal attachment.  Plan at this time after much discussion would be knee arthroscopy with evaluation of that medial meniscus and possible decompression of the cyst through a superior medial portal.  At the same time we would want to address this spur on the foot.  His hemoglobin A1c is slightly elevated and when that gets to be under 8 we can proceed with surgical intervention for both at the same time.  Would put him on Xarelto and begin immediate weightbearing with ultrasound at the first postop visit to rule out DVT.  The risk and benefits of the  procedure are discussed with the patient including but not limited to knee stiffness and incomplete pain relief for the left knee as well as infection and persistent swelling on that left foot.  Patient understands risk benefits and wishes to proceed.  I gave him Debbie's card and he will call once his hemoglobin A1c is less than 8.  Currently it is 8.4.  Follow-Up Instructions: Return in about 6 weeks (around 02/12/2021).   Orders:  Orders Placed This Encounter  Procedures   XR Foot Complete Left   No orders of the defined types were placed in this encounter.     Procedures: No procedures performed   Clinical Data: No additional findings.  Objective: Vital Signs: Ht 5\' 6"  (1.676 m)   Wt 230 lb 3.2 oz (104.4 kg)   BMI 37.16 kg/m   Physical Exam:    Constitutional: Patient appears well-developed HEENT:  Head: Normocephalic Eyes:EOM are normal Neck: Normal range of motion Cardiovascular: Normal rate Pulmonary/chest: Effort normal Neurologic: Patient is alert Skin: Skin is warm Psychiatric: Patient has normal mood and affect   Ortho Exam: Ortho exam demonstrates normal gait alignment.  Left foot is examined.  He has symmetric tibiotalar subtalar transverse tarsal range of motion with palpable intact nontender anterior to posterior to peroneal and Achilles tendons.  Pedal pulses palpable.  He does have a spur at the lateral attachment site on the Achilles tendon.  The spur measures about 4 x 4 mm with an overlying bursal sac and callus type tissue present.  That whole elevation measures about 1-1/2 x 1/2 x 4 mm.  This would be prominent and adverse to comfortable shoe wear.  Examination of the left knee demonstrates mild joint line tenderness medially with some  Macular tenderness.  Range of motion is full with stable collateral and cruciate ligaments.  No groin pain with internal and external rotation of the leg.  Specialty Comments:  No specialty comments available.  Imaging: No results found.   PMFS History: Patient Active Problem List   Diagnosis Date Noted   Preop cardiovascular exam 11/21/2020   Feces contents abnormal 11/21/2020   History of colonic polyps 11/21/2020   Hyperlipidemia 04/23/2020   Class 2 severe obesity due to excess calories with serious comorbidity and body mass index (BMI) of 37.0 to 37.9 in adult [facility]) 02/13/2019   Sleep apnea 01/23/2019   Chronic diastolic heart failure (HCC) 11/07/2016   OSA (obstructive sleep apnea) 04/18/2012   Left ventricular diastolic dysfunction, NYHA class 1/moderate LVH with concentric hypertrophy 09/03/2011   ? DVT of popliteal vein, right (non occlusive) 09/03/2011   Pulmonary embolism (HCC) 09/02/2011   Dyslipidemia associated with type 2 diabetes mellitus (HCC)  09/02/2011   Hypertension associated with diabetes (HCC) 09/02/2011   Past Medical History:  Diagnosis Date   Carpal tunnel syndrome    Chronic migraine 01/23/2019   Diabetes mellitus    Diastolic heart failure (HCC)    Hypertension    Obesity    Pulmonary embolism (HCC) 2013   He has a history of HTN.  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 A repeat scan done in 2017 did not demonstrate evidence of emboli. Neither study demonstrated any coronary calcifications.    Shoulder pain, bilateral    Sleep apnea    was fitted for mask and never got machine - too costly    Family History  Problem Relation Age of Onset   Hypertension Mother    Kidney disease  Father    Hypertension Other    Hyperlipidemia Other    Diabetes Other     Past Surgical History:  Procedure Laterality Date   CARPAL TUNNEL RELEASE Right 12/04/2015   Procedure: RIGHT CARPAL TUNNEL RELEASE;  Surgeon: Betha Loa, MD;  Location: [facility];  Service: Orthopedics;  Laterality: Right;  RIGHT CARPAL TUNNEL RELEASE   CARPAL TUNNEL RELEASE Right 12/2015   [facility]   CARPAL TUNNEL RELEASE Left 04/01/2016   Procedure: LEFT CARPAL TUNNEL RELEASE;  Surgeon: Betha Loa, MD;  Location: [facility];  Service: Orthopedics;  Laterality: Left;   right hand surgery     ROTATOR CUFF REPAIR     Social History   Occupational History   Occupation: real estate agent  Tobacco Use   Smoking status: Never   Smokeless tobacco: Never  Vaping Use   Vaping Use: Never used  Substance and Sexual Activity   Alcohol use: No    Alcohol/week: 0.0 standard drinks   Drug use: No   Sexual activity: Yes

## 2021-01-06 ENCOUNTER — Telehealth: Payer: Self-pay

## 2021-01-06 NOTE — Telephone Encounter (Signed)
Please advise as the pt has stated he has tested POS for COVID on 01/02/2021. Pt is not currently on meds for COVID and is wanting to know if he can take anything to help with sxs of Headache, nasal congestion, hot flashes and fatigue.

## 2021-01-07 ENCOUNTER — Other Ambulatory Visit: Payer: Self-pay | Admitting: Emergency Medicine

## 2021-01-07 DIAGNOSIS — U071 COVID-19: Secondary | ICD-10-CM

## 2021-01-07 MED ORDER — NIRMATRELVIR/RITONAVIR (PAXLOVID)TABLET: 3.0000 | ORAL_TABLET | Freq: Two times a day (BID) | ORAL | 0 refills | Status: AC

## 2021-01-07 NOTE — Telephone Encounter (Signed)
Please advise as the pt is calling back has stated he has tested POS for COVID on 01/02/2021. Pt is not currently on meds for COVID and is wanting to know if he can take anything to help with sxs of Headache, nasal congestion, hot flashes and fatigue.

## 2021-01-07 NOTE — Telephone Encounter (Signed)
When did his symptoms start?

## 2021-01-07 NOTE — Telephone Encounter (Signed)
He may still benefit from Paxlovid.  We will send it to his pharmacy of record. Also he should take Tylenol and or Advil for headaches.  Rest and fluids while it runs its course. Thanks

## 2021-01-08 NOTE — Telephone Encounter (Signed)
I was able to [facility] for pt of Dr. Latrelle Dodrill instructions a/w informing pt to please call the office back with any questions or concerns.

## 2021-01-19 ENCOUNTER — Other Ambulatory Visit: Payer: Self-pay | Admitting: Physician Assistant

## 2021-01-19 DIAGNOSIS — I10 Essential (primary) hypertension: Secondary | ICD-10-CM

## 2021-01-26 ENCOUNTER — Other Ambulatory Visit: Payer: Self-pay

## 2021-02-03 ENCOUNTER — Other Ambulatory Visit: Payer: Self-pay | Admitting: Physician Assistant

## 2021-02-03 DIAGNOSIS — E1165 Type 2 diabetes mellitus with hyperglycemia: Secondary | ICD-10-CM

## 2021-02-03 DIAGNOSIS — E782 Mixed hyperlipidemia: Secondary | ICD-10-CM

## 2021-02-03 DIAGNOSIS — Z794 Long term (current) use of insulin: Secondary | ICD-10-CM

## 2021-02-11 NOTE — Progress Notes (Addendum)
Surgical Instructions    Your procedure is scheduled on 02/19/21.  Report to [facility] Main Entrance "A" at 9:30 A.M., then check in with the Admitting office.  Call this number if you have problems the morning of surgery:  [phone]   If you have any questions prior to your surgery date call [phone]: Open Monday-Friday 8am-4pm    Remember:  Do not eat after midnight the night before your surgery  You may drink clear liquids until 8:30 the morning of your surgery.   Clear liquids allowed are: Water, Non-Citrus Juices (without pulp), Carbonated Beverages, Clear Tea, Black Coffee ONLY (NO MILK, CREAM OR POWDERED CREAMER of any kind), and Gatorade    Take these medicines the morning of surgery with A SIP OF WATER:  amLODipine (NORVASC) atorvastatin (LIPITOR)  As Needed: acetaminophen (TYLENOL)  fluticasone (FLONASE)  guaiFENesin (MUCINEX) loratadine (CLARITIN)    As of today, STOP taking any Aspirin (unless otherwise instructed by your surgeon) Aleve, Naproxen, Ibuprofen, Motrin, Advil, Goody's, BC's, all herbal medications, fish oil, and all vitamins.  WHAT DO I DO ABOUT MY DIABETES MEDICATION?   Do not take oral diabetes medicines (pills) the morning of surgery. DO NOT take metformin or Jardiance day of surey  DO NOT take Jardiance day before surgery.   HOW TO MANAGE YOUR DIABETES BEFORE AND AFTER SURGERY  Why is it important to control my blood sugar before and after surgery? Improving blood sugar levels before and after surgery helps healing and can limit problems. A way of improving blood sugar control is eating a healthy diet by:  Eating less sugar and carbohydrates  Increasing activity/exercise  Talking with your doctor about reaching your blood sugar goals High blood sugars (greater than 180 mg/dL) can raise your risk of infections and slow your recovery, so you will need to focus on controlling your diabetes during the weeks before surgery. Make sure  that the doctor who takes care of your diabetes knows about your planned surgery including the date and location.  How do I manage my blood sugar before surgery? Check your blood sugar at least 4 times a day, starting 2 days before surgery, to make sure that the level is not too high or low.  Check your blood sugar the morning of your surgery when you wake up and every 2 hours until you get to the Short Stay unit.  If your blood sugar is less than 70 mg/dL, you will need to treat for low blood sugar: Do not take insulin. Treat a low blood sugar (less than 70 mg/dL) with  cup of clear juice (cranberry or apple), 4 glucose tablets, OR glucose gel. Recheck blood sugar in 15 minutes after treatment (to make sure it is greater than 70 mg/dL). If your blood sugar is not greater than 70 mg/dL on recheck, call [phone] for further instructions. Report your blood sugar to the short stay nurse when you get to Short Stay.  If you are admitted to the hospital after surgery: Your blood sugar will be checked by the staff and you will probably be given insulin after surgery (instead of oral diabetes medicines) to make sure you have good blood sugar levels. The goal for blood sugar control after surgery is 80-180 mg/dL.             Do not wear jewelry Do not wear lotions, powders, colognes, or deodorant. Do not shave 48 hours prior to surgery.  Men may shave face and neck. Do not bring  valuables to the hospital.              [facility] is not responsible for any belongings or valuables.  Do NOT Smoke (Tobacco/Vaping)  24 hours prior to your procedure  If you use a CPAP at night, you may bring your mask for your overnight stay.   Contacts, glasses, hearing aids, dentures or partials may not be worn into surgery, please bring cases for these belongings   For patients admitted to the hospital, discharge time will be determined by your treatment team.   Patients discharged the day of surgery  will not be allowed to drive home, and someone needs to stay with them for 24 hours.  NO VISITORS WILL BE ALLOWED IN PRE-OP WHERE PATIENTS ARE PREPPED FOR SURGERY.  ONLY 1 SUPPORT PERSON MAY BE PRESENT IN THE WAITING ROOM WHILE YOU ARE IN SURGERY.  IF YOU ARE TO BE ADMITTED, ONCE YOU ARE IN YOUR ROOM YOU WILL BE ALLOWED TWO (2) VISITORS. 1 (ONE) VISITOR MAY STAY OVERNIGHT BUT MUST ARRIVE TO THE ROOM BY 8pm.  Minor children may have two parents present. Special consideration for safety and communication needs will be reviewed on a case by case basis.  Special instructions:    Oral Hygiene is also important to reduce your risk of infection.  Remember - BRUSH YOUR TEETH THE MORNING OF SURGERY WITH YOUR REGULAR TOOTHPASTE   [facility]- Preparing For Surgery  Before surgery, you can play an important role. Because skin is not sterile, your skin needs to be as free of germs as possible. You can reduce the number of germs on your skin by washing with CHG (chlorahexidine gluconate) Soap before surgery.  CHG is an antiseptic cleaner which kills germs and bonds with the skin to continue killing germs even after washing.     Please do not use if you have an allergy to CHG or antibacterial soaps. If your skin becomes reddened/irritated stop using the CHG.  Do not shave (including legs and underarms) for at least 48 hours prior to first CHG shower. It is OK to shave your face.  Please follow these instructions carefully.     Shower the NIGHT BEFORE SURGERY and the MORNING OF SURGERY with CHG Soap.   If you chose to wash your hair, wash your hair first as usual with your normal shampoo. After you shampoo, rinse your hair and body thoroughly to remove the shampoo.  Then [employer] and genitals (private parts) with your normal soap and rinse thoroughly to remove soap.  After that Use CHG Soap as you would any other liquid soap. You can apply CHG directly to the skin and wash gently with a scrungie or a clean  washcloth.   Apply the CHG Soap to your body ONLY FROM THE NECK DOWN.  Do not use on open wounds or open sores. Avoid contact with your eyes, ears, mouth and genitals (private parts). Wash Face and genitals (private parts)  with your normal soap.   Wash thoroughly, paying special attention to the area where your surgery will be performed.  Thoroughly rinse your body with warm water from the neck down.  DO NOT shower/wash with your normal soap after using and rinsing off the CHG Soap.  Pat yourself dry with a CLEAN TOWEL.  Wear CLEAN PAJAMAS to bed the night before surgery  Place CLEAN SHEETS on your bed the night before your surgery  DO NOT SLEEP WITH PETS.   Day of Surgery:  Take a shower with CHG soap. Wear Clean/Comfortable clothing the morning of surgery Do not apply any deodorants/lotions.   Remember to brush your teeth WITH YOUR REGULAR TOOTHPASTE.   Please read over the following fact sheets that you were given.

## 2021-02-12 ENCOUNTER — Other Ambulatory Visit: Payer: Self-pay

## 2021-02-12 ENCOUNTER — Encounter (HOSPITAL_COMMUNITY)
Admission: RE | Admit: 2021-02-12 | Discharge: 2021-02-12 | Disposition: A | Discharge status: Home / Self Care | Payer: 59 | Source: Ambulatory Visit | Attending: Orthopedic Surgery | Admitting: Orthopedic Surgery

## 2021-02-12 ENCOUNTER — Encounter (HOSPITAL_COMMUNITY): Payer: Self-pay

## 2021-02-12 DIAGNOSIS — E119 Type 2 diabetes mellitus without complications: Secondary | ICD-10-CM | POA: Diagnosis not present

## 2021-02-12 DIAGNOSIS — Z01812 Encounter for preprocedural laboratory examination: Secondary | ICD-10-CM | POA: Diagnosis present

## 2021-02-12 DIAGNOSIS — M23007 Cystic meniscus, unspecified meniscus, left knee: Secondary | ICD-10-CM | POA: Diagnosis not present

## 2021-02-12 DIAGNOSIS — Z86711 Personal history of pulmonary embolism: Secondary | ICD-10-CM | POA: Insufficient documentation

## 2021-02-12 DIAGNOSIS — Z79899 Other long term (current) drug therapy: Secondary | ICD-10-CM | POA: Diagnosis not present

## 2021-02-12 DIAGNOSIS — I110 Hypertensive heart disease with heart failure: Secondary | ICD-10-CM | POA: Diagnosis not present

## 2021-02-12 DIAGNOSIS — Z7982 Long term (current) use of aspirin: Secondary | ICD-10-CM | POA: Insufficient documentation

## 2021-02-12 DIAGNOSIS — Z7984 Long term (current) use of oral hypoglycemic drugs: Secondary | ICD-10-CM | POA: Insufficient documentation

## 2021-02-12 DIAGNOSIS — I509 Heart failure, unspecified: Secondary | ICD-10-CM | POA: Diagnosis not present

## 2021-02-12 DIAGNOSIS — G4733 Obstructive sleep apnea (adult) (pediatric): Secondary | ICD-10-CM | POA: Diagnosis not present

## 2021-02-12 HISTORY — DX: Gastro-esophageal reflux disease without esophagitis: K21.9

## 2021-02-12 HISTORY — DX: Unspecified osteoarthritis, unspecified site: M19.90

## 2021-02-12 LAB — CBC
HCT: 50.6 % (ref 39.0–52.0)
Hemoglobin: 15.9 g/dL (ref 13.0–17.0)
MCH: 24.9 pg — ABNORMAL LOW (ref 26.0–34.0)
MCHC: 31.4 g/dL (ref 30.0–36.0)
MCV: 79.2 fL — ABNORMAL LOW (ref 80.0–100.0)
Platelets: 261 10*3/uL (ref 150–400)
RBC: 6.39 MIL/uL — ABNORMAL HIGH (ref 4.22–5.81)
RDW: 15.1 % (ref 11.5–15.5)
WBC: 5 10*3/uL (ref 4.0–10.5)
nRBC: 0 % (ref 0.0–0.2)

## 2021-02-12 LAB — BASIC METABOLIC PANEL
Anion gap: 8 (ref 5–15)
BUN: 10 mg/dL (ref 6–20)
CO2: 25 mmol/L (ref 22–32)
Calcium: 9.3 mg/dL (ref 8.9–10.3)
Chloride: 105 mmol/L (ref 98–111)
Creatinine, Ser: 1.09 mg/dL (ref 0.61–1.24)
GFR, Estimated: >60 mL/min (ref >60)
Glucose, Bld: 203 mg/dL — ABNORMAL HIGH (ref 70–99)
Potassium: 4.2 mmol/L (ref 3.5–5.1)
Sodium: 138 mmol/L (ref 135–145)

## 2021-02-12 LAB — HEMOGLOBIN A1C
Hgb A1c MFr Bld: 7.9 % — ABNORMAL HIGH (ref 4.8–5.6)
Mean Plasma Glucose: 180.03 mg/dL

## 2021-02-12 LAB — GLUCOSE, CAPILLARY: Glucose-Capillary: 237 mg/dL — ABNORMAL HIGH (ref 70–99)

## 2021-02-12 NOTE — Progress Notes (Signed)
PCP - Dr. Edwina Barth Cardiologist - Dr. Mervin Hack MD once in July  PPM/ICD - n/a Device Orders - n/a Rep Notified - n/a  Chest x-ray - n/a EKG - 11/21/20 Stress Test - 10/2016 ECHO - 11/24/20 Cardiac Cath - denies  Sleep Study - 2014- Care Everywhere CPAP - No  CBG today- 237. Patient states this is high for him. He ate a steak biscuit, cereal, and drank juice this morning.  Checks Blood Sugar twice a day Normal Range- 110-120 per patient  Blood Thinner Instructions: n/a Aspirin Instructions: As of today stop taking Aspirin unless otherwise instructed by your surgeon.   ERAS Protcol - Yes PRE-SURGERY Ensure or G2- n/a  COVID TEST- Ambulatory Surgery   Anesthesia review: Yes.   Patient denies shortness of breath, fever, cough and chest pain at PAT appointment   All instructions explained to the patient, with a verbal understanding of the material. Patient agrees to go over the instructions while at home for a better understanding. Patient also instructed to self quarantine after being tested for COVID-19. The opportunity to ask questions was provided.

## 2021-02-13 NOTE — Progress Notes (Signed)
Anesthesia Chart Review:   Case: [id number] Date/Time: 02/19/21 1115   Procedures:      LEFT KNEE ARTHROSCOPY, MENISCAL CYST DECOMPRESSION (Left: Knee)     LEFT FOOT SPUR REMOVAL (Left: Foot)   Anesthesia type: General   Pre-op diagnosis: LEFT KNEEE MENISCAL CYST, LEFT FOOT SPUR   [facility] / [facility]   Surgeons: Cammy Copa, MD       DISCUSSION: Pt is 50 years old with hx diastolic HF, HTN, DM, PE, OSA (no cpap)   VS: BP (!) 128/91   Pulse 92   Temp 37 C (Oral)   Resp 17   Ht 5\' 6"  (1.676 m)   Wt 103.9 kg   SpO2 98%   BMI 36.96 kg/m   PROVIDERS: - PCP is , MD - Saw cardiologist Georgina Quint, MD for pre-op eval 11/21/20 who cleared for surgery  LABS:  - Glucose 203 - HbA1c 7.9  (all labs ordered are listed, but only abnormal results are displayed)  Labs Reviewed  GLUCOSE, CAPILLARY - Abnormal; Notable for the following components:      Result Value   Glucose-Capillary 237 (*)    All other components within normal limits  CBC - Abnormal; Notable for the following components:   RBC 6.39 (*)    MCV 79.2 (*)    MCH 24.9 (*)    All other components within normal limits  BASIC METABOLIC PANEL - Abnormal; Notable for the following components:   Glucose, Bld 203 (*)    All other components within normal limits  HEMOGLOBIN A1C - Abnormal; Notable for the following components:   Hgb A1c MFr Bld 7.9 (*)    All other components within normal limits    EKG 11/21/20: NSR   CV: Echo 11/24/20:  1. Left ventricular ejection fraction, by estimation, is 55 to 60%. The left ventricle has normal function. The left ventricle has no regional wall motion abnormalities. There is moderate asymmetric left ventricular hypertrophy of the septal and basal-septal segments. Left ventricular diastolic parameters were normal.   2. Right ventricular systolic function is normal. The right ventricular size is normal. Tricuspid regurgitation signal is  inadequate for assessing PA pressure.   3. The mitral valve is normal in structure. Trivial mitral valve regurgitation. No evidence of mitral stenosis.   4. The aortic valve is tricuspid. Aortic valve regurgitation is not visualized. No aortic stenosis is present.   CT coronary morphology 02/16/18:  1. Coronary calcium score of 0. This was 0 percentile for age and sex matched control. 2. Normal coronary origin with left dominance. 3. No evidence of CAD.   Nuclear stress test 11/15/16:  Nuclear stress EF: 58%. Blood pressure demonstrated a normal response to exercise. There was no ST segment deviation noted during stress. The study is normal. This is a low risk study. The left ventricular ejection fraction is normal (55-65%).   Past Medical History:  Diagnosis Date   Arthritis    Carpal tunnel syndrome    Chronic migraine 01/23/2019   Diabetes mellitus    Diastolic heart failure (HCC)    GERD (gastroesophageal reflux disease)    Hypertension    Obesity    Pulmonary embolism (HCC) 2013   He has a history of HTN.  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 A repeat scan done in 2017 did not demonstrate evidence of emboli. Neither study demonstrated any coronary calcifications.    Shoulder pain,  bilateral    Sleep apnea    was fitted for mask and never got machine - too costly    Past Surgical History:  Procedure Laterality Date   CARPAL TUNNEL RELEASE Right 12/04/2015   Procedure: RIGHT CARPAL TUNNEL RELEASE;  Surgeon: Betha Loa, MD;  Location: [facility];  Service: Orthopedics;  Laterality: Right;  RIGHT CARPAL TUNNEL RELEASE   CARPAL TUNNEL RELEASE Right 12/2015   [facility]   CARPAL TUNNEL RELEASE Left 04/01/2016   Procedure: LEFT CARPAL TUNNEL RELEASE;  Surgeon: Betha Loa, MD;  Location: [facility];  Service: Orthopedics;  Laterality: Left;   right hand surgery     ROTATOR CUFF REPAIR Right     MEDICATIONS:   acetaminophen (TYLENOL) 325 MG tablet   amLODipine (NORVASC) 10 MG tablet   ASPIRIN LOW DOSE 81 MG EC tablet   atorvastatin (LIPITOR) 20 MG tablet   COVID-19 mRNA vaccine, Pfizer, 30 MCG/0.3ML injection   fluticasone (FLONASE) 50 MCG/ACT nasal spray   glucose blood test strip   guaiFENesin (MUCINEX) 600 MG 12 hr tablet   JARDIANCE 25 MG TABS tablet   lisinopril (ZESTRIL) 20 MG tablet   loratadine (CLARITIN REDITABS) 10 MG dissolvable tablet   loratadine (CLARITIN) 10 MG tablet   metFORMIN (GLUCOPHAGE) 500 MG tablet   Multiple Vitamin (MULTIVITAMIN WITH MINERALS) TABS tablet   trolamine salicylate (BLUE-EMU HEMP) 10 % cream   zinc gluconate 50 MG tablet   No current facility-administered medications for this encounter.    If glucose acceptable day of surgery, I anticipate pt can proceed with surgery as scheduled.  Rica Mast, PhD, FNP-BC [facility]/Anesthesiology Phone: [phone] 02/13/2021 10:44 AM

## 2021-02-13 NOTE — Anesthesia Preprocedure Evaluation (Addendum)
Anesthesia Evaluation  Patient identified by MRN, date of birth, ID band Patient awake    Reviewed: Allergy & Precautions, NPO status , Patient's Chart, lab work & pertinent test results  History of Anesthesia Complications Negative for: history of anesthetic complications  Airway Mallampati: II  TM Distance: >3 FB Neck ROM: Full    Dental  (+) Teeth Intact, Dental Advisory Given   Pulmonary sleep apnea (does not use CPAP) ,    breath sounds clear to auscultation       Cardiovascular hypertension, Pt. on medications (-) angina Rhythm:Regular Rate:Normal  10/2020 ECHO: EF 55-60%, normal LVF with no wall motion abnormalities, mod Asymmetric septal hypertrophy, no significant valvular abnormalities  '18 Stress: Nuclear stress EF: 58%. Blood pressure demonstrated a normal response to exercise.  no ST segment deviation noted during stress.  The study is normal.  This is a low risk study.  The left ventricular EF is normal (55-65%).   Neuro/Psych  Headaches,    GI/Hepatic Neg liver ROS, GERD  Controlled,  Endo/Other  diabetes (glu 140), Oral Hypoglycemic AgentsMorbid obesity  Renal/GU negative Renal ROS     Musculoskeletal  (+) Arthritis ,   Abdominal (+) + obese,   Peds  Hematology negative hematology ROS (+)   Anesthesia Other Findings   Reproductive/Obstetrics                           Anesthesia Physical Anesthesia Plan  ASA: 3  Anesthesia Plan: General   Post-op Pain Management:    Induction: Intravenous  PONV Risk Score and Plan: 2 and Ondansetron and Dexamethasone  Airway Management Planned: LMA  Additional Equipment: None  Intra-op Plan:   Post-operative Plan:   Informed Consent: I have reviewed the patients History and Physical, chart, labs and discussed the procedure including the risks, benefits and alternatives for the proposed anesthesia with the patient or  authorized representative who has indicated his/her understanding and acceptance.     Dental advisory given  Plan Discussed with: CRNA and Surgeon  Anesthesia Plan Comments: (See APP note by Joslyn Hy, FNP )      Anesthesia Quick Evaluation

## 2021-02-19 ENCOUNTER — Ambulatory Visit (HOSPITAL_COMMUNITY)
Admission: RE | Admit: 2021-02-19 | Discharge: 2021-02-19 | Disposition: A | Discharge status: Home / Self Care | Payer: 59 | Attending: Orthopedic Surgery | Admitting: Orthopedic Surgery

## 2021-02-19 ENCOUNTER — Encounter (HOSPITAL_COMMUNITY)
Admission: RE | Disposition: A | Discharge status: Home / Self Care | Payer: Self-pay | Source: Home / Self Care | Attending: Orthopedic Surgery

## 2021-02-19 ENCOUNTER — Ambulatory Visit (HOSPITAL_COMMUNITY): Payer: 59 | Admitting: Emergency Medicine

## 2021-02-19 ENCOUNTER — Ambulatory Visit (HOSPITAL_COMMUNITY): Payer: 59 | Admitting: Certified Registered Nurse Anesthetist

## 2021-02-19 ENCOUNTER — Encounter (HOSPITAL_COMMUNITY): Payer: Self-pay | Admitting: Orthopedic Surgery

## 2021-02-19 ENCOUNTER — Other Ambulatory Visit: Payer: Self-pay

## 2021-02-19 DIAGNOSIS — S83242D Other tear of medial meniscus, current injury, left knee, subsequent encounter: Secondary | ICD-10-CM | POA: Diagnosis not present

## 2021-02-19 DIAGNOSIS — Z86711 Personal history of pulmonary embolism: Secondary | ICD-10-CM | POA: Diagnosis not present

## 2021-02-19 DIAGNOSIS — Z7984 Long term (current) use of oral hypoglycemic drugs: Secondary | ICD-10-CM | POA: Insufficient documentation

## 2021-02-19 DIAGNOSIS — Z7982 Long term (current) use of aspirin: Secondary | ICD-10-CM | POA: Insufficient documentation

## 2021-02-19 DIAGNOSIS — Z6837 Body mass index (BMI) 37.0-37.9, adult: Secondary | ICD-10-CM | POA: Diagnosis not present

## 2021-02-19 DIAGNOSIS — I110 Hypertensive heart disease with heart failure: Secondary | ICD-10-CM | POA: Insufficient documentation

## 2021-02-19 DIAGNOSIS — E119 Type 2 diabetes mellitus without complications: Secondary | ICD-10-CM | POA: Diagnosis not present

## 2021-02-19 DIAGNOSIS — S83242A Other tear of medial meniscus, current injury, left knee, initial encounter: Secondary | ICD-10-CM | POA: Insufficient documentation

## 2021-02-19 DIAGNOSIS — Z7901 Long term (current) use of anticoagulants: Secondary | ICD-10-CM | POA: Insufficient documentation

## 2021-02-19 DIAGNOSIS — I5032 Chronic diastolic (congestive) heart failure: Secondary | ICD-10-CM | POA: Diagnosis not present

## 2021-02-19 DIAGNOSIS — X58XXXA Exposure to other specified factors, initial encounter: Secondary | ICD-10-CM | POA: Diagnosis not present

## 2021-02-19 DIAGNOSIS — Z01818 Encounter for other preprocedural examination: Secondary | ICD-10-CM

## 2021-02-19 DIAGNOSIS — K219 Gastro-esophageal reflux disease without esophagitis: Secondary | ICD-10-CM | POA: Diagnosis not present

## 2021-02-19 DIAGNOSIS — G4730 Sleep apnea, unspecified: Secondary | ICD-10-CM | POA: Insufficient documentation

## 2021-02-19 DIAGNOSIS — M7732 Calcaneal spur, left foot: Secondary | ICD-10-CM | POA: Diagnosis not present

## 2021-02-19 HISTORY — PX: I & D EXTREMITY: SHX5045

## 2021-02-19 HISTORY — PX: KNEE ARTHROSCOPY WITH MEDIAL MENISECTOMY: SHX5651

## 2021-02-19 LAB — GLUCOSE, CAPILLARY
Glucose-Capillary: 140 mg/dL — ABNORMAL HIGH (ref 70–99)
Glucose-Capillary: 129 mg/dL — ABNORMAL HIGH (ref 70–99)

## 2021-02-19 SURGERY — ARTHROSCOPY, KNEE, WITH MEDIAL MENISCECTOMY
Anesthesia: General | Site: Knee | Laterality: Left

## 2021-02-19 MED ORDER — CLONIDINE HCL (ANALGESIA) 100 MCG/ML EP SOLN
EPIDURAL | Status: DC | PRN
  Administered 2021-02-19: 1 mL

## 2021-02-19 MED ORDER — MEPERIDINE HCL 25 MG/ML IJ SOLN: 6.2500 mg | INTRAMUSCULAR | Status: DC | PRN

## 2021-02-19 MED ORDER — RIVAROXABAN 10 MG PO TABS
10.0000 mg | ORAL_TABLET | Freq: Every day | ORAL | 0 refills | Status: DC
Start: 2021-02-19 — End: 2021-06-04

## 2021-02-19 MED ORDER — ONDANSETRON HCL 4 MG/2ML IJ SOLN
INTRAMUSCULAR | Status: AC
  Filled 2021-02-19: qty 2

## 2021-02-19 MED ORDER — OXYCODONE-ACETAMINOPHEN 5-325 MG PO TABS: 1.0000 | ORAL_TABLET | ORAL | 0 refills | Status: DC | PRN

## 2021-02-19 MED ORDER — BUPIVACAINE HCL (PF) 0.25 % IJ SOLN
INTRAMUSCULAR | Status: AC
  Filled 2021-02-19: qty 30

## 2021-02-19 MED ORDER — ONDANSETRON HCL 4 MG/2ML IJ SOLN
INTRAMUSCULAR | Status: DC | PRN
  Administered 2021-02-19: 4 mg via INTRAVENOUS

## 2021-02-19 MED ORDER — ACETAMINOPHEN 10 MG/ML IV SOLN
INTRAVENOUS | Status: DC | PRN
  Administered 2021-02-19: 1000 mg via INTRAVENOUS

## 2021-02-19 MED ORDER — PROPOFOL 10 MG/ML IV BOLUS
INTRAVENOUS | Status: AC
  Filled 2021-02-19: qty 20

## 2021-02-19 MED ORDER — GLYCOPYRROLATE PF 0.2 MG/ML IJ SOSY
PREFILLED_SYRINGE | INTRAMUSCULAR | Status: AC
  Filled 2021-02-19: qty 1

## 2021-02-19 MED ORDER — CHLORHEXIDINE GLUCONATE 0.12 % MT SOLN: 15.0000 mL | Freq: Once | OROMUCOSAL | Status: AC

## 2021-02-19 MED ORDER — CEFAZOLIN SODIUM-DEXTROSE 2-4 GM/100ML-% IV SOLN
INTRAVENOUS | Status: AC
  Filled 2021-02-19: qty 100

## 2021-02-19 MED ORDER — BUPIVACAINE HCL (PF) 0.25 % IJ SOLN: INTRAMUSCULAR | Status: DC | PRN

## 2021-02-19 MED ORDER — MORPHINE SULFATE (PF) 4 MG/ML IV SOLN
INTRAVENOUS | Status: DC | PRN
  Administered 2021-02-19: 8 mg

## 2021-02-19 MED ORDER — BUPIVACAINE-EPINEPHRINE (PF) 0.25% -1:200000 IJ SOLN
INTRAMUSCULAR | Status: DC | PRN
  Administered 2021-02-19: 10 mL via PERINEURAL
  Administered 2021-02-19: 20 mL via PERINEURAL

## 2021-02-19 MED ORDER — FENTANYL CITRATE (PF) 250 MCG/5ML IJ SOLN
INTRAMUSCULAR | Status: AC
  Filled 2021-02-19: qty 5

## 2021-02-19 MED ORDER — FENTANYL CITRATE (PF) 100 MCG/2ML IJ SOLN
INTRAMUSCULAR | Status: DC | PRN
  Administered 2021-02-19: 50 ug via INTRAVENOUS
  Administered 2021-02-19 (×2): 25 ug via INTRAVENOUS

## 2021-02-19 MED ORDER — DEXAMETHASONE SODIUM PHOSPHATE 10 MG/ML IJ SOLN
INTRAMUSCULAR | Status: AC
  Filled 2021-02-19: qty 1

## 2021-02-19 MED ORDER — LIDOCAINE HCL (CARDIAC) PF 100 MG/5ML IV SOSY
PREFILLED_SYRINGE | INTRAVENOUS | Status: DC | PRN
  Administered 2021-02-19: 40 mg via INTRAVENOUS

## 2021-02-19 MED ORDER — ORAL CARE MOUTH RINSE: 15.0000 mL | Freq: Once | OROMUCOSAL | Status: AC

## 2021-02-19 MED ORDER — OXYCODONE HCL 5 MG/5ML PO SOLN
5.0000 mg | Freq: Once | ORAL | Status: DC | PRN
Start: 2021-02-19 — End: 2021-02-19

## 2021-02-19 MED ORDER — PROPOFOL 10 MG/ML IV BOLUS
INTRAVENOUS | Status: DC | PRN
  Administered 2021-02-19: 200 mg via INTRAVENOUS

## 2021-02-19 MED ORDER — PROMETHAZINE HCL 25 MG/ML IJ SOLN: 6.2500 mg | INTRAMUSCULAR | Status: DC | PRN

## 2021-02-19 MED ORDER — MIDAZOLAM HCL 2 MG/2ML IJ SOLN: 0.5000 mg | Freq: Once | INTRAMUSCULAR | Status: DC | PRN

## 2021-02-19 MED ORDER — PHENYLEPHRINE 40 MCG/ML (10ML) SYRINGE FOR IV PUSH (FOR BLOOD PRESSURE SUPPORT)
PREFILLED_SYRINGE | INTRAVENOUS | Status: DC | PRN
  Administered 2021-02-19: 40 ug via INTRAVENOUS
  Administered 2021-02-19 (×6): 80 ug via INTRAVENOUS

## 2021-02-19 MED ORDER — BUPIVACAINE-EPINEPHRINE (PF) 0.25% -1:200000 IJ SOLN
INTRAMUSCULAR | Status: AC
  Filled 2021-02-19: qty 30

## 2021-02-19 MED ORDER — OXYCODONE HCL 5 MG PO TABS: 5.0000 mg | ORAL_TABLET | Freq: Once | ORAL | Status: DC | PRN

## 2021-02-19 MED ORDER — POVIDONE-IODINE 7.5 % EX SOLN
Freq: Once | CUTANEOUS | Status: AC
  Administered 2021-02-19: 1 via TOPICAL
  Filled 2021-02-19: qty 118

## 2021-02-19 MED ORDER — ACETAMINOPHEN 10 MG/ML IV SOLN
INTRAVENOUS | Status: AC
  Filled 2021-02-19: qty 100

## 2021-02-19 MED ORDER — DEXAMETHASONE SODIUM PHOSPHATE 4 MG/ML IJ SOLN
INTRAMUSCULAR | Status: DC | PRN
  Administered 2021-02-19: 5 mg via INTRAVENOUS

## 2021-02-19 MED ORDER — SODIUM CHLORIDE 0.9 % IR SOLN
Status: DC | PRN
  Administered 2021-02-19: 6000 mL

## 2021-02-19 MED ORDER — GLYCOPYRROLATE PF 0.2 MG/ML IJ SOSY
PREFILLED_SYRINGE | INTRAMUSCULAR | Status: DC | PRN
  Administered 2021-02-19: .2 mg via INTRAVENOUS

## 2021-02-19 MED ORDER — ACETAMINOPHEN 500 MG PO TABS: 1000.0000 mg | ORAL_TABLET | Freq: Once | ORAL | Status: DC

## 2021-02-19 MED ORDER — LIDOCAINE 2% (20 MG/ML) 5 ML SYRINGE
INTRAMUSCULAR | Status: AC
  Filled 2021-02-19: qty 5

## 2021-02-19 MED ORDER — METHOCARBAMOL 500 MG PO TABS: 500.0000 mg | ORAL_TABLET | Freq: Three times a day (TID) | ORAL | 0 refills | Status: DC | PRN

## 2021-02-19 MED ORDER — LACTATED RINGERS IV SOLN: INTRAVENOUS | Status: DC

## 2021-02-19 MED ORDER — 0.9 % SODIUM CHLORIDE (POUR BTL) OPTIME
TOPICAL | Status: DC | PRN
  Administered 2021-02-19: 1000 mL

## 2021-02-19 MED ORDER — MIDAZOLAM HCL 2 MG/2ML IJ SOLN
INTRAMUSCULAR | Status: AC
  Filled 2021-02-19: qty 2

## 2021-02-19 MED ORDER — POVIDONE-IODINE 10 % EX SWAB
2.0000 "application " | Freq: Once | CUTANEOUS | Status: AC
  Administered 2021-02-19: 2 via TOPICAL

## 2021-02-19 MED ORDER — CLONIDINE HCL (ANALGESIA) 100 MCG/ML EP SOLN
EPIDURAL | Status: AC
  Filled 2021-02-19: qty 10

## 2021-02-19 MED ORDER — MIDAZOLAM HCL 5 MG/5ML IJ SOLN
INTRAMUSCULAR | Status: DC | PRN
  Administered 2021-02-19: 2 mg via INTRAVENOUS

## 2021-02-19 MED ORDER — MORPHINE SULFATE (PF) 4 MG/ML IV SOLN
INTRAVENOUS | Status: AC
  Filled 2021-02-19: qty 2

## 2021-02-19 MED ORDER — PHENYLEPHRINE 40 MCG/ML (10ML) SYRINGE FOR IV PUSH (FOR BLOOD PRESSURE SUPPORT)
PREFILLED_SYRINGE | INTRAVENOUS | Status: AC
  Filled 2021-02-19: qty 10

## 2021-02-19 MED ORDER — CHLORHEXIDINE GLUCONATE 0.12 % MT SOLN
OROMUCOSAL | Status: AC
  Administered 2021-02-19: 15 mL via OROMUCOSAL
  Filled 2021-02-19: qty 15

## 2021-02-19 MED ORDER — CEFAZOLIN SODIUM-DEXTROSE 2-4 GM/100ML-% IV SOLN
2.0000 g | INTRAVENOUS | Status: AC
  Administered 2021-02-19: 2 g via INTRAVENOUS

## 2021-02-19 MED ORDER — HYDROMORPHONE HCL 1 MG/ML IJ SOLN: 0.2500 mg | INTRAMUSCULAR | Status: DC | PRN

## 2021-02-19 SURGICAL SUPPLY — 90 items
ALCOHOL 70% 16 OZ (MISCELLANEOUS) ×3 IMPLANT
BAG COUNTER SPONGE SURGICOUNT (BAG) ×3 IMPLANT
BAG SPNG CNTER NS LX DISP (BAG) ×2
BANDAGE ESMARK 6X9 LF (GAUZE/BANDAGES/DRESSINGS) IMPLANT
BLADE CLIPPER SURG (BLADE) ×1 IMPLANT
BLADE CUTTER GATOR 3.5 (BLADE) ×3 IMPLANT
BLADE SURG 15 STRL LF DISP TIS (BLADE) IMPLANT
BLADE SURG 15 STRL SS (BLADE) ×3
BNDG CMPR 9X6 STRL LF SNTH (GAUZE/BANDAGES/DRESSINGS)
BNDG CMPR MED 10X6 ELC LF (GAUZE/BANDAGES/DRESSINGS) ×2
BNDG COHESIVE 4X5 TAN STRL (GAUZE/BANDAGES/DRESSINGS) IMPLANT
BNDG ELASTIC 4X5.8 VLCR STR LF (GAUZE/BANDAGES/DRESSINGS) IMPLANT
BNDG ELASTIC 6X10 VLCR STRL LF (GAUZE/BANDAGES/DRESSINGS) ×1 IMPLANT
BNDG ELASTIC 6X5.8 VLCR STR LF (GAUZE/BANDAGES/DRESSINGS) IMPLANT
BNDG ESMARK 6X9 LF (GAUZE/BANDAGES/DRESSINGS)
BNDG GAUZE ELAST 4 BULKY (GAUZE/BANDAGES/DRESSINGS) IMPLANT
COOLER ICEMAN CLASSIC (MISCELLANEOUS) ×1 IMPLANT
COVER SURGICAL LIGHT HANDLE (MISCELLANEOUS) ×3 IMPLANT
CUFF TOURN SGL QUICK 18X4 (TOURNIQUET CUFF) IMPLANT
CUFF TOURN SGL QUICK 24 (TOURNIQUET CUFF)
CUFF TOURN SGL QUICK 34 (TOURNIQUET CUFF) ×3
CUFF TOURN SGL QUICK 42 (TOURNIQUET CUFF) IMPLANT
CUFF TRNQT CYL 24X4X16.5-23 (TOURNIQUET CUFF) IMPLANT
CUFF TRNQT CYL 34X4.125X (TOURNIQUET CUFF) IMPLANT
DRAPE ARTHROSCOPY W/POUCH 114 (DRAPES) ×3 IMPLANT
DRAPE HALF SHEET 40X57 (DRAPES) IMPLANT
DRAPE INCISE IOBAN 66X45 STRL (DRAPES) ×1 IMPLANT
DRAPE U-SHAPE 47X51 STRL (DRAPES) ×3 IMPLANT
DRSG PAD ABDOMINAL 8X10 ST (GAUZE/BANDAGES/DRESSINGS) IMPLANT
DRSG TEGADERM 2-3/8X2-3/4 SM (GAUZE/BANDAGES/DRESSINGS) ×2 IMPLANT
DRSG TEGADERM 4X4.75 (GAUZE/BANDAGES/DRESSINGS) ×3 IMPLANT
DURAPREP 26ML APPLICATOR (WOUND CARE) ×4 IMPLANT
ELECT CAUTERY BLADE 6.4 (BLADE) ×1 IMPLANT
ELECT REM PT RETURN 9FT ADLT (ELECTROSURGICAL) ×3
ELECTRODE REM PT RTRN 9FT ADLT (ELECTROSURGICAL) ×2 IMPLANT
EXCALIBUR 3.8MM X 13CM (MISCELLANEOUS) ×1 IMPLANT
GAUZE SPONGE 4X4 12PLY STRL (GAUZE/BANDAGES/DRESSINGS) ×2 IMPLANT
GAUZE XEROFORM 1X8 LF (GAUZE/BANDAGES/DRESSINGS) ×2 IMPLANT
GAUZE XEROFORM 5X9 LF (GAUZE/BANDAGES/DRESSINGS) IMPLANT
GLOVE SRG 8 PF TXTR STRL LF DI (GLOVE) ×2 IMPLANT
GLOVE SURG LTX SZ7 (GLOVE) ×3 IMPLANT
GLOVE SURG LTX SZ8 (GLOVE) ×3 IMPLANT
GLOVE SURG UNDER POLY LF SZ7 (GLOVE) ×3 IMPLANT
GLOVE SURG UNDER POLY LF SZ8 (GLOVE) ×3
GOWN STRL REUS W/ TWL LRG LVL3 (GOWN DISPOSABLE) ×4 IMPLANT
GOWN STRL REUS W/ TWL XL LVL3 (GOWN DISPOSABLE) ×2 IMPLANT
GOWN STRL REUS W/TWL LRG LVL3 (GOWN DISPOSABLE) ×6
GOWN STRL REUS W/TWL XL LVL3 (GOWN DISPOSABLE) ×3
HANDPIECE INTERPULSE COAX TIP (DISPOSABLE)
KIT BASIN OR (CUSTOM PROCEDURE TRAY) ×3 IMPLANT
KIT TURNOVER KIT B (KITS) ×3 IMPLANT
MANIFOLD NEPTUNE II (INSTRUMENTS) ×3 IMPLANT
NDL 18GX1X1/2 (RX/OR ONLY) (NEEDLE) IMPLANT
NEEDLE 18GX1X1/2 (RX/OR ONLY) (NEEDLE) ×6 IMPLANT
NEEDLE 22X1 1/2 (OR ONLY) (NEEDLE) ×3 IMPLANT
NS IRRIG 1000ML POUR BTL (IV SOLUTION) ×3 IMPLANT
PACK ARTHROSCOPY DSU (CUSTOM PROCEDURE TRAY) ×3 IMPLANT
PACK ORTHO EXTREMITY (CUSTOM PROCEDURE TRAY) ×3 IMPLANT
PAD ARMBOARD 7.5X6 YLW CONV (MISCELLANEOUS) ×6 IMPLANT
PAD CAST 4YDX4 CTTN HI CHSV (CAST SUPPLIES) IMPLANT
PAD COLD SHLDR WRAP-ON (PAD) ×1 IMPLANT
PADDING CAST COTTON 4X4 STRL (CAST SUPPLIES)
PADDING CAST COTTON 6X4 STRL (CAST SUPPLIES) ×2 IMPLANT
PENCIL BUTTON HOLSTER BLD 10FT (ELECTRODE) ×1 IMPLANT
PORT APPOLLO RF 90DEGREE MULTI (SURGICAL WAND) ×3 IMPLANT
PROBE APOLLO 90XL (SURGICAL WAND) ×1 IMPLANT
SET HNDPC FAN SPRY TIP SCT (DISPOSABLE) IMPLANT
SOL PREP POV-IOD 4OZ 10% (MISCELLANEOUS) ×3 IMPLANT
SPONGE T-LAP 18X18 ~~LOC~~+RFID (SPONGE) ×1 IMPLANT
SPONGE T-LAP 4X18 ~~LOC~~+RFID (SPONGE) ×3 IMPLANT
STOCKINETTE IMPERVIOUS 9X36 MD (GAUZE/BANDAGES/DRESSINGS) IMPLANT
SUT ETHILON 2 0 FS 18 (SUTURE) IMPLANT
SUT ETHILON 3 0 PS 1 (SUTURE) ×2 IMPLANT
SUT MENISCAL KIT (KITS) IMPLANT
SUT VIC AB 3-0 SH 27 (SUTURE) ×3
SUT VIC AB 3-0 SH 27X BRD (SUTURE) IMPLANT
SWAB CULTURE ESWAB REG 1ML (MISCELLANEOUS) IMPLANT
SWAB CULTURE LIQ STUART DBL (MISCELLANEOUS) IMPLANT
SYR 20ML ECCENTRIC (SYRINGE) ×3 IMPLANT
SYR 5ML LUER SLIP (SYRINGE) ×1 IMPLANT
SYR CONTROL 10ML LL (SYRINGE) IMPLANT
SYR TB 1ML LUER SLIP (SYRINGE) ×3 IMPLANT
TOWEL GREEN STERILE (TOWEL DISPOSABLE) ×3 IMPLANT
TOWEL GREEN STERILE FF (TOWEL DISPOSABLE) ×3 IMPLANT
TUBE CONNECTING 12X1/4 (SUCTIONS) ×3 IMPLANT
TUBING ARTHROSCOPY IRRIG 16FT (MISCELLANEOUS) ×3 IMPLANT
UNDERPAD 30X36 HEAVY ABSORB (UNDERPADS AND DIAPERS) ×2 IMPLANT
WAND 90 DEG TURBOVAC W/CORD (SURGICAL WAND) IMPLANT
WATER STERILE IRR 1000ML POUR (IV SOLUTION) ×2 IMPLANT
YANKAUER SUCT BULB TIP NO VENT (SUCTIONS) ×3 IMPLANT

## 2021-02-19 NOTE — Transfer of Care (Signed)
Immediate Anesthesia Transfer of Care Note  [name]  Procedure(s) Performed: LEFT KNEE ARTHROSCOPY, MENISCAL CYST DECOMPRESSION (Left: Knee) LEFT FOOT SPUR REMOVAL (Left: Foot)  Patient Location: PACU  Anesthesia Type:General  Level of Consciousness: drowsy, patient cooperative and responds to stimulation  Airway & Oxygen Therapy: Patient Spontanous Breathing and Patient connected to face mask oxygen  Post-op Assessment: Report given to RN and Post -op Vital signs reviewed and stable  Post vital signs: Reviewed and stable  Last Vitals:  Vitals Value Taken Time  BP 123/86 02/19/21 1437  Temp    Pulse 109 02/19/21 1438  Resp 25 02/19/21 1438  SpO2 98 % 02/19/21 1438  Vitals shown include unvalidated device data.  Last Pain:  Vitals:   02/19/21 1015  PainSc: 0-No pain      Patients Stated Pain Goal: 3 (02/19/21 1015)  Complications: No notable events documented.

## 2021-02-19 NOTE — Brief Op Note (Signed)
   02/19/2021  3:14 PM  PATIENT:  [name]  50 y.o. male  PRE-OPERATIVE DIAGNOSIS:  LEFT KNEEE MENISCAL CYST, LEFT FOOT SPUR  POST-OPERATIVE DIAGNOSIS:  LEFT KNEEE MENISCAL CYST, LEFT FOOT SPUR  PROCEDURE:  Procedure(s): LEFT KNEE ARTHROSCOPY, MENISCAL CYST DECOMPRESSION LEFT FOOT SPUR REMOVAL  SURGEON:  Surgeon(s): August Saucer, Corrie Mckusick, MD  ASSISTANT: magnant pa  ANESTHESIA:   general  EBL: 5 ml    Total I/O In: 1000 [I.V.:800; IV Piggyback:200] Out: 805 [Urine:775; Blood:30]  BLOOD ADMINISTERED: none  DRAINS: none   LOCAL MEDICATIONS USED:  marcaine mso4 clonidine SPECIMEN:  bone spur to path  COUNTS:  YES  TOURNIQUET:  * Missing tourniquet times found for documented tourniquets in log: [id number] *  DICTATION: .Other Dictation: Dictation Number [id number]  PLAN OF CARE: Discharge to home after PACU  PATIENT DISPOSITION:  PACU - hemodynamically stable

## 2021-02-19 NOTE — Anesthesia Procedure Notes (Signed)
Procedure Name: LMA Insertion Date/Time: 02/19/2021 12:58 PM Performed by: Shary Decamp, CRNA Pre-anesthesia Checklist: Patient identified, Patient being monitored, Timeout performed, Emergency Drugs available and Suction available Patient Re-evaluated:Patient Re-evaluated prior to induction Oxygen Delivery Method: Circle System Utilized Preoxygenation: Pre-oxygenation with 100% oxygen Induction Type: IV induction Ventilation: Mask ventilation without difficulty LMA Size: 5.0 Tube type: Oral Number of attempts: 1 Placement Confirmation: positive ETCO2 and breath sounds checked- equal and bilateral Tube secured with: Tape Dental Injury: Teeth and Oropharynx as per pre-operative assessment

## 2021-02-19 NOTE — H&P (Signed)
Scott [name] is an 50 y.o. male.   Chief Complaint: left knee and foot pain HPI: Scott [name] is a 51 year old patient with left knee and left foot pain.  He had an injection in his left knee last month which helped.  He is taking over-the-counter Aleve occasionally for pain.  Denies any fall or injury which started the pain.  He has had no prior knee surgeries.  Localizes the pain primarily to the anterior aspect of the knee.  He has had 3 cortisone injections into the knee which has helped some.  Reports some mechanical symptoms and some difficulty with stairs.  Denies any frank instability.  Patient also reports left foot pain.  He has an area on the lateral aspect around the calcaneus which has formed a callus and bursal inflammation.  Makes it difficult for him to wear shoes.  It is right at the lateral attachment of the Achilles tendon into the calcaneal region.  This is been going on for about 4 to 5 months.  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  Notably the patient had DVT in that left lower extremity in 2015.  He was on anticoagulants for a year but has not required any anticoagulants since then.  He does have a cardiac history.  He has had an MRI scan of the left knee which is reviewed.  I showed him the images.  He does have a slightly elongated meniscal cyst on the posterior medial aspect of that medial meniscus.  No discrete meniscal tear is visible.  He has fairly well-maintained medial and lateral compartment articular cartilage with some partial thickness wear on the backside of the patella.  By report patient has also had MRI of the left foot which was normal.  He is also had MRI of the ankle which we do not have access to.  Past Medical History:  Diagnosis Date   Arthritis    Carpal tunnel syndrome    Chronic migraine 01/23/2019   Diabetes mellitus    Diastolic heart failure (HCC)    GERD (gastroesophageal reflux disease)    Hypertension    Obesity    Pulmonary  embolism (HCC) 2013   He has a history of HTN.  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 A repeat scan done in 2017 did not demonstrate evidence of emboli. Neither study demonstrated any coronary calcifications.    Shoulder pain, bilateral    Sleep apnea    was fitted for mask and never got machine - too costly    Past Surgical History:  Procedure Laterality Date   CARPAL TUNNEL RELEASE Right 12/04/2015   Procedure: RIGHT CARPAL TUNNEL RELEASE;  Surgeon: Betha Loa, MD;  Location: [facility];  Service: Orthopedics;  Laterality: Right;  RIGHT CARPAL TUNNEL RELEASE   CARPAL TUNNEL RELEASE Right 12/2015   [facility]   CARPAL TUNNEL RELEASE Left 04/01/2016   Procedure: LEFT CARPAL TUNNEL RELEASE;  Surgeon: Betha Loa, MD;  Location: [facility];  Service: Orthopedics;  Laterality: Left;   right hand surgery     ROTATOR CUFF REPAIR Right     Family History  Problem Relation Age of Onset   Hypertension Mother    Kidney disease Father    Hypertension Other    Hyperlipidemia Other    Diabetes Other    Social History:  reports that he has never smoked. He has never used smokeless tobacco. He reports that  he does not drink alcohol and does not use drugs.  Allergies:  Allergies  Allergen Reactions   Pollen Extract Other (See Comments)    Medications Prior to Admission  Medication Sig Dispense Refill   acetaminophen (TYLENOL) 325 MG tablet Take 650 mg by mouth every 6 (six) hours as needed for moderate pain.     amLODipine (NORVASC) 10 MG tablet Take 1 tablet (10 mg total) by mouth daily. 90 tablet 0   ASPIRIN LOW DOSE 81 MG EC tablet TAKE ONE TABLET BY MOUTH DAILY 90 tablet 1   atorvastatin (LIPITOR) 20 MG tablet TAKE ONE TABLET BY MOUTH DAILY 30 tablet 0   fluticasone (FLONASE) 50 MCG/ACT nasal spray Place 2 sprays into both nostrils daily. (Patient taking differently: Place 2 sprays into both nostrils daily as needed for  allergies.) 16 mL 0   glucose blood test strip Use as instructed. Inject into the skin once daily. E11.65 (Patient taking differently: 1 each by Other route as needed for other (Glucose reading). Use as instructed. Inject into the skin once daily. E11.65) 200 each 12   guaiFENesin (MUCINEX) 600 MG 12 hr tablet Take 1,200 mg by mouth 2 (two) times daily as needed for cough or to loosen phlegm.     JARDIANCE 25 MG TABS tablet Take 1 tablet (25 mg total) by mouth daily. 30 tablet 0   lisinopril (ZESTRIL) 20 MG tablet TAKE ONE TABLET BY MOUTH DAILY 30 tablet 0   loratadine (CLARITIN) 10 MG tablet Take 10 mg by mouth daily as needed for allergies.     metFORMIN (GLUCOPHAGE) 500 MG tablet TAKE ONE TABLET BY MOUTH TWICE A DAY WITH A MEAL; STOP TAKING JENTADUETO 60 tablet 0   Multiple Vitamin (MULTIVITAMIN WITH MINERALS) TABS tablet Take 1 tablet by mouth daily.     trolamine salicylate (BLUE-EMU HEMP) 10 % cream Apply 1 application topically as needed for muscle pain.     zinc gluconate 50 MG tablet Take 50 mg by mouth daily.     COVID-19 mRNA vaccine, Pfizer, 30 MCG/0.3ML injection Inject into the muscle. (Patient not taking: No sig reported) 0.3 mL 0   loratadine (CLARITIN REDITABS) 10 MG dissolvable tablet Take 1 tablet (10 mg total) by mouth daily. (Patient not taking: No sig reported) 30 tablet 3    Results for orders placed or performed during the hospital encounter of 02/19/21 (from the past 48 hour(s))  Glucose, capillary     Status: Abnormal   Collection Time: 02/19/21  9:39 AM  Result Value Ref Range   Glucose-Capillary 140 (H) 70 - 99 mg/dL    Comment: Glucose reference range applies only to samples taken after fasting for at least 8 hours.   No results found.  Review of Systems  Musculoskeletal:  Positive for arthralgias.  All other systems reviewed and are negative.  Blood pressure (!) 179/101, pulse 75, temperature (!) 97 F (36.1 C), resp. rate 18, height 5\' 6"  (1.676 m), weight  104.3 kg, SpO2 98 %. Physical Exam Vitals reviewed.  HENT:     Head: Normocephalic.     Nose: Nose normal.     Mouth/Throat:     Mouth: Mucous membranes are moist.  Eyes:     Pupils: Pupils are equal, round, and reactive to light.  Cardiovascular:     Rate and Rhythm: Normal rate.     Pulses: Normal pulses.  Pulmonary:     Effort: Pulmonary effort is normal.  Abdominal:     General:  Abdomen is flat.  Musculoskeletal:     Cervical back: Normal range of motion.  Skin:    General: Skin is warm.     Capillary Refill: Capillary refill takes less than 2 seconds.  Neurological:     General: No focal deficit present.     Mental Status: He is alert.  Psychiatric:        Mood and Affect: Mood normal.   Ortho exam demonstrates normal gait alignment.  Left foot is examined.  He has symmetric tibiotalar subtalar transverse tarsal range of motion with palpable intact nontender anterior to posterior to peroneal and Achilles tendons.  Pedal pulses palpable.  He does have a spur at the lateral attachment site on the Achilles tendon.  The spur measures about 4 x 4 mm with an overlying bursal sac and callus type tissue present.  That whole elevation measures about 1-1/2 x 1/2 x 4 mm.  This would be prominent and adverse to comfortable shoe wear.  Examination of the left knee demonstrates mild joint line tenderness medially with some medial  tenderness.  Range of motion is full with stable collateral and cruciate ligaments.  No groin pain with internal and external rotation of the leg.    Assessment/Plan  Impression is left knee medial meniscal cyst with possible occult meniscal tear.  Patient also has fairly prominent bone spur on the lateral aspect of the Achilles tendon attachment.  This is examined under ultrasound and is consistent with a bony spike.  Not really in the substance of the tendon itself but more adjacent to the tendon laterally at its calcaneal attachment.  Plan at this time after much  discussion would be knee arthroscopy with evaluation of that medial meniscus and possible decompression of the cyst through a superior medial portal.  At the same time we would want to address this spur on the foot.  His hemoglobin A1c is slightly elevated and when that gets to be under 8 we can proceed with surgical intervention for both at the same time.  Would put him on Xarelto and begin immediate weightbearing with ultrasound at the first postop visit to rule out DVT.  The risk and benefits of the procedure are discussed with the patient including but not limited to knee stiffness and incomplete pain relief for the left knee as well as infection and persistent swelling on that left foot.  Patient understands risk benefits and wishes to proceed. Plan for surgery once A!C less than 8.  Burnard Bunting, MD 02/19/2021, 11:54 AM

## 2021-02-19 NOTE — Anesthesia Postprocedure Evaluation (Signed)
Anesthesia Post Note  Patient: MARCIA LEPERA  Procedure(s) Performed: LEFT KNEE ARTHROSCOPY, MENISCAL CYST DECOMPRESSION (Left: Knee) LEFT FOOT SPUR REMOVAL (Left: Foot)     Patient location during evaluation: PACU Anesthesia Type: General Level of consciousness: awake and alert, patient cooperative and oriented Pain management: pain level controlled Vital Signs Assessment: post-procedure vital signs reviewed and stable Respiratory status: spontaneous breathing, nonlabored ventilation and respiratory function stable Cardiovascular status: blood pressure returned to baseline and stable Postop Assessment: no apparent nausea or vomiting and adequate PO intake Anesthetic complications: no   No notable events documented.  Last Vitals:  Vitals:   02/19/21 1445 02/19/21 1452  BP:  124/79  Pulse: (!) 107 99  Resp: (!) 24 18  Temp:    SpO2: 95% 94%    Last Pain:  Vitals:   02/19/21 1437  PainSc: 0-No pain                 Sherlene Rickel,E. Marquisha Nikolov

## 2021-02-20 ENCOUNTER — Telehealth: Payer: Self-pay | Admitting: Orthopedic Surgery

## 2021-02-20 ENCOUNTER — Encounter (HOSPITAL_COMMUNITY): Payer: Self-pay | Admitting: Orthopedic Surgery

## 2021-02-20 ENCOUNTER — Telehealth: Payer: Self-pay | Admitting: Emergency Medicine

## 2021-02-20 DIAGNOSIS — I10 Essential (primary) hypertension: Secondary | ICD-10-CM

## 2021-02-20 NOTE — Op Note (Signed)
NAME: [name], [name] MEDICAL RECORD NO: [id number] ACCOUNT NO: [id number] DATE OF BIRTH: 06-12-1970 FACILITY: [facility] LOCATION: [facility] PHYSICIAN: Graylin Shiver. Cleota Pellerito, MD  Operative Report   DATE OF PROCEDURE: 02/19/2021  PREOPERATIVE DIAGNOSIS:  Left knee medial meniscal tear and cyst and left foot calcaneal spur.  POSTOPERATIVE DIAGNOSIS:  Left knee medial meniscal tear and cyst and left foot calcaneal spur.  PROCEDURES:   1.  Left knee arthroscopy with partial medial meniscectomy and cyst decompression and chondroplasty of medial femoral condyle. 2.  Open excision of lateral calcaneal spur.  SURGEON ATTENDING:  Graylin Shiver. August Saucer, MD  ASSISTANT:  Karenann Cai, PA.  INDICATIONS:  The patient is a 50 year old patient with left knee pain.  MRI scan shows meniscal cyst, possible meniscal tearing on the medial side.  He also has a bony spur on the lateral aspect of his calcaneus, which interferes with shoe wear.  He  presents now for operative management after explanation of risks and benefits.  DESCRIPTION OF PROCEDURE:  The patient was brought to the operating room where general anesthetic was induced.  Preoperative antibiotics were administered.  Timeout was called.  Left knee and leg and foot prescrubbed with alcohol and Betadine allowed to  air dry, prepped with DuraPrep solution and draped in a sterile manner.  A timeout was called.  Anterior inferolateral portal was established, anterior inferomedial portal established under direct visualization.  Diagnostic arthroscopy was performed.   Patellofemoral and lateral compartments were intact.  No loose bodies in the medial or lateral gutter.  ACL and PCL intact.  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  Loose chondral flaps were debrided.  Meniscus had small tearing in the back involving 10% of the anterior, posterior width of the  meniscus, which was debrided with a basket punch and shaver.  Arthrocare wand was utilized to  decompress a cyst on the posterior horn of the meniscus.  With this performed, the Arthrocare wand was then used to cauterize any bleeders as the patient will be placed on Xarelto tonight for DVT prophylaxis.  Following this, the joint was thoroughly  irrigated.  Instruments were removed.  Portals were closed using 3-0 nylon. Solution of Marcaine, morphine, clonidine injected into the knee for postoperative pain relief.  Next, attention directed towards the foot.  The lateral heel was reprepped with  DuraPrep solution and Ioban used to cover the operative field.  Incision was made over this spur, which was just on the posterolateral aspect of the calcaneus.  Skin and subcutaneous tissue were sharply divided.  Care was taken to avoid injury to the  sural nerve, which was really not in the field.  The spur was identified and excised with osteotomes and sent to pathology.  The spur measured about 1 cm x 1 cm x 8 mm.  Rongeur was used to make the base smooth.  The superior medial border of that spur  did attach to the lateral most fibers of the Achilles tendon, but there was no destabilization of the tendon attachment from the spur removal.  The spur itself was not in the tendon, but adjacent to the tendon at the distal lateral aspect of the  calcaneus.  Following a bone spur removal a thorough irrigation was performed.  Skin was closed using 3-0 Vicryl and 3-0 nylon sutures.  Impervious dressing was placed over all incisions on the knee and foot.  A bulky Ace  wrap was placed along with  iceman.  The patient tolerated the procedure well without immediate complications.  Discussed with him and his wife the importance of doing ankle pumps and knee range of motion exercises starting tonight.  We will start him on Xarelto tonight and he will  be weightbearing as tolerated.  Plan to check ultrasound at first postop visit  and then change him back over to aspirin for DVT prophylaxis.  Luke's assistance was required at all times for retraction, opening, closing, mobilization of tissue.  His  assistance was a medical necessity.   PUS D: 02/19/2021 3:20:18 pm T: 02/20/2021 2:05:00 am  JOB: [id number]/ [id number]

## 2021-02-20 NOTE — Telephone Encounter (Signed)
1.Medication Requested: isinopril (ZESTRIL) 20 MG tablet  2. Pharmacy (Name, Street, City):[facility] [id number] - [place], [place] - [address]  3. On Med List: yes  4. Last Visit with PCP: 12-24-2020  5. Next visit date with PCP: 03-04-2021   Agent: Please be advised that RX refills may take up to 3 business days. We ask that you follow-up with your pharmacy.   Patient is requesting a 90 day supply of: lisinopril (ZESTRIL) 20 MG tablet  JARDIANCE 25 MG TABS tablet  amLODipine (NORVASC) 10 MG tablet  Patient states he is out of Lisinopril

## 2021-02-20 NOTE — Telephone Encounter (Signed)
He can use the ice machine for one hour at a time, but take breaks in between sessions.  He can shower with the bandages on but he should take the ace-wrap off today. Leave waterproof dressings on.  Does not need boot

## 2021-02-20 NOTE — Telephone Encounter (Signed)
Pt was sent home with a cooling machine. He wants to know how long he is suppose to keep it on? Can he shower with his bandages on and does he take the wrapping off? And he was not sent home in a boot, is he suppose to have one?   CB [phone]

## 2021-02-23 LAB — SURGICAL PATHOLOGY

## 2021-02-23 NOTE — Telephone Encounter (Signed)
1.Medication Requested: lisinopril (ZESTRIL) 20 MG tablet  2. Pharmacy (Name, Street, [facility]):  [facility] [id number] - [place], [place] - [place] [address] Phone:  [phone]  Fax:  [phone]      3. On Med List: yes  4. Last Visit with PCP: 8.24.2022  5. Next visit date with PCP: 11.02.2022   Agent: Please be advised that RX refills may take up to 3 business days. We ask that you follow-up with your pharmacy.

## 2021-02-23 NOTE — Telephone Encounter (Signed)
Contacted patient and he has been made aware.

## 2021-02-24 ENCOUNTER — Ambulatory Visit: Payer: 59 | Admitting: Emergency Medicine

## 2021-02-25 MED ORDER — LISINOPRIL 20 MG PO TABS: 20.0000 mg | ORAL_TABLET | Freq: Every day | ORAL | 1 refills | Status: DC

## 2021-02-25 NOTE — Telephone Encounter (Signed)
Refilled medication

## 2021-02-26 ENCOUNTER — Ambulatory Visit (INDEPENDENT_AMBULATORY_CARE_PROVIDER_SITE_OTHER): Payer: 59 | Admitting: Orthopedic Surgery

## 2021-02-26 ENCOUNTER — Other Ambulatory Visit: Payer: Self-pay

## 2021-02-26 ENCOUNTER — Encounter: Payer: Self-pay | Admitting: Orthopedic Surgery

## 2021-02-26 DIAGNOSIS — M25561 Pain in right knee: Secondary | ICD-10-CM

## 2021-02-26 DIAGNOSIS — G8929 Other chronic pain: Secondary | ICD-10-CM

## 2021-02-26 DIAGNOSIS — M25562 Pain in left knee: Secondary | ICD-10-CM

## 2021-02-26 NOTE — Progress Notes (Signed)
Post-Op Visit Note   [name]           Date of Birth: [birth year]           MRN: [id number] Visit Date: 02/26/2021 PCP: Georgina Quint, MD   Assessment & Plan:  Chief Complaint:  Chief Complaint  Patient presents with   Left Knee - Routine Post Op   Visit Diagnoses:  1. Chronic pain of left knee   2. Chronic pain of right knee     Plan: Scott [name] is a patient is now a week out left knee arthroscopy as well as left foot ossicle excision.  On exam is got no calf tenderness but that left calf is about a centimeter bigger than the right.  Varicosities present bilaterally.  Mild effusion in the left knee but he can flex to 90.  Both incisions look good on the foot and the knee.  Sutures removed Steri-Strips applied.  Okay to be weightbearing as tolerated with a cane in the croc shoes for the next 2 weeks.  Okay to go into regular shoes after that.  Okay to shower.  Needs ultrasound left lower extremity rule out DVT with history of DVT on that side.  He has been taking Xarelto for DVT prophylaxis.  Needs MRI scan of the right knee as well.  He has had 3 injections in the right knee with only mild arthritis on plain radiographs.  Having mechanical symptoms and failure of conservative treatment with symptoms ongoing much longer than the 8 weeks.  Plan to see him back after MRI scan of that right knee.  Stationary bike and nonweightbearing quad strengthening exercises encouraged for the left knee.  Follow-Up Instructions: Return in about 4 weeks (around 03/26/2021).   Orders:  Orders Placed This Encounter  Procedures   MR Knee Right w/o contrast   VAS [place] LOWER EXTREMITY VENOUS (DVT)   No orders of the defined types were placed in this encounter.   Imaging: No results found.  PMFS History: Patient Active Problem List   Diagnosis Date Noted   Preop cardiovascular exam 11/21/2020   Feces contents abnormal 11/21/2020   History of colonic polyps 11/21/2020    Hyperlipidemia 04/23/2020   Class 2 severe obesity due to excess calories with serious comorbidity and body mass index (BMI) of 37.0 to 37.9 in adult [facility]) 02/13/2019   Sleep apnea 01/23/2019   Chronic diastolic heart failure (HCC) 11/07/2016   OSA (obstructive sleep apnea) 04/18/2012   Left ventricular diastolic dysfunction, NYHA class 1/moderate LVH with concentric hypertrophy 09/03/2011   ? DVT of popliteal vein, right (non occlusive) 09/03/2011   Pulmonary embolism (HCC) 09/02/2011   Dyslipidemia associated with type 2 diabetes mellitus (HCC) 09/02/2011   Hypertension associated with diabetes (HCC) 09/02/2011   Past Medical History:  Diagnosis Date   Arthritis    Carpal tunnel syndrome    Chronic migraine 01/23/2019   Diabetes mellitus    Diastolic heart failure (HCC)    GERD (gastroesophageal reflux disease)    Hypertension    Obesity    Pulmonary embolism (HCC) 2013   He has a history of HTN.  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 A repeat scan done in 2017 did not demonstrate evidence of emboli. Neither study demonstrated any coronary calcifications.    Shoulder pain, bilateral    Sleep apnea    was fitted for mask and never got machine - too costly  Family History  Problem Relation Age of Onset   Hypertension Mother    Kidney disease Father    Hypertension Other    Hyperlipidemia Other    Diabetes Other     Past Surgical History:  Procedure Laterality Date   CARPAL TUNNEL RELEASE Right 12/04/2015   Procedure: RIGHT CARPAL TUNNEL RELEASE;  Surgeon: Betha Loa, MD;  Location: [facility];  Service: Orthopedics;  Laterality: Right;  RIGHT CARPAL TUNNEL RELEASE   CARPAL TUNNEL RELEASE Right 12/2015   [facility]   CARPAL TUNNEL RELEASE Left 04/01/2016   Procedure: LEFT CARPAL TUNNEL RELEASE;  Surgeon: Betha Loa, MD;  Location: [facility];  Service: Orthopedics;  Laterality: Left;   I & D EXTREMITY Left  02/19/2021   Procedure: LEFT FOOT SPUR REMOVAL;  Surgeon: Cammy Copa, MD;  [facility];  Service: Orthopedics;  Laterality: Left;   KNEE ARTHROSCOPY WITH MEDIAL MENISECTOMY Left 02/19/2021   Procedure: LEFT KNEE ARTHROSCOPY, MENISCAL CYST DECOMPRESSION;  Surgeon: Cammy Copa, MD;  Location: [facility] OR;  Service: Orthopedics;  Laterality: Left;   right hand surgery     ROTATOR CUFF REPAIR Right    Social History   Occupational History   Occupation: real estate agent  Tobacco Use   Smoking status: Never   Smokeless tobacco: Never  Vaping Use   Vaping Use: Never used  Substance and Sexual Activity   Alcohol use: No    Alcohol/week: 0.0 standard drinks   Drug use: No   Sexual activity: Yes

## 2021-02-28 DIAGNOSIS — M7732 Calcaneal spur, left foot: Secondary | ICD-10-CM

## 2021-02-28 DIAGNOSIS — S83242D Other tear of medial meniscus, current injury, left knee, subsequent encounter: Secondary | ICD-10-CM

## 2021-03-02 ENCOUNTER — Other Ambulatory Visit: Payer: Self-pay | Admitting: Surgical

## 2021-03-03 ENCOUNTER — Ambulatory Visit (HOSPITAL_COMMUNITY)
Admission: RE | Admit: 2021-03-03 | Discharge: 2021-03-03 | Disposition: A | Discharge status: Home / Self Care | Payer: 59 | Source: Ambulatory Visit | Attending: Orthopedic Surgery | Admitting: Orthopedic Surgery

## 2021-03-03 ENCOUNTER — Telehealth: Payer: Self-pay

## 2021-03-03 ENCOUNTER — Other Ambulatory Visit: Payer: Self-pay

## 2021-03-03 DIAGNOSIS — G8929 Other chronic pain: Secondary | ICD-10-CM | POA: Diagnosis not present

## 2021-03-03 DIAGNOSIS — M25562 Pain in left knee: Secondary | ICD-10-CM | POA: Insufficient documentation

## 2021-03-03 NOTE — Progress Notes (Signed)
Tried calling to discuss. NO answer. No VM to LM

## 2021-03-03 NOTE — Telephone Encounter (Signed)
Jody from [facility] radiology called our office to inform you that patient was negative for DVT

## 2021-03-03 NOTE — Progress Notes (Signed)
Can you have him dc xarelto and go with asa 81 mg po qd thx

## 2021-03-03 NOTE — Telephone Encounter (Signed)
Dc xarelto and transition to ASA 81mg  per dr dean

## 2021-03-04 ENCOUNTER — Ambulatory Visit (INDEPENDENT_AMBULATORY_CARE_PROVIDER_SITE_OTHER): Payer: 59 | Admitting: Emergency Medicine

## 2021-03-04 ENCOUNTER — Encounter: Payer: Self-pay | Admitting: Emergency Medicine

## 2021-03-04 ENCOUNTER — Ambulatory Visit: Payer: 59 | Attending: Internal Medicine

## 2021-03-04 VITALS — BP 132/86 | HR 64 | Temp 98.1°F | Ht 66.0 in | Wt 227.0 lb

## 2021-03-04 DIAGNOSIS — Z794 Long term (current) use of insulin: Secondary | ICD-10-CM

## 2021-03-04 DIAGNOSIS — E1169 Type 2 diabetes mellitus with other specified complication: Secondary | ICD-10-CM

## 2021-03-04 DIAGNOSIS — I10 Essential (primary) hypertension: Secondary | ICD-10-CM

## 2021-03-04 DIAGNOSIS — E1159 Type 2 diabetes mellitus with other circulatory complications: Secondary | ICD-10-CM | POA: Diagnosis not present

## 2021-03-04 DIAGNOSIS — I152 Hypertension secondary to endocrine disorders: Secondary | ICD-10-CM

## 2021-03-04 DIAGNOSIS — Z23 Encounter for immunization: Secondary | ICD-10-CM

## 2021-03-04 DIAGNOSIS — E1165 Type 2 diabetes mellitus with hyperglycemia: Secondary | ICD-10-CM

## 2021-03-04 DIAGNOSIS — E782 Mixed hyperlipidemia: Secondary | ICD-10-CM

## 2021-03-04 DIAGNOSIS — E785 Hyperlipidemia, unspecified: Secondary | ICD-10-CM

## 2021-03-04 MED ORDER — AMLODIPINE BESYLATE 10 MG PO TABS: 10.0000 mg | ORAL_TABLET | Freq: Every day | ORAL | 3 refills | Status: DC

## 2021-03-04 MED ORDER — METFORMIN HCL 500 MG PO TABS: 500.0000 mg | ORAL_TABLET | Freq: Two times a day (BID) | ORAL | 3 refills | Status: DC

## 2021-03-04 MED ORDER — LISINOPRIL 20 MG PO TABS: 20.0000 mg | ORAL_TABLET | Freq: Every day | ORAL | 3 refills | Status: DC

## 2021-03-04 MED ORDER — JARDIANCE 25 MG PO TABS: 25.0000 mg | ORAL_TABLET | Freq: Every day | ORAL | 3 refills | Status: DC

## 2021-03-04 MED ORDER — ATORVASTATIN CALCIUM 20 MG PO TABS: 20.0000 mg | ORAL_TABLET | Freq: Every day | ORAL | 3 refills | Status: DC

## 2021-03-04 MED ORDER — TRULICITY 0.75 MG/0.5ML ~~LOC~~ SOAJ: 0.7500 mg | SUBCUTANEOUS | 5 refills | Status: DC

## 2021-03-04 NOTE — Assessment & Plan Note (Addendum)
Well-controlled hypertension.  Continue lisinopril 20 mg daily and amlodipine 10 mg daily. BP Readings from Last 3 Encounters:  03/04/21 132/86  02/19/21 119/80  02/12/21 (!) 128/91  Diabetes still not under control.  Last A1c at 7.9 similar to last April's at 8.0 Lab Results  Component Value Date   HGBA1C 7.9 (H) 02/12/2021  Continue metformin 500 mg twice a day and Jardiance 25 mg daily. We will start Trulicity 0.75 mg weekly.  May need PA. Diet and nutrition discussed. Follow-up in 3 months.

## 2021-03-04 NOTE — Progress Notes (Signed)
Patient aware.

## 2021-03-04 NOTE — Patient Instructions (Signed)
Diabetes Mellitus and Nutrition, Adult When you have diabetes, or diabetes mellitus, it is very important to have healthy eating habits because your blood sugar (glucose) levels are greatly affected by what you eat and drink. Eating healthy foods in the right amounts, at about the same times every day, can help you:  Control your blood glucose.  Lower your risk of heart disease.  Improve your blood pressure.  Reach or maintain a healthy weight. What can affect my meal plan? Every person with diabetes is different, and each person has different needs for a meal plan. Your health care provider may recommend that you work with a dietitian to make a meal plan that is best for you. Your meal plan may vary depending on factors such as:  The calories you need.  The medicines you take.  Your weight.  Your blood glucose, blood pressure, and cholesterol levels.  Your activity level.  Other health conditions you have, such as heart or kidney disease. How do carbohydrates affect me? Carbohydrates, also called carbs, affect your blood glucose level more than any other type of food. Eating carbs naturally raises the amount of glucose in your blood. Carb counting is a method for keeping track of how many carbs you eat. Counting carbs is important to keep your blood glucose at a healthy level, especially if you use insulin or take certain oral diabetes medicines. It is important to know how many carbs you can safely have in each meal. This is different for every person. Your dietitian can help you calculate how many carbs you should have at each meal and for each snack. How does alcohol affect me? Alcohol can cause a sudden decrease in blood glucose (hypoglycemia), especially if you use insulin or take certain oral diabetes medicines. Hypoglycemia can be a life-threatening condition. Symptoms of hypoglycemia, such as sleepiness, dizziness, and confusion, are similar to symptoms of having too much  alcohol.  Do not drink alcohol if: ? Your health care provider tells you not to drink. ? You are pregnant, may be pregnant, or are planning to become pregnant.  If you drink alcohol: ? Do not drink on an empty stomach. ? Limit how much you use to:  0-1 drink a day for women.  0-2 drinks a day for men. ? Be aware of how much alcohol is in your drink. In the [place] one drink equals one 12 oz bottle of beer (355 mL), one 5 oz glass of wine (148 mL), or one 1 oz glass of hard liquor (44 mL). ? Keep yourself hydrated with water, diet soda, or unsweetened iced tea.  Keep in mind that regular soda, juice, and other mixers may contain a lot of sugar and must be counted as carbs. What are tips for following this plan? Reading food labels  Start by checking the serving size on the "Nutrition Facts" label of packaged foods and drinks. The amount of calories, carbs, fats, and other nutrients listed on the label is based on one serving of the item. Many items contain more than one serving per package.  Check the total grams (g) of carbs in one serving. You can calculate the number of servings of carbs in one serving by dividing the total carbs by 15. For example, if a food has 30 g of total carbs per serving, it would be equal to 2 servings of carbs.  Check the number of grams (g) of saturated fats and trans fats in one serving. Choose foods that have   a low amount or none of these fats.  Check the number of milligrams (mg) of salt (sodium) in one serving. Most people should limit total sodium intake to less than 2,300 mg per day.  Always check the nutrition information of foods labeled as "low-fat" or "nonfat." These foods may be higher in added sugar or refined carbs and should be avoided.  Talk to your dietitian to identify your daily goals for nutrients listed on the label. Shopping  Avoid buying canned, pre-made, or processed foods. These foods tend to be high in fat, sodium, and added  sugar.  Shop around the outside edge of the grocery store. This is where you will most often find fresh fruits and vegetables, bulk grains, fresh meats, and fresh dairy. Cooking  Use low-heat cooking methods, such as baking, instead of high-heat cooking methods like deep frying.  Cook using healthy oils, such as olive, canola, or sunflower oil.  Avoid cooking with butter, cream, or high-fat meats. Meal planning  Eat meals and snacks regularly, preferably at the same times every day. Avoid going long periods of time without eating.  Eat foods that are high in fiber, such as fresh fruits, vegetables, beans, and whole grains. Talk with your dietitian about how many servings of carbs you can eat at each meal.  Eat 4-6 oz (112-168 g) of lean protein each day, such as lean meat, chicken, fish, eggs, or tofu. One ounce (oz) of lean protein is equal to: ? 1 oz (28 g) of meat, chicken, or fish. ? 1 egg. ?  cup (62 g) of tofu.  Eat some foods each day that contain healthy fats, such as avocado, nuts, seeds, and fish.   What foods should I eat? Fruits Berries. Apples. Oranges. Peaches. Apricots. Plums. Grapes. Mango. Papaya. Pomegranate. Kiwi. Cherries. Vegetables Lettuce. Spinach. Leafy greens, including kale, chard, collard greens, and mustard greens. Beets. Cauliflower. Cabbage. Broccoli. Carrots. Green beans. Tomatoes. Peppers. Onions. Cucumbers. Brussels sprouts. Grains Whole grains, such as whole-wheat or whole-grain bread, crackers, tortillas, cereal, and pasta. Unsweetened oatmeal. Quinoa. Brown or wild rice. Meats and other proteins Seafood. Poultry without skin. Lean cuts of poultry and beef. Tofu. Nuts. Seeds. Dairy Low-fat or fat-free dairy products such as milk, yogurt, and cheese. The items listed above may not be a complete list of foods and beverages you can eat. Contact a dietitian for more information. What foods should I avoid? Fruits Fruits canned with  syrup. Vegetables Canned vegetables. Frozen vegetables with butter or cream sauce. Grains Refined white flour and flour products such as bread, pasta, snack foods, and cereals. Avoid all processed foods. Meats and other proteins Fatty cuts of meat. Poultry with skin. Breaded or fried meats. Processed meat. Avoid saturated fats. Dairy Full-fat yogurt, cheese, or milk. Beverages Sweetened drinks, such as soda or iced tea. The items listed above may not be a complete list of foods and beverages you should avoid. Contact a dietitian for more information. Questions to ask a health care provider  Do I need to meet with a diabetes educator?  Do I need to meet with a dietitian?  What number can I call if I have questions?  When are the best times to check my blood glucose? Where to find more information:  American Diabetes Association: diabetes.org  Academy of Nutrition and Dietetics: www.eatright.org  National Institute of Diabetes and Digestive and Kidney Diseases: www.niddk.nih.gov  Association of Diabetes Care and Education Specialists: www.diabeteseducator.org Summary  It is important to have healthy eating   habits because your blood sugar (glucose) levels are greatly affected by what you eat and drink.  A healthy meal plan will help you control your blood glucose and maintain a healthy lifestyle.  Your health care provider may recommend that you work with a dietitian to make a meal plan that is best for you.  Keep in mind that carbohydrates (carbs) and alcohol have immediate effects on your blood glucose levels. It is important to count carbs and to use alcohol carefully. This information is not intended to replace advice given to you by your health care provider. Make sure you discuss any questions you have with your health care provider. Document Revised: 03/27/2019 Document Reviewed: 03/27/2019 [employer] Patient Education  2021 [employer]  

## 2021-03-04 NOTE — Progress Notes (Signed)
Scott [name] 50 y.o.   Chief Complaint  Patient presents with   Hypertension    BP and diabetic F/U    HISTORY OF PRESENT ILLNESS: This is a 50 y.o. male with history of hypertension and diabetes here for follow-up. Recently had surgery to left knee.  Was put on prophylactic Xarelto for 2 weeks.  Still has 4 days left. Had ultrasound of left leg done yesterday and it was negative for DVT. Needs medication refills. No other complaints or medical concerns today. Lab Results  Component Value Date   HGBA1C 7.9 (H) 02/12/2021   BP Readings from Last 3 Encounters:  02/19/21 119/80  02/12/21 (!) 128/91  12/24/20 (!) 146/86     HPI   Prior to Admission medications   Medication Sig Start Date End Date Taking? Authorizing Provider  amLODipine (NORVASC) 10 MG tablet Take 1 tablet (10 mg total) by mouth daily. 11/05/20   Mayers, Cari S, PA-C  atorvastatin (LIPITOR) 20 MG tablet TAKE ONE TABLET BY MOUTH DAILY 02/16/21   Dorna Mai, MD  COVID-19 mRNA vaccine, Pfizer, 30 MCG/0.3ML injection Inject into the muscle. Patient not taking: No sig reported 08/05/20   Carlyle Basques, MD  fluticasone [facility]) 50 MCG/ACT nasal spray Place 2 sprays into both nostrils daily. Patient taking differently: Place 2 sprays into both nostrils daily as needed for allergies. 10/08/20   Hughie Closs, PA-C  glucose blood test strip Use as instructed. Inject into the skin once daily. E11.65 Patient taking differently: 1 each by Other route as needed for other (Glucose reading). Use as instructed. Inject into the skin once daily. E11.65 11/05/20   Mayers, Cari S, PA-C  guaiFENesin (MUCINEX) 600 MG 12 hr tablet Take 1,200 mg by mouth 2 (two) times daily as needed for cough or to loosen phlegm. 10/09/20 10/09/21  [provider]  JARDIANCE 25 MG TABS tablet Take 1 tablet (25 mg total) by mouth daily. 11/05/20   Mayers, Cari S, PA-C  lisinopril (ZESTRIL) 20 MG tablet Take 1 tablet (20 mg total) by mouth  daily. 02/25/21   Horald Pollen, MD  loratadine (CLARITIN REDITABS) 10 MG dissolvable tablet Take 1 tablet (10 mg total) by mouth daily. Patient not taking: No sig reported 12/13/18   Argentina Donovan, PA-C  loratadine (CLARITIN) 10 MG tablet Take 10 mg by mouth daily as needed for allergies.    [provider]  metFORMIN (GLUCOPHAGE) 500 MG tablet TAKE ONE TABLET BY MOUTH TWICE A DAY WITH A MEAL; STOP TAKING JENTADUETO 02/16/21   Dorna Mai, MD  methocarbamol (ROBAXIN) 500 MG tablet Take 1 tablet (500 mg total) by mouth every 8 (eight) hours as needed. 02/19/21   Magnant, Gerrianne Scale, PA-C  Multiple Vitamin (MULTIVITAMIN WITH MINERALS) TABS tablet Take 1 tablet by mouth daily.    [provider]  oxyCODONE-acetaminophen (PERCOCET) 5-325 MG tablet Take 1 tablet by mouth every 4 (four) hours as needed for severe pain. 02/19/21 02/19/22  Magnant, Gerrianne Scale, PA-C  rivaroxaban (XARELTO) 10 MG TABS tablet Take 1 tablet (10 mg total) by mouth daily. 02/19/21   Magnant, Charles L, PA-C  trolamine salicylate (BLUE-EMU HEMP) 10 % cream Apply 1 application topically as needed for muscle pain.    [provider]  zinc gluconate 50 MG tablet Take 50 mg by mouth daily.    [provider]    Allergies  Allergen Reactions   Pollen Extract Other (See Comments)    Patient Active Problem List  Diagnosis Date Noted   Acute medial meniscal tear, left, subsequent encounter    Heel spur, left    Preop cardiovascular exam 11/21/2020   Feces contents abnormal 11/21/2020   History of colonic polyps 11/21/2020   Hyperlipidemia 04/23/2020   Class 2 severe obesity due to excess calories with serious comorbidity and body mass index (BMI) of 37.0 to 37.9 in adult ([place]) 02/13/2019   Sleep apnea 01/23/2019   Chronic diastolic heart failure ([place]) 11/07/2016   OSA (obstructive sleep apnea) 04/18/2012   Left ventricular diastolic dysfunction, NYHA class 1/moderate LVH with  concentric hypertrophy 09/03/2011   ? DVT of popliteal vein, right (non occlusive) 09/03/2011   Pulmonary embolism ([place]) 09/02/2011   Dyslipidemia associated with type 2 diabetes mellitus ([place]) 09/02/2011   Hypertension associated with diabetes ([place]) 09/02/2011    Past Medical History:  Diagnosis Date   Arthritis    Carpal tunnel syndrome    Chronic migraine 01/23/2019   Diabetes mellitus    Diastolic heart failure (HCC)    GERD (gastroesophageal reflux disease)    Hypertension    Obesity    Pulmonary embolism ([place]) 2013   He has a history of HTN.  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 A repeat scan done in 2017 did not demonstrate evidence of emboli. Neither study demonstrated any coronary calcifications.    Shoulder pain, bilateral    Sleep apnea    was fitted for mask and never got machine - too costly    Past Surgical History:  Procedure Laterality Date   CARPAL TUNNEL RELEASE Right 12/04/2015   Procedure: RIGHT CARPAL TUNNEL RELEASE;  Surgeon: Leanora Cover, MD;  Location: [place];  Service: Orthopedics;  Laterality: Right;  RIGHT CARPAL TUNNEL RELEASE   CARPAL TUNNEL RELEASE Right 12/2015   [place]   CARPAL TUNNEL RELEASE Left 04/01/2016   Procedure: LEFT CARPAL TUNNEL RELEASE;  Surgeon: Leanora Cover, MD;  Location: [place];  Service: Orthopedics;  Laterality: Left;   I & D EXTREMITY Left 02/19/2021   Procedure: LEFT FOOT SPUR REMOVAL;  Surgeon: Meredith Pel, MD;  Location: [place];  Service: Orthopedics;  Laterality: Left;   KNEE ARTHROSCOPY WITH MEDIAL MENISECTOMY Left 02/19/2021   Procedure: LEFT KNEE ARTHROSCOPY, MENISCAL CYST DECOMPRESSION;  Surgeon: Meredith Pel, MD;  Location: [place];  Service: Orthopedics;  Laterality: Left;   right hand surgery     ROTATOR CUFF REPAIR Right     Social History   Socioeconomic History   Marital status: Married    Spouse name: Not on file   Number of  children: Not on file   Years of education: 12+   Highest education level: Not on file  Occupational History   Occupation: real estate agent  Tobacco Use   Smoking status: Never   Smokeless tobacco: Never  Vaping Use   Vaping Use: Never used  Substance and Sexual Activity   Alcohol use: No    Alcohol/week: 0.0 standard drinks   Drug use: No   Sexual activity: Yes  Other Topics Concern   Not on file  Social History Narrative   Lives at home.   Right-handed.   Occasional caffeine use.   Tea every other day   Social Determinants of Health   Financial Resource Strain: Not on file  Food Insecurity: Not on file  Transportation Needs: Not on file  Physical Activity: Not on file  Stress: Not on file  Social  Connections: Not on file  Intimate Partner Violence: Not on file    Family History  Problem Relation Age of Onset   Hypertension Mother    Kidney disease Father    Hypertension Other    Hyperlipidemia Other    Diabetes Other      Review of Systems  Constitutional: Negative.  Negative for chills and fever.  HENT: Negative.  Negative for congestion and sore throat.   Respiratory: Negative.  Negative for cough and shortness of breath.   Cardiovascular: Negative.  Negative for chest pain and palpitations.  Gastrointestinal: Negative.  Negative for abdominal pain, diarrhea, nausea and vomiting.  Genitourinary: Negative.  Negative for dysuria and hematuria.  Skin: Negative.  Negative for rash.  Neurological: Negative.  Negative for dizziness and headaches.  All other systems reviewed and are negative.   Physical Exam Vitals reviewed.  Constitutional:      Appearance: Normal appearance.  HENT:     Head: Normocephalic.  Eyes:     Extraocular Movements: Extraocular movements intact.     Conjunctiva/sclera: Conjunctivae normal.     Pupils: Pupils are equal, round, and reactive to light.  Cardiovascular:     Rate and Rhythm: Normal rate and regular rhythm.      Pulses: Normal pulses.     Heart sounds: Normal heart sounds.  Pulmonary:     Effort: Pulmonary effort is normal.     Breath sounds: Normal breath sounds.  Musculoskeletal:     Cervical back: Normal range of motion and neck supple.  Skin:    General: Skin is warm and dry.     Capillary Refill: Capillary refill takes less than 2 seconds.  Neurological:     General: No focal deficit present.     Mental Status: He is alert and oriented to person, place, and time.  Psychiatric:        Mood and Affect: Mood normal.        Behavior: Behavior normal.     ASSESSMENT & PLAN: Problem List Items Addressed This Visit       Cardiovascular and Mediastinum   Hypertension associated with diabetes (HCC) - Primary    Well-controlled hypertension.  Continue lisinopril 20 mg daily and amlodipine 10 mg daily. BP Readings from Last 3 Encounters:  03/04/21 132/86  02/19/21 119/80  02/12/21 (!) 128/91  Diabetes still not under control.  Last A1c at 7.9 similar to last April's at 8.0 Lab Results  Component Value Date   HGBA1C 7.9 (H) 02/12/2021  Continue metformin 500 mg twice a day and Jardiance 25 mg daily. We will start Trulicity 0.75 mg weekly.  May need PA. Diet and nutrition discussed. Follow-up in 3 months.        Relevant Medications   metFORMIN (GLUCOPHAGE) 500 MG tablet   lisinopril (ZESTRIL) 20 MG tablet   JARDIANCE 25 MG TABS tablet   atorvastatin (LIPITOR) 20 MG tablet   amLODipine (NORVASC) 10 MG tablet   Dulaglutide (TRULICITY) 0.75 MG/0.5ML SOPN     Endocrine   Dyslipidemia associated with type 2 diabetes mellitus (HCC)    Diet and nutrition discussed.  Continue atorvastatin 20 mg daily. The 10-year ASCVD risk score (Arnett DK, et al., 2019) is: 17.2%   Values used to calculate the score:     Age: 52 years     Sex: Male     Is Non-Hispanic African American: Yes     Diabetic: Yes     Tobacco smoker: No  Systolic Blood Pressure: [id number] mmHg     Is BP treated: Yes      HDL Cholesterol: 39.2 mg/dL     Total Cholesterol: 141 mg/dL       Relevant Medications   metFORMIN (GLUCOPHAGE) 500 MG tablet   lisinopril (ZESTRIL) 20 MG tablet   JARDIANCE 25 MG TABS tablet   atorvastatin (LIPITOR) 20 MG tablet   Dulaglutide (TRULICITY) [id number] 0000000 SOPN     Other   Hyperlipidemia   Relevant Medications   lisinopril (ZESTRIL) 20 MG tablet   atorvastatin (LIPITOR) 20 MG tablet   amLODipine (NORVASC) 10 MG tablet   Other Visit Diagnoses     Type 2 diabetes mellitus with hyperglycemia, with long-term current use of insulin (HCC)       Relevant Medications   metFORMIN (GLUCOPHAGE) 500 MG tablet   lisinopril (ZESTRIL) 20 MG tablet   JARDIANCE 25 MG TABS tablet   atorvastatin (LIPITOR) 20 MG tablet   Dulaglutide (TRULICITY) [id number] 0000000 SOPN   Essential hypertension       Relevant Medications   lisinopril (ZESTRIL) 20 MG tablet   atorvastatin (LIPITOR) 20 MG tablet   amLODipine (NORVASC) 10 MG tablet      Patient Instructions  Diabetes Mellitus and Nutrition, Adult When you have diabetes, or diabetes mellitus, it is very important to have healthy eating habits because your blood sugar (glucose) levels are greatly affected by what you eat and drink. Eating healthy foods in the right amounts, at about the same times every day, can help you: Control your blood glucose. Lower your risk of heart disease. Improve your blood pressure. Reach or maintain a healthy weight. What can affect my meal plan? Every person with diabetes is different, and each person has different needs for a meal plan. Your health care provider may recommend that you work with a dietitian to make a meal plan that is best for you. Your meal plan may vary depending on factors such as: The calories you need. The medicines you take. Your weight. Your blood glucose, blood pressure, and cholesterol levels. Your activity level. Other health conditions you have, such as heart or kidney  disease. How do carbohydrates affect me? Carbohydrates, also called carbs, affect your blood glucose level more than any other type of food. Eating carbs naturally raises the amount of glucose in your blood. Carb counting is a method for keeping track of how many carbs you eat. Counting carbs is important to keep your blood glucose at a healthy level, especially if you use insulin or take certain oral diabetes medicines. It is important to know how many carbs you can safely have in each meal. This is different for every person. Your dietitian can help you calculate how many carbs you should have at each meal and for each snack. How does alcohol affect me? Alcohol can cause a sudden decrease in blood glucose (hypoglycemia), especially if you use insulin or take certain oral diabetes medicines. Hypoglycemia can be a life-threatening condition. Symptoms of hypoglycemia, such as sleepiness, dizziness, and confusion, are similar to symptoms of having too much alcohol. Do not drink alcohol if: Your health care provider tells you not to drink. You are pregnant, may be pregnant, or are planning to become pregnant. If you drink alcohol: Do not drink on an empty stomach. Limit how much you use to: 0-1 drink a day for women. 0-2 drinks a day for men. Be aware of how much alcohol is in your drink. In the [place]  one drink equals one 12 oz bottle of beer (355 mL), one 5 oz glass of wine (148 mL), or one 1 oz glass of hard liquor (44 mL). Keep yourself hydrated with water, diet soda, or unsweetened iced tea. Keep in mind that regular soda, juice, and other mixers may contain a lot of sugar and must be counted as carbs. What are tips for following this plan? Reading food labels Start by checking the serving size on the "Nutrition Facts" label of packaged foods and drinks. The amount of calories, carbs, fats, and other nutrients listed on the label is based on one serving of the item. Many items contain more than  one serving per package. Check the total grams (g) of carbs in one serving. You can calculate the number of servings of carbs in one serving by dividing the total carbs by 15. For example, if a food has 30 g of total carbs per serving, it would be equal to 2 servings of carbs. Check the number of grams (g) of saturated fats and trans fats in one serving. Choose foods that have a low amount or none of these fats. Check the number of milligrams (mg) of salt (sodium) in one serving. Most people should limit total sodium intake to less than 2,300 mg per day. Always check the nutrition information of foods labeled as "low-fat" or "nonfat." These foods may be higher in added sugar or refined carbs and should be avoided. Talk to your dietitian to identify your daily goals for nutrients listed on the label. Shopping Avoid buying canned, pre-made, or processed foods. These foods tend to be high in fat, sodium, and added sugar. Shop around the outside edge of the grocery store. This is where you will most often find fresh fruits and vegetables, bulk grains, fresh meats, and fresh dairy. Cooking Use low-heat cooking methods, such as baking, instead of high-heat cooking methods like deep frying. Cook using healthy oils, such as olive, canola, or sunflower oil. Avoid cooking with butter, cream, or high-fat meats. Meal planning Eat meals and snacks regularly, preferably at the same times every day. Avoid going long periods of time without eating. Eat foods that are high in fiber, such as fresh fruits, vegetables, beans, and whole grains. Talk with your dietitian about how many servings of carbs you can eat at each meal. Eat 4-6 oz (112-168 g) of lean protein each day, such as lean meat, chicken, fish, eggs, or tofu. One ounce (oz) of lean protein is equal to: 1 oz (28 g) of meat, chicken, or fish. 1 egg.  cup (62 g) of tofu. Eat some foods each day that contain healthy fats, such as avocado, nuts, seeds, and  fish. What foods should I eat? Fruits Berries. Apples. Oranges. Peaches. Apricots. Plums. Grapes. Mango. Papaya. Pomegranate. Kiwi. Cherries. Vegetables Lettuce. Spinach. Leafy greens, including kale, chard, collard greens, and mustard greens. Beets. Cauliflower. Cabbage. Broccoli. Carrots. Green beans. Tomatoes. Peppers. Onions. Cucumbers. Brussels sprouts. Grains Whole grains, such as whole-wheat or whole-grain bread, crackers, tortillas, cereal, and pasta. Unsweetened oatmeal. Quinoa. Brown or wild rice. Meats and other proteins Seafood. Poultry without skin. Lean cuts of poultry and beef. Tofu. Nuts. Seeds. Dairy Low-fat or fat-free dairy products such as milk, yogurt, and cheese. The items listed above may not be a complete list of foods and beverages you can eat. Contact a dietitian for more information. What foods should I avoid? Fruits Fruits canned with syrup. Vegetables Canned vegetables. Frozen vegetables with butter or cream  sauce. Grains Refined white flour and flour products such as bread, pasta, snack foods, and cereals. Avoid all processed foods. Meats and other proteins Fatty cuts of meat. Poultry with skin. Breaded or fried meats. Processed meat. Avoid saturated fats. Dairy Full-fat yogurt, cheese, or milk. Beverages Sweetened drinks, such as soda or iced tea. The items listed above may not be a complete list of foods and beverages you should avoid. Contact a dietitian for more information. Questions to ask a health care provider Do I need to meet with a diabetes educator? Do I need to meet with a dietitian? What number can I call if I have questions? When are the best times to check my blood glucose? Where to find more information: American Diabetes Association: diabetes.org Academy of Nutrition and Dietetics: www.eatright.[employer] of Diabetes and Digestive and Kidney Diseases: [employer] Association of Diabetes Care and Education  Specialists: www.diabeteseducator.org Summary It is important to have healthy eating habits because your blood sugar (glucose) levels are greatly affected by what you eat and drink. A healthy meal plan will help you control your blood glucose and maintain a healthy lifestyle. Your health care provider may recommend that you work with a dietitian to make a meal plan that is best for you. Keep in mind that carbohydrates (carbs) and alcohol have immediate effects on your blood glucose levels. It is important to count carbs and to use alcohol carefully. This information is not intended to replace advice given to you by your health care provider. Make sure you discuss any questions you have with your health care provider. Document Revised: 03/27/2019 Document Reviewed: 03/27/2019 Elsevier Patient Education  2021 [place], MD [facility]

## 2021-03-04 NOTE — Assessment & Plan Note (Signed)
Diet and nutrition discussed.  Continue atorvastatin 20 mg daily. The 10-year ASCVD risk score (Arnett DK, et al., 2019) is: 17.2%   Values used to calculate the score:     Age: 50 years     Sex: Male     Is Non-Hispanic African American: Yes     Diabetic: Yes     Tobacco smoker: No     Systolic Blood Pressure: 132 mmHg     Is BP treated: Yes     HDL Cholesterol: 39.2 mg/dL     Total Cholesterol: 141 mg/dL

## 2021-03-17 ENCOUNTER — Ambulatory Visit
Admission: RE | Admit: 2021-03-17 | Discharge: 2021-03-17 | Disposition: A | Discharge status: Home / Self Care | Payer: 59 | Source: Ambulatory Visit | Attending: Orthopedic Surgery | Admitting: Orthopedic Surgery

## 2021-03-17 ENCOUNTER — Other Ambulatory Visit: Payer: Self-pay

## 2021-03-17 DIAGNOSIS — G8929 Other chronic pain: Secondary | ICD-10-CM

## 2021-03-17 DIAGNOSIS — M25561 Pain in right knee: Secondary | ICD-10-CM

## 2021-03-25 ENCOUNTER — Encounter: Payer: Self-pay | Admitting: Orthopedic Surgery

## 2021-03-25 ENCOUNTER — Ambulatory Visit (INDEPENDENT_AMBULATORY_CARE_PROVIDER_SITE_OTHER): Payer: 59 | Admitting: Orthopedic Surgery

## 2021-03-25 ENCOUNTER — Other Ambulatory Visit: Payer: Self-pay

## 2021-03-25 DIAGNOSIS — Z9889 Other specified postprocedural states: Secondary | ICD-10-CM

## 2021-03-25 NOTE — Progress Notes (Signed)
Post-Op Visit Note   [name]           Date of Birth: 11-25-1970           MRN: [id number] Visit Date: 03/25/2021 PCP: Georgina Quint, MD   Assessment & Plan:  Chief Complaint:  Chief Complaint  Patient presents with   Left Knee - Routine Post Op     02/19/21 (4w 6d) Left Knee Arthroscopy, Meniscal Cyst Decompression   Left Foot Spur Removal      Visit Diagnoses:  1. Post-operative state     Plan: Scott [name] is a 50 year old patient underwent left knee arthroscopy with meniscal cyst decompression 02/19/2021.  Also had left foot spur removal.  He has been ambulating with a cane.  He had a fair amount of arthritis anteromedially.  This is very is having symptoms.  He has good and bad days.  Doing stationary bike.  He also has some issues with his low back.  That MRI scan is reviewed and he does have some facet arthritis.  He has had several facet injections with [facility] imaging.  He would like to have that moved to here.  I will refer him to Dr. Alvester Morin for that evaluation.  On exam he has moderate effusion in the left knee.  That is aspirated today and we got about 60 cc out.  Injected with Toradol.  Continue with nonweightbearing quad strengthening exercises.  Follow-up in 5 weeks for clinical recheck.  He has slightly less than 1 cm circumferential swelling on that left calf compared to the right but he did have an ultrasound about 10 days after surgery which was negative for DVT.  He has been ambulating since that time.  We will follow that up in 5 weeks as well.  Negative Homans and no calf tenderness today.  Follow-Up Instructions: Return in about 5 weeks (around 04/29/2021).   Orders:  Orders Placed This Encounter  Procedures   Ambulatory referral to Physical Medicine Rehab   No orders of the defined types were placed in this encounter.   Imaging: No results found.  PMFS History: Patient Active Problem List   Diagnosis Date Noted   Acute medial  meniscal tear, left, subsequent encounter    Heel spur, left    History of colonic polyps 11/21/2020   Hyperlipidemia 04/23/2020   Class 2 severe obesity due to excess calories with serious comorbidity and body mass index (BMI) of 37.0 to 37.9 in adult (HCC) 02/13/2019   Sleep apnea 01/23/2019   Chronic diastolic heart failure (HCC) 11/07/2016   OSA (obstructive sleep apnea) 04/18/2012   Left ventricular diastolic dysfunction, NYHA class 1/moderate LVH with concentric hypertrophy 09/03/2011   ? DVT of popliteal vein, right (non occlusive) 09/03/2011   Pulmonary embolism (HCC) 09/02/2011   Dyslipidemia associated with type 2 diabetes mellitus (HCC) 09/02/2011   Hypertension associated with diabetes (HCC) 09/02/2011   Past Medical History:  Diagnosis Date   Arthritis    Carpal tunnel syndrome    Chronic migraine 01/23/2019   Diabetes mellitus    Diastolic heart failure (HCC)    GERD (gastroesophageal reflux disease)    Hypertension    Obesity    Pulmonary embolism (HCC) 2013   He has a history of HTN.  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 A repeat scan done in 2017 did not demonstrate evidence of emboli. Neither study demonstrated any coronary calcifications.    Shoulder pain, bilateral  Sleep apnea    was fitted for mask and never got machine - too costly    Family History  Problem Relation Age of Onset   Hypertension Mother    Kidney disease Father    Hypertension Other    Hyperlipidemia Other    Diabetes Other     Past Surgical History:  Procedure Laterality Date   CARPAL TUNNEL RELEASE Right 12/04/2015   Procedure: RIGHT CARPAL TUNNEL RELEASE;  Surgeon: Betha Loa, MD;  Location: [facility];  Service: Orthopedics;  Laterality: Right;  RIGHT CARPAL TUNNEL RELEASE   CARPAL TUNNEL RELEASE Right 12/2015   [facility]   CARPAL TUNNEL RELEASE Left 04/01/2016   Procedure: LEFT CARPAL TUNNEL RELEASE;  Surgeon: Betha Loa, MD;   Location: [facility];  Service: Orthopedics;  Laterality: Left;   I & D EXTREMITY Left 02/19/2021   Procedure: LEFT FOOT SPUR REMOVAL;  Surgeon: Cammy Copa, MD;  Location: [facility];  Service: Orthopedics;  Laterality: Left;   KNEE ARTHROSCOPY WITH MEDIAL MENISECTOMY Left 02/19/2021   Procedure: LEFT KNEE ARTHROSCOPY, MENISCAL CYST DECOMPRESSION;  Surgeon: Cammy Copa, MD;  Location: [facility] OR;  Service: Orthopedics;  Laterality: Left;   right hand surgery     ROTATOR CUFF REPAIR Right    Social History   Occupational History   Occupation: real estate agent  Tobacco Use   Smoking status: Never   Smokeless tobacco: Never  Vaping Use   Vaping Use: Never used  Substance and Sexual Activity   Alcohol use: No    Alcohol/week: 0.0 standard drinks   Drug use: No   Sexual activity: Yes

## 2021-03-31 DIAGNOSIS — K219 Gastro-esophageal reflux disease without esophagitis: Secondary | ICD-10-CM | POA: Insufficient documentation

## 2021-04-03 ENCOUNTER — Ambulatory Visit: Payer: 59 | Admitting: Cardiology

## 2021-04-20 ENCOUNTER — Telehealth: Payer: Self-pay | Admitting: Orthopedic Surgery

## 2021-04-20 NOTE — Telephone Encounter (Signed)
Pt called requesting a call back. Pt states he still feels like he has his bone spur in foot and also has right knee pains and need medical advice. Pt did not make an appt. Please call pt concerning this matter. Pt phone number is [phone].

## 2021-04-21 ENCOUNTER — Telehealth: Payer: Self-pay | Admitting: Orthopedic Surgery

## 2021-04-21 NOTE — Telephone Encounter (Signed)
Pt returned call to Lauren F. Pt states if he is unable to answer please leave him a vm. Pt phone number is [phone].

## 2021-04-21 NOTE — Telephone Encounter (Signed)
Tried calling patient. No answer.

## 2021-04-22 ENCOUNTER — Telehealth: Payer: Self-pay | Admitting: Orthopedic Surgery

## 2021-04-22 NOTE — Telephone Encounter (Signed)
Pt asked for me to send reminder to please call him sometime today. Phone number is [phone].

## 2021-04-23 NOTE — Telephone Encounter (Signed)
IC advised.  

## 2021-04-23 NOTE — Telephone Encounter (Signed)
Duplicate-called patient. See other note

## 2021-04-23 NOTE — Telephone Encounter (Signed)
Stationary bike and scar massage

## 2021-05-06 ENCOUNTER — Ambulatory Visit (INDEPENDENT_AMBULATORY_CARE_PROVIDER_SITE_OTHER): Payer: 59 | Admitting: Surgical

## 2021-05-06 ENCOUNTER — Other Ambulatory Visit: Payer: Self-pay

## 2021-05-06 DIAGNOSIS — M1712 Unilateral primary osteoarthritis, left knee: Secondary | ICD-10-CM | POA: Diagnosis not present

## 2021-05-06 NOTE — Progress Notes (Signed)
Post-Op Visit Note   [name]           Date of Birth: [birth year]           MRN: [id number] Visit Date: 05/06/2021 PCP: Georgina Quint, MD   Assessment & Plan:  Chief Complaint:  Chief Complaint  Patient presents with   Left Knee - Routine Post Op    02/19/21 (10w 6d) Knee Arthroscopy, Meniscal Cyst Decompression - Left        Left Foot - Routine Post Op    02/19/21--Left Foot Spur Removal    Visit Diagnoses:  1. Unilateral primary osteoarthritis, left knee     Plan: Patient is a 51 year old male who presents for postop visit s/p left knee arthroscopy with meniscal cyst decompression on 02/19/2021 as well as left foot bone spur debridement on the same date.  He reports his knee is improving since the procedure but he still has some continued discomfort and swelling.  He is using a brace at times for his knee which is helpful.  Most of his pain is localized to the medial aspect of the knee.  He has increased pain and a slight limp when he first gets up from a seated position and starts to ambulate but after walking for a period of time he feels his gait becomes more normal.  Denies any pain that wakes him up at night.  No calf pain/chest pain/shortness of breath.  No significant limitation of his walking endurance at this time.  He also reports that his left heel feels "lumpy" of the incision site.  Ortho exam demonstrates left knee with moderate effusion.  Incisions are healing well with no evidence of infection or dehiscence.  Range of motion from 3 degrees extension to 95 degrees of knee flexion.  Able to perform straight leg raise with excellent quad strength rated 5/5.  No pain with hip range of motion.  Tenderness over the medial joint line moderately with mild lateral joint line tenderness.  Left heel incision is healing well with fibrous scar tissue noted beneath the skin by palpation.  No sign of infection or dehiscence of the heel incision.  Plan is  for patient to continue rehabbing his knee with nonloadbearing quadricep strengthening exercises and stationary bike and walking.  He is still improving from the surgery but he does have a moderate effusion on exam today so he is requesting aspiration.  Reviewed his intraoperative arthroscopy pictures with him today and he has fairly pronounced arthritis of the medial compartment that was not evident on MRI scan preoperatively.  Explained that this could account for some of his continued pain/discomfort and swelling.  Left knee was successfully aspirated of about 40 cc of nonpurulent appearing synovial fluid.  Toradol injection administered.  Patient tolerated the procedure well.  Follow-up with the office as needed if his knee pain continues to bother him.  Did discuss knee replacement as a procedure and the recovery timeframe/risks/benefits of knee replacement but it does not seem like patient is close to needing a knee replacement at this point and recommended he hold off on pursuing knee replacement due to his young age.  Follow-Up Instructions: No follow-ups on file.   Orders:  No orders of the defined types were placed in this encounter.  No orders of the defined types were placed in this encounter.   Imaging: No results found.  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) 02/13/2019   Sleep apnea 01/23/2019   Arthritis 01/23/2019   Chronic diastolic heart failure (HCC) 11/07/2016   OSA (obstructive sleep apnea)  04/18/2012   Left ventricular diastolic dysfunction, NYHA class 1/moderate LVH with concentric hypertrophy 09/03/2011   ? DVT of popliteal vein, right (non occlusive) 09/03/2011   Pulmonary embolism (HCC) 09/02/2011   Dyslipidemia associated with type 2 diabetes mellitus (HCC) 09/02/2011   Hypertension associated with diabetes (HCC) 09/02/2011   Past Medical History:  Diagnosis Date   Arthritis    Carpal tunnel syndrome    Chronic migraine 01/23/2019   Diabetes mellitus    Diastolic heart failure (HCC)    GERD (gastroesophageal reflux disease)    Hypertension    Obesity    Pulmonary embolism (HCC) 2013   He has a history of HTN.  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 A repeat scan done in 2017 did not demonstrate evidence of emboli. Neither study demonstrated any coronary calcifications.    Shoulder pain, bilateral    Sleep apnea    was fitted for mask and never got machine - too costly    Family History  Problem Relation Age of Onset   Hypertension Mother    Kidney disease Father    Hypertension Other    Hyperlipidemia Other    Diabetes Other     Past Surgical History:  Procedure Laterality Date   CARPAL TUNNEL RELEASE Right 12/04/2015   Procedure: RIGHT CARPAL TUNNEL RELEASE;  Surgeon: Betha Loa, MD;  Location: [facility];  Service: Orthopedics;  Laterality: Right;  RIGHT CARPAL TUNNEL RELEASE   CARPAL TUNNEL RELEASE Right 12/2015   [facility]   CARPAL TUNNEL RELEASE Left 04/01/2016   Procedure: LEFT CARPAL TUNNEL RELEASE;  Surgeon: Betha Loa, MD;  Location: [facility];  Service: Orthopedics;  Laterality: Left;   I & D EXTREMITY Left 02/19/2021   Procedure: LEFT FOOT SPUR REMOVAL;  Surgeon: Cammy Copa, MD;  Location: [facility] OR;  Service: Orthopedics;  Laterality: Left;   KNEE ARTHROSCOPY WITH MEDIAL MENISECTOMY Left 02/19/2021   Procedure: LEFT KNEE ARTHROSCOPY, MENISCAL CYST DECOMPRESSION;  Surgeon: Cammy Copa, MD;  Location: [facility] OR;  Service: Orthopedics;  Laterality: Left;   right hand surgery     ROTATOR CUFF REPAIR Right    Social History   Occupational History   Occupation: real estate agent  Tobacco Use   Smoking status: Never   Smokeless tobacco: Never  Vaping Use   Vaping Use: Never used  Substance and Sexual Activity   Alcohol use: No    Alcohol/week: 0.0 standard drinks   Drug use: No   Sexual activity: Yes

## 2021-05-07 ENCOUNTER — Encounter: Payer: Self-pay | Admitting: Orthopedic Surgery

## 2021-05-07 MED ORDER — BUPIVACAINE HCL 0.25 % IJ SOLN
4.0000 mL | INTRAMUSCULAR | Status: AC | PRN
  Administered 2021-05-06: 4 mL via INTRA_ARTICULAR

## 2021-05-07 MED ORDER — LIDOCAINE HCL 1 % IJ SOLN
5.0000 mL | INTRAMUSCULAR | Status: AC | PRN
  Administered 2021-05-06: 5 mL

## 2021-05-07 NOTE — Progress Notes (Signed)
° °  Procedure Note  [name]             Date of Birth: Jul 26, 1970           MRN: [id number]             Visit Date: 05/06/2021  Procedures: Visit Diagnoses:  1. Unilateral primary osteoarthritis, left knee     Large Joint Inj: L knee on 05/06/2021 5:28 PM Indications: diagnostic evaluation, joint swelling and pain Details: 18 G 1.5 in needle, superolateral approach  Arthrogram: No  Medications: 5 mL lidocaine 1 %; 4 mL bupivacaine 0.25 % (Bupivacaine was mixed with 1 cc Toradol) Outcome: tolerated well, no immediate complications Procedure, treatment alternatives, risks and benefits explained, specific risks discussed. Consent was given by the patient. Immediately prior to procedure a time out was called to verify the correct patient, procedure, equipment, support staff and site/side marked as required. Patient was prepped and draped in the usual sterile fashion.

## 2021-06-04 ENCOUNTER — Ambulatory Visit (INDEPENDENT_AMBULATORY_CARE_PROVIDER_SITE_OTHER): Payer: 59 | Admitting: Emergency Medicine

## 2021-06-04 ENCOUNTER — Encounter: Payer: Self-pay | Admitting: Emergency Medicine

## 2021-06-04 ENCOUNTER — Other Ambulatory Visit: Payer: Self-pay

## 2021-06-04 VITALS — BP 124/76 | HR 85 | Temp 98.8°F | Ht 66.0 in | Wt 237.0 lb

## 2021-06-04 DIAGNOSIS — E1159 Type 2 diabetes mellitus with other circulatory complications: Secondary | ICD-10-CM

## 2021-06-04 DIAGNOSIS — E1169 Type 2 diabetes mellitus with other specified complication: Secondary | ICD-10-CM | POA: Diagnosis not present

## 2021-06-04 DIAGNOSIS — I152 Hypertension secondary to endocrine disorders: Secondary | ICD-10-CM | POA: Diagnosis not present

## 2021-06-04 DIAGNOSIS — E785 Hyperlipidemia, unspecified: Secondary | ICD-10-CM | POA: Diagnosis not present

## 2021-06-04 LAB — POCT GLYCOSYLATED HEMOGLOBIN (HGB A1C): Hemoglobin A1C: 7.2 % — AB (ref 4.0–5.6)

## 2021-06-04 MED ORDER — TRULICITY 1.5 MG/0.5ML ~~LOC~~ SOAJ: 1.5000 mg | SUBCUTANEOUS | 7 refills | Status: DC

## 2021-06-04 NOTE — Patient Instructions (Signed)

## 2021-06-04 NOTE — Assessment & Plan Note (Signed)
Stable.  Continue atorvastatin 20 mg daily. The 10-year ASCVD risk score (Arnett DK, et al., 2019) is: 15.4%   Values used to calculate the score:     Age: 51 years     Sex: Male     Is Non-Hispanic African American: Yes     Diabetic: Yes     Tobacco smoker: No     Systolic Blood Pressure: 124 mmHg     Is BP treated: Yes     HDL Cholesterol: 39.2 mg/dL     Total Cholesterol: 141 mg/dL

## 2021-06-04 NOTE — Progress Notes (Signed)
Scott [name] 51 y.o.   Chief Complaint  Patient presents with   Diabetes    BP and diabetic f/u. Pt need a refill on onetouch glucose meter and strips    HISTORY OF PRESENT ILLNESS: This is a 51 y.o. male with history of hypertension and diabetes here for follow-up Presently on metformin, Trulicity, and Jardiance. Eating better.  History of hypertension on amlodipine. Has no complaints or medical concerns today. Lab Results  Component Value Date   HGBA1C 7.9 (H) 02/12/2021   BP Readings from Last 3 Encounters:  03/04/21 132/86  02/19/21 119/80  02/12/21 (!) 128/91   Wt Readings from Last 3 Encounters:  03/04/21 227 lb (103 kg)  02/19/21 230 lb (104.3 kg)  02/12/21 229 lb (103.9 kg)     Diabetes Pertinent negatives for hypoglycemia include no dizziness or headaches. Pertinent negatives for diabetes include no chest pain.    Prior to Admission medications   Medication Sig Start Date End Date Taking? Authorizing Provider  amLODipine (NORVASC) 10 MG tablet Take 1 tablet (10 mg total) by mouth daily. 03/04/21  Yes Anneliese Leblond, Ines Bloomer, MD  atorvastatin (LIPITOR) 20 MG tablet Take 1 tablet (20 mg total) by mouth daily. 03/04/21 06/04/21 Yes Raziya Aveni, Ines Bloomer, MD  Dulaglutide (TRULICITY) [id number] SOPN Inject 0.75 mg into the skin once a week. 03/04/21  Yes Creta Dorame, Ines Bloomer, MD  fluticasone [facility]) 50 MCG/ACT nasal spray Place 2 sprays into both nostrils daily. Patient taking differently: Place 2 sprays into both nostrils daily as needed for allergies. 10/08/20  Yes CovingtonJudson Roch M, PA-C  glucose blood test strip Use as instructed. Inject into the skin once daily. E11.65 Patient taking differently: 1 each by Other route as needed for other (Glucose reading). Use as instructed. Inject into the skin once daily. E11.65 11/05/20  Yes Mayers, Cari S, PA-C  loratadine (CLARITIN REDITABS) 10 MG dissolvable tablet Take 1 tablet (10 mg total) by mouth daily. 12/13/18  Yes  Freeman Caldron M, PA-C  loratadine (CLARITIN) 10 MG tablet Take 10 mg by mouth daily as needed for allergies.   Yes [provider]  metFORMIN (GLUCOPHAGE) 500 MG tablet Take 1 tablet (500 mg total) by mouth 2 (two) times daily with a meal. 03/04/21 06/04/21 Yes Shakedra Beam, Ines Bloomer, MD  methocarbamol (ROBAXIN) 500 MG tablet Take 1 tablet (500 mg total) by mouth every 8 (eight) hours as needed. 02/19/21  Yes Magnant, Gerrianne Scale, PA-C  Multiple Vitamin (MULTIVITAMIN WITH MINERALS) TABS tablet Take 1 tablet by mouth daily.   Yes [provider]  oxyCODONE-acetaminophen (PERCOCET) 5-325 MG tablet Take 1 tablet by mouth every 4 (four) hours as needed for severe pain. 02/19/21 02/19/22 Yes Magnant, Charles L, PA-C  trolamine salicylate (BLUE-EMU HEMP) 10 % cream Apply 1 application topically as needed for muscle pain.   Yes [provider]  zinc gluconate 50 MG tablet Take 50 mg by mouth daily.   Yes [provider]  COVID-19 mRNA vaccine, Pfizer, 30 MCG/0.3ML injection Inject into the muscle. Patient not taking: Reported on 06/04/2021 08/05/20   Carlyle Basques, MD  guaiFENesin (MUCINEX) 600 MG 12 hr tablet Take 1,200 mg by mouth 2 (two) times daily as needed for cough or to loosen phlegm. Patient not taking: Reported on 06/04/2021 10/09/20 10/09/21  [provider]  lisinopril (ZESTRIL) 20 MG tablet Take 1 tablet (20 mg total) by mouth daily. 03/04/21 06/02/21  Horald Pollen, MD  rivaroxaban (XARELTO) 10 MG TABS tablet Take  1 tablet (10 mg total) by mouth daily. 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 DVT of popliteal vein, right (non occlusive) 09/03/2011   Pulmonary embolism ([place]) 09/02/2011   Dyslipidemia associated with type 2 diabetes mellitus ([place]) 09/02/2011   Hypertension associated with diabetes ([place]) 09/02/2011    Past Medical History:  Diagnosis Date   Arthritis    Carpal tunnel syndrome    Chronic migraine 01/23/2019   Diabetes mellitus    Diastolic heart failure (HCC)    GERD (gastroesophageal reflux disease)    Hypertension    Obesity    Pulmonary embolism ([place]) 2013   He has a history of HTN.  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 A repeat scan done in 2017 did not demonstrate evidence of emboli. Neither study demonstrated any coronary calcifications.    Shoulder pain, bilateral    Sleep apnea    was fitted for mask and never got machine - too costly    Past Surgical History:  Procedure Laterality Date   CARPAL TUNNEL RELEASE Right 12/04/2015   Procedure: RIGHT CARPAL TUNNEL RELEASE;  Surgeon: Leanora Cover, MD;  Location: [place];  Service: Orthopedics;  Laterality: Right;  RIGHT CARPAL TUNNEL RELEASE   CARPAL TUNNEL RELEASE Right 12/2015   [place]   CARPAL TUNNEL RELEASE Left 04/01/2016   Procedure: LEFT CARPAL TUNNEL RELEASE;  Surgeon: Leanora Cover, MD;  Location: [place];  Service: Orthopedics;  Laterality: Left;   I & D EXTREMITY Left 02/19/2021   Procedure: LEFT FOOT  SPUR REMOVAL;  Surgeon: Meredith Pel, MD;  Location: [place];  Service: Orthopedics;  Laterality: Left;   KNEE ARTHROSCOPY WITH MEDIAL MENISECTOMY Left 02/19/2021   Procedure: LEFT KNEE ARTHROSCOPY, MENISCAL CYST DECOMPRESSION;  Surgeon: Meredith Pel, MD;  Location: [place];  Service: Orthopedics;  Laterality: Left;   right hand surgery     ROTATOR CUFF REPAIR Right     Social History   Socioeconomic History   Marital status: Married    Spouse name: Not on file   Number of children: Not on file   Years of education: 12+   Highest education level: Not on file  Occupational History   Occupation: real estate agent  Tobacco Use   Smoking status: Never   Smokeless tobacco: Never  Vaping Use   Vaping Use: Never used  Substance and Sexual Activity   Alcohol use: No    Alcohol/week: 0.0 standard drinks   Drug use: No   Sexual activity: Yes  Other Topics Concern   Not on  file  Social History Narrative   Lives at home.   Right-handed.   Occasional caffeine use.   Tea every other day   Social Determinants of Health   Financial Resource Strain: Not on file  Food Insecurity: Not on file  Transportation Needs: Not on file  Physical Activity: Not on file  Stress: Not on file  Social Connections: Not on file  Intimate Partner Violence: Not on file    Family History  Problem Relation Age of Onset   Hypertension Mother    Kidney disease Father    Hypertension Other    Hyperlipidemia Other    Diabetes Other      Review of Systems  Constitutional: Negative.  Negative for chills and fever.  HENT: Negative.  Negative for congestion and sore throat.   Respiratory: Negative.  Negative for cough and shortness of breath.   Cardiovascular: Negative.  Negative for chest pain and palpitations.  Gastrointestinal: Negative.  Negative for abdominal pain, diarrhea, nausea and vomiting.  Genitourinary: Negative.  Negative for dysuria and hematuria.  Skin: Negative.  Negative  for rash.  Neurological: Negative.  Negative for dizziness and headaches.  All other systems reviewed and are negative.  Today's Vitals   06/04/21 1439  BP: 124/76  Pulse: 85  Temp: 98.8 F (37.1 C)  TempSrc: Oral  SpO2: 97%  Weight: 237 lb (107.5 kg)  Height: 5\' 6"  (1.676 m)   Body mass index is 38.25 kg/m.  Physical Exam Vitals reviewed.  Constitutional:      Appearance: Normal appearance.  HENT:     Head: Normocephalic.  Eyes:     Extraocular Movements: Extraocular movements intact.     Pupils: Pupils are equal, round, and reactive to light.  Cardiovascular:     Rate and Rhythm: Normal rate and regular rhythm.     Pulses: Normal pulses.     Heart sounds: Normal heart sounds.  Pulmonary:     Effort: Pulmonary effort is normal.     Breath sounds: Normal breath sounds.  Musculoskeletal:        General: Normal range of motion.     Cervical back: No tenderness.  Lymphadenopathy:     Cervical: No cervical adenopathy.  Skin:    General: Skin is warm and dry.     Capillary Refill: Capillary refill takes less than 2 seconds.  Neurological:     General: No focal deficit present.     Mental Status: He is alert and oriented to person, place, and time.  Psychiatric:        Mood and Affect: Mood normal.        Behavior: Behavior normal.    Results for orders placed or performed in visit on 06/04/21 (from the past 24 hour(s))  POCT glycosylated hemoglobin (Hb A1C)     Status: Abnormal   Collection Time: 06/04/21  2:54 PM  Result Value Ref Range   Hemoglobin A1C 7.2 (A) 4.0 - 5.6 %   HbA1c POC (<> result, manual entry)     HbA1c, POC (prediabetic range)     HbA1c, POC (controlled diabetic range)      ASSESSMENT & PLAN: Problem List Items Addressed This Visit       Cardiovascular and Mediastinum   Hypertension associated with diabetes ([place]) - Primary    Well-controlled hypertension.  Continue amlodipine 10 mg daily. Diabetes much improved with hemoglobin A1c of  7.2. Continue metformin 500 mg twice a day, Jardiance 10 mg daily. Will increase Trulicity to  1.5 mg weekly. Diet and nutrition discussed. Follow-up in 3 months.      Relevant Medications   Dulaglutide (TRULICITY) 1.5 MG/0.5ML SOPN   Other Relevant Orders   POCT glycosylated hemoglobin (Hb A1C) (Completed)   Amb Ref to Medical Weight Management     Endocrine   Dyslipidemia associated with type 2 diabetes mellitus (HCC)    Stable.  Continue atorvastatin 20 mg daily. The 10-year ASCVD risk score (Arnett DK, et al., 2019) is: 15.4%   Values used to calculate the score:     Age: 38 years     Sex: Male     Is Non-Hispanic African American: Yes     Diabetic: Yes     Tobacco smoker: No     Systolic Blood Pressure: 124 mmHg     Is BP treated: Yes     HDL Cholesterol: 39.2 mg/dL     Total Cholesterol: 141 mg/dL       Relevant Medications   Dulaglutide (TRULICITY) 1.5 MG/0.5ML SOPN   Other Relevant Orders   Amb Ref to Medical Weight Management   Patient Instructions  Diabetes Mellitus and Nutrition, Adult When you have diabetes, or diabetes mellitus, it is very important to have healthy eating habits because your blood sugar (glucose) levels are greatly affected by what you eat and drink. Eating healthy foods in the right amounts, at about the same times every day, can help you: Manage your blood glucose. Lower your risk of heart disease. Improve your blood pressure. Reach or maintain a healthy weight. What can affect my meal plan? Every person with diabetes is different, and each person has different needs for a meal plan. Your health care provider may recommend that you work with a dietitian to make a meal plan that is best for you. Your meal plan may vary depending on factors such as: The calories you need. The medicines you take. Your weight. Your blood glucose, blood pressure, and cholesterol levels. Your activity level. Other health conditions you have, such as heart or  kidney disease. How do carbohydrates affect me? Carbohydrates, also called carbs, affect your blood glucose level more than any other type of food. Eating carbs raises the amount of glucose in your blood. It is important to know how many carbs you can safely have in each meal. This is different for every person. Your dietitian can help you calculate how many carbs you should have at each meal and for each snack. How does alcohol affect me? Alcohol can cause a decrease in blood glucose (hypoglycemia), especially if you use insulin or take certain diabetes medicines by mouth. Hypoglycemia can be a life-threatening condition. Symptoms of hypoglycemia, such as sleepiness, dizziness, and confusion, are similar to symptoms of having too much alcohol. Do not drink alcohol if: Your health care provider tells you not to drink. You are pregnant, may be pregnant, or are planning to become pregnant. If you drink alcohol: Limit how much you have to: 0-1 drink a day for women. 0-2 drinks a day for men. Know how much alcohol is in your drink. In the [place] one drink equals one 12 oz bottle of beer (355 mL), one 5 oz glass of wine (148 mL), or one 1 oz glass of hard liquor (44 mL). Keep yourself hydrated with water, diet soda, or unsweetened iced tea. Keep in mind that regular soda, juice, and other mixers may contain a lot of sugar and must be counted as carbs. What are tips for following this  plan? Reading food labels Start by checking the serving size on the Nutrition Facts label of packaged foods and drinks. The number of calories and the amount of carbs, fats, and other nutrients listed on the label are based on one serving of the item. Many items contain more than one serving per package. Check the total grams (g) of carbs in one serving. Check the number of grams of saturated fats and trans fats in one serving. Choose foods that have a low amount or none of these fats. Check the number of milligrams (mg)  of salt (sodium) in one serving. Most people should limit total sodium intake to less than 2,300 mg per day. Always check the nutrition information of foods labeled as "low-fat" or "nonfat." These foods may be higher in added sugar or refined carbs and should be avoided. Talk to your dietitian to identify your daily goals for nutrients listed on the label. Shopping Avoid buying canned, pre-made, or processed foods. These foods tend to be high in fat, sodium, and added sugar. Shop around the outside edge of the grocery store. This is where you will most often find fresh fruits and vegetables, bulk grains, fresh meats, and fresh dairy products. Cooking Use low-heat cooking methods, such as baking, instead of high-heat cooking methods, such as deep frying. Cook using healthy oils, such as olive, canola, or sunflower oil. Avoid cooking with butter, cream, or high-fat meats. Meal planning Eat meals and snacks regularly, preferably at the same times every day. Avoid going long periods of time without eating. Eat foods that are high in fiber, such as fresh fruits, vegetables, beans, and whole grains. Eat 4-6 oz (112-168 g) of lean protein each day, such as lean meat, chicken, fish, eggs, or tofu. One ounce (oz) (28 g) of lean protein is equal to: 1 oz (28 g) of meat, chicken, or fish. 1 egg.  cup (62 g) of tofu. Eat some foods each day that contain healthy fats, such as avocado, nuts, seeds, and fish. What foods should I eat? Fruits Berries. Apples. Oranges. Peaches. Apricots. Plums. Grapes. Mangoes. Papayas. Pomegranates. Kiwi. Cherries. Vegetables Leafy greens, including lettuce, spinach, kale, chard, collard greens, mustard greens, and cabbage. Beets. Cauliflower. Broccoli. Carrots. Green beans. Tomatoes. Peppers. Onions. Cucumbers. Brussels sprouts. Grains Whole grains, such as whole-wheat or whole-grain bread, crackers, tortillas, cereal, and pasta. Unsweetened oatmeal. Quinoa. Brown or wild  rice. Meats and other proteins Seafood. Poultry without skin. Lean cuts of poultry and beef. Tofu. Nuts. Seeds. Dairy Low-fat or fat-free dairy products such as milk, yogurt, and cheese. The items listed above may not be a complete list of foods and beverages you can eat and drink. Contact a dietitian for more information. What foods should I avoid? Fruits Fruits canned with syrup. Vegetables Canned vegetables. Frozen vegetables with butter or cream sauce. Grains Refined white flour and flour products such as bread, pasta, snack foods, and cereals. Avoid all processed foods. Meats and other proteins Fatty cuts of meat. Poultry with skin. Breaded or fried meats. Processed meat. Avoid saturated fats. Dairy Full-fat yogurt, cheese, or milk. Beverages Sweetened drinks, such as soda or iced tea. The items listed above may not be a complete list of foods and beverages you should avoid. Contact a dietitian for more information. Questions to ask a health care provider Do I need to meet with a certified diabetes care and education specialist? Do I need to meet with a dietitian? What number can I call if I have questions? When  are the best times to check my blood glucose? Where to find more information: American Diabetes Association: diabetes.org Academy of Nutrition and Dietetics: eatright.[employer] of Diabetes and Digestive and Kidney Diseases: [employer]is Association of Diabetes Care & Education Specialists: diabeteseducator.org Summary It is important to have healthy eating habits because your blood sugar (glucose) levels are greatly affected by what you eat and drink. It is important to use alcohol carefully. A healthy meal plan will help you manage your blood glucose and lower your risk of heart disease. Your health care provider may recommend that you work with a dietitian to make a meal plan that is best for you. This information is not intended to replace advice given  to you by your health care provider. Make sure you discuss any questions you have with your health care provider. Document Revised: 11/21/2019 Document Reviewed: 11/21/2019 Elsevier Patient Education  2022 [place], [place] [facility]

## 2021-06-04 NOTE — Assessment & Plan Note (Signed)
Well-controlled hypertension.  Continue amlodipine 10 mg daily. Diabetes much improved with hemoglobin A1c of 7.2. Continue metformin 500 mg twice a day, Jardiance 10 mg daily. Will increase Trulicity to 1.5 mg weekly. Diet and nutrition discussed. Follow-up in 3 months.

## 2021-06-15 ENCOUNTER — Telehealth: Payer: Self-pay | Admitting: Emergency Medicine

## 2021-06-15 NOTE — Telephone Encounter (Signed)
Patient calling in  Had 33month fu OV 02.02.23  Patient says he didn't have any blood work done to check his cholesterol levels etc & would like to have them checked  Please place lab orders & let patient know when he can schedule lab appt [phone]

## 2021-06-16 ENCOUNTER — Other Ambulatory Visit: Payer: Self-pay | Admitting: Emergency Medicine

## 2021-06-16 DIAGNOSIS — E1169 Type 2 diabetes mellitus with other specified complication: Secondary | ICD-10-CM

## 2021-06-16 DIAGNOSIS — E1159 Type 2 diabetes mellitus with other circulatory complications: Secondary | ICD-10-CM

## 2021-06-16 DIAGNOSIS — I152 Hypertension secondary to endocrine disorders: Secondary | ICD-10-CM

## 2021-06-16 DIAGNOSIS — E785 Hyperlipidemia, unspecified: Secondary | ICD-10-CM

## 2021-06-16 NOTE — Telephone Encounter (Signed)
Called and spoke with pt, made lab OV.

## 2021-06-16 NOTE — Telephone Encounter (Signed)
Lab orders for CMP and lipid profile placed today. Thanks.

## 2021-06-17 ENCOUNTER — Other Ambulatory Visit: Payer: 59

## 2021-06-19 ENCOUNTER — Other Ambulatory Visit: Payer: Self-pay

## 2021-06-19 ENCOUNTER — Other Ambulatory Visit (INDEPENDENT_AMBULATORY_CARE_PROVIDER_SITE_OTHER): Payer: 59

## 2021-06-19 ENCOUNTER — Ambulatory Visit (INDEPENDENT_AMBULATORY_CARE_PROVIDER_SITE_OTHER): Payer: 59 | Admitting: Surgical

## 2021-06-19 DIAGNOSIS — M545 Low back pain, unspecified: Secondary | ICD-10-CM

## 2021-06-19 DIAGNOSIS — E1159 Type 2 diabetes mellitus with other circulatory complications: Secondary | ICD-10-CM | POA: Diagnosis not present

## 2021-06-19 DIAGNOSIS — E785 Hyperlipidemia, unspecified: Secondary | ICD-10-CM | POA: Diagnosis not present

## 2021-06-19 DIAGNOSIS — M1712 Unilateral primary osteoarthritis, left knee: Secondary | ICD-10-CM | POA: Diagnosis not present

## 2021-06-19 DIAGNOSIS — E1169 Type 2 diabetes mellitus with other specified complication: Secondary | ICD-10-CM | POA: Diagnosis not present

## 2021-06-19 DIAGNOSIS — I152 Hypertension secondary to endocrine disorders: Secondary | ICD-10-CM | POA: Diagnosis not present

## 2021-06-19 LAB — COMPREHENSIVE METABOLIC PANEL WITH GFR
ALT: 54 U/L — ABNORMAL HIGH (ref 0–53)
AST: 26 U/L (ref 0–37)
Albumin: 4.9 g/dL (ref 3.5–5.2)
Alkaline Phosphatase: 44 U/L (ref 39–117)
BUN: 16 mg/dL (ref 6–23)
CO2: 34 meq/L — ABNORMAL HIGH (ref 19–32)
Calcium: 9.9 mg/dL (ref 8.4–10.5)
Chloride: 102 meq/L (ref 96–112)
Creatinine, Ser: 1.17 mg/dL (ref 0.40–1.50)
GFR: 72.03 mL/min
Glucose, Bld: 135 mg/dL — ABNORMAL HIGH (ref 70–99)
Potassium: 4.1 meq/L (ref 3.5–5.1)
Sodium: 139 meq/L (ref 135–145)
Total Bilirubin: 1.1 mg/dL (ref 0.2–1.2)
Total Protein: 7.7 g/dL (ref 6.0–8.3)

## 2021-06-19 LAB — LIPID PANEL
Cholesterol: 144 mg/dL (ref 0–200)
HDL: 40.2 mg/dL
LDL Cholesterol: 77 mg/dL (ref 0–99)
NonHDL: 103.88
Total CHOL/HDL Ratio: 4
Triglycerides: 135 mg/dL (ref 0.0–149.0)
VLDL: 27 mg/dL (ref 0.0–40.0)

## 2021-06-20 ENCOUNTER — Encounter: Payer: Self-pay | Admitting: Surgical

## 2021-06-20 MED ORDER — LIDOCAINE HCL 1 % IJ SOLN
5.0000 mL | INTRAMUSCULAR | Status: AC | PRN
  Administered 2021-06-19: 5 mL

## 2021-06-20 MED ORDER — BUPIVACAINE HCL 0.25 % IJ SOLN
4.0000 mL | INTRAMUSCULAR | Status: AC | PRN
  Administered 2021-06-19: 4 mL via INTRA_ARTICULAR

## 2021-06-20 MED ORDER — METHYLPREDNISOLONE ACETATE 40 MG/ML IJ SUSP
40.0000 mg | INTRAMUSCULAR | Status: AC | PRN
  Administered 2021-06-19: 40 mg via INTRA_ARTICULAR

## 2021-06-20 NOTE — Progress Notes (Signed)
Office Visit Note   [name]           Date of Birth: [birth year]           MRN: [id number] Visit Date: 06/19/2021 Requested by: Georgina Quint, MD [address] [place],  [place] [address] PCP: Georgina Quint, MD  Subjective: Chief Complaint  Patient presents with   Left Knee - Follow-up    HPI: Scott [name] is a 51 y.o. male who presents to the office complaining of left knee pain.  Patient complains of anterior left knee pain.  He had previous injection in the left knee with Toradol in early January with good but short-lived relief of his knee pain.  Denies any recent injuries.  Localizes all of his pain to the anterior and medial aspects of the left knee.  No groin pain or radicular pain.  Does have occasional low back pain.  He was previously referred to Dr. Alvester Morin for L-spine facet injections but states that he never heard from Dr. Alvester Morin regarding scheduling an appointment for the injections..                ROS: All systems reviewed are negative as they relate to the chief complaint within the history of present illness.  Patient denies fevers or chills.  Assessment & Plan: Visit Diagnoses:  1. Unilateral primary osteoarthritis, left knee   2. Low back pain, unspecified back pain laterality, unspecified chronicity, unspecified whether sciatica present     Plan: Patient is a 51 year old male who presents for evaluation of left knee pain.  He has history of left knee osteoarthritis with previous arthroscopy in late 2022 that demonstrated more advanced arthritis than was suggested by imaging studies.  He has recurrence of effusion and continued pain despite Toradol injection in early January.  He would like to try a cortisone injection today with aspiration of the knee effusion.  Knee aspirated of 31 cc of inflammatory synovial fluid without evidence of purulence.  Cortisone injection successfully administered and patient tolerated the  procedure well.  Discussed the concept of osteoarthritis of the knee in general and after discussion, patient would like to try physical therapy for 1 visit to design home exercise program that is arthritis friendly for his knee pain.  Also plan to refer patient to Dr. Naaman [name] for lumbar spine facet injections as he was referred in the past but never heard back regarding scheduling.  Follow-up with the office as needed and he can receive these injections every 3 to 4 months as needed.  He wants to hold off on any knee replacement for now which is reasonable.  Follow-Up Instructions: No follow-ups on file.   Orders:  Orders Placed This Encounter  Procedures   Ambulatory referral to Physical Therapy   Ambulatory referral to Physical Medicine Rehab   No orders of the defined types were placed in this encounter.     Procedures: Large Joint Inj: L knee on 06/19/2021 7:42 PM Indications: diagnostic evaluation, joint swelling and pain Details: 18 G 1.5 in needle, superolateral approach  Arthrogram: No  Medications: 5 mL lidocaine 1 %; 40 mg methylPREDNISolone acetate 40 MG/ML; 4 mL bupivacaine 0.25 % Aspirate: 31 mL Outcome: tolerated well, no immediate complications Procedure, treatment alternatives, risks and benefits explained, specific risks discussed. Consent was given by the patient. Immediately prior to procedure a time out was called to verify the correct patient, procedure, equipment, support staff and site/side marked as required.  Patient was prepped and draped in the usual sterile fashion.      Clinical Data: No additional findings.  Objective: Vital Signs: There were no vitals taken for this visit.  Physical Exam:  Constitutional: Patient appears well-developed HEENT:  Head: Normocephalic Eyes:EOM are normal Neck: Normal range of motion Cardiovascular: Normal rate Pulmonary/chest: Effort normal Neurologic: Patient is alert Skin: Skin is warm Psychiatric: Patient  has normal mood and affect  Ortho Exam: Ortho exam demonstrates left knee with positive effusion.  Tenderness over the medial joint line.  No tenderness over the lateral joint line.  0 degrees extension and greater than 90 degrees of knee flexion.  No calf tenderness.  Negative Homans' sign.  No pain with hip range of motion.  Able to perform straight leg raise with no extensor lag.  Specialty Comments:  No specialty comments available.  Imaging: No results found.   PMFS History: Patient Active Problem List   Diagnosis Date Noted   GERD (gastroesophageal reflux disease) 03/31/2021   Acute medial meniscal tear, left, subsequent encounter    History of colonic polyps 11/21/2020   Posterior calcaneal exostosis 07/21/2020   Trigger thumb of right hand 07/03/2020   Hyperlipidemia 04/23/2020   Class 2 severe obesity due to excess calories with serious comorbidity and body mass index (BMI) of 37.0 to 37.9 in adult [facility]) 02/13/2019   Sleep apnea 01/23/2019   Arthritis 01/23/2019   Chronic diastolic heart failure (HCC) 11/07/2016   OSA (obstructive sleep apnea) 04/18/2012   Left ventricular diastolic dysfunction, NYHA class 1/moderate LVH with concentric hypertrophy 09/03/2011   ? DVT of popliteal vein, right (non occlusive) 09/03/2011   Pulmonary embolism (HCC) 09/02/2011   Dyslipidemia associated with type 2 diabetes mellitus (HCC) 09/02/2011   Hypertension associated with diabetes (HCC) 09/02/2011   Past Medical History:  Diagnosis Date   Arthritis    Carpal tunnel syndrome    Chronic migraine 01/23/2019   Diabetes mellitus    Diastolic heart failure (HCC)    GERD (gastroesophageal reflux disease)    Hypertension    Obesity    Pulmonary embolism (HCC) 2013   He has a history of HTN.  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 A repeat scan done in 2017 did not demonstrate evidence of emboli. Neither study demonstrated any coronary calcifications.     Shoulder pain, bilateral    Sleep apnea    was fitted for mask and never got machine - too costly    Family History  Problem Relation Age of Onset   Hypertension Mother    Kidney disease Father    Hypertension Other    Hyperlipidemia Other    Diabetes Other     Past Surgical History:  Procedure Laterality Date   CARPAL TUNNEL RELEASE Right 12/04/2015   Procedure: RIGHT CARPAL TUNNEL RELEASE;  Surgeon: Betha Loa, MD;  Location: [facility];  Service: Orthopedics;  Laterality: Right;  RIGHT CARPAL TUNNEL RELEASE   CARPAL TUNNEL RELEASE Right 12/2015   [facility]   CARPAL TUNNEL RELEASE Left 04/01/2016   Procedure: LEFT CARPAL TUNNEL RELEASE;  Surgeon: Betha Loa, MD;  Location: [facility];  Service: Orthopedics;  Laterality: Left;   I & D EXTREMITY Left 02/19/2021   Procedure: LEFT FOOT SPUR REMOVAL;  Surgeon: Cammy Copa, MD;  Location: [facility] OR;  Service: Orthopedics;  Laterality: Left;   KNEE ARTHROSCOPY WITH MEDIAL MENISECTOMY Left 02/19/2021   Procedure: LEFT KNEE ARTHROSCOPY, MENISCAL CYST  DECOMPRESSION;  Surgeon: Cammy Copa, MD;  Location: [facility] OR;  Service: Orthopedics;  Laterality: Left;   right hand surgery     ROTATOR CUFF REPAIR Right    Social History   Occupational History   Occupation: real estate agent  Tobacco Use   Smoking status: Never   Smokeless tobacco: Never  Vaping Use   Vaping Use: Never used  Substance and Sexual Activity   Alcohol use: No    Alcohol/week: 0.0 standard drinks   Drug use: No   Sexual activity: Yes

## 2021-06-24 ENCOUNTER — Telehealth: Payer: Self-pay | Admitting: Physical Medicine and Rehabilitation

## 2021-06-24 NOTE — Telephone Encounter (Signed)
Patient returned call asked for a call back. # [phone]

## 2021-06-30 ENCOUNTER — Telehealth: Payer: Self-pay | Admitting: Emergency Medicine

## 2021-06-30 ENCOUNTER — Ambulatory Visit: Payer: 59 | Admitting: Physical Medicine and Rehabilitation

## 2021-06-30 NOTE — Telephone Encounter (Signed)
Pt requesting a c/b for 06-19-2021 lab result  Pt states he has a possible sinus infection x1w, pt seeking advise on medication for infection

## 2021-07-02 ENCOUNTER — Telehealth: Payer: Self-pay

## 2021-07-02 ENCOUNTER — Other Ambulatory Visit: Payer: Self-pay | Admitting: Emergency Medicine

## 2021-07-02 ENCOUNTER — Telehealth: Payer: Self-pay | Admitting: Physical Medicine and Rehabilitation

## 2021-07-02 DIAGNOSIS — J0100 Acute maxillary sinusitis, unspecified: Secondary | ICD-10-CM

## 2021-07-02 MED ORDER — AZITHROMYCIN 250 MG PO TABS: ORAL_TABLET | ORAL | 0 refills | Status: DC

## 2021-07-02 NOTE — Telephone Encounter (Signed)
Pt called to reschedule appt. Please call pt at [phone]. ?

## 2021-07-02 NOTE — Telephone Encounter (Signed)
Find out who ordered these tests on 06/19/2021.  Thanks.

## 2021-07-02 NOTE — Telephone Encounter (Signed)
I will send prescription for azithromycin to his pharmacy of record.  Thanks

## 2021-07-02 NOTE — Telephone Encounter (Signed)
NEW NOTE NOT NEEDED °

## 2021-07-02 NOTE — Telephone Encounter (Signed)
Patient wanting to know results to x ray and labs from 06/19/21. Also, pt would like to request suggestions for sinus infection. ?

## 2021-07-02 NOTE — Telephone Encounter (Signed)
Sent pt my chart message to let him know where to get x-ray results. ?

## 2021-07-02 NOTE — Telephone Encounter (Signed)
Pt is calling in requesting more information about his recent lab work that Dr. Alvy Bimler ordered. Pt asked why would his CO2 be elevated and getting Dr. Alvy Bimler insight on this result. ? ?Pt states that he hasnt had an Xray and doesn't have any questions about an xray just the CO2 lab result. ? ?Pt CB [phone] ?

## 2021-07-02 NOTE — Telephone Encounter (Signed)
Lipid profile was normal.  CMP shows elevated CO2 which is secondary to dehydration.  Needs to continue taking medications as prescribed and pay more attention to his hydration status.  No concerns.  Thanks.

## 2021-07-07 ENCOUNTER — Ambulatory Visit: Payer: 59 | Admitting: Physical Therapy

## 2021-07-07 ENCOUNTER — Encounter: Payer: Self-pay | Admitting: Physical Therapy

## 2021-07-07 ENCOUNTER — Other Ambulatory Visit: Payer: Self-pay

## 2021-07-07 DIAGNOSIS — M25662 Stiffness of left knee, not elsewhere classified: Secondary | ICD-10-CM

## 2021-07-07 DIAGNOSIS — M25562 Pain in left knee: Secondary | ICD-10-CM

## 2021-07-07 DIAGNOSIS — G8929 Other chronic pain: Secondary | ICD-10-CM

## 2021-07-07 NOTE — Therapy (Signed)
?OUTPATIENT PHYSICAL THERAPY LOWER EXTREMITY EVALUATION ? ? ?Patient Name: Scott [name] ?MRN: [id number] ?DOB:06/02/1970, 51 y.o., male ?Today's Date: 07/07/2021 ? ? PT End of Session - 07/07/21 0930   ? ? Visit Number 1   ? Date for PT Re-Evaluation 08/04/21   ? PT Start Time 0845   ? PT Stop Time 0925   ? PT Time Calculation (min) 40 min   ? Activity Tolerance Patient tolerated treatment well   ? Behavior During Therapy [facility] for tasks assessed/performed   ? ?  ?  ? ?  ? ? ?Past Medical History:  ?Diagnosis Date  ? Arthritis   ? Carpal tunnel syndrome   ? Chronic migraine 01/23/2019  ? Diabetes mellitus   ? Diastolic heart failure ([place])   ? GERD (gastroesophageal reflux disease)   ? Hypertension   ? Obesity   ? Pulmonary embolism ([place]) 2013  ? He has a history of HTN.  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 A repeat scan done in 2017 did not demonstrate evidence of emboli. Neither study demonstrated any coronary calcifications.   ? Shoulder pain, bilateral   ? Sleep apnea   ? was fitted for mask and never got machine - too costly  ? ?Past Surgical History:  ?Procedure Laterality Date  ? CARPAL TUNNEL RELEASE Right 12/04/2015  ? Procedure: RIGHT CARPAL TUNNEL RELEASE;  Surgeon: Leanora Cover, MD;  Location: [place];  Service: Orthopedics;  Laterality: Right;  RIGHT CARPAL TUNNEL RELEASE  ? CARPAL TUNNEL RELEASE Right 12/2015  ? [place]  ? CARPAL TUNNEL RELEASE Left 04/01/2016  ? Procedure: LEFT CARPAL TUNNEL RELEASE;  Surgeon: Leanora Cover, MD;  Location: [place];  Service: Orthopedics;  Laterality: Left;  ? I & D EXTREMITY Left 02/19/2021  ? Procedure: LEFT FOOT SPUR REMOVAL;  Surgeon: Meredith Pel, MD;  Location: [place];  Service: Orthopedics;  Laterality: Left;  ? KNEE ARTHROSCOPY WITH MEDIAL MENISECTOMY Left 02/19/2021  ? Procedure: LEFT KNEE ARTHROSCOPY, MENISCAL CYST DECOMPRESSION;  Surgeon: Meredith Pel, MD;  Location: [place];   Service: Orthopedics;  Laterality: Left;  ? right hand surgery    ? ROTATOR CUFF REPAIR Right   ? ?Patient Active Problem List  ? Diagnosis Date Noted  ? GERD (gastroesophageal reflux disease) 03/31/2021  ? Acute medial meniscal tear, left, subsequent encounter   ? History of colonic polyps 11/21/2020  ? Posterior calcaneal exostosis 07/21/2020  ? Trigger thumb of right hand 07/03/2020  ? Hyperlipidemia 04/23/2020  ? Class 2 severe obesity due to excess calories with serious comorbidity and body mass index (BMI) of 37.0 to 37.9 in adult [facility]) 02/13/2019  ? Sleep apnea 01/23/2019  ? Arthritis 01/23/2019  ? Chronic diastolic heart failure ([place]) 11/07/2016  ? OSA (obstructive sleep apnea) 04/18/2012  ? Left ventricular diastolic dysfunction, NYHA class 1/moderate LVH with concentric hypertrophy 09/03/2011  ? ? DVT of popliteal vein, right (non occlusive) 09/03/2011  ? Pulmonary embolism ([place]) 09/02/2011  ? Dyslipidemia associated with type 2 diabetes mellitus ([place]) 09/02/2011  ? Hypertension associated with diabetes ([place]) 09/02/2011  ? ? ?PCP: Horald Pollen, MD ? ?REFERRING PROVIDER: Meredith Pel, MD ? ?REFERRING DIAG: M54.50 (ICD-10-CM) - Low back pain, unspecified back pain laterality, unspecified chronicity, unspecified whether sciatica present  ?1 PT visit to establish OA friendly HEP for left knee  ? ? ?THERAPY DIAG:  ?Chronic pain of left knee - Plan: PT plan of care  cert/re-cert ? ?Stiffness of left knee, not elsewhere classified - Plan: PT plan of care cert/re-cert ? ?ONSET DATE: arthroscopy on 02/19/21 ? ?SUBJECTIVE:  ? ?SUBJECTIVE STATEMENT: ?Pt is a 51 y/o male who presents to OPPT for Lt knee pain.  He had knee arthroscopy on 02/19/21, and determined he had more OA than originally expected.  He is here today to establish HEP (has bike and total gym at home).  He had recent injection in knee. ? ?PERTINENT HISTORY: ?OA, migraines, DM, CHF, HTN, obesity, hx PE, OSA ? ?PAIN:  ?Are you having  pain? No ?NPRS scale: 0/10, up to 710 ?Pain location: knee ?Pain orientation: Left  ?PAIN TYPE: chronic ?Pain description: aching  ?Aggravating factors: unsure, walking (first few steps especially) ?Relieving factors: cortisone injection, ice ? ?PRECAUTIONS: None ? ?WEIGHT BEARING RESTRICTIONS No ? ?FALLS:  ?Has patient fallen in last 6 months? No, Number of falls: N/A ? ?LIVING ENVIRONMENT: ?Lives with: lives with their family (wife and 2 daughters - independent) ?Lives in: House/apartment ?Stairs: Yes; Internal: 16 steps; on left going up and External: 4 steps; on right going up ?Has following equipment at home: None ? ?OCCUPATION: Full Time real estate agent (walking, driving, bending) ? ?PLOF: Independent and Leisure: shopping, walking ? ?PATIENT GOALS get exercises to help with knee ? ? ?OBJECTIVE:  ? ?DIAGNOSTIC FINDINGS: He has history of left knee osteoarthritis with previous arthroscopy in late 2022 that demonstrated more advanced arthritis than was suggested by imaging studies.  ? ?PATIENT SURVEYS:  ?Deferred as likely 1x eval ? ?COGNITION: ? Overall cognitive status: Within functional limits for tasks assessed   ? ?MUSCLE LENGTH: ?Hamstrings: no significant tightness ?Quads: tightness on Lt ? ? ?LE AROM/PROM: ? ?AROM Right ?07/07/2021 Left ?07/07/2021  ?Knee flexion 132 125  ?Knee extension  0  ? (Blank rows = not tested) ? ?LE MMT: ? ?MMT Right ?07/07/2021 Left ?07/07/2021  ?Knee flexion 5/5 5/5  ?Knee extension 5/5 5/5  ? (Blank rows = not tested) ? ? ?GAIT: ?Independent without significant deviations ? ? ? ?TODAY'S TREATMENT: ?See HEP - reviewed and discussed alternative options - exercise classes with virtual reality, pool exercises ? ? ?PATIENT EDUCATION:  ?Education details: HEP ?Person educated: Patient ?Education method: Explanation, Demonstration, and Handouts ?Education comprehension: verbalized understanding ? ? ?HOME EXERCISE PROGRAM: ?Access Code: [email or web address] ?URL:  [email or web address] ?Date: 07/07/2021 ?Prepared by: Faustino Congress ? ?Exercises ?Single Leg Press - 1 x daily - 7 x weekly - 3 sets - 10 reps ?Full Leg Press - 1 x daily - 7 x weekly - 3 sets - 10 reps ?Kettlebell Deadlift - 1 x daily - 7 x weekly - 3 sets - 10 reps ?Mini Squat - 1 x daily - 7 x weekly - 3 sets - 10 reps ?Lateral Lunge - 1 x daily - 7 x weekly - 3 sets - 10 reps ?Heel Raises with Counter Support - 1 x daily - 7 x weekly - 3 sets - 10 reps ?Prone Quadriceps Stretch with Strap - 1 x daily - 7 x weekly - 1 sets - 3 reps - 30 sec hold ? ? ?ASSESSMENT: ? ?CLINICAL IMPRESSION: ?Patient is a 51 y.o. male who was seen today for physical therapy evaluation and treatment for Lt knee pain.  Plan to work on HEP at home and pt will call if additional PT is needed.  ? ? ?OBJECTIVE IMPAIRMENTS decreased ROM, impaired flexibility, and pain.  ? ?ACTIVITY LIMITATIONS community activity, driving, occupation, yard work, and  shopping.  ? ?PERSONAL FACTORS 3+ comorbidities: OA, migraines, DM, CHF, HTN, obesity, hx PE, OSA  are also affecting patient's functional outcome.  ? ? ?REHAB POTENTIAL: Excellent ? ?CLINICAL DECISION MAKING: Stable/uncomplicated ? ?EVALUATION COMPLEXITY: Low ? ? ?GOALS: ?Goals reviewed with patient? Yes ? ?SHORT TERM GOALS: ? ?To be determined if pt returns ? ?LONG TERM GOALS: ? ?To be determined if pt returns ? ?PLAN: ?PT FREQUENCY: one time visit; will determine freq if pt needs to return ? ?PT DURATION: 4 weeks ? ?PLANNED INTERVENTIONS: Therapeutic exercises, Therapeutic activity, Neuromuscular re-education, Patient/Family education, Joint mobilization, Aquatic Therapy, Dry Needling, Cryotherapy, Moist heat, Taping, and Manual therapy ? ?PLAN FOR NEXT SESSION: reassess if pt returns, otherwise d/c ? ? ? ? ? ?Laureen Abrahams, PT, DPT ?07/07/21 9:32 AM ? ? ? ?

## 2021-07-08 ENCOUNTER — Ambulatory Visit (INDEPENDENT_AMBULATORY_CARE_PROVIDER_SITE_OTHER): Payer: 59 | Admitting: Physical Medicine and Rehabilitation

## 2021-07-08 ENCOUNTER — Encounter: Payer: Self-pay | Admitting: Physical Medicine and Rehabilitation

## 2021-07-08 VITALS — BP 142/87 | HR 102

## 2021-07-08 DIAGNOSIS — M47816 Spondylosis without myelopathy or radiculopathy, lumbar region: Secondary | ICD-10-CM

## 2021-07-08 DIAGNOSIS — G8929 Other chronic pain: Secondary | ICD-10-CM

## 2021-07-08 DIAGNOSIS — M545 Low back pain, unspecified: Secondary | ICD-10-CM

## 2021-07-08 NOTE — Progress Notes (Signed)
Pt state lower back pain that travels to his right thigh. Pt state laying down, standing and walking makes the pain worse. Pt state he uses pain cream and ice to help ease his pain. ? ?Numeric Pain Rating Scale and Functional Assessment ?Average Pain 9 ?Pain Right Now 2 ?My pain is intermittent, burning, dull, stabbing, and aching ?Pain is worse with: walking, sitting, some activites, and laying down ?Pain improves with: heat/ice ? ? ?In the last MONTH (on 0-10 scale) has pain interfered with the following? ? ?1. General activity like being  able to carry out your everyday physical activities such as walking, climbing stairs, carrying groceries, or moving a chair?  ?Rating(6) ? ?2. Relation with others like being able to carry out your usual social activities and roles such as  activities at home, at work and in your community. ?Rating(7) ? ?3. Enjoyment of life such that you have  been bothered by emotional problems such as feeling anxious, depressed or irritable?  ?Rating(8) ? ?

## 2021-07-08 NOTE — Progress Notes (Signed)
Scott [name] - 51 y.o. male MRN [id number]  Date of birth: [birth year]  Office Visit Note: Visit Date: 07/08/2021 PCP: Horald Pollen, MD Referred by: Horald Pollen, *  Subjective: Chief Complaint  Patient presents with   Lower Back - Pain   Right Thigh - Pain   HPI: Scott [name] is a 51 y.o. male who comes in today at the request of Dr. Alphonzo Severance for evaluation of chronic, worsening and severe bilateral lower back pain.  Patient reports pain has been ongoing for several years.  Patient states his pain is exacerbated by moving from a sitting to standing position, bending over and prolonged sitting.  Patient reports some relief of pain with home exercise regimen, use of topical pain creams and rest.  Patient has attended formal physical therapy on multiple occassions in the past and reports no relief of pain with these treatments.  Patient's lumbar MRI from 2021 exhibits left subarticular disc protrusion at L4-L5 causing mild canal stenosis and left lateral recess narrowing potentially affecting the descending left L5 nerve root.  There is moderate bilateral facet arthropathy at L4-L5 and L5-S1.  No high-grade spinal canal stenosis noted.  Patient was previously treated by Dr. Karlton Lemon at [place] and did have multiple intra-articular facet joint injections performed at [facility] with little to no relief of pain. 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 He reports severe pain is making job duties difficult. Patient denies focal weakness, numbness and tingling. Patient denies recent trauma or falls.   Review of Systems  Musculoskeletal:  Positive for back pain.  Neurological:  Negative for tingling, sensory change, focal weakness and weakness.  All other systems reviewed and are negative. Otherwise per HPI.  Assessment & Plan: Visit Diagnoses:    ICD-10-CM   1. Chronic bilateral low  back pain without sciatica  M54.50 Ambulatory referral to Physical Medicine Rehab   G89.29     2. Spondylosis without myelopathy or radiculopathy, lumbar region  M47.816     3. Facet arthropathy, lumbar  M47.816        Plan: Findings:  Chronic, worsening and severe bilateral lower back pain. Patient continues to have excruciating and debilitating pain despite good conservative therapies such as formal physical therapy, home exercise program, use of medications and rest. Patients clinical presentation and exam are consistent with facet mediated pain. He does have pain with facet loading/lumbar extension. We believe the next step is to perform diagnostic and hopefully therapeutic bilateral L4-L5 facet joint/medial branch blocks under fluoroscopic guidance. If patient gets good relief with facet joint injections we did discuss the possibility of longer sustained pain relief with radiofrequency ablation procedure. I did provide patient with educational material regarding radiofrequency ablation procedure for him to take home and review and also discussed ablation procedure in detail. He has no questions at this time. We feel that we can get patient in quickly for facet joint blocks. No red flag symptoms noted upon exam today.    Meds & Orders: No orders of the defined types were placed in this encounter.   Orders Placed This Encounter  Procedures   Ambulatory referral to Physical Medicine Rehab    Follow-up: Return for Bilateral L4-L5 facet joint/medial branch blocks.   Procedures: No procedures performed      Clinical History: EXAM: MRI LUMBAR SPINE WITHOUT CONTRAST   TECHNIQUE: Multiplanar, multisequence MR imaging of the lumbar spine  was performed. No intravenous contrast was administered.   COMPARISON:  Prior radiograph from 01/16/2020 and MRI from 02/05/2017.   FINDINGS: Segmentation:  Standard.   Alignment:  Physiologic.  No listhesis.   Vertebrae: Vertebral body height  maintained without acute or chronic fracture. Bone marrow signal intensity within normal limits. No discrete or worrisome osseous lesions. Mild reactive marrow edema noted about the right greater than left L4-5 facets due to facet arthritis. No other abnormal marrow edema.   Conus medullaris and cauda equina: Conus extends to the L1 level. Conus and cauda equina appear normal.   Paraspinal and other soft tissues: Unremarkable.   Disc levels:   L1-2:  Unremarkable.   L2-3:  Unremarkable.   L3-4: Normal interspace. Minimal facet spurring. No canal or foraminal stenosis. No impingement.   L4-5: Disc desiccation. Shallow broad-based left subarticular disc protrusion indents the left ventral thecal sac, encroaching upon the left lateral recess. Associated central annular fissure. Moderate bilateral facet hypertrophy, mildly progressed from previous. Resultant mild canal with moderate left lateral recess stenosis, descending L5 nerve root level. Mild to moderate bilateral L4 foraminal narrowing. Appearance is overall relatively similar to previous.   L5-S1: Normal interspace. Moderate right with mild left facet hypertrophy, mildly progressed from previous. No stenosis or impingement.   IMPRESSION: 1. Shallow left subarticular disc protrusion at L4-5 with resultant mild canal and moderate left lateral recess stenosis, potentially affecting the descending left L5 nerve root. 2. Moderate bilateral facet hypertrophy at L4-5 and L5-S1, mildly progressed from previous, and could contribute to lower back pain.     Electronically Signed   By: Jeannine Boga M.D.   On: 01/27/2020 01:46   He reports that he has never smoked. He has never used smokeless tobacco.  Recent Labs    11/20/20 1353 02/12/21 1357 06/04/21 1454  HGBA1C 8.4* 7.9* 7.2*    Objective:  VS:  HT:     WT:    BMI:      BP:(!) 142/87   HR:(!) 102bpm   TEMP: ( )   RESP:  Physical Exam Vitals and nursing  note reviewed.  HENT:     Head: Normocephalic and atraumatic.     Right Ear: External ear normal.     Left Ear: External ear normal.     Nose: Nose normal.     Mouth/Throat:     Mouth: Mucous membranes are moist.  Eyes:     Extraocular Movements: Extraocular movements intact.  Cardiovascular:     Rate and Rhythm: Normal rate.     Pulses: Normal pulses.  Pulmonary:     Effort: Pulmonary effort is normal.  Abdominal:     General: Abdomen is flat. There is no distension.  Musculoskeletal:        General: Tenderness present.     Cervical back: Normal range of motion.     Comments: Pt is slow to rise from seated position to standing. Concordant low back pain with facet loading, lumbar spine extension and rotation. Strong distal strength without clonus, no pain upon palpation of greater trochanters. Sensation intact bilaterally. Walks independently, gait steady.   Skin:    General: Skin is warm and dry.     Capillary Refill: Capillary refill takes less than 2 seconds.  Neurological:     General: No focal deficit present.     Mental Status: He is alert and oriented to person, place, and time.  Psychiatric:        Mood and Affect: Mood  normal.        Behavior: Behavior normal.    Ortho Exam  Imaging: No results found.  Past Medical/Family/Surgical/Social History: Medications & Allergies reviewed per EMR, new medications updated. Patient Active Problem List   Diagnosis Date Noted   GERD (gastroesophageal reflux disease) 03/31/2021   Acute medial meniscal tear, left, subsequent encounter    History of colonic polyps 11/21/2020   Posterior calcaneal exostosis 07/21/2020   Trigger thumb of right hand 07/03/2020   Hyperlipidemia 04/23/2020   Class 2 severe obesity due to excess calories with serious comorbidity and body mass index (BMI) of 37.0 to 37.9 in adult [facility]) 02/13/2019   Sleep apnea 01/23/2019   Arthritis 01/23/2019   Chronic diastolic heart failure ([place]) 11/07/2016    OSA (obstructive sleep apnea) 04/18/2012   Left ventricular diastolic dysfunction, NYHA class 1/moderate LVH with concentric hypertrophy 09/03/2011   ? DVT of popliteal vein, right (non occlusive) 09/03/2011   Pulmonary embolism ([place]) 09/02/2011   Dyslipidemia associated with type 2 diabetes mellitus ([place]) 09/02/2011   Hypertension associated with diabetes ([place]) 09/02/2011   Past Medical History:  Diagnosis Date   Arthritis    Carpal tunnel syndrome    Chronic migraine 01/23/2019   Diabetes mellitus    Diastolic heart failure ([facility])    GERD (gastroesophageal reflux disease)    Hypertension    Obesity    Pulmonary embolism ([place]) 2013   He has a history of HTN.  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 A repeat scan done in 2017 did not demonstrate evidence of emboli. Neither study demonstrated any coronary calcifications.    Shoulder pain, bilateral    Sleep apnea    was fitted for mask and never got machine - too costly   Family History  Problem Relation Age of Onset   Hypertension Mother    Kidney disease Father    Hypertension Other    Hyperlipidemia Other    Diabetes Other    Past Surgical History:  Procedure Laterality Date   CARPAL TUNNEL RELEASE Right 12/04/2015   Procedure: RIGHT CARPAL TUNNEL RELEASE;  Surgeon: Leanora Cover, MD;  Location: [place];  Service: Orthopedics;  Laterality: Right;  RIGHT CARPAL TUNNEL RELEASE   CARPAL TUNNEL RELEASE Right 12/2015   [place]   CARPAL TUNNEL RELEASE Left 04/01/2016   Procedure: LEFT CARPAL TUNNEL RELEASE;  Surgeon: Leanora Cover, MD;  Location: [place];  Service: Orthopedics;  Laterality: Left;   I & D EXTREMITY Left 02/19/2021   Procedure: LEFT FOOT SPUR REMOVAL;  Surgeon: Meredith Pel, MD;  Location: [facility];  Service: Orthopedics;  Laterality: Left;   KNEE ARTHROSCOPY WITH MEDIAL MENISECTOMY Left 02/19/2021   Procedure: LEFT KNEE ARTHROSCOPY, MENISCAL CYST  DECOMPRESSION;  Surgeon: Meredith Pel, MD;  Location: [place];  Service: Orthopedics;  Laterality: Left;   right hand surgery     ROTATOR CUFF REPAIR Right    Social History   Occupational History   Occupation: real estate agent  Tobacco Use   Smoking status: Never   Smokeless tobacco: Never  Vaping Use   Vaping Use: Never used  Substance and Sexual Activity   Alcohol use: No    Alcohol/week: 0.0 standard drinks   Drug use: No   Sexual activity: Yes

## 2021-07-14 LAB — HM DIABETES EYE EXAM

## 2021-07-22 ENCOUNTER — Ambulatory Visit (INDEPENDENT_AMBULATORY_CARE_PROVIDER_SITE_OTHER): Payer: 59 | Admitting: Orthopedic Surgery

## 2021-07-22 ENCOUNTER — Telehealth: Payer: Self-pay

## 2021-07-22 ENCOUNTER — Other Ambulatory Visit: Payer: Self-pay

## 2021-07-22 DIAGNOSIS — M545 Low back pain, unspecified: Secondary | ICD-10-CM | POA: Diagnosis not present

## 2021-07-22 DIAGNOSIS — M1712 Unilateral primary osteoarthritis, left knee: Secondary | ICD-10-CM | POA: Diagnosis not present

## 2021-07-22 NOTE — Telephone Encounter (Signed)
Auth needed for left knee gel injection  

## 2021-07-24 NOTE — Telephone Encounter (Signed)
Noted  

## 2021-07-25 ENCOUNTER — Encounter: Payer: Self-pay | Admitting: Orthopedic Surgery

## 2021-07-25 NOTE — Progress Notes (Addendum)
? ?Office Visit Note ?  ?[name]           ?Date of Birth: 09-May-1970           ?MRN: [id number] ?Visit Date: 07/22/2021 ?Requested by: Scott Quint, MD ?[address] ?[place],  [place] [address] ?PCP: Scott Quint, MD ? ?Subjective: ?Chief Complaint  ?Patient presents with  ? Left Foot - Pain  ? ? ?HPI: Scott [name] is a patient with left heel pain.  Had calcaneal spur excision done about a year ago.  Also had left knee arthroscopy with partial meniscectomy done but has fairly significant arthritis in the medial compartment at the time of arthroscopy.  Had cortisone injection recently which only helped him for about 2 weeks.  He has been doing physical therapy type exercises including stationary bike. ?             ?ROS: All systems reviewed are negative as they relate to the chief complaint within the history of present illness.  Patient denies  fevers or chills. ? ? ?Assessment & Plan: ?Visit Diagnoses:  ?1. Low back pain, unspecified back pain laterality, unspecified chronicity, unspecified whether sciatica present   ?2. Unilateral primary osteoarthritis, left knee   ? ? ?Plan: Impression is left knee pain with arthritis.  Cortisone injection did not help.  Not really a candidate for knee replacement yet.  Recommend gel injection for incremental decrease in pain.  Looked at the foot with ultrasound.  I see a small recurrence of bone spurring but no significant spur of the type that he had in the past.  Most of this is soft tissue scar tissue over the prior bone excision site.  Follow-up in 3 weeks for gel injection. ?This patient is diagnosed with osteoarthritis of the knee(s).   ? ?Radiographs show evidence of joint space narrowing, osteophytes, subchondral sclerosis and/or subchondral cysts.  This patient has knee pain which interferes with functional and activities of daily living.   ? ?This patient has experienced inadequate response, adverse effects and/or intolerance with  conservative treatments such as acetaminophen, NSAIDS, topical creams, physical therapy or regular exercise, knee bracing and/or weight loss.  ? ?This patient has experienced inadequate response or has a contraindication to intra articular steroid injections for at least 3 months.  ? ?This patient is not scheduled to have a total knee replacement within 6 months of starting treatment with viscosupplementation. ? ? ?Follow-Up Instructions: Return in about 3 weeks (around 08/12/2021).  ? ?Orders:  ?No orders of the defined types were placed in this encounter. ? ?No orders of the defined types were placed in this encounter. ? ? ? ? Procedures: ?No procedures performed ? ? ?Clinical Data: ?No additional findings. ? ?Objective: ?Vital Signs: There were no vitals taken for this visit. ? ?Physical Exam:  ? ?Constitutional: Patient appears well-developed ?HEENT:  ?Head: Normocephalic ?Eyes:EOM are normal ?Neck: Normal range of motion ?Cardiovascular: Normal rate ?Pulmonary/chest: Effort normal ?Neurologic: Patient is alert ?Skin: Skin is warm ?Psychiatric: Patient has normal mood and affect ? ? ?Ortho Exam: Ortho exam demonstrates no knee effusion on the left.  Collateral crucial ligaments are stable.  Does have medial joint line tenderness.  Range of motion is 0-1 20.  Extensor mechanism intact.  Left foot is examined.  Well-healed incision on the posterior lateral aspect of that calcaneus.  Plantarflexion strength intact.  Does have a bursal type development over the incision.  Ultrasound examination demonstrates possibly small amount of regrowth of  bone around that excision site but most of this looks like it is thickened scar tissue over the incision. ? ?Specialty Comments:  ?No specialty comments available. ? ?Imaging: ?No results found. ? ? ?PMFS History: ?Patient Active Problem List  ? Diagnosis Date Noted  ? GERD (gastroesophageal reflux disease) 03/31/2021  ? Acute medial meniscal tear, left, subsequent encounter    ? History of colonic polyps 11/21/2020  ? Posterior calcaneal exostosis 07/21/2020  ? Trigger thumb of right hand 07/03/2020  ? Hyperlipidemia 04/23/2020  ? Class 2 severe obesity due to excess calories with serious comorbidity and body mass index (BMI) of 37.0 to 37.9 in adult [facility]) 02/13/2019  ? Sleep apnea 01/23/2019  ? Arthritis 01/23/2019  ? Chronic diastolic heart failure ([facility]) 11/07/2016  ? OSA (obstructive sleep apnea) 04/18/2012  ? Left ventricular diastolic dysfunction, NYHA class 1/moderate LVH with concentric hypertrophy 09/03/2011  ? ? DVT of popliteal vein, right (non occlusive) 09/03/2011  ? Pulmonary embolism ([facility]) 09/02/2011  ? Dyslipidemia associated with type 2 diabetes mellitus (H[facility]) 09/02/2011  ? Hypertension associated with diabetes ([facility]) 09/02/2011  ? ?Past Medical History:  ?Diagnosis Date  ? Arthritis   ? Carpal tunnel syndrome   ? Chronic migraine 01/23/2019  ? Diabetes mellitus   ? Diastolic heart failure ([facility])   ? GERD (gastroesophageal reflux disease)   ? Hypertension   ? Obesity   ? Pulmonary embolism ([facility]) 2013  ? He has a history of HTN.  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 A repeat scan done in 2017 did not demonstrate evidence of emboli. Neither study demonstrated any coronary calcifications.   ? Shoulder pain, bilateral   ? Sleep apnea   ? was fitted for mask and never got machine - too costly  ?  ?Family History  ?Problem Relation Age of Onset  ? Hypertension Mother   ? Kidney disease Father   ? Hypertension Other   ? Hyperlipidemia Other   ? Diabetes Other   ?  ?Past Surgical History:  ?Procedure Laterality Date  ? CARPAL TUNNEL RELEASE Right 12/04/2015  ? Procedure: RIGHT CARPAL TUNNEL RELEASE;  Surgeon: Betha Loa, MD;  Location: [facility];  Service: Orthopedics;  Laterality: Right;  RIGHT CARPAL TUNNEL RELEASE  ? CARPAL TUNNEL RELEASE Right 12/2015  ? [facility]  ? CARPAL TUNNEL RELEASE Left 04/01/2016  ? Procedure: LEFT CARPAL  TUNNEL RELEASE;  Surgeon: Betha Loa, MD;  Location: [facility];  Service: Orthopedics;  Laterality: Left;  ? I & D EXTREMITY Left 02/19/2021  ? Procedure: LEFT FOOT SPUR REMOVAL;  Surgeon: Cammy Copa, MD;  Location: [facility];  Service: Orthopedics;  Laterality: Left;  ? KNEE ARTHROSCOPY WITH MEDIAL MENISECTOMY Left 02/19/2021  ? Procedure: LEFT KNEE ARTHROSCOPY, MENISCAL CYST DECOMPRESSION;  Surgeon: Cammy Copa, MD;  Location: [facility] OR;  Service: Orthopedics;  Laterality: Left;  ? right hand surgery    ? ROTATOR CUFF REPAIR Right   ? ?Social History  ? ?Occupational History  ? Occupation: real estate agent  ?Tobacco Use  ? Smoking status: Never  ? Smokeless tobacco: Never  ?Vaping Use  ? Vaping Use: Never used  ?Substance and Sexual Activity  ? Alcohol use: No  ?  Alcohol/week: 0.0 standard drinks  ? Drug use: No  ? Sexual activity: Yes  ? ? ? ? ? ?

## 2021-07-27 ENCOUNTER — Ambulatory Visit: Payer: 59 | Admitting: Physical Medicine and Rehabilitation

## 2021-07-27 ENCOUNTER — Encounter: Payer: Self-pay | Admitting: Physical Medicine and Rehabilitation

## 2021-07-27 ENCOUNTER — Ambulatory Visit (INDEPENDENT_AMBULATORY_CARE_PROVIDER_SITE_OTHER): Payer: 59 | Admitting: Physical Medicine and Rehabilitation

## 2021-07-27 ENCOUNTER — Other Ambulatory Visit: Payer: Self-pay

## 2021-07-27 ENCOUNTER — Ambulatory Visit: Payer: Self-pay

## 2021-07-27 VITALS — BP 120/91 | HR 93

## 2021-07-27 DIAGNOSIS — M47816 Spondylosis without myelopathy or radiculopathy, lumbar region: Secondary | ICD-10-CM

## 2021-07-27 MED ORDER — BUPIVACAINE HCL 0.5 % IJ SOLN
3.0000 mL | Freq: Once | INTRAMUSCULAR | Status: AC
  Administered 2021-07-27: 3 mL

## 2021-07-27 NOTE — Progress Notes (Signed)
Pt state lower back pain that travels to his right thigh. Pt state laying down, standing and walking makes the pain worse. Pt state he uses pain cream and ice to help ease his pain. ? ?Numeric Pain Rating Scale and Functional Assessment ?Average Pain 3 ? ? ?In the last MONTH (on 0-10 scale) has pain interfered with the following? ? ?1. General activity like being  able to carry out your everyday physical activities such as walking, climbing stairs, carrying groceries, or moving a chair?  ?Rating(10) ? ? ?+Driver, -BT, -Dye Allergies. ? ?

## 2021-07-27 NOTE — Patient Instructions (Signed)

## 2021-07-28 NOTE — Progress Notes (Signed)
? ?Scott [name] - 51 y.o. male MRN [id number]  Date of birth: 22-Jul-1970 ? ?Office Visit Note: ?Visit Date: 07/27/2021 ?PCP: Georgina Quint, MD ?Referred by: Georgina Quint, * ? ?Subjective: ?Chief Complaint  ?Patient presents with  ? Lower Back - Pain  ? Right Thigh - Pain  ? ?HPI:  Scott [name] is a 51 y.o. male who comes in today at the request of Ellin Goodie, FNP for planned Bilateral  L4-5 Lumbar facet/medial branch block with fluoroscopic guidance.  The patient has failed conservative care including home exercise, medications, time and activity modification.  This injection will be diagnostic and hopefully therapeutic.  Please see requesting physician notes for further details and justification.  Exam has shown concordant pain with facet joint loading. ? ?ROS Otherwise per HPI. ? ?Assessment & Plan: ?Visit Diagnoses:  ?  ICD-10-CM   ?1. Spondylosis without myelopathy or radiculopathy, lumbar region  M47.816 XR C-ARM NO REPORT  ?  Facet Injection  ?  bupivacaine (MARCAINE) 0.5 % (with pres) injection 3 mL  ?  ?  ?Plan: No additional findings.  ? ?Meds & Orders:  ?Meds ordered this encounter  ?Medications  ? bupivacaine (MARCAINE) 0.5 % (with pres) injection 3 mL  ?  ?Orders Placed This Encounter  ?Procedures  ? Facet Injection  ? XR C-ARM NO REPORT  ?  ?Follow-up: Return for Review Pain Diary.  ? ?Procedures: ?No procedures performed  ?Lumbar Diagnostic Facet Joint Nerve Block with Fluoroscopic Guidance  ? ?[name]      ?Date of Birth: 07-01-1970 ?MRN: [id number] ?PCP: Georgina Quint, MD      ?Visit Date: 07/27/2021 ?  ?Universal Protocol:    ?Date/Time: 03/28/239:24 AM ? ?Consent Given By: the patient ? ?Position: PRONE ? ?Additional Comments: ?Vital signs were monitored before and after the procedure. ?Patient was prepped and draped in the usual sterile fashion. ?The correct patient, procedure, and site was verified. ? ? ?Injection Procedure Details:   ? ?Procedure diagnoses:  ?1. Spondylosis without myelopathy or radiculopathy, lumbar region   ?  ? ?Meds Administered:  ?Meds ordered this encounter  ?Medications  ? bupivacaine (MARCAINE) 0.5 % (with pres) injection 3 mL  ?  ? ?Laterality: Bilateral ? ?Location/Site: L4-L5, L3 and L4 medial branches ? ?Needle: 5.0 in., 25 ga.  Short bevel or Quincke spinal needle ? ?Needle Placement: Oblique pedical ? ?Findings: ? ? -Comments: There was excellent flow of contrast along the articular pillars without intravascular flow. ? ?Procedure Details: ?The fluoroscope beam is vertically oriented in AP and then obliqued 15 to 20 degrees to the ipsilateral side of the desired nerve to achieve the ?Scotty dog? appearance.  The skin over the target area of the junction of the superior articulating process and the transverse process (sacral ala if blocking the L5 dorsal rami) was locally anesthetized with a 1 ml volume of 1% Lidocaine without Epinephrine.  The spinal needle was inserted and advanced in a trajectory view down to the target.  ? ?After contact with periosteum and negative aspirate for blood and CSF, correct placement without intravascular or epidural spread was confirmed by injecting 0.5 ml. of Isovue-250.  A spot radiograph was obtained of this image.   ? ?Next, a 0.5 ml. volume of the injectate described above was injected. The needle was then redirected to the other facet joint nerves mentioned above if needed. ? ?Prior to the procedure, the patient was given a Pain Diary which was completed  for baseline measurements.  After the procedure, the patient rated their pain every 30 minutes and will continue rating at this frequency for a total of 5 hours.  The patient has been asked to complete the Diary and return to us by mail, fax or hand delivered as soon as possible. ? ? ?Additional Comments:  ?The patient tolerated the procedure well ?Dressing: 2 x 2 sterile gauze and Band-Aid ?  ? ?Post-procedure  details: ?Patient was observed during the procedure. ?Post-procedure instructions were reviewed. ? ?Patient left the clinic in stable condition.   ? ?Clinical History: ?MRI LUMBAR SPINE WITHOUT CONTRAST  ?   ?TECHNIQUE:  ?Multiplanar, multisequence MR imaging of the lumbar spine was  ?performed. No intravenous contrast was administered.  ?   ?COMPARISON:  Prior radiograph from 01/16/2020 and MRI from  ?02/05/2017.  ?   ?FINDINGS:  ?Segmentation:  Standard.  ?   ?Alignment:  Physiologic.  No listhesis.  ?   ?Vertebrae: Vertebral body height maintained without acute or chronic  ?fracture. Bone marrow signal intensity within normal limits. No  ?discrete or worrisome osseous lesions. Mild reactive marrow edema  ?noted about the right greater than left L4-5 facets due to facet  ?arthritis. No other abnormal marrow edema.  ?   ?Conus medullaris and cauda equina: Conus extends to the L1 level.  ?Conus and cauda equina appear normal.  ?   ?Paraspinal and other soft tissues: Unremarkable.  ?   ?Disc levels:  ?   ?L1-2:  Unremarkable.  ?   ?L2-3:  Unremarkable.  ?   ?L3-4: Normal interspace. Minimal facet spurring. No canal or  ?foraminal stenosis. No impingement.  ?   ?L4-5: Disc desiccation. Shallow broad-based left subarticular disc  ?protrusion indents the left ventral thecal sac, encroaching upon the  ?left lateral recess. Associated central annular fissure. Moderate  ?bilateral facet hypertrophy, mildly progressed from previous.  ?Resultant mild canal with moderate left lateral recess stenosis,  ?descending L5 nerve root level. Mild to moderate bilateral L4  ?foraminal narrowing. Appearance is overall relatively similar to  ?previous.  ?   ?L5-S1: Normal interspace. Moderate right with mild left facet  ?hypertrophy, mildly progressed from previous. No stenosis or  ?impingement.  ?   ?IMPRESSION:  ?1. Shallow left subarticular disc protrusion at L4-5 with resultant  ?mild canal and moderate left lateral recess stenosis,  potentially  ?affecting the descending left L5 nerve root.  ?2. Moderate bilateral facet hypertrophy at L4-5 and L5-S1, mildly  ?progressed from previous, and could contribute to lower back pain.  ?   ?   ?Electronically Signed  ?  By: Rise MuBenjamin  McClintock M.D.  ?  On: 01/27/2020 01:46  ? ? ? ?Objective:  VS:  HT:    WT:   BMI:     BP:(!) 120/91  HR:93bpm  TEMP: ( )  RESP:  ?Physical Exam ?Vitals and nursing note reviewed.  ?Constitutional:   ?   General: He is not in acute distress. ?   Appearance: Normal appearance. He is not ill-appearing.  ?HENT:  ?   Head: Normocephalic and atraumatic.  ?   Right Ear: External ear normal.  ?   Left Ear: External ear normal.  ?   Nose: No congestion.  ?Eyes:  ?   Extraocular Movements: Extraocular movements intact.  ?Cardiovascular:  ?   Rate and Rhythm: Normal rate.  ?   Pulses: Normal pulses.  ?Pulmonary:  ?   Effort: Pulmonary effort is normal. No respiratory distress.  ?  Abdominal:  ?   General: There is no distension.  ?   Palpations: Abdomen is soft.  ?Musculoskeletal:     ?   General: No tenderness or signs of injury.  ?   Cervical back: Neck supple.  ?   Right lower leg: No edema.  ?   Left lower leg: No edema.  ?   Comments: Patient has good distal strength without clonus. Patient somewhat slow to rise from a seated position to full extension.  There is concordant low back pain with facet loading and lumbar spine extension rotation.  There are no definitive trigger points but the patient is somewhat tender across the lower back and PSIS.  There is no pain with hip rotation.  ?Skin: ?   Findings: No erythema or rash.  ?Neurological:  ?   General: No focal deficit present.  ?   Mental Status: He is alert and oriented to person, place, and time.  ?   Sensory: No sensory deficit.  ?   Motor: No weakness or abnormal muscle tone.  ?   Coordination: Coordination normal.  ?Psychiatric:     ?   Mood and Affect: Mood normal.     ?   Behavior: Behavior normal.  ?   ? ?Imaging: ?XR C-ARM NO REPORT ? ?Result Date: 07/27/2021 ?Please see Notes tab for imaging impression.  ?

## 2021-07-28 NOTE — Procedures (Signed)
Lumbar Diagnostic Facet Joint Nerve Block with Fluoroscopic Guidance  ? ?[name]      ?Date of Birth: 1970-05-21 ?MRN: [id number] ?PCP: Georgina Quint, MD      ?Visit Date: 07/27/2021 ?  ?Universal Protocol:    ?Date/Time: 03/28/239:24 AM ? ?Consent Given By: the patient ? ?Position: PRONE ? ?Additional Comments: ?Vital signs were monitored before and after the procedure. ?Patient was prepped and draped in the usual sterile fashion. ?The correct patient, procedure, and site was verified. ? ? ?Injection Procedure Details:  ? ?Procedure diagnoses:  ?1. Spondylosis without myelopathy or radiculopathy, lumbar region   ?  ? ?Meds Administered:  ?Meds ordered this encounter  ?Medications  ? bupivacaine (MARCAINE) 0.5 % (with pres) injection 3 mL  ?  ? ?Laterality: Bilateral ? ?Location/Site: L4-L5, L3 and L4 medial branches ? ?Needle: 5.0 in., 25 ga.  Short bevel or Quincke spinal needle ? ?Needle Placement: Oblique pedical ? ?Findings: ? ? -Comments: There was excellent flow of contrast along the articular pillars without intravascular flow. ? ?Procedure Details: ?The fluoroscope beam is vertically oriented in AP and then obliqued 15 to 20 degrees to the ipsilateral side of the desired nerve to achieve the ?Scotty dog? appearance.  The skin over the target area of the junction of the superior articulating process and the transverse process (sacral ala if blocking the L5 dorsal rami) was locally anesthetized with a 1 ml volume of 1% Lidocaine without Epinephrine.  The spinal needle was inserted and advanced in a trajectory view down to the target.  ? ?After contact with periosteum and negative aspirate for blood and CSF, correct placement without intravascular or epidural spread was confirmed by injecting 0.5 ml. of Isovue-250.  A spot radiograph was obtained of this image.   ? ?Next, a 0.5 ml. volume of the injectate described above was injected. The needle was then redirected to the other facet  joint nerves mentioned above if needed. ? ?Prior to the procedure, the patient was given a Pain Diary which was completed for baseline measurements.  After the procedure, the patient rated their pain every 30 minutes and will continue rating at this frequency for a total of 5 hours.  The patient has been asked to complete the Diary and return to [place] by mail, fax or hand delivered as soon as possible. ? ? ?Additional Comments:  ?The patient tolerated the procedure well ?Dressing: 2 x 2 sterile gauze and Band-Aid ?  ? ?Post-procedure details: ?Patient was observed during the procedure. ?Post-procedure instructions were reviewed. ? ?Patient left the clinic in stable condition.  ?

## 2021-07-29 ENCOUNTER — Encounter: Payer: Self-pay | Admitting: Emergency Medicine

## 2021-07-29 ENCOUNTER — Ambulatory Visit (INDEPENDENT_AMBULATORY_CARE_PROVIDER_SITE_OTHER): Payer: 59 | Admitting: Emergency Medicine

## 2021-07-29 VITALS — BP 126/84 | HR 81 | Temp 98.3°F | Ht 66.0 in | Wt 230.2 lb

## 2021-07-29 DIAGNOSIS — J3489 Other specified disorders of nose and nasal sinuses: Secondary | ICD-10-CM | POA: Diagnosis not present

## 2021-07-29 DIAGNOSIS — H9203 Otalgia, bilateral: Secondary | ICD-10-CM

## 2021-07-29 DIAGNOSIS — Z794 Long term (current) use of insulin: Secondary | ICD-10-CM

## 2021-07-29 DIAGNOSIS — I10 Essential (primary) hypertension: Secondary | ICD-10-CM

## 2021-07-29 DIAGNOSIS — E1165 Type 2 diabetes mellitus with hyperglycemia: Secondary | ICD-10-CM | POA: Diagnosis not present

## 2021-07-29 MED ORDER — AMOXICILLIN-POT CLAVULANATE 875-125 MG PO TABS: 1.0000 | ORAL_TABLET | Freq: Two times a day (BID) | ORAL | 0 refills | Status: AC

## 2021-07-29 MED ORDER — EMPAGLIFLOZIN 10 MG PO TABS: 10.0000 mg | ORAL_TABLET | Freq: Every day | ORAL | 3 refills | Status: AC

## 2021-07-29 MED ORDER — METFORMIN HCL 500 MG PO TABS: 500.0000 mg | ORAL_TABLET | Freq: Two times a day (BID) | ORAL | 3 refills | Status: DC

## 2021-07-29 MED ORDER — LISINOPRIL 20 MG PO TABS: 20.0000 mg | ORAL_TABLET | Freq: Every day | ORAL | 3 refills | Status: DC

## 2021-07-29 NOTE — Patient Instructions (Signed)
Earache, Adult An earache, or ear pain, can be caused by many things, including: An infection. Ear wax buildup. Ear pressure. Something in the ear that should not be there (foreign body). A sore throat. Tooth problems. Jaw problems. Treatment of the earache will depend on the cause. If the cause is not clear or cannot be determined, you may need to watch your symptoms until your earache goes away or until a cause is found. Follow these instructions at home: Medicines Take or apply over-the-counter and prescription medicines only as told by your health care provider. If you were prescribed an antibiotic medicine, use it as told by your health care provider. Do not stop using the antibiotic even if you start to feel better. Do not put anything in your ear other than medicine that is prescribed by your health care provider. Managing pain If directed, apply heat to the affected area as often as told by your health care provider. Use the heat source that your health care provider recommends, such as a moist heat pack or a heating pad. Place a towel between your skin and the heat source. Leave the heat on for 20-30 minutes. Remove the heat if your skin turns bright red. This is especially important if you are unable to feel pain, heat, or cold. You may have a greater risk of getting burned. If directed, put ice on the affected area as often as told by your health care provider. To do this:   Put ice in a plastic bag. Place a towel between your skin and the bag. Leave the ice on for 20 minutes, 2-3 times a day. General instructions Pay attention to any changes in your symptoms. Try resting in an upright position instead of lying down. This may help to reduce pressure in your ear and relieve pain. Chew gum if it helps to relieve your ear pain. Treat any allergies as told by your health care provider. Drink enough fluid to keep your urine pale yellow. It is up to you to get the results of any  tests that were done. Ask your health care provider, or the department that is doing the tests, when your results will be ready. Keep all follow-up visits as told by your health care provider. This is important. Contact a health care provider if: Your pain does not improve within 2 days. Your earache gets worse. You have new symptoms. You have a fever. Get help right away if you: Have a severe headache. Have a stiff neck. Have trouble swallowing. Have redness or swelling behind your ear. Have fluid or blood coming from your ear. Have hearing loss. Feel dizzy. Summary An earache, or ear pain, can be caused by many things. Treatment of the earache will depend on the cause. Follow recommendations from your health care provider to treat your ear pain. If the cause is not clear or cannot be determined, you may need to watch your symptoms until your earache goes away or until a cause is found. Keep all follow-up visits as told by your health care provider. This is important. This information is not intended to replace advice given to you by your health care provider. Make sure you discuss any questions you have with your health care provider. Document Revised: 11/25/2018 Document Reviewed: 11/25/2018 Elsevier Patient Education  2022 Elsevier Inc.  

## 2021-07-29 NOTE — Progress Notes (Signed)
Scott [name] ?51 y.o. ? ? ?Chief Complaint  ?Patient presents with  ? Ear Pain  ?  Bil ear pain x 2 weeks, sharp pain , progressively getting worse , Pain comes and goes   ? Medication Management  ?  Jardiance dose question needs refill   ? ? ?HISTORY OF PRESENT ILLNESS: ?Acute problem visit today. ?This is a 51 y.o. male complaining of bilateral ear pain that started about 2 weeks ago. ?Has chronic sinus issues and feels they have been acting up lately. ?Has had sinus pressure and congestion. ?No other complaints or medical concerns today. ?Diabetic and hypertensive. ? ?HPI ? ? ?Prior to Admission medications   ?Medication Sig Start Date End Date Taking? Authorizing Provider  ?amLODipine (NORVASC) 10 MG tablet Take 1 tablet (10 mg total) by mouth daily. 03/04/21  Yes Georgina Quint, MD  ?amoxicillin-clavulanate (AUGMENTIN) 875-125 MG tablet Take 1 tablet by mouth 2 (two) times daily for 7 days. 07/29/21 08/05/21 Yes Sai Moura, Eilleen Kempf, MD  ?Dulaglutide (TRULICITY) 1.5 MG/0.5ML SOPN Inject 1.5 mg into the skin once a week. 06/04/21  Yes Jordanny Waddington, Eilleen Kempf, MD  ?empagliflozin (JARDIANCE) 10 MG TABS tablet Take 10 mg by mouth daily.   Yes [provider]  ?fluticasone (FLONASE) 50 MCG/ACT nasal spray Place 2 sprays into both nostrils daily. ?Patient taking differently: Place 2 sprays into both nostrils daily as needed for allergies. 10/08/20  Yes Rushie Chestnut, PA-C  ?glucose blood test strip Use as instructed. Inject into the skin once daily. E11.65 ?Patient taking differently: 1 each by Other route as needed for other (Glucose reading). Use as instructed. Inject into the skin once daily. E11.65 11/05/20  Yes Mayers, Cari S, PA-C  ?loratadine (CLARITIN) 10 MG tablet Take 10 mg by mouth daily as needed for allergies.   Yes [provider]  ?Multiple Vitamin (MULTIVITAMIN WITH MINERALS) TABS tablet Take 1 tablet by mouth daily.   Yes [provider]  ?trolamine salicylate  (BLUE-EMU HEMP) 10 % cream Apply 1 application topically as needed for muscle pain.   Yes [provider]  ?zinc gluconate 50 MG tablet Take 50 mg by mouth daily.   Yes [provider]  ?atorvastatin (LIPITOR) 20 MG tablet Take 1 tablet (20 mg total) by mouth daily. 03/04/21 06/04/21  Georgina Quint, MD  ?lisinopril (ZESTRIL) 20 MG tablet Take 1 tablet (20 mg total) by mouth daily. 07/29/21 10/27/21  Georgina Quint, MD  ?metFORMIN (GLUCOPHAGE) 500 MG tablet Take 1 tablet (500 mg total) by mouth 2 (two) times daily with a meal. 07/29/21 10/27/21  Georgina Quint, MD  ? ? ?Allergies  ?Allergen Reactions  ? Pollen Extract Other (See Comments)  ? ? ?Patient Active Problem List  ? Diagnosis Date Noted  ? GERD (gastroesophageal reflux disease) 03/31/2021  ? Acute medial meniscal tear, left, subsequent encounter   ? History of colonic polyps 11/21/2020  ? Posterior calcaneal exostosis 07/21/2020  ? Trigger thumb of right hand 07/03/2020  ? Hyperlipidemia 04/23/2020  ? Class 2 severe obesity due to excess calories with serious comorbidity and body mass index (BMI) of 37.0 to 37.9 in adult [facility]) 02/13/2019  ? Sleep apnea 01/23/2019  ? Arthritis 01/23/2019  ? Chronic diastolic heart failure (HCC) 11/07/2016  ? OSA (obstructive sleep apnea) 04/18/2012  ? Left ventricular diastolic dysfunction, NYHA class 1/moderate LVH with concentric hypertrophy 09/03/2011  ? ? DVT of popliteal vein, right (non occlusive) 09/03/2011  ? Pulmonary embolism (HCC) 09/02/2011  ?  Dyslipidemia associated with type 2 diabetes mellitus (HCC) 09/02/2011  ? Hypertension associated with diabetes (HCC) 09/02/2011  ? ? ?Past Medical History:  ?Diagnosis Date  ? Arthritis   ? Carpal tunnel syndrome   ? Chronic migraine 01/23/2019  ? Diabetes mellitus   ? Diastolic heart failure (HCC)   ? GERD (gastroesophageal reflux disease)   ? Hypertension   ? Obesity   ? Pulmonary embolism (HCC) 2013  ? He has a history of HTN.  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 A repeat scan done in 2017 did not demonstrate evidence of emboli. Neither study demonstrated any coronary calcifications.   ? Shoulder pain, bilateral   ? Sleep apnea   ? was fitted for mask and never got machine - too costly  ? ? ?Past Surgical History:  ?Procedure Laterality Date  ? CARPAL TUNNEL RELEASE Right 12/04/2015  ? Procedure: RIGHT CARPAL TUNNEL RELEASE;  Surgeon: Betha LoaKevin Kuzma, MD;  Location: [facility];  Service: Orthopedics;  Laterality: Right;  RIGHT CARPAL TUNNEL RELEASE  ? CARPAL TUNNEL RELEASE Right 12/2015  ? [facility]  ? CARPAL TUNNEL RELEASE Left 04/01/2016  ? Procedure: LEFT CARPAL TUNNEL RELEASE;  Surgeon: Betha LoaKevin Kuzma, MD;  Location: [facility];  Service: Orthopedics;  Laterality: Left;  ? I & D EXTREMITY Left 02/19/2021  ? Procedure: LEFT FOOT SPUR REMOVAL;  Surgeon: Cammy Copaean, Gregory Scott, MD;  [facility];  Service: Orthopedics;  Laterality: Left;  ? KNEE ARTHROSCOPY WITH MEDIAL MENISECTOMY Left 02/19/2021  ? Procedure: LEFT KNEE ARTHROSCOPY, MENISCAL CYST DECOMPRESSION;  Surgeon: Cammy Copaean, Gregory Scott, MD;  [facility];  Service: Orthopedics;  Laterality: Left;  ? right hand surgery    ? ROTATOR CUFF REPAIR Right   ? ? ?Social History  ? ?Socioeconomic History  ? Marital status: Married  ?  Spouse name: Not on file  ? Number of children: Not on file  ? Years of education: 12+  ? Highest education level: Not on file  ?Occupational History  ? Occupation: real estate agent  ?Tobacco Use  ? Smoking status: Never  ? Smokeless tobacco: Never  ?Vaping Use  ? Vaping Use: Never used  ?Substance and Sexual Activity  ? Alcohol use: No  ?  Alcohol/week: 0.0 standard drinks  ? Drug use: No  ? Sexual activity: Yes  ?Other Topics Concern  ? Not on file  ?Social History Narrative  ? Lives at home.  ? Right-handed.  ? Occasional caffeine use.  ? Tea every other day  ? ?Social Determinants of Health  ? ?Financial  Resource Strain: Not on file  ?Food Insecurity: Not on file  ?Transportation Needs: Not on file  ?Physical Activity: Not on file  ?Stress: Not on file  ?Social Connections: Not on file  ?Intimate Partner Violence: Not on file  ? ? ?Family History  ?Problem Relation Age of Onset  ? Hypertension Mother   ? Kidney disease Father   ? Hypertension Other   ? Hyperlipidemia Other   ? Diabetes Other   ? ? ? ?Review of Systems  ?Constitutional: Negative.  Negative for chills and fever.  ?HENT:  Positive for congestion, ear pain and sinus pain.   ?Respiratory: Negative.  Negative for cough and shortness of breath.   ?Cardiovascular:  Negative for chest pain and palpitations.  ?Gastrointestinal:  Negative for abdominal pain, diarrhea, nausea and vomiting.  ?Genitourinary: Negative.   ?Skin: Negative.  Negative for rash.  ?Neurological:  Positive for headaches. Negative for dizziness.  ?All other systems reviewed and are negative. ? ?Today's Vitals  ? 07/29/21 1114  ?BP: 126/84  ?Pulse: 81  ?Temp: 98.3 ?F (36.8 ?C)  ?SpO2: 95%  ?Weight: 230 lb 4 oz (104.4 kg)  ?Height: 5\' 6"  (1.676 m)  ? ?Body mass index is 37.16 kg/m?. ? ?Physical Exam ?Vitals reviewed.  ?Constitutional:   ?   Appearance: Normal appearance.  ?HENT:  ?   Head: Normocephalic.  ?   Right Ear: Tympanic membrane, ear canal and external ear normal.  ?   Left Ear: Tympanic membrane, ear canal and external ear normal.  ?   Nose: Congestion present.  ?   Mouth/Throat:  ?   Mouth: Mucous membranes are moist.  ?   Pharynx: Oropharynx is clear.  ?Eyes:  ?   Extraocular Movements: Extraocular movements intact.  ?   Pupils: Pupils are equal, round, and reactive to light.  ?Cardiovascular:  ?   Rate and Rhythm: Normal rate and regular rhythm.  ?   Pulses: Normal pulses.  ?   Heart sounds: Normal heart sounds.  ?Pulmonary:  ?   Effort: Pulmonary effort is normal.  ?   Breath sounds: Normal breath sounds.  ?Musculoskeletal:     ?   General: Normal range of motion.  ?    Cervical back: No tenderness.  ?Lymphadenopathy:  ?   Cervical: No cervical adenopathy.  ?Skin: ?   General: Skin is warm and dry.  ?Neurological:  ?   General: No focal deficit present.  ?   Mental Status: He is alert

## 2021-08-03 ENCOUNTER — Telehealth: Payer: Self-pay | Admitting: Physical Medicine and Rehabilitation

## 2021-08-03 NOTE — Telephone Encounter (Signed)
Patient called asked if you received the fax the he faxed over on 07/29/2021. Patient said he faxed a pain level sheet.  The number to contact patient is  [phone]   ?

## 2021-08-12 ENCOUNTER — Telehealth: Payer: Self-pay | Admitting: Physical Medicine and Rehabilitation

## 2021-08-12 ENCOUNTER — Other Ambulatory Visit: Payer: Self-pay | Admitting: Physical Medicine and Rehabilitation

## 2021-08-12 ENCOUNTER — Telehealth: Payer: Self-pay | Admitting: Orthopedic Surgery

## 2021-08-12 DIAGNOSIS — M47816 Spondylosis without myelopathy or radiculopathy, lumbar region: Secondary | ICD-10-CM

## 2021-08-12 NOTE — Telephone Encounter (Signed)
Patient called needing to know if the fax was received? Patient faxed over a pain level sheet on 07/31/2021. The number to contact patient is [phone] ?

## 2021-08-12 NOTE — Telephone Encounter (Signed)
Patient called asked if the gel injection has been approved? The number to contact patient is [phone] ?

## 2021-08-12 NOTE — Progress Notes (Signed)
Spoke with patient regarding recent bilateral L4-L5 facet joint/medial branch blocks, did get greater than 80% pain relief with procedure.  ? ?Patient bilateral L4-L5 facet joint injections on 12/04/2020 at [facility], he also reports greater than 80% pain relief with this procedure.  ? ?We will proceed with approval for radiofrequency ablation procedure as patient has had 2 sets of diagnostic bilateral L4-L5 facet joint injections.  ? ?Patient is aware of plan and agreeable. He has no questions at this time.  ?

## 2021-08-12 NOTE — Telephone Encounter (Signed)
BV pending for Durolane, left knee. ? ?

## 2021-08-12 NOTE — Telephone Encounter (Signed)
Talked with patient concerning gel injection.  

## 2021-08-14 ENCOUNTER — Telehealth: Payer: Self-pay

## 2021-08-14 NOTE — Telephone Encounter (Signed)
Approved for Durolane, left knee. ?Buy & Bill ?Patient will be responsible for 20% OOP. ?No Co-pay ?No PA required ? ?Appt. 09/07/2021 with Dr. August Saucer. ?

## 2021-08-17 ENCOUNTER — Other Ambulatory Visit: Payer: Self-pay

## 2021-08-17 DIAGNOSIS — G8929 Other chronic pain: Secondary | ICD-10-CM

## 2021-09-07 ENCOUNTER — Ambulatory Visit (INDEPENDENT_AMBULATORY_CARE_PROVIDER_SITE_OTHER): Payer: 59 | Admitting: Surgical

## 2021-09-07 ENCOUNTER — Encounter: Payer: Self-pay | Admitting: Orthopedic Surgery

## 2021-09-07 DIAGNOSIS — M1712 Unilateral primary osteoarthritis, left knee: Secondary | ICD-10-CM | POA: Diagnosis not present

## 2021-09-07 MED ORDER — LIDOCAINE HCL 1 % IJ SOLN
5.0000 mL | INTRAMUSCULAR | Status: AC | PRN
  Administered 2021-09-07: 5 mL

## 2021-09-07 MED ORDER — SODIUM HYALURONATE 60 MG/3ML IX PRSY
60.0000 mg | PREFILLED_SYRINGE | INTRA_ARTICULAR | Status: AC | PRN
  Administered 2021-09-07: 60 mg via INTRA_ARTICULAR

## 2021-09-07 NOTE — Progress Notes (Signed)
? ?  Procedure Note ? ?[name]             ?Date of Birth: 1970/05/28           ?MRN: [id number]             ?Visit Date: 09/07/2021 ? ?Procedures: ?Visit Diagnoses:  ?1. Unilateral primary osteoarthritis, left knee   ? ? ?Large Joint Inj: L knee on 09/07/2021 2:55 PM ?Indications: diagnostic evaluation, joint swelling and pain ?Details: 18 G 1.5 in needle, superolateral approach ? ?Arthrogram: No ? ?Medications: 5 mL lidocaine 1 %; 60 mg Sodium Hyaluronate 60 MG/3ML ?Outcome: tolerated well, no immediate complications ? ?No effusion today ?Procedure, treatment alternatives, risks and benefits explained, specific risks discussed. Consent was given by the patient. Immediately prior to procedure a time out was called to verify the correct patient, procedure, equipment, support staff and site/side marked as required. Patient was prepped and draped in the usual sterile fashion.  ? ? ? ? ? ?

## 2021-09-08 ENCOUNTER — Ambulatory Visit (INDEPENDENT_AMBULATORY_CARE_PROVIDER_SITE_OTHER): Payer: 59 | Admitting: Emergency Medicine

## 2021-09-08 ENCOUNTER — Encounter: Payer: Self-pay | Admitting: Emergency Medicine

## 2021-09-08 VITALS — BP 124/84 | HR 97 | Temp 98.6°F | Ht 66.0 in | Wt 231.0 lb

## 2021-09-08 DIAGNOSIS — E1159 Type 2 diabetes mellitus with other circulatory complications: Secondary | ICD-10-CM

## 2021-09-08 DIAGNOSIS — I152 Hypertension secondary to endocrine disorders: Secondary | ICD-10-CM

## 2021-09-08 DIAGNOSIS — Z125 Encounter for screening for malignant neoplasm of prostate: Secondary | ICD-10-CM

## 2021-09-08 DIAGNOSIS — E785 Hyperlipidemia, unspecified: Secondary | ICD-10-CM

## 2021-09-08 DIAGNOSIS — E1169 Type 2 diabetes mellitus with other specified complication: Secondary | ICD-10-CM

## 2021-09-08 LAB — POCT GLYCOSYLATED HEMOGLOBIN (HGB A1C): Hemoglobin A1C: 7 % — AB (ref 4.0–5.6)

## 2021-09-08 LAB — MICROALBUMIN / CREATININE URINE RATIO
Creatinine,U: 115.6 mg/dL
Microalb Creat Ratio: 2.5 mg/g (ref 0.0–30.0)
Microalb, Ur: 2.8 mg/dL — ABNORMAL HIGH (ref 0.0–1.9)

## 2021-09-08 LAB — COMPREHENSIVE METABOLIC PANEL
ALT: 43 U/L (ref 0–53)
AST: 22 U/L (ref 0–37)
Albumin: 4.5 g/dL (ref 3.5–5.2)
Alkaline Phosphatase: 44 U/L (ref 39–117)
BUN: 17 mg/dL (ref 6–23)
CO2: 24 mEq/L (ref 19–32)
Calcium: 9.1 mg/dL (ref 8.4–10.5)
Chloride: 105 mEq/L (ref 96–112)
Creatinine, Ser: 1.13 mg/dL (ref 0.40–1.50)
GFR: 74.99 mL/min (ref >60.00)
Glucose, Bld: 121 mg/dL — ABNORMAL HIGH (ref 70–99)
Potassium: 4 mEq/L (ref 3.5–5.1)
Sodium: 139 mEq/L (ref 135–145)
Total Bilirubin: 0.8 mg/dL (ref 0.2–1.2)
Total Protein: 7 g/dL (ref 6.0–8.3)

## 2021-09-08 LAB — CBC WITH DIFFERENTIAL/PLATELET
Basophils Absolute: 0 10*3/uL (ref 0.0–0.1)
Basophils Relative: 0.3 % (ref 0.0–3.0)
Eosinophils Absolute: 0.1 10*3/uL (ref 0.0–0.7)
Eosinophils Relative: 1.1 % (ref 0.0–5.0)
HCT: 47.3 % (ref 39.0–52.0)
Hemoglobin: 15.3 g/dL (ref 13.0–17.0)
Lymphocytes Relative: 33.4 % (ref 12.0–46.0)
Lymphs Abs: 2.2 10*3/uL (ref 0.7–4.0)
MCHC: 32.5 g/dL (ref 30.0–36.0)
MCV: 77.3 fl — ABNORMAL LOW (ref 78.0–100.0)
Monocytes Absolute: 0.6 10*3/uL (ref 0.1–1.0)
Monocytes Relative: 9.5 % (ref 3.0–12.0)
Neutro Abs: 3.6 10*3/uL (ref 1.4–7.7)
Neutrophils Relative %: 55.7 % (ref 43.0–77.0)
Platelets: 243 10*3/uL (ref 150.0–400.0)
RBC: 6.11 Mil/uL — ABNORMAL HIGH (ref 4.22–5.81)
RDW: 15.1 % (ref 11.5–15.5)
WBC: 6.5 10*3/uL (ref 4.0–10.5)

## 2021-09-08 LAB — PSA: PSA: 0.33 ng/mL (ref 0.10–4.00)

## 2021-09-08 LAB — LIPID PANEL
Cholesterol: 118 mg/dL (ref 0–200)
HDL: 34.7 mg/dL — ABNORMAL LOW (ref >39.00)
LDL Cholesterol: 61 mg/dL (ref 0–99)
NonHDL: 83.34
Total CHOL/HDL Ratio: 3
Triglycerides: 110 mg/dL (ref 0.0–149.0)
VLDL: 22 mg/dL (ref 0.0–40.0)

## 2021-09-08 MED ORDER — ATORVASTATIN CALCIUM 20 MG PO TABS: 20.0000 mg | ORAL_TABLET | Freq: Every day | ORAL | 3 refills | Status: DC

## 2021-09-08 NOTE — Assessment & Plan Note (Signed)
Well-controlled hypertension.  Continue lisinopril 20 mg daily and amlodipine 10 mg daily. ?BP Readings from Last 3 Encounters:  ?09/08/21 124/84  ?07/29/21 126/84  ?07/27/21 (!) 120/91  ?Well-controlled diabetes with hemoglobin A1c at 7.0 ?Continue daily Jardiance 10 mg and weekly Trulicity 1.5 mg ?Diet and nutrition discussed. ?Cardiovascular risks associated with hypertension and diabetes discussed. ?Follow-up in 6 months ? ?

## 2021-09-08 NOTE — Patient Instructions (Signed)

## 2021-09-08 NOTE — Progress Notes (Signed)
Jonetta Speak ?51 y.o. ? ? ?Chief Complaint  ?Patient presents with  ? Follow-up  ?  No concerns  ? ? ?HISTORY OF PRESENT ILLNESS: ?This is a 51 y.o. male here for follow-up on hypertension and diabetes. ?#1 hypertension: On lisinopril 20 mg daily ?#2 diabetes: On Trulicity 1.5 mg weekly and Jardiance 10 mg daily and metformin 500 mg twice a day ?Doing well.  Has no complaints or medical concerns today. ? ?HPI ? ? ?Prior to Admission medications   ?Medication Sig Start Date End Date Taking? Authorizing Provider  ?amLODipine (NORVASC) 10 MG tablet Take 1 tablet (10 mg total) by mouth daily. 03/04/21  Yes Rogerio Boutelle, Ines Bloomer, MD  ?Dulaglutide (TRULICITY) 1.5 0000000 SOPN Inject 1.5 mg into the skin once a week. 06/04/21  Yes Maddix Kliewer, Ines Bloomer, MD  ?empagliflozin (JARDIANCE) 10 MG TABS tablet Take 1 tablet (10 mg total) by mouth daily. 07/29/21 10/27/21 Yes Thaily Hackworth, Ines Bloomer, MD  ?fluticasone [facility]) 50 MCG/ACT nasal spray Place 2 sprays into both nostrils daily. ?Patient taking differently: Place 2 sprays into both nostrils daily as needed for allergies. 10/08/20  Yes Hughie Closs, PA-C  ?glucose blood test strip Use as instructed. Inject into the skin once daily. E11.65 ?Patient taking differently: 1 each by Other route as needed for other (Glucose reading). Use as instructed. Inject into the skin once daily. E11.65 11/05/20  Yes Mayers, Cari S, PA-C  ?lisinopril (ZESTRIL) 20 MG tablet Take 1 tablet (20 mg total) by mouth daily. 07/29/21 10/27/21 Yes Deryn Massengale, Ines Bloomer, MD  ?loratadine (CLARITIN) 10 MG tablet Take 10 mg by mouth daily as needed for allergies.   Yes [provider]  ?metFORMIN (GLUCOPHAGE) 500 MG tablet Take 1 tablet (500 mg total) by mouth 2 (two) times daily with a meal. 07/29/21 10/27/21 Yes Granger Chui, Ines Bloomer, MD  ?Multiple Vitamin (MULTIVITAMIN WITH MINERALS) TABS tablet Take 1 tablet by mouth daily.   Yes [provider]  ?trolamine salicylate (BLUE-EMU HEMP)  10 % cream Apply 1 application topically as needed for muscle pain.   Yes [provider]  ?zinc gluconate 50 MG tablet Take 50 mg by mouth daily.   Yes [provider]  ?atorvastatin (LIPITOR) 20 MG tablet Take 1 tablet (20 mg total) by mouth daily. 09/08/21 09/03/22  Horald Pollen, MD  ? ? ?Allergies  ?Allergen Reactions  ? Pollen Extract Other (See Comments)  ? ? ?Patient Active Problem List  ? Diagnosis Date Noted  ? GERD (gastroesophageal reflux disease) 03/31/2021  ? Acute medial meniscal tear, left, subsequent encounter   ? History of colonic polyps 11/21/2020  ? Posterior calcaneal exostosis 07/21/2020  ? Trigger thumb of right hand 07/03/2020  ? Hyperlipidemia 04/23/2020  ? Class 2 severe obesity due to excess calories with serious comorbidity and body mass index (BMI) of 37.0 to 37.9 in adult [facility]) 02/13/2019  ? Sleep apnea 01/23/2019  ? Arthritis 01/23/2019  ? Chronic diastolic heart failure ([place]) 11/07/2016  ? OSA (obstructive sleep apnea) 04/18/2012  ? Left ventricular diastolic dysfunction, NYHA class 1/moderate LVH with concentric hypertrophy 09/03/2011  ? ? DVT of popliteal vein, right (non occlusive) 09/03/2011  ? Pulmonary embolism ([place]) 09/02/2011  ? Dyslipidemia associated with type 2 diabetes mellitus ([place]) 09/02/2011  ? Hypertension associated with diabetes ([place]) 09/02/2011  ? ? ?Past Medical History:  ?Diagnosis Date  ? Arthritis   ? Carpal tunnel syndrome   ? Chronic migraine 01/23/2019  ? Diabetes mellitus   ? Diastolic  heart failure ([place])   ? GERD (gastroesophageal reflux disease)   ? Hypertension   ? Obesity   ? Pulmonary embolism ([place]) 2013  ? He has a history of HTN.  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 A repeat scan done in 2017 did not demonstrate evidence of emboli. Neither study demonstrated any coronary calcifications.   ? Shoulder pain, bilateral   ? Sleep apnea   ? was fitted for mask and never got machine - too costly   ? ? ?Past Surgical History:  ?Procedure Laterality Date  ? CARPAL TUNNEL RELEASE Right 12/04/2015  ? Procedure: RIGHT CARPAL TUNNEL RELEASE;  Surgeon: Leanora Cover, MD;  Location: [place];  Service: Orthopedics;  Laterality: Right;  RIGHT CARPAL TUNNEL RELEASE  ? CARPAL TUNNEL RELEASE Right 12/2015  ? [place]  ? CARPAL TUNNEL RELEASE Left 04/01/2016  ? Procedure: LEFT CARPAL TUNNEL RELEASE;  Surgeon: Leanora Cover, MD;  Location: [place];  Service: Orthopedics;  Laterality: Left;  ? I & D EXTREMITY Left 02/19/2021  ? Procedure: LEFT FOOT SPUR REMOVAL;  Surgeon: Meredith Pel, MD;  Location: [place];  Service: Orthopedics;  Laterality: Left;  ? KNEE ARTHROSCOPY WITH MEDIAL MENISECTOMY Left 02/19/2021  ? Procedure: LEFT KNEE ARTHROSCOPY, MENISCAL CYST DECOMPRESSION;  Surgeon: Meredith Pel, MD;  Location: [place];  Service: Orthopedics;  Laterality: Left;  ? right hand surgery    ? ROTATOR CUFF REPAIR Right   ? ? ?Social History  ? ?Socioeconomic History  ? Marital status: Married  ?  Spouse name: Not on file  ? Number of children: Not on file  ? Years of education: 12+  ? Highest education level: Not on file  ?Occupational History  ? Occupation: real estate agent  ?Tobacco Use  ? Smoking status: Never  ? Smokeless tobacco: Never  ?Vaping Use  ? Vaping Use: Never used  ?Substance and Sexual Activity  ? Alcohol use: No  ?  Alcohol/week: 0.0 standard drinks  ? Drug use: No  ? Sexual activity: Yes  ?Other Topics Concern  ? Not on file  ?Social History Narrative  ? Lives at home.  ? Right-handed.  ? Occasional caffeine use.  ? Tea every other day  ? ?Social Determinants of Health  ? ?Financial Resource Strain: Not on file  ?Food Insecurity: Not on file  ?Transportation Needs: Not on file  ?Physical Activity: Not on file  ?Stress: Not on file  ?Social Connections: Not on file  ?Intimate Partner Violence: Not on file  ? ? ?Family History  ?Problem Relation Age of Onset  ? Hypertension  Mother   ? Kidney disease Father   ? Hypertension Other   ? Hyperlipidemia Other   ? Diabetes Other   ? ? ? ?Review of Systems  ?Constitutional: Negative.  Negative for chills and fever.  ?HENT: Negative.    ?Respiratory: Negative.    ?Cardiovascular: Negative.   ?Gastrointestinal:  Negative for abdominal pain, nausea and vomiting.  ?Genitourinary: Negative.   ?Musculoskeletal: Negative.   ?Skin: Negative.  Negative for rash.  ?Neurological: Negative.  Negative for dizziness and headaches.  ?All other systems reviewed and are negative. ? ?Today's Vitals  ? 09/08/21 1449  ?BP: 124/84  ?Pulse: 97  ?Temp: 98.6 ?F (37 ?C)  ?SpO2: 92%  ?Weight: 231 lb (104.8 kg)  ?Height: 5\' 6"  (1.676 m)  ? ?Body mass index is 37.28 kg/m?. ?Wt Readings from Last 3 Encounters:  ?09/08/21  231 lb (104.8 kg)  ?07/29/21 230 lb 4 oz (104.4 kg)  ?06/04/21 237 lb (107.5 kg)  ? ? ?Physical Exam ?Vitals reviewed.  ?Constitutional:   ?   Appearance: Normal appearance.  ?HENT:  ?   Head: Normocephalic.  ?Eyes:  ?   Extraocular Movements: Extraocular movements intact.  ?   Conjunctiva/sclera: Conjunctivae normal.  ?   Pupils: Pupils are equal, round, and reactive to light.  ?Cardiovascular:  ?   Rate and Rhythm: Normal rate and regular rhythm.  ?   Pulses: Normal pulses.  ?   Heart sounds: Normal heart sounds.  ?Pulmonary:  ?   Effort: Pulmonary effort is normal.  ?   Breath sounds: Normal breath sounds.  ?Abdominal:  ?   General: There is no distension.  ?   Palpations: Abdomen is soft.  ?   Tenderness: There is no abdominal tenderness.  ?Musculoskeletal:  ?   Cervical back: No tenderness.  ?Lymphadenopathy:  ?   Cervical: No cervical adenopathy.  ?Skin: ?   General: Skin is warm and dry.  ?   Capillary Refill: Capillary refill takes less than 2 seconds.  ?Neurological:  ?   General: No focal deficit present.  ?   Mental Status: He is alert and oriented to person, place, and time.  ?Psychiatric:     ?   Mood and Affect: Mood normal.     ?    Behavior: Behavior normal.  ? ?Results for orders placed or performed in visit on 09/08/21 (from the past 24 hour(s))  ?POCT HgB A1C     Status: Abnormal  ? Collection Time: 09/08/21  3:12 PM  ?Result Value Ref Ra

## 2021-09-08 NOTE — Assessment & Plan Note (Signed)
Stable.  Diet and nutrition discussed.  Continue atorvastatin 20 mg daily. ?The 10-year ASCVD risk score (Arnett DK, et al., 2019) is: 26.2% ?  Values used to calculate the score: ?    Age: 51 years ?    Sex: Male ?    Is Non-Hispanic African American: Yes ?    Diabetic: Yes ?    Tobacco smoker: Yes ?    Systolic Blood Pressure: A999333 mmHg ?    Is BP treated: Yes ?    HDL Cholesterol: 40.2 mg/dL ?    Total Cholesterol: 144 mg/dL ? ?

## 2021-09-16 ENCOUNTER — Ambulatory Visit (INDEPENDENT_AMBULATORY_CARE_PROVIDER_SITE_OTHER): Payer: 59 | Admitting: Physical Medicine and Rehabilitation

## 2021-09-16 ENCOUNTER — Encounter: Payer: Self-pay | Admitting: Physical Medicine and Rehabilitation

## 2021-09-16 ENCOUNTER — Ambulatory Visit: Payer: Self-pay

## 2021-09-16 VITALS — BP 134/90 | HR 98

## 2021-09-16 DIAGNOSIS — M47816 Spondylosis without myelopathy or radiculopathy, lumbar region: Secondary | ICD-10-CM | POA: Diagnosis not present

## 2021-09-16 MED ORDER — METHYLPREDNISOLONE ACETATE 80 MG/ML IJ SUSP
80.0000 mg | Freq: Once | INTRAMUSCULAR | Status: AC
  Administered 2021-09-16: 80 mg

## 2021-09-16 NOTE — Patient Instructions (Signed)

## 2021-09-16 NOTE — Progress Notes (Signed)
Pt state lower back pain. Pt state laying down, standing and walking makes the pain worse. Pt state he uses pain cream and ice to help ease his pain.  Numeric Pain Rating Scale and Functional Assessment Average Pain 8   In the last MONTH (on 0-10 scale) has pain interfered with the following?  1. General activity like being  able to carry out your everyday physical activities such as walking, climbing stairs, carrying groceries, or moving a chair?  Rating(7)   +Driver, -BT, -Dye Allergies.

## 2021-09-23 ENCOUNTER — Encounter: Payer: Self-pay | Admitting: Physical Medicine and Rehabilitation

## 2021-09-23 ENCOUNTER — Ambulatory Visit (INDEPENDENT_AMBULATORY_CARE_PROVIDER_SITE_OTHER): Payer: 59 | Admitting: Physical Medicine and Rehabilitation

## 2021-09-23 ENCOUNTER — Ambulatory Visit: Payer: Self-pay

## 2021-09-23 VITALS — BP 126/83 | HR 77

## 2021-09-23 DIAGNOSIS — M47816 Spondylosis without myelopathy or radiculopathy, lumbar region: Secondary | ICD-10-CM

## 2021-09-23 MED ORDER — METHYLPREDNISOLONE ACETATE 80 MG/ML IJ SUSP
80.0000 mg | Freq: Once | INTRAMUSCULAR | Status: AC
  Administered 2021-09-23: 80 mg

## 2021-09-23 NOTE — Patient Instructions (Signed)

## 2021-09-23 NOTE — Progress Notes (Signed)
Pt state lower back pain. Pt state laying down, standing and walking makes the pain worse. Pt state he uses pain cream and ice to help ease his pain.  Numeric Pain Rating Scale and Functional Assessment Average Pain 5   In the last MONTH (on 0-10 scale) has pain interfered with the following?  1. General activity like being  able to carry out your everyday physical activities such as walking, climbing stairs, carrying groceries, or moving a chair?  Rating(8)   +Driver, -BT, -Dye Allergies.

## 2021-09-30 NOTE — Procedures (Signed)
Lumbar Facet Joint Nerve Denervation  [name]      Date of Birth: 09-Dec-1970 MRN: [id number] PCP: Georgina Quint, MD      Visit Date: 09/16/2021   Universal Protocol:    Date/Time: 05/31/235:54 AM  Consent Given By: the patient  Position: PRONE  Additional Comments: Vital signs were monitored before and after the procedure. Patient was prepped and draped in the usual sterile fashion. The correct patient, procedure, and site was verified.   Injection Procedure Details:   Procedure diagnoses:  1. Spondylosis without myelopathy or radiculopathy, lumbar region      Meds Administered:  Meds ordered this encounter  Medications   methylPREDNISolone acetate (DEPO-MEDROL) injection 80 mg     Laterality: Right  Location/Site:  L4-L5, L3 and L4 medial branches  Needle: 18 ga.,  71mm active tip, RF Cannula  Needle Placement: Along juncture of superior articular process and transverse pocess  Findings:  -Comments:  Procedure Details: For each desired target nerve, the corresponding transverse process (sacral ala for the L5 dorsal rami) was identified and the fluoroscope was positioned to square off the endplates of the corresponding vertebral body to achieve a true AP midline view.  The beam was then obliqued 15 to 20 degrees and caudally tilted 15 to 20 degrees to line up a trajectory along the target nerves. The skin over the target of the junction of superior articulating process and transverse process (sacral ala for the L5 dorsal rami) was infiltrated with 35ml of 1% Lidocaine without Epinephrine.  The 18 gauge 32mm active tip outer cannula was advanced in trajectory view to the target.  This procedure was repeated for each target nerve.  Then, for all levels, the outer cannula placement was fine-tuned and the position was then confirmed with bi-planar imaging.    Test stimulation was done both at sensory and motor levels to ensure there was no  radicular stimulation. The target tissues were then infiltrated with 1 ml of 1% Lidocaine without Epinephrine. Subsequently, a percutaneous neurotomy was carried out for 90 seconds at 80 degrees Celsius.  After the completion of the lesion, 1 ml of injectate was delivered. It was then repeated for each facet joint nerve mentioned above. Appropriate radiographs were obtained to verify the probe placement during the neurotomy.   Additional Comments:  No complications occurred Dressing: 2 x 2 sterile gauze and Band-Aid    Post-procedure details: Patient was observed during the procedure. Post-procedure instructions were reviewed.  Patient left the clinic in stable condition.

## 2021-09-30 NOTE — Progress Notes (Unsigned)
    Scott [name] D.[facility] Phone: [phone]   Assessment and Plan:     There are no diagnoses linked to this encounter.  ***   Pertinent previous records reviewed include ***   Follow Up: ***     Subjective:   I, Scott [name], am serving as a Education administrator for Doctor Glennon Mac  Chief Complaint: left wrist and hip  HPI:  10/01/2021 Patient is a 51 year old male complaining of left wrist and hip pain. Patient states  Relevant Historical Information: ***  Additional pertinent review of systems negative.   Current Outpatient Medications:    amLODipine (NORVASC) 10 MG tablet, Take 1 tablet (10 mg total) by mouth daily., Disp: 90 tablet, Rfl: 3   atorvastatin (LIPITOR) 20 MG tablet, Take 1 tablet (20 mg total) by mouth daily., Disp: 90 tablet, Rfl: 3   Dulaglutide (TRULICITY) 1.5 0000000 SOPN, Inject 1.5 mg into the skin once a week., Disp: 2 mL, Rfl: 7   empagliflozin (JARDIANCE) 10 MG TABS tablet, Take 1 tablet (10 mg total) by mouth daily., Disp: 90 tablet, Rfl: 3   fluticasone (FLONASE) 50 MCG/ACT nasal spray, Place 2 sprays into both nostrils daily. (Patient taking differently: Place 2 sprays into both nostrils daily as needed for allergies.), Disp: 16 mL, Rfl: 0   glucose blood test strip, Use as instructed. Inject into the skin once daily. E11.65 (Patient taking differently: 1 each by Other route as needed for other (Glucose reading). Use as instructed. Inject into the skin once daily. E11.65), Disp: 200 each, Rfl: 12   lisinopril (ZESTRIL) 20 MG tablet, Take 1 tablet (20 mg total) by mouth daily., Disp: 90 tablet, Rfl: 3   loratadine (CLARITIN) 10 MG tablet, Take 10 mg by mouth daily as needed for allergies., Disp: , Rfl:    metFORMIN (GLUCOPHAGE) 500 MG tablet, Take 1 tablet (500 mg total) by mouth 2 (two) times daily with a meal., Disp: 180 tablet, Rfl: 3   Multiple Vitamin (MULTIVITAMIN WITH  MINERALS) TABS tablet, Take 1 tablet by mouth daily., Disp: , Rfl:    trolamine salicylate (BLUE-EMU HEMP) 10 % cream, Apply 1 application topically as needed for muscle pain., Disp: , Rfl:    zinc gluconate 50 MG tablet, Take 50 mg by mouth daily., Disp: , Rfl:    Objective:     There were no vitals filed for this visit.    There is no height or weight on file to calculate BMI.    Physical Exam:    ***   Electronically signed by:  Scott [name] D.[facility] 11:34 AM 09/30/21

## 2021-09-30 NOTE — Progress Notes (Signed)
Scott [name] - 51 y.o. male MRN [id number]  Date of birth: 1971/02/19  Office Visit Note: Visit Date: 09/16/2021 PCP: Georgina Quint, MD Referred by: Georgina Quint, *  Subjective: Chief Complaint  Patient presents with   Lower Back - Pain   HPI:  Scott [name] is a 51 y.o. male who comes in todayfor planned radiofrequency ablation of the Right L4-5 Lumbar facet joints. This would be ablation of the corresponding medial branches and/or dorsal rami.  Patient has had double diagnostic blocks with more than 50% relief.  These are documented on pain diary.  They have had chronic back pain for quite some time, more than 3 months, which has been an ongoing situation with recalcitrant axial back pain.  They have no radicular pain.  Their axial pain is worse with standing and ambulating and on exam today with facet loading.  They have had physical therapy as well as home exercise program.  The imaging noted in the chart below indicated facet pathology. Accordingly they meet all the criteria and qualification for for radiofrequency ablation and we are going to complete this today hopefully for more longer term relief as part of comprehensive management program.   ROS Otherwise per HPI.  Assessment & Plan: Visit Diagnoses:    ICD-10-CM   1. Spondylosis without myelopathy or radiculopathy, lumbar region  M47.816 XR C-ARM NO REPORT    Radiofrequency,Lumbar    methylPREDNISolone acetate (DEPO-MEDROL) injection 80 mg      Plan: No additional findings.   Meds & Orders:  Meds ordered this encounter  Medications   methylPREDNISolone acetate (DEPO-MEDROL) injection 80 mg    Orders Placed This Encounter  Procedures   Radiofrequency,Lumbar   XR C-ARM NO REPORT    Follow-up: Return if symptoms worsen or fail to improve.   Procedures: No procedures performed  Lumbar Facet Joint Nerve Denervation  [name]      Date of Birth: 1970/07/20 MRN:  [id number] PCP: Georgina Quint, MD      Visit Date: 09/16/2021   Universal Protocol:    Date/Time: 05/31/235:54 AM  Consent Given By: the patient  Position: PRONE  Additional Comments: Vital signs were monitored before and after the procedure. Patient was prepped and draped in the usual sterile fashion. The correct patient, procedure, and site was verified.   Injection Procedure Details:   Procedure diagnoses:  1. Spondylosis without myelopathy or radiculopathy, lumbar region      Meds Administered:  Meds ordered this encounter  Medications   methylPREDNISolone acetate (DEPO-MEDROL) injection 80 mg     Laterality: Right  Location/Site:  L4-L5, L3 and L4 medial branches  Needle: 18 ga.,  83mm active tip, RF Cannula  Needle Placement: Along juncture of superior articular process and transverse pocess  Findings:  -Comments:  Procedure Details: For each desired target nerve, the corresponding transverse process (sacral ala for the L5 dorsal rami) was identified and the fluoroscope was positioned to square off the endplates of the corresponding vertebral body to achieve a true AP midline view.  The beam was then obliqued 15 to 20 degrees and caudally tilted 15 to 20 degrees to line up a trajectory along the target nerves. The skin over the target of the junction of superior articulating process and transverse process (sacral ala for the L5 dorsal rami) was infiltrated with 45ml of 1% Lidocaine without Epinephrine.  The 18 gauge 17mm active tip outer cannula was advanced in trajectory view to  the target.  This procedure was repeated for each target nerve.  Then, for all levels, the outer cannula placement was fine-tuned and the position was then confirmed with bi-planar imaging.    Test stimulation was done both at sensory and motor levels to ensure there was no radicular stimulation. The target tissues were then infiltrated with 1 ml of 1% Lidocaine without  Epinephrine. Subsequently, a percutaneous neurotomy was carried out for 90 seconds at 80 degrees Celsius.  After the completion of the lesion, 1 ml of injectate was delivered. It was then repeated for each facet joint nerve mentioned above. Appropriate radiographs were obtained to verify the probe placement during the neurotomy.   Additional Comments:  No complications occurred Dressing: 2 x 2 sterile gauze and Band-Aid    Post-procedure details: Patient was observed during the procedure. Post-procedure instructions were reviewed.  Patient left the clinic in stable condition.       Clinical History: MRI LUMBAR SPINE WITHOUT CONTRAST     TECHNIQUE:  Multiplanar, multisequence MR imaging of the lumbar spine was  performed. No intravenous contrast was administered.     COMPARISON:  Prior radiograph from 01/16/2020 and MRI from  02/05/2017.     FINDINGS:  Segmentation:  Standard.     Alignment:  Physiologic.  No listhesis.     Vertebrae: Vertebral body height maintained without acute or chronic  fracture. Bone marrow signal intensity within normal limits. No  discrete or worrisome osseous lesions. Mild reactive marrow edema  noted about the right greater than left L4-5 facets due to facet  arthritis. No other abnormal marrow edema.     Conus medullaris and cauda equina: Conus extends to the L1 level.  Conus and cauda equina appear normal.     Paraspinal and other soft tissues: Unremarkable.     Disc levels:     L1-2:  Unremarkable.     L2-3:  Unremarkable.     L3-4: Normal interspace. Minimal facet spurring. No canal or  foraminal stenosis. No impingement.     L4-5: Disc desiccation. Shallow broad-based left subarticular disc  protrusion indents the left ventral thecal sac, encroaching upon the  left lateral recess. Associated central annular fissure. Moderate  bilateral facet hypertrophy, mildly progressed from previous.  Resultant mild canal with moderate left  lateral recess stenosis,  descending L5 nerve root level. Mild to moderate bilateral L4  foraminal narrowing. Appearance is overall relatively similar to  previous.     L5-S1: Normal interspace. Moderate right with mild left facet  hypertrophy, mildly progressed from previous. No stenosis or  impingement.     IMPRESSION:  1. Shallow left subarticular disc protrusion at L4-5 with resultant  mild canal and moderate left lateral recess stenosis, potentially  affecting the descending left L5 nerve root.  2. Moderate bilateral facet hypertrophy at L4-5 and L5-S1, mildly  progressed from previous, and could contribute to lower back pain.        Electronically Signed    By: Jeannine Boga M.D.    On: 01/27/2020 01:46     Objective:  VS:  HT:    WT:   BMI:     BP:134/90  HR:98bpm  TEMP: ( )  RESP:  Physical Exam Vitals and nursing note reviewed.  Constitutional:      General: He is not in acute distress.    Appearance: Normal appearance. He is not ill-appearing.  HENT:     Head: Normocephalic and atraumatic.     Right Ear:  External ear normal.     Left Ear: External ear normal.     Nose: No congestion.  Eyes:     Extraocular Movements: Extraocular movements intact.  Cardiovascular:     Rate and Rhythm: Normal rate.     Pulses: Normal pulses.  Pulmonary:     Effort: Pulmonary effort is normal. No respiratory distress.  Abdominal:     General: There is no distension.     Palpations: Abdomen is soft.  Musculoskeletal:        General: No tenderness or signs of injury.     Cervical back: Neck supple.     Right lower leg: No edema.     Left lower leg: No edema.     Comments: Patient has good distal strength without clonus. Patient somewhat slow to rise from a seated position to full extension.  There is concordant low back pain with facet loading and lumbar spine extension rotation.  There are no definitive trigger points but the patient is somewhat tender across the  lower back and PSIS.  There is no pain with hip rotation.   Skin:    Findings: No erythema or rash.  Neurological:     General: No focal deficit present.     Mental Status: He is alert and oriented to person, place, and time.     Sensory: No sensory deficit.     Motor: No weakness or abnormal muscle tone.     Coordination: Coordination normal.  Psychiatric:        Mood and Affect: Mood normal.        Behavior: Behavior normal.     Imaging: No results found.

## 2021-09-30 NOTE — Progress Notes (Signed)
Scott [name] - 51 y.o. male MRN [id number]  Date of birth: Sep 12, 1970  Office Visit Note: Visit Date: 09/23/2021 PCP: Horald Pollen, MD Referred by: Horald Pollen, *  Subjective: Chief Complaint  Patient presents with   Lower Back - Pain   HPI:  Scott [name] is a 51 y.o. male who comes in todayfor planned radiofrequency ablation of the Left L4-5 Lumbar facet joints. This would be ablation of the corresponding medial branches and/or dorsal rami.  Patient has had double diagnostic blocks with more than 50% relief.  These are documented on pain diary.  They have had chronic back pain for quite some time, more than 3 months, which has been an ongoing situation with recalcitrant axial back pain.  They have no radicular pain.  Their axial pain is worse with standing and ambulating and on exam today with facet loading.  They have had physical therapy as well as home exercise program.  The imaging noted in the chart below indicated facet pathology. Accordingly they meet all the criteria and qualification for for radiofrequency ablation and we are going to complete this today hopefully for more longer term relief as part of comprehensive management program.   ROS Otherwise per HPI.  Assessment & Plan: Visit Diagnoses:    ICD-10-CM   1. Spondylosis without myelopathy or radiculopathy, lumbar region  M47.816 XR C-ARM NO REPORT    methylPREDNISolone acetate (DEPO-MEDROL) injection 80 mg    Radiofrequency,Lumbar      Plan: No additional findings.   Meds & Orders:  Meds ordered this encounter  Medications   methylPREDNISolone acetate (DEPO-MEDROL) injection 80 mg    Orders Placed This Encounter  Procedures   Radiofrequency,Lumbar   XR C-ARM NO REPORT    Follow-up: Return if symptoms worsen or fail to improve.   Procedures: No procedures performed  Lumbar Facet Joint Nerve Denervation  [name]      Date of Birth: [birth year] MRN:  [id number] PCP: Horald Pollen, MD      Visit Date: 09/23/2021   Universal Protocol:    Date/Time: 05/31/235:56 AM  Consent Given By: the patient  Position: PRONE  Additional Comments: Vital signs were monitored before and after the procedure. Patient was prepped and draped in the usual sterile fashion. The correct patient, procedure, and site was verified.   Injection Procedure Details:   Procedure diagnoses:  1. Spondylosis without myelopathy or radiculopathy, lumbar region      Meds Administered:  Meds ordered this encounter  Medications   methylPREDNISolone acetate (DEPO-MEDROL) injection 80 mg     Laterality: Left  Location/Site:  L4-L5, L3 and L4 medial branches  Needle: 18 ga.,  1mm active tip, 190mm RF Cannula  Needle Placement: Along juncture of superior articular process and transverse pocess  Findings:  -Comments:  Procedure Details: For each desired target nerve, the corresponding transverse process (sacral ala for the L5 dorsal rami) was identified and the fluoroscope was positioned to square off the endplates of the corresponding vertebral body to achieve a true AP midline view.  The beam was then obliqued 15 to 20 degrees and caudally tilted 15 to 20 degrees to line up a trajectory along the target nerves. The skin over the target of the junction of superior articulating process and transverse process (sacral ala for the L5 dorsal rami) was infiltrated with 64ml of 1% Lidocaine without Epinephrine.  The 18 gauge 76mm active tip outer cannula was advanced in trajectory view to  the target.  This procedure was repeated for each target nerve.  Then, for all levels, the outer cannula placement was fine-tuned and the position was then confirmed with bi-planar imaging.    Test stimulation was done both at sensory and motor levels to ensure there was no radicular stimulation. The target tissues were then infiltrated with 1 ml of 1% Lidocaine without  Epinephrine. Subsequently, a percutaneous neurotomy was carried out for 90 seconds at 80 degrees Celsius.  After the completion of the lesion, 1 ml of injectate was delivered. It was then repeated for each facet joint nerve mentioned above. Appropriate radiographs were obtained to verify the probe placement during the neurotomy.   Additional Comments:  No complications occurred Dressing: 2 x 2 sterile gauze and Band-Aid    Post-procedure details: Patient was observed during the procedure. Post-procedure instructions were reviewed.  Patient left the clinic in stable condition.       Clinical History: MRI LUMBAR SPINE WITHOUT CONTRAST     TECHNIQUE:  Multiplanar, multisequence MR imaging of the lumbar spine was  performed. No intravenous contrast was administered.     COMPARISON:  Prior radiograph from 01/16/2020 and MRI from  02/05/2017.     FINDINGS:  Segmentation:  Standard.     Alignment:  Physiologic.  No listhesis.     Vertebrae: Vertebral body height maintained without acute or chronic  fracture. Bone marrow signal intensity within normal limits. No  discrete or worrisome osseous lesions. Mild reactive marrow edema  noted about the right greater than left L4-5 facets due to facet  arthritis. No other abnormal marrow edema.     Conus medullaris and cauda equina: Conus extends to the L1 level.  Conus and cauda equina appear normal.     Paraspinal and other soft tissues: Unremarkable.     Disc levels:     L1-2:  Unremarkable.     L2-3:  Unremarkable.     L3-4: Normal interspace. Minimal facet spurring. No canal or  foraminal stenosis. No impingement.     L4-5: Disc desiccation. Shallow broad-based left subarticular disc  protrusion indents the left ventral thecal sac, encroaching upon the  left lateral recess. Associated central annular fissure. Moderate  bilateral facet hypertrophy, mildly progressed from previous.  Resultant mild canal with moderate left  lateral recess stenosis,  descending L5 nerve root level. Mild to moderate bilateral L4  foraminal narrowing. Appearance is overall relatively similar to  previous.     L5-S1: Normal interspace. Moderate right with mild left facet  hypertrophy, mildly progressed from previous. No stenosis or  impingement.     IMPRESSION:  1. Shallow left subarticular disc protrusion at L4-5 with resultant  mild canal and moderate left lateral recess stenosis, potentially  affecting the descending left L5 nerve root.  2. Moderate bilateral facet hypertrophy at L4-5 and L5-S1, mildly  progressed from previous, and could contribute to lower back pain.        Electronically Signed    By: Jeannine Boga M.D.    On: 01/27/2020 01:46     Objective:  VS:  HT:    WT:   BMI:     BP:126/83  HR:77bpm  TEMP: ( )  RESP:  Physical Exam Vitals and nursing note reviewed.  Constitutional:      General: He is not in acute distress.    Appearance: Normal appearance. He is not ill-appearing.  HENT:     Head: Normocephalic and atraumatic.     Right Ear:  External ear normal.     Left Ear: External ear normal.     Nose: No congestion.  Eyes:     Extraocular Movements: Extraocular movements intact.  Cardiovascular:     Rate and Rhythm: Normal rate.     Pulses: Normal pulses.  Pulmonary:     Effort: Pulmonary effort is normal. No respiratory distress.  Abdominal:     General: There is no distension.     Palpations: Abdomen is soft.  Musculoskeletal:        General: No tenderness or signs of injury.     Cervical back: Neck supple.     Right lower leg: No edema.     Left lower leg: No edema.     Comments: Patient has good distal strength without clonus. Patient somewhat slow to rise from a seated position to full extension.  There is concordant low back pain with facet loading and lumbar spine extension rotation.  There are no definitive trigger points but the patient is somewhat tender across the  lower back and PSIS.  There is no pain with hip rotation.   Skin:    Findings: No erythema or rash.  Neurological:     General: No focal deficit present.     Mental Status: He is alert and oriented to person, place, and time.     Sensory: No sensory deficit.     Motor: No weakness or abnormal muscle tone.     Coordination: Coordination normal.  Psychiatric:        Mood and Affect: Mood normal.        Behavior: Behavior normal.     Imaging: No results found.

## 2021-09-30 NOTE — Procedures (Signed)
Lumbar Facet Joint Nerve Denervation  [name]      Date of Birth: [birth year] MRN: [id number] PCP: Georgina Quint, MD      Visit Date: 09/23/2021   Universal Protocol:    Date/Time: 05/31/235:56 AM  Consent Given By: the patient  Position: PRONE  Additional Comments: Vital signs were monitored before and after the procedure. Patient was prepped and draped in the usual sterile fashion. The correct patient, procedure, and site was verified.   Injection Procedure Details:   Procedure diagnoses:  1. Spondylosis without myelopathy or radiculopathy, lumbar region      Meds Administered:  Meds ordered this encounter  Medications   methylPREDNISolone acetate (DEPO-MEDROL) injection 80 mg     Laterality: Left  Location/Site:  L4-L5, L3 and L4 medial branches  Needle: 18 ga.,  39mm active tip, RF Cannula  Needle Placement: Along juncture of superior articular process and transverse pocess  Findings:  -Comments:  Procedure Details: For each desired target nerve, the corresponding transverse process (sacral ala for the L5 dorsal rami) was identified and the fluoroscope was positioned to square off the endplates of the corresponding vertebral body to achieve a true AP midline view.  The beam was then obliqued 15 to 20 degrees and caudally tilted 15 to 20 degrees to line up a trajectory along the target nerves. The skin over the target of the junction of superior articulating process and transverse process (sacral ala for the L5 dorsal rami) was infiltrated with 52ml of 1% Lidocaine without Epinephrine.  The 18 gauge 24mm active tip outer cannula was advanced in trajectory view to the target.  This procedure was repeated for each target nerve.  Then, for all levels, the outer cannula placement was fine-tuned and the position was then confirmed with bi-planar imaging.    Test stimulation was done both at sensory and motor levels to ensure there was no  radicular stimulation. The target tissues were then infiltrated with 1 ml of 1% Lidocaine without Epinephrine. Subsequently, a percutaneous neurotomy was carried out for 90 seconds at 80 degrees Celsius.  After the completion of the lesion, 1 ml of injectate was delivered. It was then repeated for each facet joint nerve mentioned above. Appropriate radiographs were obtained to verify the probe placement during the neurotomy.   Additional Comments:  No complications occurred Dressing: 2 x 2 sterile gauze and Band-Aid    Post-procedure details: Patient was observed during the procedure. Post-procedure instructions were reviewed.  Patient left the clinic in stable condition.

## 2021-10-01 ENCOUNTER — Ambulatory Visit (INDEPENDENT_AMBULATORY_CARE_PROVIDER_SITE_OTHER): Payer: 59

## 2021-10-01 ENCOUNTER — Ambulatory Visit (INDEPENDENT_AMBULATORY_CARE_PROVIDER_SITE_OTHER): Payer: 59 | Admitting: Sports Medicine

## 2021-10-01 VITALS — BP 130/74 | HR 90 | Ht 66.0 in | Wt 231.0 lb

## 2021-10-01 DIAGNOSIS — M25532 Pain in left wrist: Secondary | ICD-10-CM

## 2021-10-01 DIAGNOSIS — M25552 Pain in left hip: Secondary | ICD-10-CM

## 2021-10-01 DIAGNOSIS — M898X1 Other specified disorders of bone, shoulder: Secondary | ICD-10-CM

## 2021-10-01 MED ORDER — MELOXICAM 15 MG PO TABS: 15.0000 mg | ORAL_TABLET | Freq: Every day | ORAL | 0 refills | Status: DC

## 2021-10-01 NOTE — Patient Instructions (Addendum)
Good to see you - Start meloxicam 15 mg daily x2 weeks.  If still having pain after 2 weeks, complete 3rd-week of meloxicam. May use remaining meloxicam as needed once daily for pain control.  Do not to use additional NSAIDs while taking meloxicam.  May use Tylenol [id number] mg 2 to 3 times a day for breakthrough pain. Can use Voltaren gel over painful areas of the hip  HEP wrist  Can use wrist brace throughout the day  Follow up as needed if no improvement follow up 2-4 weeks

## 2021-10-26 ENCOUNTER — Encounter: Payer: 59 | Admitting: Emergency Medicine

## 2021-11-11 ENCOUNTER — Ambulatory Visit (INDEPENDENT_AMBULATORY_CARE_PROVIDER_SITE_OTHER): Payer: 59 | Admitting: Orthopedic Surgery

## 2021-11-11 DIAGNOSIS — M1712 Unilateral primary osteoarthritis, left knee: Secondary | ICD-10-CM | POA: Diagnosis not present

## 2021-11-14 ENCOUNTER — Encounter: Payer: Self-pay | Admitting: Orthopedic Surgery

## 2021-11-14 MED ORDER — METHYLPREDNISOLONE ACETATE 40 MG/ML IJ SUSP
40.0000 mg | INTRAMUSCULAR | Status: AC | PRN
  Administered 2021-11-11: 40 mg via INTRA_ARTICULAR

## 2021-11-14 MED ORDER — BUPIVACAINE HCL 0.25 % IJ SOLN
4.0000 mL | INTRAMUSCULAR | Status: AC | PRN
  Administered 2021-11-11: 4 mL via INTRA_ARTICULAR

## 2021-11-14 MED ORDER — LIDOCAINE HCL 1 % IJ SOLN
5.0000 mL | INTRAMUSCULAR | Status: AC | PRN
  Administered 2021-11-11: 5 mL

## 2021-11-14 NOTE — Progress Notes (Signed)
Office Visit Note   [name]           Date of Birth: [birth year]           MRN: [id number] Visit Date: 11/11/2021 Requested by: Georgina Quint, MD [address] [place],  [place] [address] PCP: Georgina Quint, MD  Subjective: Chief Complaint  Patient presents with   Left Knee - Injections    HPI: Rudell Cobb is a 51 year old patient with left knee pain.  Had left knee arthroscopy with meniscal cyst decompression.  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  A cortisone injection in February and gel shot in May.  Knee is painful until it warms up.  Uses a sleeve on his left knee which helps.  Ablation with Dr. Alvester Morin helped his low back pain.  He has a bike which he uses for exercise.              ROS: All systems reviewed are negative as they relate to the chief complaint within the history of present illness.  Patient denies  fevers or chills.   Assessment & Plan: Visit Diagnoses:  1. Unilateral primary osteoarthritis, left knee     Plan: Impression is left knee pain with likely slowly progressive worsening of arthritis.  No real effusion today which I think is a good sign.  Ken's quad strength also looks to be very good.  Plan is injection of that left knee today.  Continue with nonweightbearing quad strengthening exercises.  Left foot also examined and the spur removal site has flattened some but is not completely flat yet.  Overall it is improved and the patient can wear shoes.  Follow-up with [place] as needed.  Follow-Up Instructions: No follow-ups on file.   Orders:  No orders of the defined types were placed in this encounter.  No orders of the defined types were placed in this encounter.     Procedures: Large Joint Inj: L knee on 11/11/2021 5:46 PM Indications: diagnostic evaluation, joint swelling and pain Details: 18 G 1.5 in needle, superolateral approach  Arthrogram: No  Medications: 5  mL lidocaine 1 %; 40 mg methylPREDNISolone acetate 40 MG/ML; 4 mL bupivacaine 0.25 % Outcome: tolerated well, no immediate complications Procedure, treatment alternatives, risks and benefits explained, specific risks discussed. Consent was given by the patient. Immediately prior to procedure a time out was called to verify the correct patient, procedure, equipment, support staff and site/side marked as required. Patient was prepped and draped in the usual sterile fashion.       Clinical Data: No additional findings.  Objective: Vital Signs: There were no vitals taken for this visit.  Physical Exam:   Constitutional: Patient appears well-developed HEENT:  Head: Normocephalic Eyes:EOM are normal Neck: Normal range of motion Cardiovascular: Normal rate Pulmonary/chest: Effort normal Neurologic: Patient is alert Skin: Skin is warm Psychiatric: Patient has normal mood and affect   Ortho Exam: Ortho exam demonstrates full active and passive range of motion of the left knee with no effusion.  Collateral crucial ligaments are stable.  Medial joint line tenderness is present.  Negative McMurray compression testing.  No masses lymphadenopathy or skin changes noted in that left knee region.  Specialty Comments:  MRI LUMBAR SPINE WITHOUT CONTRAST     TECHNIQUE:  Multiplanar, multisequence MR imaging of the lumbar spine was  performed. No intravenous contrast was administered.     COMPARISON:  Prior radiograph  from 01/16/2020 and MRI from  02/05/2017.     FINDINGS:  Segmentation:  Standard.     Alignment:  Physiologic.  No listhesis.     Vertebrae: Vertebral body height maintained without acute or chronic  fracture. Bone marrow signal intensity within normal limits. No  discrete or worrisome osseous lesions. Mild reactive marrow edema  noted about the right greater than left L4-5 facets due to facet  arthritis. No other abnormal marrow edema.     Conus medullaris and cauda  equina: Conus extends to the L1 level.  Conus and cauda equina appear normal.     Paraspinal and other soft tissues: Unremarkable.     Disc levels:     L1-2:  Unremarkable.     L2-3:  Unremarkable.     L3-4: Normal interspace. Minimal facet spurring. No canal or  foraminal stenosis. No impingement.     L4-5: Disc desiccation. Shallow broad-based left subarticular disc  protrusion indents the left ventral thecal sac, encroaching upon the  left lateral recess. Associated central annular fissure. Moderate  bilateral facet hypertrophy, mildly progressed from previous.  Resultant mild canal with moderate left lateral recess stenosis,  descending L5 nerve root level. Mild to moderate bilateral L4  foraminal narrowing. Appearance is overall relatively similar to  previous.     L5-S1: Normal interspace. Moderate right with mild left facet  hypertrophy, mildly progressed from previous. No stenosis or  impingement.     IMPRESSION:  1. Shallow left subarticular disc protrusion at L4-5 with resultant  mild canal and moderate left lateral recess stenosis, potentially  affecting the descending left L5 nerve root.  2. Moderate bilateral facet hypertrophy at L4-5 and L5-S1, mildly  progressed from previous, and could contribute to lower back pain.        Electronically Signed    By: Rise Mu M.D.    On: 01/27/2020 01:46  Imaging: No results found.   PMFS History: Patient Active Problem List   Diagnosis Date Noted   GERD (gastroesophageal reflux disease) 03/31/2021   Acute medial meniscal tear, left, subsequent encounter    History of colonic polyps 11/21/2020   Posterior calcaneal exostosis 07/21/2020   Trigger thumb of right hand 07/03/2020   Hyperlipidemia 04/23/2020   Class 2 severe obesity due to excess calories with serious comorbidity and body mass index (BMI) of 37.0 to 37.9 in adult [facility]) 02/13/2019   Sleep apnea 01/23/2019   Arthritis 01/23/2019   Chronic  diastolic heart failure (HCC) 11/07/2016   OSA (obstructive sleep apnea) 04/18/2012   Left ventricular diastolic dysfunction, NYHA class 1/moderate LVH with concentric hypertrophy 09/03/2011   ? DVT of popliteal vein, right (non occlusive) 09/03/2011   Pulmonary embolism (HCC) 09/02/2011   Dyslipidemia associated with type 2 diabetes mellitus (HCC) 09/02/2011   Hypertension associated with diabetes (HCC) 09/02/2011   Past Medical History:  Diagnosis Date   Arthritis    Carpal tunnel syndrome    Chronic migraine 01/23/2019   Diabetes mellitus    Diastolic heart failure (HCC)    GERD (gastroesophageal reflux disease)    Hypertension    Obesity    Pulmonary embolism (HCC) 2013   He has a history of HTN.  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 A repeat scan done in 2017 did not demonstrate evidence of emboli. Neither study demonstrated any coronary calcifications.    Shoulder pain, bilateral    Sleep apnea    was fitted for mask and  never got machine - too costly    Family History  Problem Relation Age of Onset   Hypertension Mother    Kidney disease Father    Hypertension Other    Hyperlipidemia Other    Diabetes Other     Past Surgical History:  Procedure Laterality Date   CARPAL TUNNEL RELEASE Right 12/04/2015   Procedure: RIGHT CARPAL TUNNEL RELEASE;  Surgeon: Betha Loa, MD;  Location: [facility];  Service: Orthopedics;  Laterality: Right;  RIGHT CARPAL TUNNEL RELEASE   CARPAL TUNNEL RELEASE Right 12/2015   [facility]   CARPAL TUNNEL RELEASE Left 04/01/2016   Procedure: LEFT CARPAL TUNNEL RELEASE;  Surgeon: Betha Loa, MD;  Location: [facility];  Service: Orthopedics;  Laterality: Left;   I & D EXTREMITY Left 02/19/2021   Procedure: LEFT FOOT SPUR REMOVAL;  Surgeon: Cammy Copa, MD;  [facility];  Service: Orthopedics;  Laterality: Left;   KNEE ARTHROSCOPY WITH MEDIAL MENISECTOMY Left 02/19/2021    Procedure: LEFT KNEE ARTHROSCOPY, MENISCAL CYST DECOMPRESSION;  Surgeon: Cammy Copa, MD;  Location: [facility] OR;  Service: Orthopedics;  Laterality: Left;   right hand surgery     ROTATOR CUFF REPAIR Right    Social History   Occupational History   Occupation: real estate agent  Tobacco Use   Smoking status: Never   Smokeless tobacco: Never  Vaping Use   Vaping Use: Never used  Substance and Sexual Activity   Alcohol use: No    Alcohol/week: 0.0 standard drinks of alcohol   Drug use: No   Sexual activity: Yes

## 2021-11-16 ENCOUNTER — Encounter: Payer: Self-pay | Admitting: Emergency Medicine

## 2021-11-16 ENCOUNTER — Ambulatory Visit (INDEPENDENT_AMBULATORY_CARE_PROVIDER_SITE_OTHER): Payer: 59 | Admitting: Emergency Medicine

## 2021-11-16 VITALS — BP 122/84 | HR 88 | Temp 98.5°F | Ht 66.0 in | Wt 226.1 lb

## 2021-11-16 DIAGNOSIS — I10 Essential (primary) hypertension: Secondary | ICD-10-CM | POA: Diagnosis not present

## 2021-11-16 DIAGNOSIS — L608 Other nail disorders: Secondary | ICD-10-CM

## 2021-11-16 DIAGNOSIS — I152 Hypertension secondary to endocrine disorders: Secondary | ICD-10-CM

## 2021-11-16 DIAGNOSIS — E785 Hyperlipidemia, unspecified: Secondary | ICD-10-CM

## 2021-11-16 DIAGNOSIS — Z794 Long term (current) use of insulin: Secondary | ICD-10-CM

## 2021-11-16 DIAGNOSIS — Z0001 Encounter for general adult medical examination with abnormal findings: Secondary | ICD-10-CM

## 2021-11-16 DIAGNOSIS — E1169 Type 2 diabetes mellitus with other specified complication: Secondary | ICD-10-CM

## 2021-11-16 DIAGNOSIS — E1159 Type 2 diabetes mellitus with other circulatory complications: Secondary | ICD-10-CM

## 2021-11-16 DIAGNOSIS — E1165 Type 2 diabetes mellitus with hyperglycemia: Secondary | ICD-10-CM

## 2021-11-16 DIAGNOSIS — J069 Acute upper respiratory infection, unspecified: Secondary | ICD-10-CM

## 2021-11-16 MED ORDER — LISINOPRIL 20 MG PO TABS
20.0000 mg | ORAL_TABLET | Freq: Every day | ORAL | 3 refills | Status: DC
Start: 2021-11-16 — End: 2022-02-16

## 2021-11-16 MED ORDER — METFORMIN HCL 500 MG PO TABS: 500.0000 mg | ORAL_TABLET | Freq: Two times a day (BID) | ORAL | 3 refills | Status: DC

## 2021-11-16 NOTE — Patient Instructions (Signed)
Health Maintenance, Male Adopting a healthy lifestyle and getting preventive care are important in promoting health and wellness. Ask your health care provider about: The right schedule for you to have regular tests and exams. Things you can do on your own to prevent diseases and keep yourself healthy. What should I know about diet, weight, and exercise? Eat a healthy diet  Eat a diet that includes plenty of vegetables, fruits, low-fat dairy products, and lean protein. Do not eat a lot of foods that are high in solid fats, added sugars, or sodium. Maintain a healthy weight Body mass index (BMI) is a measurement that can be used to identify possible weight problems. It estimates body fat based on height and weight. Your health care provider can help determine your BMI and help you achieve or maintain a healthy weight. Get regular exercise Get regular exercise. This is one of the most important things you can do for your health. Most adults should: Exercise for at least 150 minutes each week. The exercise should increase your heart rate and make you sweat (moderate-intensity exercise). Do strengthening exercises at least twice a week. This is in addition to the moderate-intensity exercise. Spend less time sitting. Even light physical activity can be beneficial. Watch cholesterol and blood lipids Have your blood tested for lipids and cholesterol at 52 years of age, then have this test every 5 years. You may need to have your cholesterol levels checked more often if: Your lipid or cholesterol levels are high. You are older than 52 years of age. You are at high risk for heart disease. What should I know about cancer screening? Many types of cancers can be detected early and may often be prevented. Depending on your health history and family history, you may need to have cancer screening at various ages. This may include screening for: Colorectal cancer. Prostate cancer. Skin cancer. Lung  cancer. What should I know about heart disease, diabetes, and high blood pressure? Blood pressure and heart disease High blood pressure causes heart disease and increases the risk of stroke. This is more likely to develop in people who have high blood pressure readings or are overweight. Talk with your health care provider about your target blood pressure readings. Have your blood pressure checked: Every 3-5 years if you are 18-39 years of age. Every year if you are 40 years old or older. If you are between the ages of 65 and 75 and are a current or former smoker, ask your health care provider if you should have a one-time screening for abdominal aortic aneurysm (AAA). Diabetes Have regular diabetes screenings. This checks your fasting blood sugar level. Have the screening done: Once every three years after age 45 if you are at a normal weight and have a low risk for diabetes. More often and at a younger age if you are overweight or have a high risk for diabetes. What should I know about preventing infection? Hepatitis B If you have a higher risk for hepatitis B, you should be screened for this virus. Talk with your health care provider to find out if you are at risk for hepatitis B infection. Hepatitis C Blood testing is recommended for: Everyone born from 1945 through 1965. Anyone with known risk factors for hepatitis C. Sexually transmitted infections (STIs) You should be screened each year for STIs, including gonorrhea and chlamydia, if: You are sexually active and are younger than 52 years of age. You are older than 52 years of age and your   health care provider tells you that you are at risk for this type of infection. Your sexual activity has changed since you were last screened, and you are at increased risk for chlamydia or gonorrhea. Ask your health care provider if you are at risk. Ask your health care provider about whether you are at high risk for HIV. Your health care provider  may recommend a prescription medicine to help prevent HIV infection. If you choose to take medicine to prevent HIV, you should first get tested for HIV. You should then be tested every 3 months for as long as you are taking the medicine. Follow these instructions at home: Alcohol use Do not drink alcohol if your health care provider tells you not to drink. If you drink alcohol: Limit how much you have to 0-2 drinks a day. Know how much alcohol is in your drink. In the [place] one drink equals one 12 oz bottle of beer (355 mL), one 5 oz glass of wine (148 mL), or one 1 oz glass of hard liquor (44 mL). Lifestyle Do not use any products that contain nicotine or tobacco. These products include cigarettes, chewing tobacco, and vaping devices, such as e-cigarettes. If you need help quitting, ask your health care provider. Do not use street drugs. Do not share needles. Ask your health care provider for help if you need support or information about quitting drugs. General instructions Schedule regular health, dental, and eye exams. Stay current with your vaccines. Tell your health care provider if: You often feel depressed. You have ever been abused or do not feel safe at home. Summary Adopting a healthy lifestyle and getting preventive care are important in promoting health and wellness. Follow your health care provider's instructions about healthy diet, exercising, and getting tested or screened for diseases. Follow your health care provider's instructions on monitoring your cholesterol and blood pressure. This information is not intended to replace advice given to you by your health care provider. Make sure you discuss any questions you have with your health care provider. Document Revised: 09/08/2020 Document Reviewed: 09/08/2020 [employer] Patient Education  2023 [employer]  

## 2021-11-16 NOTE — Progress Notes (Signed)
Scott [name] 51 y.o.   Chief Complaint  Patient presents with   Annual Exam   Nail Problem    Lt foot big  toe nail, seem like a nail is growing under it    Cough    Sinus issues with cough x 4 days     HISTORY OF PRESENT ILLNESS: This is a 51 y.o. male here for annual exam. Also complaining of flulike symptoms that started 5 days ago progressively getting better. Chronic problem to left big toenail, growing abnormally. No other complaints or medical concerns today. Has history of hypertension, diabetes, and dyslipidemia.  On multiple medications.  Cough Associated symptoms include a sore throat. Pertinent negatives include no chest pain, chills, fever, headaches or rash.     Prior to Admission medications   Medication Sig Start Date End Date Taking? Authorizing Provider  amLODipine (NORVASC) 10 MG tablet Take 1 tablet (10 mg total) by mouth daily. 03/04/21  Yes Maira Christon, Eilleen Kempf, MD  atorvastatin (LIPITOR) 20 MG tablet Take 1 tablet (20 mg total) by mouth daily. 09/08/21 09/03/22 Yes Irine Heminger, Eilleen Kempf, MD  Dulaglutide (TRULICITY) 1.5 MG/0.5ML SOPN Inject 1.5 mg into the skin once a week. 06/04/21  Yes Rakeisha Nyce, Eilleen Kempf, MD  fluticasone [facility]) 50 MCG/ACT nasal spray Place 2 sprays into both nostrils daily. Patient taking differently: Place 2 sprays into both nostrils daily as needed for allergies. 10/08/20  Yes Covington, Sarah M, PA-C  loratadine (CLARITIN) 10 MG tablet Take 10 mg by mouth daily as needed for allergies.   Yes [provider]  meloxicam (MOBIC) 15 MG tablet Take 1 tablet (15 mg total) by mouth daily. 10/01/21  Yes Richardean Sale, DO  trolamine salicylate (BLUE-EMU HEMP) 10 % cream Apply 1 application topically as needed for muscle pain.   Yes [provider]  zinc gluconate 50 MG tablet Take 50 mg by mouth daily.   Yes [provider]  glucose blood test strip Use as instructed. Inject into the skin once daily.  E11.65 Patient not taking: Reported on 11/16/2021 11/05/20   Mayers, Cari S, PA-C  lisinopril (ZESTRIL) 20 MG tablet Take 1 tablet (20 mg total) by mouth daily. 11/16/21 11/11/22  Georgina Quint, MD  metFORMIN (GLUCOPHAGE) 500 MG tablet Take 1 tablet (500 mg total) by mouth 2 (two) times daily with a meal. 11/16/21 11/11/22  [place] Chelf, Eilleen Kempf, MD  Multiple Vitamin (MULTIVITAMIN WITH MINERALS) TABS tablet Take 1 tablet by mouth daily.    [provider]    Allergies  Allergen Reactions   Pollen Extract Other (See Comments)    Patient Active Problem List   Diagnosis Date Noted   GERD (gastroesophageal reflux disease) 03/31/2021   Acute medial meniscal tear, left, subsequent encounter    History of colonic polyps 11/21/2020   Posterior calcaneal exostosis 07/21/2020   Trigger thumb of right hand 07/03/2020   Hyperlipidemia 04/23/2020   Class 2 severe obesity due to excess calories with serious comorbidity and body mass index (BMI) of 37.0 to 37.9 in adult [facility]) 02/13/2019   Sleep apnea 01/23/2019   Arthritis 01/23/2019   Chronic diastolic heart failure (HCC) 11/07/2016   OSA (obstructive sleep apnea) 04/18/2012   Left ventricular diastolic dysfunction, NYHA class 1/moderate LVH with concentric hypertrophy 09/03/2011   ? DVT of popliteal vein, right (non occlusive) 09/03/2011   Pulmonary embolism (HCC) 09/02/2011   Dyslipidemia associated with type 2 diabetes mellitus (HCC) 09/02/2011   Hypertension associated with diabetes (HCC) 09/02/2011  Past Medical History:  Diagnosis Date   Arthritis    Carpal tunnel syndrome    Chronic migraine 01/23/2019   Diabetes mellitus    Diastolic heart failure (HCC)    GERD (gastroesophageal reflux disease)    Hypertension    Obesity    Pulmonary embolism (HCC) 2013   He has a history of HTN.  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 A repeat scan done in 2017 did not demonstrate evidence of emboli.  Neither study demonstrated any coronary calcifications.    Shoulder pain, bilateral    Sleep apnea    was fitted for mask and never got machine - too costly    Past Surgical History:  Procedure Laterality Date   CARPAL TUNNEL RELEASE Right 12/04/2015   Procedure: RIGHT CARPAL TUNNEL RELEASE;  Surgeon: Betha Loa, MD;  Location: [facility];  Service: Orthopedics;  Laterality: Right;  RIGHT CARPAL TUNNEL RELEASE   CARPAL TUNNEL RELEASE Right 12/2015   [facility]   CARPAL TUNNEL RELEASE Left 04/01/2016   Procedure: LEFT CARPAL TUNNEL RELEASE;  Surgeon: Betha Loa, MD;  Location: [facility];  Service: Orthopedics;  Laterality: Left;   I & D EXTREMITY Left 02/19/2021   Procedure: LEFT FOOT SPUR REMOVAL;  Surgeon: Cammy Copa, MD;  Location: [facility] OR;  Service: Orthopedics;  Laterality: Left;   KNEE ARTHROSCOPY WITH MEDIAL MENISECTOMY Left 02/19/2021   Procedure: LEFT KNEE ARTHROSCOPY, MENISCAL CYST DECOMPRESSION;  Surgeon: Cammy Copa, MD;  Location: [facility] OR;  Service: Orthopedics;  Laterality: Left;   right hand surgery     ROTATOR CUFF REPAIR Right     Social History   Socioeconomic History   Marital status: Married    Spouse name: Not on file   Number of children: Not on file   Years of education: 12+   Highest education level: Not on file  Occupational History   Occupation: real estate agent  Tobacco Use   Smoking status: Never   Smokeless tobacco: Never  Vaping Use   Vaping Use: Never used  Substance and Sexual Activity   Alcohol use: No    Alcohol/week: 0.0 standard drinks of alcohol   Drug use: No   Sexual activity: Yes  Other Topics Concern   Not on file  Social History Narrative   Lives at home.   Right-handed.   Occasional caffeine use.   Tea every other day   Social Determinants of Health   Financial Resource Strain: Not on file  Food Insecurity: Not on file  Transportation Needs: Not on file  Physical Activity: Not on  file  Stress: Not on file  Social Connections: Not on file  Intimate Partner Violence: Not on file    Family History  Problem Relation Age of Onset   Hypertension Mother    Kidney disease Father    Hypertension Other    Hyperlipidemia Other    Diabetes Other      Review of Systems  Constitutional: Negative.  Negative for chills and fever.  HENT:  Positive for congestion and sore throat.   Eyes: Negative.   Respiratory:  Positive for cough.   Cardiovascular: Negative.  Negative for chest pain and palpitations.  Gastrointestinal:  Negative for abdominal pain, blood in stool, melena, nausea and vomiting.  Genitourinary: Negative.   Musculoskeletal: Negative.   Skin: Negative.  Negative for rash.  Neurological:  Negative for dizziness and headaches.  All other systems reviewed and are  negative.  Today's Vitals   11/16/21 1042  BP: 122/84  Pulse: 88  Temp: 98.5 F (36.9 C)  SpO2: 96%  Weight: 226 lb 2 oz (102.6 kg)  Height: 5\' 6"  (1.676 m)   Body mass index is 36.5 kg/m. Wt Readings from Last 3 Encounters:  11/16/21 226 lb 2 oz (102.6 kg)  10/01/21 231 lb (104.8 kg)  09/08/21 231 lb (104.8 kg)     Physical Exam Vitals reviewed.  Constitutional:      Appearance: Normal appearance.  HENT:     Head: Normocephalic.     Right Ear: Tympanic membrane, ear canal and external ear normal.     Left Ear: Tympanic membrane, ear canal and external ear normal.     Mouth/Throat:     Mouth: Mucous membranes are moist.     Pharynx: Oropharynx is clear.  Eyes:     Extraocular Movements: Extraocular movements intact.     Conjunctiva/sclera: Conjunctivae normal.     Pupils: Pupils are equal, round, and reactive to light.  Cardiovascular:     Rate and Rhythm: Normal rate and regular rhythm.     Pulses: Normal pulses.     Heart sounds: Normal heart sounds.  Pulmonary:     Effort: Pulmonary effort is normal.     Breath sounds: Normal breath sounds.  Abdominal:     General:  Bowel sounds are normal. There is no distension.     Palpations: Abdomen is soft.     Tenderness: There is no abdominal tenderness.  Musculoskeletal:        General: Normal range of motion.     Cervical back: No tenderness.  Lymphadenopathy:     Cervical: No cervical adenopathy.  Skin:    General: Skin is warm and dry.     Capillary Refill: Capillary refill takes less than 2 seconds.  Neurological:     General: No focal deficit present.     Mental Status: He is alert and oriented to person, place, and time.  Psychiatric:        Mood and Affect: Mood normal.        Behavior: Behavior normal.      ASSESSMENT & PLAN: Problem List Items Addressed This Visit       Cardiovascular and Mediastinum   Hypertension associated with diabetes ([place])   Relevant Medications   lisinopril (ZESTRIL) 20 MG tablet   metFORMIN (GLUCOPHAGE) 500 MG tablet     Endocrine   Dyslipidemia associated with type 2 diabetes mellitus (HCC)   Relevant Medications   lisinopril (ZESTRIL) 20 MG tablet   metFORMIN (GLUCOPHAGE) 500 MG tablet   Other Visit Diagnoses     Encounter for general adult medical examination with abnormal findings    -  Primary   Essential hypertension       Relevant Medications   lisinopril (ZESTRIL) 20 MG tablet   Type 2 diabetes mellitus with hyperglycemia, with long-term current use of insulin (HCC)       Relevant Medications   lisinopril (ZESTRIL) 20 MG tablet   metFORMIN (GLUCOPHAGE) 500 MG tablet   Toenail deformity       Relevant Orders   Ambulatory referral to Podiatry   Viral upper respiratory illness          Modifiable risk factors discussed with patient. Anticipatory guidance according to age provided. The following topics were also discussed: Social Determinants of Health Smoking.  Non-smoker Diet and nutrition and need to decrease amount of daily carbohydrate intake  and daily calories Benefits of exercise Cancer screening and review of most recent  colonoscopy report Vaccinations recommendations Cardiovascular risk assessment Review of multiple chronic medical problems under management Review of all medications Mental health including depression and anxiety Fall and accident prevention  Patient Instructions  Health Maintenance, Male Adopting a healthy lifestyle and getting preventive care are important in promoting health and wellness. Ask your health care provider about: The right schedule for you to have regular tests and exams. Things you can do on your own to prevent diseases and keep yourself healthy. What should I know about diet, weight, and exercise? Eat a healthy diet  Eat a diet that includes plenty of vegetables, fruits, low-fat dairy products, and lean protein. Do not eat a lot of foods that are high in solid fats, added sugars, or sodium. Maintain a healthy weight Body mass index (BMI) is a measurement that can be used to identify possible weight problems. It estimates body fat based on height and weight. Your health care provider can help determine your BMI and help you achieve or maintain a healthy weight. Get regular exercise Get regular exercise. This is one of the most important things you can do for your health. Most adults should: Exercise for at least 150 minutes each week. The exercise should increase your heart rate and make you sweat (moderate-intensity exercise). Do strengthening exercises at least twice a week. This is in addition to the moderate-intensity exercise. Spend less time sitting. Even light physical activity can be beneficial. Watch cholesterol and blood lipids Have your blood tested for lipids and cholesterol at 52 years of age, then have this test every 5 years. You may need to have your cholesterol levels checked more often if: Your lipid or cholesterol levels are high. You are older than 52 years of age. You are at high risk for heart disease. What should I know about cancer  screening? Many types of cancers can be detected early and may often be prevented. Depending on your health history and family history, you may need to have cancer screening at various ages. This may include screening for: Colorectal cancer. Prostate cancer. Skin cancer. Lung cancer. What should I know about heart disease, diabetes, and high blood pressure? Blood pressure and heart disease High blood pressure causes heart disease and increases the risk of stroke. This is more likely to develop in people who have high blood pressure readings or are overweight. Talk with your health care provider about your target blood pressure readings. Have your blood pressure checked: Every 3-5 years if you are 34-76 years of age. Every year if you are 83 years old or older. If you are between the ages of 42 and 73 and are a current or former smoker, ask your health care provider if you should have a one-time screening for abdominal aortic aneurysm (AAA). Diabetes Have regular diabetes screenings. This checks your fasting blood sugar level. Have the screening done: Once every three years after age 79 if you are at a normal weight and have a low risk for diabetes. More often and at a younger age if you are overweight or have a high risk for diabetes. What should I know about preventing infection? Hepatitis B If you have a higher risk for hepatitis B, you should be screened for this virus. Talk with your health care provider to find out if you are at risk for hepatitis B infection. Hepatitis C Blood testing is recommended for: Everyone born from [birth year] through 1966.  Anyone with known risk factors for hepatitis C. Sexually transmitted infections (STIs) You should be screened each year for STIs, including gonorrhea and chlamydia, if: You are sexually active and are younger than 52 years of age. You are older than 52 years of age and your health care provider tells you that you are at risk for this type of  infection. Your sexual activity has changed since you were last screened, and you are at increased risk for chlamydia or gonorrhea. Ask your health care provider if you are at risk. Ask your health care provider about whether you are at high risk for HIV. Your health care provider may recommend a prescription medicine to help prevent HIV infection. If you choose to take medicine to prevent HIV, you should first get tested for HIV. You should then be tested every 3 months for as long as you are taking the medicine. Follow these instructions at home: Alcohol use Do not drink alcohol if your health care provider tells you not to drink. If you drink alcohol: Limit how much you have to 0-2 drinks a day. Know how much alcohol is in your drink. In the [place] one drink equals one 12 oz bottle of beer (355 mL), one 5 oz glass of wine (148 mL), or one 1 oz glass of hard liquor (44 mL). Lifestyle Do not use any products that contain nicotine or tobacco. These products include cigarettes, chewing tobacco, and vaping devices, such as e-cigarettes. If you need help quitting, ask your health care provider. Do not use street drugs. Do not share needles. Ask your health care provider for help if you need support or information about quitting drugs. General instructions Schedule regular health, dental, and eye exams. Stay current with your vaccines. Tell your health care provider if: You often feel depressed. You have ever been abused or do not feel safe at home. Summary Adopting a healthy lifestyle and getting preventive care are important in promoting health and wellness. Follow your health care provider's instructions about healthy diet, exercising, and getting tested or screened for diseases. Follow your health care provider's instructions on monitoring your cholesterol and blood pressure. This information is not intended to replace advice given to you by your health care provider. Make sure you discuss  any questions you have with your health care provider. Document Revised: 09/08/2020 Document Reviewed: 09/08/2020 Elsevier Patient Education  [place], MD [facility]

## 2021-11-23 ENCOUNTER — Telehealth: Payer: Self-pay | Admitting: Orthopedic Surgery

## 2021-11-23 DIAGNOSIS — M47816 Spondylosis without myelopathy or radiculopathy, lumbar region: Secondary | ICD-10-CM

## 2021-11-23 NOTE — Telephone Encounter (Signed)
I think this may be coming from his back.  Did have left sided subarticular disc at L4-5 2 years ago.  Maybe that has gotten worse or there is a new problem.  I would favor scanning back since he had no relief from cortisone injection.  His back scan is unchanged or better then this is something he either has to live with or consider knee replacement 4.

## 2021-11-23 NOTE — Telephone Encounter (Signed)
Scott [name] had an cortisone injection in his L knee and he has not and relief of symptoms. He is requesting a call back to discuss the next steps if he were to come in for another appt. Please advise

## 2021-11-24 ENCOUNTER — Ambulatory Visit (INDEPENDENT_AMBULATORY_CARE_PROVIDER_SITE_OTHER): Payer: 59 | Admitting: Podiatry

## 2021-11-24 DIAGNOSIS — L600 Ingrowing nail: Secondary | ICD-10-CM

## 2021-11-24 DIAGNOSIS — L603 Nail dystrophy: Secondary | ICD-10-CM

## 2021-11-24 NOTE — Patient Instructions (Signed)
I will call you once I get the results back on the culture. If you don't hear back in 3 weeks, please let me know.   You can start a BIOTIN  supplement or a vitamin for "hair, skin and nails".   If was nice to meet you today. If you have any questions or any further concerns, please feel fee to give me a call. You can call our office at [phone] or please feel fee to send me a message through MyChart.

## 2021-11-24 NOTE — Addendum Note (Signed)
Addended by: Philipp Deputy A on: 11/24/2021 05:32 PM   Modules accepted: Orders

## 2021-11-24 NOTE — Progress Notes (Signed)
Subjective:   Patient ID: [name], male   DOB: 51 y.o.   MRN: [id number]   HPI Chief Complaint  Patient presents with   Nail Problem    Pt came in with new nail growing  under old nail, no pain    51 year old male presents the office with the above complaint.  He states the nail has split and the nail is loose but there is new nail coming in.  He thought it there is some fungus.  He states he did fall from ladder previously which may have injured the toenail.  No foot pain after the fall he did have treatment after the fall.  He has no pain in the nail no swelling redness or drainage.  No open lesions.  Review of Systems  All other systems reviewed and are negative.  Past Medical History:  Diagnosis Date   Arthritis    Carpal tunnel syndrome    Chronic migraine 01/23/2019   Diabetes mellitus    Diastolic heart failure (HCC)    GERD (gastroesophageal reflux disease)    Hypertension    Obesity    Pulmonary embolism (HCC) 2013   He has a history of HTN.  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 A repeat scan done in 2017 did not demonstrate evidence of emboli. Neither study demonstrated any coronary calcifications.    Shoulder pain, bilateral    Sleep apnea    was fitted for mask and never got machine - too costly    Past Surgical History:  Procedure Laterality Date   CARPAL TUNNEL RELEASE Right 12/04/2015   Procedure: RIGHT CARPAL TUNNEL RELEASE;  Surgeon: Betha Loa, MD;  Location: [facility];  Service: Orthopedics;  Laterality: Right;  RIGHT CARPAL TUNNEL RELEASE   CARPAL TUNNEL RELEASE Right 12/2015   [facility]   CARPAL TUNNEL RELEASE Left 04/01/2016   Procedure: LEFT CARPAL TUNNEL RELEASE;  Surgeon: Betha Loa, MD;  Location: [facility];  Service: Orthopedics;  Laterality: Left;   I & D EXTREMITY Left 02/19/2021   Procedure: LEFT FOOT SPUR REMOVAL;  Surgeon: Cammy Copa, MD;  Location: [facility];   Service: Orthopedics;  Laterality: Left;   KNEE ARTHROSCOPY WITH MEDIAL MENISECTOMY Left 02/19/2021   Procedure: LEFT KNEE ARTHROSCOPY, MENISCAL CYST DECOMPRESSION;  Surgeon: Cammy Copa, MD;  Location: [facility] OR;  Service: Orthopedics;  Laterality: Left;   right hand surgery     ROTATOR CUFF REPAIR Right      Current Outpatient Medications:    amLODipine (NORVASC) 10 MG tablet, Take 1 tablet (10 mg total) by mouth daily., Disp: 90 tablet, Rfl: 3   atorvastatin (LIPITOR) 20 MG tablet, Take 1 tablet (20 mg total) by mouth daily., Disp: 90 tablet, Rfl: 3   Dulaglutide (TRULICITY) 1.5 MG/0.5ML SOPN, Inject 1.5 mg into the skin once a week., Disp: 2 mL, Rfl: 7   fluticasone (FLONASE) 50 MCG/ACT nasal spray, Place 2 sprays into both nostrils daily. (Patient taking differently: Place 2 sprays into both nostrils daily as needed for allergies.), Disp: 16 mL, Rfl: 0   glucose blood test strip, Use as instructed. Inject into the skin once daily. E11.65 (Patient not taking: Reported on 11/16/2021), Disp: 200 each, Rfl: 12   lisinopril (ZESTRIL) 20 MG tablet, Take 1 tablet (20 mg total) by mouth daily., Disp: 90 tablet, Rfl: 3   loratadine (CLARITIN) 10 MG tablet, Take 10 mg by mouth daily as needed for allergies.,  Disp: , Rfl:    meloxicam (MOBIC) 15 MG tablet, Take 1 tablet (15 mg total) by mouth daily., Disp: 30 tablet, Rfl: 0   metFORMIN (GLUCOPHAGE) 500 MG tablet, Take 1 tablet (500 mg total) by mouth 2 (two) times daily with a meal., Disp: 180 tablet, Rfl: 3   Multiple Vitamin (MULTIVITAMIN WITH MINERALS) TABS tablet, Take 1 tablet by mouth daily., Disp: , Rfl:    trolamine salicylate (BLUE-EMU HEMP) 10 % cream, Apply 1 application topically as needed for muscle pain., Disp: , Rfl:    zinc gluconate 50 MG tablet, Take 50 mg by mouth daily., Disp: , Rfl:   Allergies  Allergen Reactions   Pollen Extract Other (See Comments)          Objective:  Physical Exam  General: AAO x3,  NAD  Dermatological: Left hallux nail appears to have a new nail growth along the proximal nail fold.  On the central portion there is a transverse splitting and the nail distally is loose.  The nail is hypertrophic with yellow, brown discoloration.  There is no edema, erythema, drainage or pus or signs of infection.  Minimal incurvation of the nail borders.  No signs of infection.  No open lesions.  Vascular: Dorsalis Pedis artery and Posterior Tibial artery pedal pulses are 2/4 bilateral with immedate capillary fill time. There is no pain with calf compression, swelling, warmth, erythema.   Neruologic: Grossly intact via light touch bilateral.   Musculoskeletal: Muscular strength 5/5 in all groups tested bilateral.  Gait: Unassisted, Nonantalgic.       Assessment:   Onychodystrophy, likely onychomycosis     Plan:  -Treatment options discussed including all alternatives, risks, and complications -Etiology of symptoms were discussed -Sharp debride the loose nail without any complications or bleeding.  In the nail for culture, pathology to [facility] labs.  -Has been using a fungus cream and continue with this.  Also discussed biotin supplement. -Monitor for any signs or symptoms of infection of the ingrown toenail/nail  Return for nail culture results.  Vivi Barrack DPM

## 2021-11-24 NOTE — Telephone Encounter (Signed)
IC and advised per Dr Alfonso Patten note. Placed order for MRI scan

## 2021-11-30 ENCOUNTER — Encounter: Payer: Self-pay | Admitting: Podiatry

## 2021-12-03 ENCOUNTER — Other Ambulatory Visit: Payer: Self-pay | Admitting: Podiatry

## 2021-12-03 DIAGNOSIS — Z79899 Other long term (current) drug therapy: Secondary | ICD-10-CM

## 2021-12-03 MED ORDER — TERBINAFINE HCL 250 MG PO TABS: 250.0000 mg | ORAL_TABLET | Freq: Every day | ORAL | 0 refills | Status: DC

## 2021-12-05 ENCOUNTER — Ambulatory Visit
Admission: RE | Admit: 2021-12-05 | Discharge: 2021-12-05 | Disposition: A | Discharge status: Home / Self Care | Payer: 59 | Source: Ambulatory Visit | Attending: Orthopedic Surgery | Admitting: Orthopedic Surgery

## 2021-12-05 DIAGNOSIS — M47816 Spondylosis without myelopathy or radiculopathy, lumbar region: Secondary | ICD-10-CM

## 2021-12-17 ENCOUNTER — Ambulatory Visit: Payer: Self-pay

## 2021-12-17 ENCOUNTER — Ambulatory Visit (INDEPENDENT_AMBULATORY_CARE_PROVIDER_SITE_OTHER): Payer: 59

## 2021-12-17 ENCOUNTER — Ambulatory Visit (INDEPENDENT_AMBULATORY_CARE_PROVIDER_SITE_OTHER): Payer: 59 | Admitting: Orthopedic Surgery

## 2021-12-17 DIAGNOSIS — M79672 Pain in left foot: Secondary | ICD-10-CM

## 2021-12-17 DIAGNOSIS — M25562 Pain in left knee: Secondary | ICD-10-CM | POA: Diagnosis not present

## 2021-12-17 DIAGNOSIS — M1712 Unilateral primary osteoarthritis, left knee: Secondary | ICD-10-CM | POA: Diagnosis not present

## 2021-12-17 DIAGNOSIS — G8929 Other chronic pain: Secondary | ICD-10-CM | POA: Diagnosis not present

## 2021-12-17 NOTE — Progress Notes (Signed)
Office Visit Note   [name]           Date of Birth: 01-24-1971           MRN: [id number] Visit Date: 12/17/2021 Requested by: Georgina Quint, MD [address] [place],  [place] [address] PCP: Georgina Quint, MD  Subjective: Chief Complaint  Patient presents with   Lower Back - Follow-up    HPI: Scott [name] is a 51 year old patient here for review of his MRI lumbar spine.  He has had a prior ablation which helped.  Gel injection 523 did not really give him much help.  Has some aching in the knee at night.  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  Doing a lot of stretching.  Hard for him to sleep with his leg extended.  The knee stiffens up when he sits down for a prolonged period of time.  Most of his pain is medial.  Did have some grade 3 changes on the medial side at the time of arthroscopy.  Those photos are reviewed. 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  I would not expect this particular finding to be correlated with knee pain only.              ROS: All systems reviewed are negative as they relate to the chief complaint within the history of present illness.  Patient denies  fevers or chills.   Assessment & Plan: Visit Diagnoses:  1. Chronic pain of left knee   2. Pain of left heel     Plan: Plan is left knee pain in a young patient who may be heading for knee replacement.  Would like to try an injection in the knee first.  I think the MRI scan shows left-sided changes but not so much that I would pursue further injections in the back unless the knee injection gives him no relief.  Continue with nonweightbearing quad strengthening exercises.  Heel on the left-hand side is also examined today and radiographs do not show any regrowth of the small spur but there is some relatively hard scar tissue around the incision site.  No evidence of infection.  Soft tissue massage may help this to  soften over  time.  Follow-Up Instructions: No follow-ups on file.   Orders:  Orders Placed This Encounter  Procedures   XR Os Calcis Left   No orders of the defined types were placed in this encounter.     Procedures: Large Joint Inj: L knee on 12/17/2021 8:29 PM Indications: diagnostic evaluation, joint swelling and pain Details: 18 G 1.5 in needle, superolateral approach  Arthrogram: No  Medications: 5 mL lidocaine 1 %; 40 mg methylPREDNISolone acetate 40 MG/ML; 4 mL bupivacaine 0.25 % Outcome: tolerated well, no immediate complications Procedure, treatment alternatives, risks and benefits explained, specific risks discussed. Consent was given by the patient. Immediately prior to procedure a time out was called to verify the correct patient, procedure, equipment, support staff and site/side marked as required. Patient was prepped and draped in the usual sterile fashion.       Clinical Data: No additional findings.  Objective: Vital Signs: There were no vitals taken for this visit.  Physical Exam:   Constitutional: Patient appears well-developed HEENT:  Head: Normocephalic Eyes:EOM are normal Neck: Normal range of motion Cardiovascular: Normal rate Pulmonary/chest: Effort normal Neurologic: Patient is alert Skin: Skin is warm Psychiatric: Patient has normal mood  and affect   Ortho Exam: Ortho exam demonstrates full range of motion of the left knee.  No effusion.  Intact extensor mechanism.  Medial greater than lateral joint line tenderness.  Pedal pulses palpable.  Does have some scar tissue around that a excision site on the pressure lateral aspect of the calcaneus.  No fluctuance or erythema in this area.  No nerve root tension signs.  No groin pain on the left with internal or external rotation of the leg.  Specialty Comments:  MRI LUMBAR SPINE WITHOUT CONTRAST     TECHNIQUE:  Multiplanar, multisequence MR imaging of the lumbar spine was  performed. No  intravenous contrast was administered.     COMPARISON:  Prior radiograph from 01/16/2020 and MRI from  02/05/2017.     FINDINGS:  Segmentation:  Standard.     Alignment:  Physiologic.  No listhesis.     Vertebrae: Vertebral body height maintained without acute or chronic  fracture. Bone marrow signal intensity within normal limits. No  discrete or worrisome osseous lesions. Mild reactive marrow edema  noted about the right greater than left L4-5 facets due to facet  arthritis. No other abnormal marrow edema.     Conus medullaris and cauda equina: Conus extends to the L1 level.  Conus and cauda equina appear normal.     Paraspinal and other soft tissues: Unremarkable.     Disc levels:     L1-2:  Unremarkable.     L2-3:  Unremarkable.     L3-4: Normal interspace. Minimal facet spurring. No canal or  foraminal stenosis. No impingement.     L4-5: Disc desiccation. Shallow broad-based left subarticular disc  protrusion indents the left ventral thecal sac, encroaching upon the  left lateral recess. Associated central annular fissure. Moderate  bilateral facet hypertrophy, mildly progressed from previous.  Resultant mild canal with moderate left lateral recess stenosis,  descending L5 nerve root level. Mild to moderate bilateral L4  foraminal narrowing. Appearance is overall relatively similar to  previous.     L5-S1: Normal interspace. Moderate right with mild left facet  hypertrophy, mildly progressed from previous. No stenosis or  impingement.     IMPRESSION:  1. Shallow left subarticular disc protrusion at L4-5 with resultant  mild canal and moderate left lateral recess stenosis, potentially  affecting the descending left L5 nerve root.  2. Moderate bilateral facet hypertrophy at L4-5 and L5-S1, mildly  progressed from previous, and could contribute to lower back pain.        Electronically Signed    By: Rise Mu M.D.    On: 01/27/2020  01:46  Imaging: XR Os Calcis Left  Result Date: 12/17/2021 2 views calcaneus left reviewed.  No acute fracture.  No osteophyte formation.  No degenerative changes in the midfoot or tibiotalar joint    PMFS History: Patient Active Problem List   Diagnosis Date Noted   GERD (gastroesophageal reflux disease) 03/31/2021   Acute medial meniscal tear, left, subsequent encounter    History of colonic polyps 11/21/2020   Posterior calcaneal exostosis 07/21/2020   Trigger thumb of right hand 07/03/2020   Hyperlipidemia 04/23/2020   Class 2 severe obesity due to excess calories with serious comorbidity and body mass index (BMI) of 37.0 to 37.9 in adult [facility]) 02/13/2019   Sleep apnea 01/23/2019   Arthritis 01/23/2019   Chronic diastolic heart failure (HCC) 11/07/2016   OSA (obstructive sleep apnea) 04/18/2012   Left ventricular diastolic dysfunction, NYHA class 1/moderate LVH with concentric  hypertrophy 09/03/2011   ? DVT of popliteal vein, right (non occlusive) 09/03/2011   Pulmonary embolism ([place]) 09/02/2011   Dyslipidemia associated with type 2 diabetes mellitus ([place]) 09/02/2011   Hypertension associated with diabetes ([place]) 09/02/2011   Past Medical History:  Diagnosis Date   Arthritis    Carpal tunnel syndrome    Chronic migraine 01/23/2019   Diabetes mellitus    Diastolic heart failure (HCC)    GERD (gastroesophageal reflux disease)    Hypertension    Obesity    Pulmonary embolism ([place]) 2013   He has a history of HTN.  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 A repeat scan done in 2017 did not demonstrate evidence of emboli. Neither study demonstrated any coronary calcifications.    Shoulder pain, bilateral    Sleep apnea    was fitted for mask and never got machine - too costly    Family History  Problem Relation Age of Onset   Hypertension Mother    Kidney disease Father    Hypertension Other    Hyperlipidemia Other    Diabetes Other      Past Surgical History:  Procedure Laterality Date   CARPAL TUNNEL RELEASE Right 12/04/2015   Procedure: RIGHT CARPAL TUNNEL RELEASE;  Surgeon: Leanora Cover, MD;  Location: [place];  Service: Orthopedics;  Laterality: Right;  RIGHT CARPAL TUNNEL RELEASE   CARPAL TUNNEL RELEASE Right 12/2015   [place]   CARPAL TUNNEL RELEASE Left 04/01/2016   Procedure: LEFT CARPAL TUNNEL RELEASE;  Surgeon: Leanora Cover, MD;  Location: [place];  Service: Orthopedics;  Laterality: Left;   I & D EXTREMITY Left 02/19/2021   Procedure: LEFT FOOT SPUR REMOVAL;  Surgeon: Meredith Pel, MD;  Location: [place];  Service: Orthopedics;  Laterality: Left;   KNEE ARTHROSCOPY WITH MEDIAL MENISECTOMY Left 02/19/2021   Procedure: LEFT KNEE ARTHROSCOPY, MENISCAL CYST DECOMPRESSION;  Surgeon: Meredith Pel, MD;  Location: [place];  Service: Orthopedics;  Laterality: Left;   right hand surgery     ROTATOR CUFF REPAIR Right    Social History   Occupational History   Occupation: real estate agent  Tobacco Use   Smoking status: Never   Smokeless tobacco: Never  Vaping Use   Vaping Use: Never used  Substance and Sexual Activity   Alcohol use: No    Alcohol/week: 0.0 standard drinks of alcohol   Drug use: No   Sexual activity: Yes

## 2021-12-18 ENCOUNTER — Encounter: Payer: Self-pay | Admitting: Orthopedic Surgery

## 2021-12-18 MED ORDER — BUPIVACAINE HCL 0.25 % IJ SOLN
4.0000 mL | INTRAMUSCULAR | Status: AC | PRN
  Administered 2021-12-17: 4 mL via INTRA_ARTICULAR

## 2021-12-18 MED ORDER — LIDOCAINE HCL 1 % IJ SOLN
5.0000 mL | INTRAMUSCULAR | Status: AC | PRN
  Administered 2021-12-17: 5 mL

## 2021-12-18 MED ORDER — METHYLPREDNISOLONE ACETATE 40 MG/ML IJ SUSP
40.0000 mg | INTRAMUSCULAR | Status: AC | PRN
  Administered 2021-12-17: 40 mg via INTRA_ARTICULAR

## 2022-01-06 LAB — CBC WITH DIFFERENTIAL/PLATELET
Basophils Absolute: 0 10*3/uL (ref 0.0–0.2)
Basos: 1 %
EOS (ABSOLUTE): 0.1 10*3/uL (ref 0.0–0.4)
Eos: 1 %
Hematocrit: 49.6 % (ref 37.5–51.0)
Hemoglobin: 15.7 g/dL (ref 13.0–17.7)
Immature Grans (Abs): 0 10*3/uL (ref 0.0–0.1)
Immature Granulocytes: 0 %
Lymphocytes Absolute: 2 10*3/uL (ref 0.7–3.1)
Lymphs: 42 %
MCH: 24.6 pg — ABNORMAL LOW (ref 26.6–33.0)
MCHC: 31.7 g/dL (ref 31.5–35.7)
MCV: 78 fL — ABNORMAL LOW (ref 79–97)
Monocytes Absolute: 0.6 10*3/uL (ref 0.1–0.9)
Monocytes: 12 %
Neutrophils Absolute: 2.1 10*3/uL (ref 1.4–7.0)
Neutrophils: 44 %
Platelets: 254 10*3/uL (ref 150–450)
RBC: 6.37 x10E6/uL — ABNORMAL HIGH (ref 4.14–5.80)
RDW: 14.2 % (ref 11.6–15.4)
WBC: 4.7 10*3/uL (ref 3.4–10.8)

## 2022-01-06 LAB — HEPATIC FUNCTION PANEL
ALT: 43 IU/L (ref 0–44)
AST: 21 IU/L (ref 0–40)
Albumin: 4.6 g/dL (ref 3.8–4.9)
Alkaline Phosphatase: 61 IU/L (ref 44–121)
Bilirubin Total: 0.4 mg/dL (ref 0.0–1.2)
Bilirubin, Direct: 0.13 mg/dL (ref 0.00–0.40)
Total Protein: 7.2 g/dL (ref 6.0–8.5)

## 2022-02-04 ENCOUNTER — Telehealth: Payer: Self-pay | Admitting: Orthopedic Surgery

## 2022-02-04 NOTE — Telephone Encounter (Signed)
Sure thx

## 2022-02-04 NOTE — Telephone Encounter (Signed)
Patient came in today requesting a new Handicap placard the one be bought in August to be filled out was not due until October and he would like a new one with the October date on it so it was last longer.

## 2022-02-05 NOTE — Telephone Encounter (Signed)
IC advised could pick up at front desk.  

## 2022-02-12 ENCOUNTER — Other Ambulatory Visit: Payer: Self-pay | Admitting: Emergency Medicine

## 2022-02-12 DIAGNOSIS — I152 Hypertension secondary to endocrine disorders: Secondary | ICD-10-CM

## 2022-02-16 ENCOUNTER — Encounter: Payer: Self-pay | Admitting: Emergency Medicine

## 2022-02-16 ENCOUNTER — Ambulatory Visit (INDEPENDENT_AMBULATORY_CARE_PROVIDER_SITE_OTHER): Payer: 59 | Admitting: Emergency Medicine

## 2022-02-16 VITALS — BP 128/74 | HR 85 | Temp 98.1°F | Ht 66.0 in | Wt 226.0 lb

## 2022-02-16 DIAGNOSIS — E1165 Type 2 diabetes mellitus with hyperglycemia: Secondary | ICD-10-CM | POA: Diagnosis not present

## 2022-02-16 DIAGNOSIS — E1169 Type 2 diabetes mellitus with other specified complication: Secondary | ICD-10-CM

## 2022-02-16 DIAGNOSIS — G4733 Obstructive sleep apnea (adult) (pediatric): Secondary | ICD-10-CM

## 2022-02-16 DIAGNOSIS — Z794 Long term (current) use of insulin: Secondary | ICD-10-CM

## 2022-02-16 DIAGNOSIS — K219 Gastro-esophageal reflux disease without esophagitis: Secondary | ICD-10-CM

## 2022-02-16 DIAGNOSIS — E785 Hyperlipidemia, unspecified: Secondary | ICD-10-CM

## 2022-02-16 DIAGNOSIS — I152 Hypertension secondary to endocrine disorders: Secondary | ICD-10-CM

## 2022-02-16 DIAGNOSIS — E1159 Type 2 diabetes mellitus with other circulatory complications: Secondary | ICD-10-CM

## 2022-02-16 DIAGNOSIS — I10 Essential (primary) hypertension: Secondary | ICD-10-CM | POA: Diagnosis not present

## 2022-02-16 LAB — POCT GLYCOSYLATED HEMOGLOBIN (HGB A1C): Hemoglobin A1C: 6.9 % — AB (ref 4.0–5.6)

## 2022-02-16 MED ORDER — LISINOPRIL 20 MG PO TABS
20.0000 mg | ORAL_TABLET | Freq: Every day | ORAL | 3 refills | Status: DC
Start: 2022-02-16 — End: 2023-03-02

## 2022-02-16 MED ORDER — EMPAGLIFLOZIN 10 MG PO TABS: 10.0000 mg | ORAL_TABLET | Freq: Every day | ORAL | 3 refills | Status: DC

## 2022-02-16 NOTE — Assessment & Plan Note (Signed)
Stable.  Diet and nutrition discussed. Continue atorvastatin 20 mg daily. 

## 2022-02-16 NOTE — Assessment & Plan Note (Signed)
Stable.  On CPAP treatment. 

## 2022-02-16 NOTE — Assessment & Plan Note (Signed)
Stable

## 2022-02-16 NOTE — Patient Instructions (Signed)

## 2022-02-16 NOTE — Progress Notes (Signed)
Scott [name] 51 y.o.   Chief Complaint  Patient presents with   Follow-up    3 mnth f/u appt, no concerns     HISTORY OF PRESENT ILLNESS: This is a 51 y.o. male here for 41-month follow-up of diabetes, hypertension, dyslipidemia. Overall doing well.  Has no complaints or medical concerns today. Lab Results  Component Value Date   HGBA1C 7.0 (A) 09/08/2021   BP Readings from Last 3 Encounters:  02/16/22 128/74  11/16/21 122/84  10/01/21 130/74   Wt Readings from Last 3 Encounters:  02/16/22 226 lb (102.5 kg)  11/16/21 226 lb 2 oz (102.6 kg)  10/01/21 231 lb (104.8 kg)     HPI   Prior to Admission medications   Medication Sig Start Date End Date Taking? Authorizing Provider  amLODipine (NORVASC) 10 MG tablet Take 1 tablet (10 mg total) by mouth daily. 03/04/21  Yes Elvert Cumpton, Eilleen Kempf, MD  atorvastatin (LIPITOR) 20 MG tablet Take 1 tablet (20 mg total) by mouth daily. 09/08/21 09/03/22 Yes Tanyiah Laurich, Eilleen Kempf, MD  empagliflozin (JARDIANCE) 10 MG TABS tablet Take 10 mg by mouth daily.   Yes [provider]  fluticasone (FLONASE) 50 MCG/ACT nasal spray Place 2 sprays into both nostrils daily. Patient taking differently: Place 2 sprays into both nostrils daily as needed for allergies. 10/08/20  Yes CovingtonMaralyn Sago M, PA-C  glucose blood test strip Use as instructed. Inject into the skin once daily. E11.65 11/05/20  Yes Mayers, Cari S, PA-C  lisinopril (ZESTRIL) 20 MG tablet Take 1 tablet (20 mg total) by mouth daily. 11/16/21 11/11/22 Yes Eleny Cortez, Eilleen Kempf, MD  loratadine (CLARITIN) 10 MG tablet Take 10 mg by mouth daily as needed for allergies.   Yes [provider]  meloxicam (MOBIC) 15 MG tablet Take 1 tablet (15 mg total) by mouth daily. 10/01/21  Yes Richardean Sale, DO  metFORMIN (GLUCOPHAGE) 500 MG tablet Take 1 tablet (500 mg total) by mouth 2 (two) times daily with a meal. 11/16/21 11/11/22 Yes Gunnar Hereford, Eilleen Kempf, MD  Multiple Vitamin  (MULTIVITAMIN WITH MINERALS) TABS tablet Take 1 tablet by mouth daily.   Yes [provider]  terbinafine (LAMISIL) 250 MG tablet Take 1 tablet (250 mg total) by mouth daily. 12/03/21  Yes Vivi Barrack, DPM  trolamine salicylate (BLUE-EMU HEMP) 10 % cream Apply 1 application topically as needed for muscle pain.   Yes [provider]  TRULICITY 1.5 MG/0.5ML SOPN INJECT 1.5 MG UNDER THE SKIN ONCE WEEKLY 02/13/22  Yes Takumi Din, Eilleen Kempf, MD  zinc gluconate 50 MG tablet Take 50 mg by mouth daily.   Yes [provider]    Allergies  Allergen Reactions   Pollen Extract Other (See Comments)    Patient Active Problem List   Diagnosis Date Noted   GERD (gastroesophageal reflux disease) 03/31/2021   Acute medial meniscal tear, left, subsequent encounter    History of colonic polyps 11/21/2020   Posterior calcaneal exostosis 07/21/2020   Trigger thumb of right hand 07/03/2020   Hyperlipidemia 04/23/2020   Class 2 severe obesity due to excess calories with serious comorbidity and body mass index (BMI) of 37.0 to 37.9 in adult [facility]) 02/13/2019   Sleep apnea 01/23/2019   Arthritis 01/23/2019   Chronic diastolic heart failure (HCC) 11/07/2016   OSA (obstructive sleep apnea) 04/18/2012   Left ventricular diastolic dysfunction, NYHA class 1/moderate LVH with concentric hypertrophy 09/03/2011   ? DVT of popliteal vein, right (non occlusive) 09/03/2011   Pulmonary  embolism ([place]) 09/02/2011   Dyslipidemia associated with type 2 diabetes mellitus ([place]) 09/02/2011   Hypertension associated with diabetes ([place]) 09/02/2011    Past Medical History:  Diagnosis Date   Arthritis    Carpal tunnel syndrome    Chronic migraine 01/23/2019   Diabetes mellitus    Diastolic heart failure (HCC)    GERD (gastroesophageal reflux disease)    Hypertension    Obesity    Pulmonary embolism ([place]) 2013   He has a history of HTN.  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 A repeat scan done in 2017 did not demonstrate evidence of emboli. Neither study demonstrated any coronary calcifications.    Shoulder pain, bilateral    Sleep apnea    was fitted for mask and never got machine - too costly    Past Surgical History:  Procedure Laterality Date   CARPAL TUNNEL RELEASE Right 12/04/2015   Procedure: RIGHT CARPAL TUNNEL RELEASE;  Surgeon: Leanora Cover, MD;  Location: [place];  Service: Orthopedics;  Laterality: Right;  RIGHT CARPAL TUNNEL RELEASE   CARPAL TUNNEL RELEASE Right 12/2015   [place]   CARPAL TUNNEL RELEASE Left 04/01/2016   Procedure: LEFT CARPAL TUNNEL RELEASE;  Surgeon: Leanora Cover, MD;  Location: [place];  Service: Orthopedics;  Laterality: Left;   I & D EXTREMITY Left 02/19/2021   Procedure: LEFT FOOT SPUR REMOVAL;  Surgeon: Meredith Pel, MD;  Location: [place];  Service: Orthopedics;  Laterality: Left;   KNEE ARTHROSCOPY WITH MEDIAL MENISECTOMY Left 02/19/2021   Procedure: LEFT KNEE ARTHROSCOPY, MENISCAL CYST DECOMPRESSION;  Surgeon: Meredith Pel, MD;  Location: [place];  Service: Orthopedics;  Laterality: Left;   right hand surgery     ROTATOR CUFF REPAIR Right     Social History   Socioeconomic History   Marital status: Married    Spouse name: Not on file   Number of children: Not on file   Years of education: 12+   Highest education level: Not on file  Occupational History   Occupation: real estate agent  Tobacco Use   Smoking status: Never   Smokeless tobacco: Never  Vaping Use   Vaping Use: Never used  Substance and Sexual Activity   Alcohol use: No    Alcohol/week: 0.0 standard drinks of alcohol   Drug use: No   Sexual activity: Yes  Other Topics Concern   Not on file  Social History Narrative   Lives at home.   Right-handed.   Occasional caffeine use.   Tea every other day   Social Determinants of Health   Financial Resource Strain: Not on file  Food  Insecurity: Not on file  Transportation Needs: Not on file  Physical Activity: Not on file  Stress: Not on file  Social Connections: Not on file  Intimate Partner Violence: Not on file    Family History  Problem Relation Age of Onset   Hypertension Mother    Kidney disease Father    Hypertension Other    Hyperlipidemia Other    Diabetes Other      Review of Systems  Constitutional: Negative.  Negative for chills and fever.  HENT: Negative.  Negative for congestion and sore throat.   Respiratory: Negative.  Negative for cough and shortness of breath.   Cardiovascular: Negative.  Negative for chest pain and palpitations.  Gastrointestinal: Negative.  Negative for abdominal pain, diarrhea, nausea and vomiting.  Genitourinary: Negative.  Musculoskeletal: Negative.   Skin: Negative.  Negative for rash.  Neurological: Negative.  Negative for dizziness and headaches.  All other systems reviewed and are negative.   Today's Vitals   02/16/22 1030  BP: 128/74  Pulse: 85  Temp: 98.1 F (36.7 C)  TempSrc: Oral  SpO2: 93%  Weight: 226 lb (102.5 kg)  Height: 5\' 6"  (1.676 m)   Body mass index is 36.48 kg/m.  Physical Exam Vitals reviewed.  Constitutional:      Appearance: Normal appearance.  HENT:     Head: Normocephalic.     Mouth/Throat:     Mouth: Mucous membranes are moist.     Pharynx: Oropharynx is clear.  Eyes:     Extraocular Movements: Extraocular movements intact.     Conjunctiva/sclera: Conjunctivae normal.     Pupils: Pupils are equal, round, and reactive to light.  Cardiovascular:     Rate and Rhythm: Normal rate and regular rhythm.     Pulses: Normal pulses.     Heart sounds: Normal heart sounds.  Pulmonary:     Effort: Pulmonary effort is normal.     Breath sounds: Normal breath sounds.  Musculoskeletal:     Cervical back: No tenderness.  Lymphadenopathy:     Cervical: No cervical adenopathy.  Skin:    General: Skin is warm and dry.      Capillary Refill: Capillary refill takes less than 2 seconds.  Neurological:     General: No focal deficit present.     Mental Status: He is alert and oriented to person, place, and time.  Psychiatric:        Mood and Affect: Mood normal.        Behavior: Behavior normal.     Results for orders placed or performed in visit on 02/16/22 (from the past 24 hour(s))  POCT HgB A1C     Status: Abnormal   Collection Time: 02/16/22 10:46 AM  Result Value Ref Range   Hemoglobin A1C 6.9 (A) 4.0 - 5.6 %   HbA1c POC (<> result, manual entry)     HbA1c, POC (prediabetic range)     HbA1c, POC (controlled diabetic range)      ASSESSMENT & PLAN: A total of 45 minutes was spent with the patient and counseling/coordination of care regarding preparing for this visit, review of most recent office visit notes, review of most recent blood work results including interpretation of today's hemoglobin A1c, review of multiple chronic medical problems and their management, review of all medications, cardiovascular risks associated with hypertension and diabetes, education on nutrition, prognosis, documentation, and need for follow-up in 3 months.  Problem List Items Addressed This Visit       Cardiovascular and Mediastinum   Hypertension associated with diabetes (HCC) - Primary    Well-controlled hypertension. Continue lisinopril 20 mg and amlodipine 10 mg daily. Well-controlled diabetes with hemoglobin A1c of 6.9. Continue weekly Trulicity 1.5 mg, daily Jardiance 10 mg, and metformin 500 mg twice a day. Cardio vascular risks associated with hypertension and diabetes discussed. Diet and nutrition discussed. Follow-up in 3 months.      Relevant Medications   lisinopril (ZESTRIL) 20 MG tablet   empagliflozin (JARDIANCE) 10 MG TABS tablet     Respiratory   OSA (obstructive sleep apnea)    Stable.  On CPAP treatment.        Digestive   GERD (gastroesophageal reflux disease)    Stable.         Endocrine   Dyslipidemia  associated with type 2 diabetes mellitus (HCC)    Stable.  Diet and nutrition discussed. Continue atorvastatin 20 mg daily.       Relevant Medications   lisinopril (ZESTRIL) 20 MG tablet   empagliflozin (JARDIANCE) 10 MG TABS tablet   Other Visit Diagnoses     Type 2 diabetes mellitus with hyperglycemia, with long-term current use of insulin (HCC)       Relevant Medications   lisinopril (ZESTRIL) 20 MG tablet   empagliflozin (JARDIANCE) 10 MG TABS tablet   Other Relevant Orders   POCT HgB A1C (Completed)   Essential hypertension       Relevant Medications   lisinopril (ZESTRIL) 20 MG tablet      Patient Instructions  Diabetes Mellitus and Nutrition, Adult When you have diabetes, or diabetes mellitus, it is very important to have healthy eating habits because your blood sugar (glucose) levels are greatly affected by what you eat and drink. Eating healthy foods in the right amounts, at about the same times every day, can help you: Manage your blood glucose. Lower your risk of heart disease. Improve your blood pressure. Reach or maintain a healthy weight. What can affect my meal plan? Every person with diabetes is different, and each person has different needs for a meal plan. Your health care provider may recommend that you work with a dietitian to make a meal plan that is best for you. Your meal plan may vary depending on factors such as: The calories you need. The medicines you take. Your weight. Your blood glucose, blood pressure, and cholesterol levels. Your activity level. Other health conditions you have, such as heart or kidney disease. How do carbohydrates affect me? Carbohydrates, also called carbs, affect your blood glucose level more than any other type of food. Eating carbs raises the amount of glucose in your blood. It is important to know how many carbs you can safely have in each meal. This is different for every person. Your dietitian  can help you calculate how many carbs you should have at each meal and for each snack. How does alcohol affect me? Alcohol can cause a decrease in blood glucose (hypoglycemia), especially if you use insulin or take certain diabetes medicines by mouth. Hypoglycemia can be a life-threatening condition. Symptoms of hypoglycemia, such as sleepiness, dizziness, and confusion, are similar to symptoms of having too much alcohol. Do not drink alcohol if: Your health care provider tells you not to drink. You are pregnant, may be pregnant, or are planning to become pregnant. If you drink alcohol: Limit how much you have to: 0-1 drink a day for women. 0-2 drinks a day for men. Know how much alcohol is in your drink. In the [place] one drink equals one 12 oz bottle of beer (355 mL), one 5 oz glass of wine (148 mL), or one 1 oz glass of hard liquor (44 mL). Keep yourself hydrated with water, diet soda, or unsweetened iced tea. Keep in mind that regular soda, juice, and other mixers may contain a lot of sugar and must be counted as carbs. What are tips for following this plan?  Reading food labels Start by checking the serving size on the Nutrition Facts label of packaged foods and drinks. The number of calories and the amount of carbs, fats, and other nutrients listed on the label are based on one serving of the item. Many items contain more than one serving per package. Check the total grams (g) of carbs in one serving.  Check the number of grams of saturated fats and trans fats in one serving. Choose foods that have a low amount or none of these fats. Check the number of milligrams (mg) of salt (sodium) in one serving. Most people should limit total sodium intake to less than 2,300 mg per day. Always check the nutrition information of foods labeled as "low-fat" or "nonfat." These foods may be higher in added sugar or refined carbs and should be avoided. Talk to your dietitian to identify your daily goals for  nutrients listed on the label. Shopping Avoid buying canned, pre-made, or processed foods. These foods tend to be high in fat, sodium, and added sugar. Shop around the outside edge of the grocery store. This is where you will most often find fresh fruits and vegetables, bulk grains, fresh meats, and fresh dairy products. Cooking Use low-heat cooking methods, such as baking, instead of high-heat cooking methods, such as deep frying. Cook using healthy oils, such as olive, canola, or sunflower oil. Avoid cooking with butter, cream, or high-fat meats. Meal planning Eat meals and snacks regularly, preferably at the same times every day. Avoid going long periods of time without eating. Eat foods that are high in fiber, such as fresh fruits, vegetables, beans, and whole grains. Eat 4-6 oz (112-168 g) of lean protein each day, such as lean meat, chicken, fish, eggs, or tofu. One ounce (oz) (28 g) of lean protein is equal to: 1 oz (28 g) of meat, chicken, or fish. 1 egg.  cup (62 g) of tofu. Eat some foods each day that contain healthy fats, such as avocado, nuts, seeds, and fish. What foods should I eat? Fruits Berries. Apples. Oranges. Peaches. Apricots. Plums. Grapes. Mangoes. Papayas. Pomegranates. Kiwi. Cherries. Vegetables Leafy greens, including lettuce, spinach, kale, chard, collard greens, mustard greens, and cabbage. Beets. Cauliflower. Broccoli. Carrots. Green beans. Tomatoes. Peppers. Onions. Cucumbers. Brussels sprouts. Grains Whole grains, such as whole-wheat or whole-grain bread, crackers, tortillas, cereal, and pasta. Unsweetened oatmeal. Quinoa. Brown or wild rice. Meats and other proteins Seafood. Poultry without skin. Lean cuts of poultry and beef. Tofu. Nuts. Seeds. Dairy Low-fat or fat-free dairy products such as milk, yogurt, and cheese. The items listed above may not be a complete list of foods and beverages you can eat and drink. Contact a dietitian for more  information. What foods should I avoid? Fruits Fruits canned with syrup. Vegetables Canned vegetables. Frozen vegetables with butter or cream sauce. Grains Refined white flour and flour products such as bread, pasta, snack foods, and cereals. Avoid all processed foods. Meats and other proteins Fatty cuts of meat. Poultry with skin. Breaded or fried meats. Processed meat. Avoid saturated fats. Dairy Full-fat yogurt, cheese, or milk. Beverages Sweetened drinks, such as soda or iced tea. The items listed above may not be a complete list of foods and beverages you should avoid. Contact a dietitian for more information. Questions to ask a health care provider Do I need to meet with a certified diabetes care and education specialist? Do I need to meet with a dietitian? What number can I call if I have questions? When are the best times to check my blood glucose? Where to find more information: American Diabetes Association: diabetes.org Academy of Nutrition and Dietetics: eatright.[employer] of Diabetes and Digestive and Kidney Diseases: StageSync.si Association of Diabetes Care & Education Specialists: diabeteseducator.org Summary It is important to have healthy eating habits because your blood sugar (glucose) levels are greatly affected by what you eat  and drink. It is important to use alcohol carefully. A healthy meal plan will help you manage your blood glucose and lower your risk of heart disease. Your health care provider may recommend that you work with a dietitian to make a meal plan that is best for you. This information is not intended to replace advice given to you by your health care provider. Make sure you discuss any questions you have with your health care provider. Document Revised: 11/21/2019 Document Reviewed: 11/21/2019 Elsevier Patient Education  2023 Elsevier Inc.     Edwina BarthMiguel Maddox Hlavaty, MD [facility]

## 2022-02-16 NOTE — Assessment & Plan Note (Signed)
Well-controlled hypertension. Continue lisinopril 20 mg and amlodipine 10 mg daily. Well-controlled diabetes with hemoglobin A1c of 6.9. Continue weekly Trulicity 1.5 mg, daily Jardiance 10 mg, and metformin 500 mg twice a day. Cardio vascular risks associated with hypertension and diabetes discussed. Diet and nutrition discussed. Follow-up in 3 months.

## 2022-03-09 ENCOUNTER — Ambulatory Visit (INDEPENDENT_AMBULATORY_CARE_PROVIDER_SITE_OTHER): Payer: 59

## 2022-03-09 ENCOUNTER — Ambulatory Visit (INDEPENDENT_AMBULATORY_CARE_PROVIDER_SITE_OTHER): Payer: 59 | Admitting: Family Medicine

## 2022-03-09 VITALS — BP 126/84 | HR 91 | Ht 66.0 in | Wt 226.0 lb

## 2022-03-09 DIAGNOSIS — R0781 Pleurodynia: Secondary | ICD-10-CM | POA: Diagnosis not present

## 2022-03-09 MED ORDER — TIZANIDINE HCL 4 MG PO TABS: 4.0000 mg | ORAL_TABLET | Freq: Three times a day (TID) | ORAL | 1 refills | Status: DC | PRN

## 2022-03-09 NOTE — Progress Notes (Signed)
   I, Peterson Lombard, LAT, ATC acting as a scribe for [name], MD.  Scott [name] is a 51 y.o. male who presents to [facility] today for rib pain.  Patient was previously seen by Dr. Glennon Mac on 10/01/2021 for left hip pain.  Today, patient reports he hit his right side on his truck on Friday. Pt was cleaning his truck out and was trying to reach a spot in the center, and hit the hood of the truck. Patient locates pain to the right side of his rib cage.  Aggravates: deep inhale, side lying, sneezing, coughing Treatments tried: meloxicam, blue stop cream  Pertinent review of systems: No fevers or chills  Relevant historical information: Pulmonary embolism.  Heart failure.  LVH.   Exam:  BP 126/84   Pulse 91   Ht 5\' 6"  (1.676 m)   Wt 226 lb (102.5 kg)   SpO2 95%   BMI 36.48 kg/m  General: Well Developed, well nourished, and in no acute distress.   MSK: Tender palpation right anterior chest wall.  Normal chest expansion.    Lab and Radiology Results  X-ray images right ribs and chest obtained today personally and independently interpreted No clearly visible rib fracture is present although the area of pain overlaps the liver and is not clearly visible. No pneumothorax or pleural effusion is visible. Await formal radiology review.    Assessment and Plan: 51 y.o. male with right anterior inferior rib pain.  Pain thought to be mostly chest wall contusion but fracture is possible.  Await radiology overread.  Plan for rib belt deep inspiration Tylenol, existing prescription of meloxicam, and tizanidine at bedtime.  Check back in 1 month.  Precautions reviewed.   PDMP not reviewed this encounter. Orders Placed This Encounter  Procedures   DG Ribs Unilateral W/Chest Right    Standing Status:   Future    Number of Occurrences:   1    Standing Expiration Date:   04/08/2022    Order Specific Question:   Reason for Exam (SYMPTOM  OR DIAGNOSIS  REQUIRED)    Answer:   Rib pain    Order Specific Question:   Preferred imaging location?    Answer:   [facility]   Meds ordered this encounter  Medications   tiZANidine (ZANAFLEX) 4 MG tablet    Sig: Take 1 tablet (4 mg total) by mouth every 8 (eight) hours as needed for muscle spasms.    Dispense:  30 tablet    Refill:  1     Discussed warning signs or symptoms. Please see discharge instructions. Patient expresses understanding.   The above documentation has been reviewed and is accurate and complete [name], M.D.

## 2022-03-09 NOTE — Patient Instructions (Signed)
Thank you for coming in today.   Use the muscle relaxer at bedtime.   Recheck in 1 month.   Let me know if this is not working.   Rib belt could help some.   Bed assist ladder could help.   [facility] medical supply.   Rib Fracture  A rib fracture is a break or crack in one of the bones of the ribs. The ribs are long, curved bones that wrap around your chest and attach to your spine and your breastbone. The ribs protect your heart, lungs, and other organs in the chest. A broken or cracked rib is often painful but is not usually serious. Most rib fractures heal on their own over time. However, rib fractures can be more serious if multiple ribs are broken or if broken ribs move out of place and push against other structures or organs. What are the causes? This condition is caused by: Repetitive movements with high force, such as pitching a baseball or having very bad coughing spells. A direct hit the chest, such as a sports injury, a car crash, or a fall. Cancer that has spread to the bones, which can weaken bones and cause them to break. What are the signs or symptoms? Symptoms of this condition include: Pain when you breathe in or cough. Pain when someone presses on the injured area. Feeling short of breath. How is this diagnosed? This condition is diagnosed with a physical exam and medical history. You may also have imaging tests, such as: Chest X-ray. CT scan. MRI. Bone scan. Chest ultrasound. How is this treated? Treatment for this condition depends on the severity of the fracture. Most rib fractures usually heal on their own in 1-3 months. Healing may take longer if you have a cough or if you do activities that make the injury worse. While you heal, you may be given medicines to control the pain. You will also be taught deep breathing exercises. Severe injuries may require hospitalization or surgery. Follow these instructions at home: Managing pain, stiffness, and swelling If  directed, put ice on the injured area. To do this: Put ice in a plastic bag. Place a towel between your skin and the bag. Leave the ice on for 20 minutes, 2-3 times a day. Remove the ice if your skin turns bright red. This is very important. If you cannot feel pain, heat, or cold, you have a greater risk of damage to the area. Take over-the-counter and prescription medicines only as told by your health care provider. Activity Avoid doing activities or movements that cause pain. Be careful during activities and avoid bumping the injured rib. Slowly increase your activity as told by your health care provider. General instructions Do deep breathing exercises as told by your health care provider. This helps prevent pneumonia, which is a common complication of a broken rib. Your health care provider may instruct you to: Take deep breaths several times a day. Try to cough several times a day, holding a pillow against the injured area. Use a device called incentive spirometer to practice deep breathing several times a day. Drink enough fluid to keep your urine pale yellow. Do not wear a rib belt or binder. These restrict breathing, which can lead to pneumonia. Keep all follow-up visits. This is important. Contact a health care provider if: You have a fever. Get help right away if: You have difficulty breathing or you are short of breath. You develop a cough that does not stop, or you cough  up thick or bloody sputum. You have nausea, vomiting, or pain in your abdomen. Your pain gets worse and medicine does not help. These symptoms may represent a serious problem that is an emergency. Do not wait to see if the symptoms will go away. Get medical help right away. Call your local emergency services (911 in the [place] Do not drive yourself to the hospital. Summary A rib fracture is a break or crack in one of the bones of the ribs. A broken or cracked rib is often painful but is not usually  serious. Most rib fractures heal on their own over time. Treatment for this condition depends on the severity of the fracture. Avoid doing any activities or movements that cause pain. This information is not intended to replace advice given to you by your health care provider. Make sure you discuss any questions you have with your health care provider. Document Revised: 08/10/2019 Document Reviewed: 08/10/2019 Elsevier Patient Education  2023 [employer].

## 2022-03-10 ENCOUNTER — Ambulatory Visit: Payer: 59 | Admitting: Emergency Medicine

## 2022-03-11 ENCOUNTER — Ambulatory Visit: Payer: 59 | Admitting: Emergency Medicine

## 2022-03-12 NOTE — Progress Notes (Signed)
Radiology did not see any fractures on your rib x-ray.

## 2022-03-19 ENCOUNTER — Encounter: Payer: Self-pay | Admitting: Family Medicine

## 2022-03-19 ENCOUNTER — Ambulatory Visit (INDEPENDENT_AMBULATORY_CARE_PROVIDER_SITE_OTHER): Payer: 59 | Admitting: Family Medicine

## 2022-03-19 ENCOUNTER — Other Ambulatory Visit: Payer: Self-pay | Admitting: Emergency Medicine

## 2022-03-19 VITALS — BP 132/84 | HR 88 | Temp 97.9°F | Ht 66.0 in | Wt 229.0 lb

## 2022-03-19 DIAGNOSIS — J019 Acute sinusitis, unspecified: Secondary | ICD-10-CM

## 2022-03-19 DIAGNOSIS — I10 Essential (primary) hypertension: Secondary | ICD-10-CM

## 2022-03-19 MED ORDER — AMOXICILLIN-POT CLAVULANATE 875-125 MG PO TABS: 1.0000 | ORAL_TABLET | Freq: Two times a day (BID) | ORAL | 0 refills | Status: DC

## 2022-03-19 NOTE — Progress Notes (Signed)
Subjective:  Scott [name] is a 51 y.o. male who presents for an 11 day hx of nasal congestion, sinus pressure and ear fullness. Mild cough.   Does not smoke. No recent antibiotics.   Denies fever, chills, dizziness, chest pain, palpitations, shortness of breath, abdominal pain, N/V/D, urinary symptoms, LE edema.    Treatment to date:  nasocort and Afrin .    No other aggravating or relieving factors.  No other c/o.  ROS as in subjective.   Objective: Vitals:   03/19/22 1310 03/19/22 1326  BP: 132/84   Pulse: (!) 102 88  Temp: 97.9 F (36.6 C)   SpO2: 98%     General appearance: Alert, WD/WN, no distress, well appearing                             Skin: warm, no rash                           Head: no sinus tenderness                            Eyes: conjunctiva normal, corneas clear, PERRLA                            Ears: pearly TMs, external ear canals normal                          Nose: septum midline, turbinates swollen, with erythema              Mouth/throat: MMM, tongue normal, mild pharyngeal erythema                           Neck: supple, no adenopathy, no thyromegaly, nontender                          Heart: RRR                         Lungs: CTA bilaterally, no wheezes, rales, or rhonchi      Assessment: Acute sinusitis with symptoms > 10 days - Plan: amoxicillin-clavulanate (AUGMENTIN) 875-125 MG tablet   Plan: Augmentin prescribed.   Suggested symptomatic OTC remedies. Nasal saline spray and Flonase for congestion.  Tylenol or Ibuprofen OTC prn.   Call/return in 4-5 days if symptoms aren't resolving.

## 2022-03-19 NOTE — Patient Instructions (Signed)
Take the antibiotic as prescribed.  Stay well-hydrated.  Continue with a steroid nasal spray such as Flonase or Nasacort.  Try Mucinex.  Tylenol or ibuprofen as needed.  Follow-up if not back to baseline when you complete the antibiotic or if you are not seeing any improvement in the next 4 to 5 days.

## 2022-03-22 ENCOUNTER — Telehealth: Payer: Self-pay | Admitting: Emergency Medicine

## 2022-03-22 DIAGNOSIS — Z794 Long term (current) use of insulin: Secondary | ICD-10-CM

## 2022-03-24 NOTE — Telephone Encounter (Signed)
Called patient to confirm Jardiance dose

## 2022-03-24 NOTE — Telephone Encounter (Signed)
Patient called to follow up on Jardiance and said the pharmacy can only fill the 1omg because the 25mg  script had ran out. He wants to know which strength he needs to be on. Call back is [phone]

## 2022-04-05 NOTE — Progress Notes (Unsigned)
   I, Philbert Riser, LAT, ATC acting as a scribe for [name], MD.  Scott [name] is a 51 y.o. male who presents to [facility] at [facility] today for follow-up right-sided rib pain.  Pt was cleaning his truck out and was trying to reach a spot in the center, and hit the hood of the truck. Patient was last seen by Dr. Denyse Amass on 03/09/2022 and was advised to wear a rib belt, do deep inspirations, Tylenol, meloxicam, and tizanidine at bedtime.  Today, patient reports  Dx imaging: 03/09/22 R chest w/ ribs XR  Pertinent review of systems: ***  Relevant historical information: ***   Exam:  There were no vitals taken for this visit. General: Well Developed, well nourished, and in no acute distress.   MSK: ***    Lab and Radiology Results No results found for this or any previous visit (from the past 72 hour(s)). No results found.     Assessment and Plan: 51 y.o. male with ***   PDMP not reviewed this encounter. No orders of the defined types were placed in this encounter.  No orders of the defined types were placed in this encounter.    Discussed warning signs or symptoms. Please see discharge instructions. Patient expresses understanding.   ***

## 2022-04-06 ENCOUNTER — Ambulatory Visit (INDEPENDENT_AMBULATORY_CARE_PROVIDER_SITE_OTHER): Payer: 59 | Admitting: Family Medicine

## 2022-04-06 VITALS — BP 140/88 | HR 91 | Ht 66.0 in | Wt 230.0 lb

## 2022-04-06 DIAGNOSIS — R0781 Pleurodynia: Secondary | ICD-10-CM

## 2022-04-06 NOTE — Patient Instructions (Addendum)
Thank you for coming in today.   Recheck as needed.   Continue to advance activity as tolerated.   Wean off the muscle relaxer in the future.

## 2022-04-21 ENCOUNTER — Ambulatory Visit (INDEPENDENT_AMBULATORY_CARE_PROVIDER_SITE_OTHER): Payer: 59 | Admitting: Orthopedic Surgery

## 2022-04-21 DIAGNOSIS — M1712 Unilateral primary osteoarthritis, left knee: Secondary | ICD-10-CM

## 2022-04-22 ENCOUNTER — Encounter: Payer: Self-pay | Admitting: Orthopedic Surgery

## 2022-04-22 NOTE — Progress Notes (Signed)
Office Visit Note   [name]           Date of Birth: [birth year]           MRN: [id number] Visit Date: 04/21/2022 Requested by: Georgina Quint, MD [address] [place],  [place] [address] PCP: Georgina Quint, MD  Subjective: Chief Complaint  Patient presents with   Left Knee - Pain    HPI: Scott [name] is a 51 y.o. male who presents to the office reporting left knee pain.  He would like to have an injection today.  He did have cortisone injection 12/17/2021 cortisone injection 11/11/2021.  Gel injection 09/07/2021 which did not help.  Cortisone injection 06/19/2021 and Toradol injection 05/06/2021.  He underwent left knee arthroscopy with meniscal cyst decompression on 02/19/2021.  Arthritis was noted at that time.  He has been walking and has a new bike that he has been riding..                ROS: All systems reviewed are negative as they relate to the chief complaint within the history of present illness.  Patient denies fevers or chills.  Assessment & Plan: Visit Diagnoses:  1. Unilateral primary osteoarthritis, left knee     Plan: Impression is left knee pain.  Has had cortisone injections x 3 this year already so would not want to do any more cortisone.  Toradol is injected today.  We will restart the counter next year.  Continue with nonweightbearing quadrant getting exercises Follow-Up Instructions: No follow-ups on file.   Orders:  No orders of the defined types were placed in this encounter.  No orders of the defined types were placed in this encounter.     Procedures: Large Joint Inj: L knee on 04/21/2022 9:01 AM Indications: diagnostic evaluation, joint swelling and pain Details: 18 G 1.5 in needle, superolateral approach  Arthrogram: No  Medications: 5 mL lidocaine 1 %; 4 mL bupivacaine 0.25 % Outcome: tolerated well, no immediate complications  Toradol injected 1 cc Procedure, treatment alternatives, risks and benefits  explained, specific risks discussed. Consent was given by the patient. Immediately prior to procedure a time out was called to verify the correct patient, procedure, equipment, support staff and site/side marked as required. Patient was prepped and draped in the usual sterile fashion.       Clinical Data: No additional findings.  Objective: Vital Signs: There were no vitals taken for this visit.  Physical Exam:  Constitutional: Patient appears well-developed HEENT:  Head: Normocephalic Eyes:EOM are normal Neck: Normal range of motion Cardiovascular: Normal rate Pulmonary/chest: Effort normal Neurologic: Patient is alert Skin: Skin is warm Psychiatric: Patient has normal mood and affect  Ortho Exam: Ortho exam demonstrates trace effusion in the left knee.  Has improving quad strength.  Normal gait.  Medial greater than lateral joint line tenderness.  No Baker's cyst palpable.  Ankle dorsiflexion plantarflexion intact.  Specialty Comments:  MRI LUMBAR SPINE WITHOUT CONTRAST     TECHNIQUE:  Multiplanar, multisequence MR imaging of the lumbar spine was  performed. No intravenous contrast was administered.     COMPARISON:  Prior radiograph from 01/16/2020 and MRI from  02/05/2017.     FINDINGS:  Segmentation:  Standard.     Alignment:  Physiologic.  No listhesis.     Vertebrae: Vertebral body height maintained without acute or chronic  fracture. Bone marrow signal intensity within normal limits. No  discrete or worrisome osseous lesions. Mild reactive  marrow edema  noted about the right greater than left L4-5 facets due to facet  arthritis. No other abnormal marrow edema.     Conus medullaris and cauda equina: Conus extends to the L1 level.  Conus and cauda equina appear normal.     Paraspinal and other soft tissues: Unremarkable.     Disc levels:     L1-2:  Unremarkable.     L2-3:  Unremarkable.     L3-4: Normal interspace. Minimal facet spurring. No canal or   foraminal stenosis. No impingement.     L4-5: Disc desiccation. Shallow broad-based left subarticular disc  protrusion indents the left ventral thecal sac, encroaching upon the  left lateral recess. Associated central annular fissure. Moderate  bilateral facet hypertrophy, mildly progressed from previous.  Resultant mild canal with moderate left lateral recess stenosis,  descending L5 nerve root level. Mild to moderate bilateral L4  foraminal narrowing. Appearance is overall relatively similar to  previous.     L5-S1: Normal interspace. Moderate right with mild left facet  hypertrophy, mildly progressed from previous. No stenosis or  impingement.     IMPRESSION:  1. Shallow left subarticular disc protrusion at L4-5 with resultant  mild canal and moderate left lateral recess stenosis, potentially  affecting the descending left L5 nerve root.  2. Moderate bilateral facet hypertrophy at L4-5 and L5-S1, mildly  progressed from previous, and could contribute to lower back pain.        Electronically Signed    By: Rise Mu M.D.    On: 01/27/2020 01:46  Imaging: No results found.   PMFS History: Patient Active Problem List   Diagnosis Date Noted   GERD (gastroesophageal reflux disease) 03/31/2021   History of colonic polyps 11/21/2020   Posterior calcaneal exostosis 07/21/2020   Trigger thumb of right hand 07/03/2020   Hyperlipidemia 04/23/2020   Class 2 severe obesity due to excess calories with serious comorbidity and body mass index (BMI) of 37.0 to 37.9 in adult [facility]) 02/13/2019   Sleep apnea 01/23/2019   Arthritis 01/23/2019   Chronic diastolic heart failure (HCC) 11/07/2016   OSA (obstructive sleep apnea) 04/18/2012   Left ventricular diastolic dysfunction, NYHA class 1/moderate LVH with concentric hypertrophy 09/03/2011   ? DVT of popliteal vein, right (non occlusive) 09/03/2011   Pulmonary embolism (HCC) 09/02/2011   Dyslipidemia associated with type 2  diabetes mellitus (HCC) 09/02/2011   Hypertension associated with diabetes (HCC) 09/02/2011   Past Medical History:  Diagnosis Date   Arthritis    Carpal tunnel syndrome    Chronic migraine 01/23/2019   Diabetes mellitus    Diastolic heart failure (HCC)    GERD (gastroesophageal reflux disease)    Hypertension    Obesity    Pulmonary embolism (HCC) 2013   He has a history of HTN.  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 A repeat scan done in 2017 did not demonstrate evidence of emboli. Neither study demonstrated any coronary calcifications.    Shoulder pain, bilateral    Sleep apnea    was fitted for mask and never got machine - too costly    Family History  Problem Relation Age of Onset   Hypertension Mother    Kidney disease Father    Hypertension Other    Hyperlipidemia Other    Diabetes Other     Past Surgical History:  Procedure Laterality Date   CARPAL TUNNEL RELEASE Right 12/04/2015   Procedure: RIGHT CARPAL TUNNEL RELEASE;  Surgeon: Betha Loa, MD;  Location: [facility];  Service: Orthopedics;  Laterality: Right;  RIGHT CARPAL TUNNEL RELEASE   CARPAL TUNNEL RELEASE Right 12/2015   [facility]   CARPAL TUNNEL RELEASE Left 04/01/2016   Procedure: LEFT CARPAL TUNNEL RELEASE;  Surgeon: Betha Loa, MD;  Location: [facility];  Service: Orthopedics;  Laterality: Left;   I & D EXTREMITY Left 02/19/2021   Procedure: LEFT FOOT SPUR REMOVAL;  Surgeon: Cammy Copa, MD;  Location: [facility] OR;  Service: Orthopedics;  Laterality: Left;   KNEE ARTHROSCOPY WITH MEDIAL MENISECTOMY Left 02/19/2021   Procedure: LEFT KNEE ARTHROSCOPY, MENISCAL CYST DECOMPRESSION;  Surgeon: Cammy Copa, MD;  Location: [facility] OR;  Service: Orthopedics;  Laterality: Left;   right hand surgery     ROTATOR CUFF REPAIR Right    Social History   Occupational History   Occupation: real estate agent  Tobacco Use   Smoking status: Never    Smokeless tobacco: Never  Vaping Use   Vaping Use: Never used  Substance and Sexual Activity   Alcohol use: No    Alcohol/week: 0.0 standard drinks of alcohol   Drug use: No   Sexual activity: Yes

## 2022-04-25 MED ORDER — LIDOCAINE HCL 1 % IJ SOLN
5.0000 mL | INTRAMUSCULAR | Status: AC | PRN
  Administered 2022-04-21: 5 mL

## 2022-04-25 MED ORDER — BUPIVACAINE HCL 0.25 % IJ SOLN
4.0000 mL | INTRAMUSCULAR | Status: AC | PRN
  Administered 2022-04-21: 4 mL via INTRA_ARTICULAR

## 2022-05-19 ENCOUNTER — Ambulatory Visit: Payer: 59 | Admitting: Emergency Medicine

## 2022-06-04 ENCOUNTER — Telehealth: Payer: Self-pay | Admitting: Physical Medicine and Rehabilitation

## 2022-06-04 NOTE — Telephone Encounter (Signed)
Patient called. He would like an appointment with Dr. Newton.  

## 2022-06-07 ENCOUNTER — Other Ambulatory Visit: Payer: Self-pay | Admitting: Physical Medicine and Rehabilitation

## 2022-06-07 ENCOUNTER — Telehealth: Payer: Self-pay | Admitting: Physical Medicine and Rehabilitation

## 2022-06-07 DIAGNOSIS — M545 Other chronic pain: Secondary | ICD-10-CM

## 2022-06-07 DIAGNOSIS — M47816 Spondylosis without myelopathy or radiculopathy, lumbar region: Secondary | ICD-10-CM

## 2022-06-07 NOTE — Telephone Encounter (Signed)
LVM to return call to discuss appointment

## 2022-06-07 NOTE — Telephone Encounter (Signed)
Spoke with patient and he stated his back pain has returned. He is requesting another ablation. Last ablation was 08/2021. No new falls, injuries or accidents. Please advise

## 2022-06-07 NOTE — Telephone Encounter (Signed)
See previous encounter

## 2022-06-07 NOTE — Progress Notes (Signed)
Previous bilateral L4-L5 radiofrequency ablation performed in May of 2023 provided greater then 80% relief for 8 months. I placed order for repeat RFA.

## 2022-06-07 NOTE — Telephone Encounter (Signed)
Patient returned call asked for a call back as soon as possible. The number to contact patient is [phone]

## 2022-06-08 ENCOUNTER — Encounter: Payer: Self-pay | Admitting: Family Medicine

## 2022-06-08 ENCOUNTER — Ambulatory Visit (INDEPENDENT_AMBULATORY_CARE_PROVIDER_SITE_OTHER): Payer: 59 | Admitting: Family Medicine

## 2022-06-08 ENCOUNTER — Ambulatory Visit (INDEPENDENT_AMBULATORY_CARE_PROVIDER_SITE_OTHER): Payer: 59

## 2022-06-08 VITALS — BP 122/82 | HR 95 | Ht 66.0 in | Wt 228.0 lb

## 2022-06-08 DIAGNOSIS — M7711 Lateral epicondylitis, right elbow: Secondary | ICD-10-CM

## 2022-06-08 DIAGNOSIS — M25551 Pain in right hip: Secondary | ICD-10-CM | POA: Diagnosis not present

## 2022-06-08 DIAGNOSIS — M25512 Pain in left shoulder: Secondary | ICD-10-CM

## 2022-06-08 NOTE — Patient Instructions (Addendum)
Thank you for coming in today.   I've referred you to Physical Therapy.  Let us know if you don't hear from them in one week.   Check back in 6 weeks. 

## 2022-06-08 NOTE — Progress Notes (Signed)
I, Scott [name], CMA acting as a scribe for Scott Leader, MD.  Scott [name] is a 52 y.o. male who presents to [place] at [facility] today for left shoulder pain.  Patient was previously seen by Dr. Georgina Snell on 04/06/2022 for right-sided rib pain.  Today, patient reports left shoulder pain. Patient locates pain to anterior and lateral aspects of the shoulder. C/O aching in the upper arm. Sx worse with side-lying.  Neck pain: no Radiates: upper arm UE Numbness/tingling:occasionally UE Weakness: minimal, 15-20% per pt report.  Aggravates: sleeping, side-lying, lateral raises.  Treatments tried: Advil, topical analgesic.  Also c/o RIGHT hip pain, in the lateral aspect of the hip near the lateral part of the iliac crest sx worse with ambulation, prolonged sitting, and ascending stairs.   Additionally he notes pain at the right lateral elbow.  He has a history of lateral epicondylitis and has had injections in the past most recently about a year and a half ago.  The pain is slowly returning present in the lateral elbow worse with grip activities.   Pertinent review of systems: No fevers or chills  Relevant historical information: Diabetes   Exam:  BP 122/82   Pulse 95   Ht 5\' 6"  (1.676 m)   Wt 228 lb (103.4 kg)   SpO2 97%   BMI 36.80 kg/m  General: Well Developed, well nourished, and in no acute distress.   MSK: Left shoulder: Normal-appearing Nontender to palpation. Normal elbow motion some pain with abduction. Strength is intact. Positive Hawkins and Neer's test. Positive empty can test. Negative Yergason's and speeds test.  Right hip: Normal-appearing Normal hip motion. Tender palpation lateral iliac crest. Intact strength.  Right elbow: Normal-appearing Normal motion and intact strength. Tender palpation lateral epicondyle.  Pain with resisted wrist and finger extension present.    Lab and Radiology Results  Diagnostic Limited MSK  Ultrasound of: Left shoulder Image quality limited by body habitus. Biceps tendon intact normal-appearing Subscapularis tendon is intact. Supraspinatus tendon is intact appearing with no retracted rotator cuff tear. Mild to moderate subacromial bursitis is present. Infraspinatus tendon is intact. AC joint degenerative appearing Impression: Subacromial bursitis    Assessment and Plan: 52 y.o. male with left shoulder pain primary issue.  Pain thought to be due to subacromial bursitis and impingement.  He is a good candidate for trial of physical therapy.  Plan to refer to PT.  Recheck in about 6 weeks.  Additionally he notes left lateral hip pain thought to be due to hip abductor tendinopathy especially at its origin on the iliac crest.  Again physical therapy referral.  Lastly pain in the lateral elbow thought to be due to lateral epicondylitis.  Plan for PT and home exercise program taught in clinic today.   PDMP not reviewed this encounter. Orders Placed This Encounter  Procedures   [place] LIMITED JOINT SPACE STRUCTURES UP LEFT    Order Specific Question:   Reason for Exam (SYMPTOM  OR DIAGNOSIS REQUIRED)    Answer:   left shoulder/arm pain    Order Specific Question:   Preferred imaging location?    Answer:   [place]   Ambulatory referral to Physical Therapy    Referral Priority:   Routine    Referral Type:   Physical Medicine    Referral Reason:   Specialty Services Required    Requested Specialty:   Physical Therapy    Number of Visits Requested:   1  No orders of the defined types were placed in this encounter.    Discussed warning signs or symptoms. Please see discharge instructions. Patient expresses understanding.   The above documentation has been reviewed and is accurate and complete Scott [name], M.D.

## 2022-06-08 NOTE — Telephone Encounter (Signed)
LVM to inform patient that RFA referral was put in and will call back to schedule appointments

## 2022-06-14 ENCOUNTER — Ambulatory Visit (INDEPENDENT_AMBULATORY_CARE_PROVIDER_SITE_OTHER): Payer: 59

## 2022-06-14 ENCOUNTER — Telehealth: Payer: Self-pay | Admitting: Emergency Medicine

## 2022-06-14 ENCOUNTER — Encounter: Payer: Self-pay | Admitting: Emergency Medicine

## 2022-06-14 ENCOUNTER — Ambulatory Visit (INDEPENDENT_AMBULATORY_CARE_PROVIDER_SITE_OTHER): Payer: 59 | Admitting: Emergency Medicine

## 2022-06-14 VITALS — BP 118/84 | HR 86 | Temp 98.1°F | Ht 66.0 in | Wt 232.2 lb

## 2022-06-14 DIAGNOSIS — Z6837 Body mass index (BMI) 37.0-37.9, adult: Secondary | ICD-10-CM

## 2022-06-14 DIAGNOSIS — E785 Hyperlipidemia, unspecified: Secondary | ICD-10-CM

## 2022-06-14 DIAGNOSIS — E1159 Type 2 diabetes mellitus with other circulatory complications: Secondary | ICD-10-CM | POA: Diagnosis not present

## 2022-06-14 DIAGNOSIS — G4733 Obstructive sleep apnea (adult) (pediatric): Secondary | ICD-10-CM | POA: Diagnosis not present

## 2022-06-14 DIAGNOSIS — M25512 Pain in left shoulder: Secondary | ICD-10-CM

## 2022-06-14 DIAGNOSIS — I152 Hypertension secondary to endocrine disorders: Secondary | ICD-10-CM

## 2022-06-14 DIAGNOSIS — E1169 Type 2 diabetes mellitus with other specified complication: Secondary | ICD-10-CM

## 2022-06-14 DIAGNOSIS — I5032 Chronic diastolic (congestive) heart failure: Secondary | ICD-10-CM | POA: Diagnosis not present

## 2022-06-14 DIAGNOSIS — K219 Gastro-esophageal reflux disease without esophagitis: Secondary | ICD-10-CM | POA: Diagnosis not present

## 2022-06-14 LAB — POCT GLYCOSYLATED HEMOGLOBIN (HGB A1C): Hemoglobin A1C: 7 % — AB (ref 4.0–5.6)

## 2022-06-14 NOTE — Assessment & Plan Note (Signed)
No clinical signs of acute systolic heart failure. Stable.  Well-controlled hypertension.  Normovolemic. Wt Readings from Last 3 Encounters:  06/14/22 232 lb 4 oz (105.3 kg)  06/08/22 228 lb (103.4 kg)  04/06/22 230 lb (104.3 kg)

## 2022-06-14 NOTE — Assessment & Plan Note (Signed)
Stable and asymptomatic  

## 2022-06-14 NOTE — Patient Instructions (Signed)
Shoulder Pain Many things can cause shoulder pain, including: An injury. Moving the shoulder in the same way again and again (overuse). Joint pain (arthritis). Pain can come from: Swelling and irritation (inflammation) of any part of the shoulder. An injury to the shoulder joint. An injury to: Tissues that connect muscle to bone (tendons). Tissues that connect bones to each other (ligaments). Bones. Follow these instructions at home: Watch for changes in your symptoms. Let your doctor know about them. Follow these instructions to help with your pain. If you have a sling: Wear the sling as told by your doctor. Remove it only as told by your doctor. Loosen the sling if your fingers: Tingle. Become numb. Turn cold and blue. Keep the sling clean. If the sling is not waterproof: Do not let it get wet. Take the sling off when you shower or bathe. Managing pain, stiffness, and swelling  If told, put ice on the painful area: Put ice in a plastic bag. Place a towel between your skin and the bag. Leave the ice on for 20 minutes, 2-3 times a day. Stop putting ice on if it does not help with the pain. Squeeze a soft ball or a foam pad as much as possible. This prevents swelling in the shoulder. It also helps to strengthen the arm. General instructions Take over-the-counter and prescription medicines only as told by your doctor. Keep all follow-up visits as told by your doctor. This is important. Contact a doctor if: Your pain gets worse. Medicine does not help your pain. You have new pain in your arm, hand, or fingers. Get help right away if: Your arm, hand, or fingers: Tingle. Are numb. Are swollen. Are painful. Turn white or blue. Summary Shoulder pain can be caused by many things. These include injury, moving the shoulder in the same away again and again, and joint pain. Watch for changes in your symptoms. Let your doctor know about them. This condition may be treated with a  sling, ice, and pain medicine. Contact your doctor if the pain gets worse or you have new pain. Get help right away if your arm, hand, or fingers tingle or get numb, swollen, or painful. Keep all follow-up visits as told by your doctor. This is important. This information is not intended to replace advice given to you by your health care provider. Make sure you discuss any questions you have with your health care provider. Document Revised: 01/02/2021 Document Reviewed: 01/02/2021 Elsevier Patient Education  [employer].

## 2022-06-14 NOTE — Telephone Encounter (Signed)
Patient said he would like to get standard labs done since he hasn't had them in a while. Callback number is [phone].

## 2022-06-14 NOTE — Assessment & Plan Note (Addendum)
Of 3-week duration.  Affecting quality of life. X-ray done today.  Mild degenerative changes noted. Pain management discussed.  Recommend Tylenol as needed for pain May need to follow-up with orthopedist May need MRI of left shoulder if not better or worse during the next several weeks. Recently saw sports medicine for this problem.

## 2022-06-14 NOTE — Telephone Encounter (Signed)
I will take care of that.  Thanks.

## 2022-06-14 NOTE — Assessment & Plan Note (Signed)
Stable.  Diet and nutrition discussed. Continue atorvastatin 20 mg daily. Lipid profile done today.

## 2022-06-14 NOTE — Progress Notes (Signed)
Scott [name] 52 y.o.   Chief Complaint  Patient presents with   Follow-up    F/u appt DM, patient states the is having some left shoulder pain , seen another provider had U/S done, poss Bursitis, patient wonder if a MRI will tell him what's wrong with it      HISTORY OF PRESENT ILLNESS: This is a 52 y.o. male here for 21-monthfollow-up of diabetes and hypertension. Also complaining of 2 to 3-week history of pain to left shoulder.  Seen by sports medicine and diagnosed with bursitis. No other complaints or medical concerns today.  Left shoulder Lab Results  Component Value Date   HGBA1C 6.9 (A) 02/16/2022   Wt Readings from Last 3 Encounters:  06/14/22 232 lb 4 oz (105.3 kg)  06/08/22 228 lb (103.4 kg)  04/06/22 230 lb (104.3 kg)   BP Readings from Last 3 Encounters:  06/14/22 118/84  06/08/22 122/82  04/06/22 (!) 140/88     HPI   Prior to Admission medications   Medication Sig Start Date End Date Taking? Authorizing Provider  amLODipine (NORVASC) 10 MG tablet TAKE ONE TABLET BY MOUTH DAILY 03/19/22  Yes Joyce Leckey, MInes Bloomer MD  atorvastatin (LIPITOR) 20 MG tablet Take 1 tablet (20 mg total) by mouth daily. 09/08/21 09/03/22 Yes Ashvik Grundman, MInes Bloomer MD  fluticasone ([facility] 50 MCG/ACT nasal spray Place 2 sprays into both nostrils daily. Patient taking differently: Place 2 sprays into both nostrils daily as needed for allergies. 10/08/20  Yes Covington,Judson RochM, PA-C  glucose blood test strip Use as instructed. Inject into the skin once daily. E11.65 11/05/20  Yes Mayers, Cari S, PA-C  JARDIANCE 25 MG TABS tablet TAKE ONE TABLET BY MOUTH DAILY 03/22/22  Yes Ryin Ambrosius, MInes Bloomer MD  lisinopril (ZESTRIL) 20 MG tablet Take 1 tablet (20 mg total) by mouth daily. 02/16/22 02/11/23 Yes Semaj Kham, MInes Bloomer MD  loratadine (CLARITIN) 10 MG tablet Take 10 mg by mouth daily as needed for allergies.   Yes [provider]  metFORMIN (GLUCOPHAGE) 500 MG tablet Take 1  tablet (500 mg total) by mouth 2 (two) times daily with a meal. 11/16/21 11/11/22 Yes Adham Johnson, MInes Bloomer MD  Multiple Vitamin (MULTIVITAMIN WITH MINERALS) TABS tablet Take 1 tablet by mouth daily.   Yes [provider]  terbinafine (LAMISIL) 250 MG tablet Take 1 tablet (250 mg total) by mouth daily. 12/03/21  Yes WTrula Slade DPM  tiZANidine (ZANAFLEX) 4 MG tablet Take 1 tablet (4 mg total) by mouth every 8 (eight) hours as needed for muscle spasms. 03/09/22  Yes CGregor Hams MD  trolamine salicylate (BLUE-EMU HEMP) 10 % cream Apply 1 application topically as needed for muscle pain.   Yes [provider]  TRULICITY 1.5 M0000000SOPN INJECT 1.5 MG UNDER THE SKIN ONCE WEEKLY 02/13/22  Yes Corky Blumstein, MInes Bloomer MD  zinc gluconate 50 MG tablet Take 50 mg by mouth daily.   Yes [provider]    Allergies  Allergen Reactions   Pollen Extract Other (See Comments)    Patient Active Problem List   Diagnosis Date Noted   GERD (gastroesophageal reflux disease) 03/31/2021   History of colonic polyps 11/21/2020   Posterior calcaneal exostosis 07/21/2020   Trigger thumb of right hand 07/03/2020   Hyperlipidemia 04/23/2020   Class 2 severe obesity due to excess calories with serious comorbidity and body mass index (BMI) of 37.0 to 37.9 in adult ([facility] 02/13/2019   Sleep apnea 01/23/2019  Arthritis 01/23/2019   Chronic diastolic heart failure ([place]) 11/07/2016   OSA (obstructive sleep apnea) 04/18/2012   Left ventricular diastolic dysfunction, NYHA class 1/moderate LVH with concentric hypertrophy 09/03/2011   ? DVT of popliteal vein, right (non occlusive) 09/03/2011   Pulmonary embolism ([place]) 09/02/2011   Dyslipidemia associated with type 2 diabetes mellitus ([place]) 09/02/2011   Hypertension associated with diabetes ([place]) 09/02/2011    Past Medical History:  Diagnosis Date   Arthritis    Carpal tunnel syndrome    Chronic migraine 01/23/2019   Diabetes mellitus     Diastolic heart failure (HCC)    GERD (gastroesophageal reflux disease)    Hypertension    Obesity    Pulmonary embolism ([place]) 2013   He has a history of HTN.  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 A repeat scan done in 2017 did not demonstrate evidence of emboli. Neither study demonstrated any coronary calcifications.    Shoulder pain, bilateral    Sleep apnea    was fitted for mask and never got machine - too costly    Past Surgical History:  Procedure Laterality Date   CARPAL TUNNEL RELEASE Right 12/04/2015   Procedure: RIGHT CARPAL TUNNEL RELEASE;  Surgeon: Leanora Cover, MD;  Location: [place];  Service: Orthopedics;  Laterality: Right;  RIGHT CARPAL TUNNEL RELEASE   CARPAL TUNNEL RELEASE Right 12/2015   [place]   CARPAL TUNNEL RELEASE Left 04/01/2016   Procedure: LEFT CARPAL TUNNEL RELEASE;  Surgeon: Leanora Cover, MD;  Location: [place];  Service: Orthopedics;  Laterality: Left;   I & D EXTREMITY Left 02/19/2021   Procedure: LEFT FOOT SPUR REMOVAL;  Surgeon: Meredith Pel, MD;  Location: [place];  Service: Orthopedics;  Laterality: Left;   KNEE ARTHROSCOPY WITH MEDIAL MENISECTOMY Left 02/19/2021   Procedure: LEFT KNEE ARTHROSCOPY, MENISCAL CYST DECOMPRESSION;  Surgeon: Meredith Pel, MD;  Location: [place];  Service: Orthopedics;  Laterality: Left;   right hand surgery     ROTATOR CUFF REPAIR Right     Social History   Socioeconomic History   Marital status: Married    Spouse name: Not on file   Number of children: Not on file   Years of education: 12+   Highest education level: Not on file  Occupational History   Occupation: real estate agent  Tobacco Use   Smoking status: Never   Smokeless tobacco: Never  Vaping Use   Vaping Use: Never used  Substance and Sexual Activity   Alcohol use: No    Alcohol/week: 0.0 standard drinks of alcohol   Drug use: No   Sexual activity: Yes  Other Topics  Concern   Not on file  Social History Narrative   Lives at home.   Right-handed.   Occasional caffeine use.   Tea every other day   Social Determinants of Health   Financial Resource Strain: Not on file  Food Insecurity: Not on file  Transportation Needs: Not on file  Physical Activity: Not on file  Stress: Not on file  Social Connections: Not on file  Intimate Partner Violence: Not on file    Family History  Problem Relation Age of Onset   Hypertension Mother    Kidney disease Father    Hypertension Other    Hyperlipidemia Other    Diabetes Other      Review of Systems  Constitutional: Negative.  Negative for chills and fever.  HENT: Negative.  Negative  for congestion and sore throat.   Respiratory: Negative.  Negative for cough and shortness of breath.   Cardiovascular: Negative.  Negative for chest pain and palpitations.  Gastrointestinal:  Negative for abdominal pain, nausea and vomiting.  Genitourinary: Negative.  Negative for dysuria.  Musculoskeletal:  Positive for joint pain (Left shoulder).  Skin: Negative.  Negative for rash.  Neurological:  Negative for dizziness and headaches.  All other systems reviewed and are negative.  Today's Vitals   06/14/22 1442  BP: 118/84  Pulse: 86  Temp: 98.1 F (36.7 C)  TempSrc: Oral  SpO2: 95%  Weight: 232 lb 4 oz (105.3 kg)  Height: 5' 6"$  (1.676 m)   Body mass index is 37.49 kg/m.   Physical Exam Vitals reviewed.  Constitutional:      Appearance: Normal appearance.  HENT:     Head: Normocephalic.  Eyes:     Extraocular Movements: Extraocular movements intact.     Pupils: Pupils are equal, round, and reactive to light.  Cardiovascular:     Rate and Rhythm: Normal rate and regular rhythm.     Pulses: Normal pulses.     Heart sounds: Normal heart sounds.  Pulmonary:     Effort: Pulmonary effort is normal.     Breath sounds: Normal breath sounds.  Musculoskeletal:     Cervical back: No tenderness.      Comments: Left shoulder: Full range of motion but complaining of pain.  Lymphadenopathy:     Cervical: No cervical adenopathy.  Skin:    General: Skin is warm and dry.  Neurological:     General: No focal deficit present.     Mental Status: He is alert and oriented to person, place, and time.  Psychiatric:        Mood and Affect: Mood normal.        Behavior: Behavior normal.    Results for orders placed or performed in visit on 06/14/22 (from the past 24 hour(s))  POCT HgB A1C     Status: Abnormal   Collection Time: 06/14/22  3:18 PM  Result Value Ref Range   Hemoglobin A1C 7.0 (A) 4.0 - 5.6 %   HbA1c POC (<> result, manual entry)     HbA1c, POC (prediabetic range)     HbA1c, POC (controlled diabetic range)       DG Shoulder Left  Result Date: 06/14/2022 CLINICAL DATA:  Shoulder pain for 2 weeks EXAM: LEFT SHOULDER - 2+ VIEW COMPARISON:  10/01/2021 FINDINGS: Mild glenohumeral and AC joint degenerative change. No fracture or malalignment. The left apex is clear IMPRESSION: Mild degenerative changes. Electronically Signed   By: Donavan Foil M.D.   On: 06/14/2022 15:31    ASSESSMENT & PLAN: A total of 47 minutes was spent with the patient and counseling/coordination of care regarding preparing for this visit, review of most recent office visit notes, review of most recent sports medicine office visit notes, review of today's left shoulder x-ray, review of multiple chronic medical conditions and their management, review of all medications, cardiovascular risks associated with hypertension and diabetes, education on nutrition, review of most recent blood work results including today's interpretation of hemoglobin A1c, prognosis, documentation, and need for follow-up in 3 months.  Problem List Items Addressed This Visit       Cardiovascular and Mediastinum   Hypertension associated with diabetes ([place]) - Primary    Well-controlled hypertension. BP Readings from Last 3 Encounters:   06/14/22 118/84  06/08/22 122/82  04/06/22 (!) 140/88  Continue amlodipine 10 mg daily and lisinopril 20 mg daily. Hemoglobin A1c at 7.0 Diet and nutrition discussed. Cardiovascular risks associated with hypertension and diabetes discussed. Recommend to continue Trulicity 1.5 mg weekly along with metformin 500 mg twice a day and Jardiance 25 mg daily. Follow-up in 3 months.       Relevant Orders   POCT HgB A1C (Completed)   CBC with Differential/Platelet   Comprehensive metabolic panel   Chronic diastolic heart failure (HCC)    No clinical signs of acute systolic heart failure. Stable.  Well-controlled hypertension.  Normovolemic. Wt Readings from Last 3 Encounters:  06/14/22 232 lb 4 oz (105.3 kg)  06/08/22 228 lb (103.4 kg)  04/06/22 230 lb (104.3 kg)          Respiratory   OSA (obstructive sleep apnea)    Stable.  Well-controlled.        Digestive   GERD (gastroesophageal reflux disease)    Stable and asymptomatic.        Endocrine   Dyslipidemia associated with type 2 diabetes mellitus ([place])    Stable.  Diet and nutrition discussed. Continue atorvastatin 20 mg daily. Lipid profile done today.      Relevant Orders   Comprehensive metabolic panel   Lipid panel     Other   Class 2 severe obesity due to excess calories with serious comorbidity and body mass index (BMI) of 37.0 to 37.9 in adult [facility])   Acute pain of left shoulder    Of 3-week duration.  Affecting quality of life. X-ray done today.  Mild degenerative changes noted. Pain management discussed.  Recommend Tylenol as needed for pain May need to follow-up with orthopedist May need MRI of left shoulder if not better or worse during the next several weeks. Recently saw sports medicine for this problem.      Relevant Orders   DG Shoulder Left (Completed)   Patient Instructions  Shoulder Pain Many things can cause shoulder pain, including: An injury. Moving the shoulder in the same way again  and again (overuse). Joint pain (arthritis). Pain can come from: Swelling and irritation (inflammation) of any part of the shoulder. An injury to the shoulder joint. An injury to: Tissues that connect muscle to bone (tendons). Tissues that connect bones to each other (ligaments). Bones. Follow these instructions at home: Watch for changes in your symptoms. Let your doctor know about them. Follow these instructions to help with your pain. If you have a sling: Wear the sling as told by your doctor. Remove it only as told by your doctor. Loosen the sling if your fingers: Tingle. Become numb. Turn cold and blue. Keep the sling clean. If the sling is not waterproof: Do not let it get wet. Take the sling off when you shower or bathe. Managing pain, stiffness, and swelling  If told, put ice on the painful area: Put ice in a plastic bag. Place a towel between your skin and the bag. Leave the ice on for 20 minutes, 2-3 times a day. Stop putting ice on if it does not help with the pain. Squeeze a soft ball or a foam pad as much as possible. This prevents swelling in the shoulder. It also helps to strengthen the arm. General instructions Take over-the-counter and prescription medicines only as told by your doctor. Keep all follow-up visits as told by your doctor. This is important. Contact a doctor if: Your pain gets worse. Medicine does not help your pain. You have new pain  in your arm, hand, or fingers. Get help right away if: Your arm, hand, or fingers: Tingle. Are numb. Are swollen. Are painful. Turn white or blue. Summary Shoulder pain can be caused by many things. These include injury, moving the shoulder in the same away again and again, and joint pain. Watch for changes in your symptoms. Let your doctor know about them. This condition may be treated with a sling, ice, and pain medicine. Contact your doctor if the pain gets worse or you have new pain. Get help right away if  your arm, hand, or fingers tingle or get numb, swollen, or painful. Keep all follow-up visits as told by your doctor. This is important. This information is not intended to replace advice given to you by your health care provider. Make sure you discuss any questions you have with your health care provider. Document Revised: 01/02/2021 Document Reviewed: 01/02/2021 Elsevier Patient Education  [place], MD [facility]

## 2022-06-14 NOTE — Assessment & Plan Note (Signed)
Well-controlled hypertension. BP Readings from Last 3 Encounters:  06/14/22 118/84  06/08/22 122/82  04/06/22 (!) 140/88  Continue amlodipine 10 mg daily and lisinopril 20 mg daily. Hemoglobin A1c at 7.0 Diet and nutrition discussed. Cardiovascular risks associated with hypertension and diabetes discussed. Recommend to continue Trulicity 1.5 mg weekly along with metformin 500 mg twice a day and Jardiance 25 mg daily. Follow-up in 3 months.

## 2022-06-14 NOTE — Assessment & Plan Note (Signed)
Stable. Well controlled.

## 2022-06-17 ENCOUNTER — Telehealth: Payer: Self-pay | Admitting: Physical Medicine and Rehabilitation

## 2022-06-17 NOTE — Telephone Encounter (Signed)
Spoke with patient and rescheduled to 07/07/22

## 2022-06-17 NOTE — Telephone Encounter (Signed)
Patient called. Would like to [facility] his appointment with Dr. Ernestina Patches.

## 2022-06-18 ENCOUNTER — Ambulatory Visit (INDEPENDENT_AMBULATORY_CARE_PROVIDER_SITE_OTHER): Payer: 59

## 2022-06-18 ENCOUNTER — Ambulatory Visit (INDEPENDENT_AMBULATORY_CARE_PROVIDER_SITE_OTHER): Payer: 59 | Admitting: Surgical

## 2022-06-18 DIAGNOSIS — M1712 Unilateral primary osteoarthritis, left knee: Secondary | ICD-10-CM | POA: Diagnosis not present

## 2022-06-18 DIAGNOSIS — M7542 Impingement syndrome of left shoulder: Secondary | ICD-10-CM | POA: Diagnosis not present

## 2022-06-18 DIAGNOSIS — M25521 Pain in right elbow: Secondary | ICD-10-CM | POA: Diagnosis not present

## 2022-06-20 ENCOUNTER — Encounter: Payer: Self-pay | Admitting: Orthopedic Surgery

## 2022-06-20 MED ORDER — LIDOCAINE HCL 1 % IJ SOLN
5.0000 mL | INTRAMUSCULAR | Status: AC | PRN
  Administered 2022-06-18: 5 mL

## 2022-06-20 MED ORDER — METHYLPREDNISOLONE ACETATE 40 MG/ML IJ SUSP
40.0000 mg | INTRAMUSCULAR | Status: AC | PRN
  Administered 2022-06-18: 40 mg via INTRA_ARTICULAR

## 2022-06-20 MED ORDER — BUPIVACAINE HCL 0.25 % IJ SOLN
4.0000 mL | INTRAMUSCULAR | Status: AC | PRN
  Administered 2022-06-18: 4 mL via INTRA_ARTICULAR

## 2022-06-20 MED ORDER — BUPIVACAINE HCL 0.5 % IJ SOLN
9.0000 mL | INTRAMUSCULAR | Status: AC | PRN
  Administered 2022-06-18: 9 mL via INTRA_ARTICULAR

## 2022-06-20 NOTE — Progress Notes (Signed)
Office Visit Note   [name]           Date of Birth: [birth year]           MRN: [id number] Visit Date: 06/18/2022 Requested by: Horald Pollen, [place],  [place] [address] PCP: Horald Pollen, MD  Subjective: Chief Complaint  Patient presents with  . Left Knee - Pain    HPI: Scott [name] is a 52 y.o. male who presents to the office reporting left knee, left shoulder, right elbow pain.  Patient states that he has continued left knee pain.  Localizes pain to the medial aspect of the knee with occasional popping.  No new injury.  No groin pain or radicular/numbness/tingling.  He had Toradol injection on 04/21/2022 that gave him some relief but cortisone does better for him.  He has had prior gel injection into the knee without any significant relief.  He would like to try cortisone injection today.  Patient also complains of left shoulder pain with insidious onset over the last month.  He states this is worsening with aching pain in the lateral aspect of the left shoulder and the mid humerus region.  He does note history of prior right-sided rotator cuff surgery but no left-sided shoulder surgery.  He is right-hand dominant.  Taking Tylenol with some relief.  Does not wake with pain but it can occasionally keep him up at night and make it little bit more difficult to fall asleep.  Also complains of right elbow pain that he localizes to the lateral epicondyle.  Denies any history of recent incident or injury to the elbow.  States that this has been worse with elbow extension, wrist extension, grip strength.  Describes a constant ache.  No history of prior surgery.  Denies any weakness, just complains of pain.  He had prior tennis elbow injection for similar complaint about a year ago by Dr. Barbaraann Barthel with great relief.               ROS: All systems reviewed are negative as they relate to the chief complaint within the history of present  illness.  Patient denies fevers or chills.  Assessment & Plan: Visit Diagnoses:  1. Unilateral primary osteoarthritis, left knee   2. Pain in right elbow     Plan: Patient is a 52 year old male who presents complaining of left knee, left shoulder, right elbow pain.  Has history of left knee arthritis with prior meniscal debridement.  Radiographs taken today demonstrate no significant progression of arthritis.  Plan to try left knee cortisone injection which has done well for him in the past.  Tolerated injection without difficulty.  Regarding the right elbow, impression is tennis elbow.  He has seen Dr. Georgina Snell for this as well.  Dr. Bertram Millard has referred him to physical therapy.  I recommended he continue with this and if this does not help him, we could consider PRP injection.  However, he is only had several weeks of symptoms so physical therapy is best first option.  Regarding his left shoulder, he has excellent rotator cuff strength on exam today.  No significant findings on radiographs.  He does have positive impingement signs so plan to try subacromial injection.  Tolerated this injection well today into the left shoulder.  Recommended he reach out to the office for further evaluation of the left shoulder pain if he has no relief from injection by 1 to 2 weeks afterward.  p Instructions: No follow-ups on file.   Orders:  Orders Placed This Encounter  Procedures  . XR KNEE 3 VIEW LEFT  . XR Elbow Complete Right (3+View)   No orders of the defined types were placed in this encounter.     Procedures: Large Joint Inj: L knee on 06/18/2022 9:36 PM Indications: diagnostic evaluation, joint swelling and pain Details: 18 G 1.5 in needle, superolateral approach  Arthrogram: No  Medications: 5 mL lidocaine 1 %; 40 mg methylPREDNISolone acetate 40 MG/ML; 4 mL bupivacaine 0.25 % Outcome: tolerated well, no immediate complications Procedure, treatment alternatives, risks and benefits explained,  specific risks discussed. Consent was given by the patient. Immediately prior to procedure a time out was called to verify the correct patient, procedure, equipment, support staff and site/side marked as required. Patient was prepped and draped in the usual sterile fashion.    Large Joint Inj: L subacromial bursa on 06/18/2022 9:37 PM Indications: diagnostic evaluation and pain Details: 18 G 1.5 in needle, posterior approach  Arthrogram: No  Medications: 9 mL bupivacaine 0.5 %; 40 mg methylPREDNISolone acetate 40 MG/ML; 5 mL lidocaine 1 % Outcome: tolerated well, no immediate complications Procedure, treatment alternatives, risks and benefits explained, specific risks discussed. Consent was given by the patient. Immediately prior to procedure a time out was called to verify the correct patient, procedure, equipment, support staff and site/side marked as required. Patient was prepped and draped in the usual sterile fashion.     Clinical Data: No additional findings.  Objective: Vital Signs: There were no vitals taken for this visit.  Physical Exam:  Constitutional: Patient appears well-developed HEENT:  Head: Normocephalic Eyes:EOM are normal Neck: Normal range of motion Cardiovascular: Normal rate Pulmonary/chest: Effort normal Neurologic: Patient is alert Skin: Skin is warm Psychiatric: Patient has normal mood and affect  Ortho Exam: Ortho exam demonstrates left knee with 0 degrees extension.  No effusion.  No cellulitis or skin changes noted to the left knee.  No pain with hip range of motion.  Able to perform straight leg raise.  Left shoulder with 40 degrees X rotation, 110 degrees abduction, 180 degrees forward elevation.  This compared with the right shoulder with 35 degrees external rotation, 90 degrees abduction, 180 degrees forward elevation.  He has positive Neer's impingement sign on the left.  Positive Hawkins impingement sign on the left.  Excellent rotator cuff  strength of supra, infra, subscap rated 5/5 of the left shoulder.  There is no coarseness or grinding noted with passive motion shoulder.  Mild tenderness over the bicipital groove.  No tenderness over the [facility] joint.  No tenderness over the acromion.  Left elbow with full active and passive range of motion.  2+ radial pulse of the left and right upper extremities.  He has mildly increased pain with grip strength and wrist extension strength testing.  Has tenderness over the lateral epicondyle as well as tenderness about 4 cm distal which are about equivalent to 1 other.  No medial epicondyle tenderness.  He has excellent bicep and tricep strength with bicep tendon palpable.  Specialty Comments:  MRI LUMBAR SPINE WITHOUT CONTRAST     TECHNIQUE:  Multiplanar, multisequence MR imaging of the lumbar spine was  performed. No intravenous contrast was administered.     COMPARISON:  Prior radiograph from 01/16/2020 and MRI from  02/05/2017.     FINDINGS:  Segmentation:  Standard.     Alignment:  Physiologic.  No listhesis.     Vertebrae: Vertebral  body height maintained without acute or chronic  fracture. Bone marrow signal intensity within normal limits. No  discrete or worrisome osseous lesions. Mild reactive marrow edema  noted about the right greater than left L4-5 facets due to facet  arthritis. No other abnormal marrow edema.     Conus medullaris and cauda equina: Conus extends to the L1 level.  Conus and cauda equina appear normal.     Paraspinal and other soft tissues: Unremarkable.     Disc levels:     L1-2:  Unremarkable.     L2-3:  Unremarkable.     L3-4: Normal interspace. Minimal facet spurring. No canal or  foraminal stenosis. No impingement.     L4-5: Disc desiccation. Shallow broad-based left subarticular disc  protrusion indents the left ventral thecal sac, encroaching upon the  left lateral recess. Associated central annular fissure. Moderate  bilateral facet  hypertrophy, mildly progressed from previous.  Resultant mild canal with moderate left lateral recess stenosis,  descending L5 nerve root level. Mild to moderate bilateral L4  foraminal narrowing. Appearance is overall relatively similar to  previous.     L5-S1: Normal interspace. Moderate right with mild left facet  hypertrophy, mildly progressed from previous. No stenosis or  impingement.     IMPRESSION:  1. Shallow left subarticular disc protrusion at L4-5 with resultant  mild canal and moderate left lateral recess stenosis, potentially  affecting the descending left L5 nerve root.  2. Moderate bilateral facet hypertrophy at L4-5 and L5-S1, mildly  progressed from previous, and could contribute to lower back pain.        Electronically Signed    By: Jeannine Boga M.D.    On: 01/27/2020 01:46  Imaging: No results found.   PMFS History: Patient Active Problem List   Diagnosis Date Noted  . GERD (gastroesophageal reflux disease) 03/31/2021  . History of colonic polyps 11/21/2020  . Acute pain of left shoulder 11/20/2020  . Posterior calcaneal exostosis 07/21/2020  . Trigger thumb of right hand 07/03/2020  . Hyperlipidemia 04/23/2020  . Class 2 severe obesity due to excess calories with serious comorbidity and body mass index (BMI) of 37.0 to 37.9 in adult ([place]) 02/13/2019  . Sleep apnea 01/23/2019  . Arthritis 01/23/2019  . Chronic diastolic heart failure ([place]) 11/07/2016  . OSA (obstructive sleep apnea) 04/18/2012  . 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 Pulmonary embolism ([place]) 09/02/2011  . Dyslipidemia associated with type 2 diabetes mellitus ([place]) 09/02/2011  . Hypertension associated with diabetes ([place]) 09/02/2011   Past Medical History:  Diagnosis Date  . Arthritis   . Carpal tunnel syndrome   . Chronic migraine 01/23/2019  . Diabetes mellitus   . Diastolic heart failure ([place])   .  GERD (gastroesophageal reflux disease)   . Hypertension   . Obesity   . Pulmonary embolism ([place]) 2013   He has a history of HTN.  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 A repeat scan done in 2017 did not demonstrate evidence of emboli. Neither study demonstrated any coronary calcifications.   . Shoulder pain, bilateral   . Sleep apnea    was fitted for mask and never got machine - too costly    Family History  Problem Relation Age of Onset  . Hypertension Mother   . Kidney disease Father   . Hypertension Other   . Hyperlipidemia Other   . Diabetes Other  Past Surgical History:  Procedure Laterality Date  . CARPAL TUNNEL RELEASE Right 12/04/2015   Procedure: RIGHT CARPAL TUNNEL RELEASE;  Surgeon: Leanora Cover, MD;  Location: [place];  Service: Orthopedics;  Laterality: Right;  RIGHT CARPAL TUNNEL RELEASE  . CARPAL TUNNEL RELEASE Right 12/2015   [facility]  . CARPAL TUNNEL RELEASE Left 04/01/2016   Procedure: LEFT CARPAL TUNNEL RELEASE;  Surgeon: Leanora Cover, MD;  Location: [place];  Service: Orthopedics;  Laterality: Left;  . I & D EXTREMITY Left 02/19/2021   Procedure: LEFT FOOT SPUR REMOVAL;  Surgeon: Meredith Pel, MD;  Location: [place];  Service: Orthopedics;  Laterality: Left;  . KNEE ARTHROSCOPY WITH MEDIAL MENISECTOMY Left 02/19/2021   Procedure: LEFT KNEE ARTHROSCOPY, MENISCAL CYST DECOMPRESSION;  Surgeon: Meredith Pel, MD;  Location: [place];  Service: Orthopedics;  Laterality: Left;  . right hand surgery    . ROTATOR CUFF REPAIR Right    Social History   Occupational History  . Occupation: real Conservation officer, historic buildings  Tobacco Use  . Smoking status: Never  . Smokeless tobacco: Never  Vaping Use  . Vaping Use: Never used  Substance and Sexual Activity  . Alcohol use: No    Alcohol/week: 0.0 standard drinks of alcohol  . Drug use: No  . Sexual activity: Yes

## 2022-06-22 ENCOUNTER — Other Ambulatory Visit: Payer: Self-pay | Admitting: Family Medicine

## 2022-06-22 ENCOUNTER — Ambulatory Visit: Payer: 59 | Admitting: Physical Therapy

## 2022-06-22 NOTE — Telephone Encounter (Signed)
Rx refill request approved per Dr. Corey's orders. 

## 2022-06-23 ENCOUNTER — Encounter: Payer: 59 | Admitting: Physical Medicine and Rehabilitation

## 2022-06-28 ENCOUNTER — Ambulatory Visit: Payer: 59 | Admitting: Family Medicine

## 2022-07-01 ENCOUNTER — Ambulatory Visit (INDEPENDENT_AMBULATORY_CARE_PROVIDER_SITE_OTHER): Payer: 59 | Admitting: Physical Medicine and Rehabilitation

## 2022-07-01 ENCOUNTER — Ambulatory Visit: Payer: Self-pay

## 2022-07-01 VITALS — BP 132/86 | HR 90

## 2022-07-01 DIAGNOSIS — M47816 Spondylosis without myelopathy or radiculopathy, lumbar region: Secondary | ICD-10-CM | POA: Diagnosis not present

## 2022-07-01 MED ORDER — METHYLPREDNISOLONE ACETATE 80 MG/ML IJ SUSP
80.0000 mg | Freq: Once | INTRAMUSCULAR | Status: AC
  Administered 2022-07-01: 80 mg

## 2022-07-01 NOTE — Patient Instructions (Signed)

## 2022-07-01 NOTE — Progress Notes (Signed)
Functional Pain Scale - descriptive words and definitions  Intense (8)    Cannot complete any ADLs without much assistance/cannot concentrate/conversation is difficult/unable to sleep and unable to use distraction. Severe range order  Average Pain 8   +Driver, -BT, -Dye Allergies.  Lower back pain on both sides with no radiation

## 2022-07-02 NOTE — Procedures (Signed)
Lumbar Facet Joint Nerve Denervation  [name]      Date of Birth: [birth year] MRN: [id number] PCP: Horald Pollen, MD      Visit Date: 07/01/2022   Universal Protocol:    Date/Time: 03/01/246:03 AM  Consent Given By: the patient  Position: PRONE  Additional Comments: Vital signs were monitored before and after the procedure. Patient was prepped and draped in the usual sterile fashion. The correct patient, procedure, and site was verified.   Injection Procedure Details:   Procedure diagnoses:  1. Spondylosis without myelopathy or radiculopathy, lumbar region      Meds Administered:  Meds ordered this encounter  Medications   methylPREDNISolone acetate (DEPO-MEDROL) injection 80 mg     Laterality: Right  Location/Site:  L4-L5, L3 and L4 medial branches  Needle: 18 ga.,  25m active tip, 1549mRF Cannula  Needle Placement: Along juncture of superior articular process and transverse pocess  Findings:  -Comments:  Procedure Details: For each desired target nerve, the corresponding transverse process (sacral ala for the L5 dorsal rami) was identified and the fluoroscope was positioned to square off the endplates of the corresponding vertebral body to achieve a true AP midline view.  The beam was then obliqued 15 to 20 degrees and caudally tilted 15 to 20 degrees to line up a trajectory along the target nerves. The skin over the target of the junction of superior articulating process and transverse process (sacral ala for the L5 dorsal rami) was infiltrated with 81m27mf 1% Lidocaine without Epinephrine.  The 18 gauge 73m69mtive tip outer cannula was advanced in trajectory view to the target.  This procedure was repeated for each target nerve.  Then, for all levels, the outer cannula placement was fine-tuned and the position was then confirmed with bi-planar imaging.    Test stimulation was done both at sensory and motor levels to ensure there was no  radicular stimulation. The target tissues were then infiltrated with 1 ml of 1% Lidocaine without Epinephrine. Subsequently, a percutaneous neurotomy was carried out for 90 seconds at 80 degrees Celsius.  After the completion of the lesion, 1 ml of injectate was delivered. It was then repeated for each facet joint nerve mentioned above. Appropriate radiographs were obtained to verify the probe placement during the neurotomy.   Additional Comments:  The patient tolerated the procedure well Dressing: 2 x 2 sterile gauze and Band-Aid    Post-procedure details: Patient was observed during the procedure. Post-procedure instructions were reviewed.  Patient left the clinic in stable condition.

## 2022-07-02 NOTE — Progress Notes (Signed)
Scott [name] - 52 y.o. male MRN [id number]  Date of birth: [birth year]  Office Visit Note: Visit Date: 07/01/2022 PCP: Horald Pollen, MD Referred by: Horald Pollen, *  Subjective: Chief Complaint  Patient presents with   Lower Back - Pain   HPI:  Scott [name] is a 52 y.o. male who comes in todayfor planned repeat radiofrequency ablation of the Right L4-5 and L4-L5  Lumbar facet joints. This would be ablation of the corresponding medial branches and/or dorsal rami.  Patient has had double diagnostic blocks with more than 70% relief.  Subsequent ablation gave them more than 6 months of over 60% relief.  They have had chronic back pain for quite some time, more than 3 months, which has been an ongoing situation with recalcitrant axial back pain.  They have no radicular pain.  Their axial pain is worse with standing and ambulating and on exam today with facet loading.  They have had physical therapy as well as home exercise program.  The imaging noted in the chart below indicated facet pathology. Accordingly they meet all the criteria and qualification for for radiofrequency ablation and we are going to complete this today hopefully for more longer term relief as part of comprehensive management program.   ROS Otherwise per HPI.  Assessment & Plan: Visit Diagnoses:    ICD-10-CM   1. Spondylosis without myelopathy or radiculopathy, lumbar region  M47.816 XR C-ARM NO REPORT    Radiofrequency,Lumbar    methylPREDNISolone acetate (DEPO-MEDROL) injection 80 mg      Plan: No additional findings.   Meds & Orders:  Meds ordered this encounter  Medications   methylPREDNISolone acetate (DEPO-MEDROL) injection 80 mg    Orders Placed This Encounter  Procedures   Radiofrequency,Lumbar   XR C-ARM NO REPORT    Follow-up: Return for visit to requesting provider as needed.   Procedures: No procedures performed  Lumbar Facet Joint Nerve Denervation  [name]      Date of Birth: [birth year] MRN: [id number] PCP: Horald Pollen, MD      Visit Date: 07/01/2022   Universal Protocol:    Date/Time: 03/01/246:03 AM  Consent Given By: the patient  Position: PRONE  Additional Comments: Vital signs were monitored before and after the procedure. Patient was prepped and draped in the usual sterile fashion. The correct patient, procedure, and site was verified.   Injection Procedure Details:   Procedure diagnoses:  1. Spondylosis without myelopathy or radiculopathy, lumbar region      Meds Administered:  Meds ordered this encounter  Medications   methylPREDNISolone acetate (DEPO-MEDROL) injection 80 mg     Laterality: Right  Location/Site:  L4-L5, L3 and L4 medial branches  Needle: 18 ga.,  25m active tip, 1569mRF Cannula  Needle Placement: Along juncture of superior articular process and transverse pocess  Findings:  -Comments:  Procedure Details: For each desired target nerve, the corresponding transverse process (sacral ala for the L5 dorsal rami) was identified and the fluoroscope was positioned to square off the endplates of the corresponding vertebral body to achieve a true AP midline view.  The beam was then obliqued 15 to 20 degrees and caudally tilted 15 to 20 degrees to line up a trajectory along the target nerves. The skin over the target of the junction of superior articulating process and transverse process (sacral ala for the L5 dorsal rami) was infiltrated with 93m593mf 1% Lidocaine without Epinephrine.  The 18 gauge 50m393m  active tip outer cannula was advanced in trajectory view to the target.  This procedure was repeated for each target nerve.  Then, for all levels, the outer cannula placement was fine-tuned and the position was then confirmed with bi-planar imaging.    Test stimulation was done both at sensory and motor levels to ensure there was no radicular stimulation. The target tissues were then  infiltrated with 1 ml of 1% Lidocaine without Epinephrine. Subsequently, a percutaneous neurotomy was carried out for 90 seconds at 80 degrees Celsius.  After the completion of the lesion, 1 ml of injectate was delivered. It was then repeated for each facet joint nerve mentioned above. Appropriate radiographs were obtained to verify the probe placement during the neurotomy.   Additional Comments:  The patient tolerated the procedure well Dressing: 2 x 2 sterile gauze and Band-Aid    Post-procedure details: Patient was observed during the procedure. Post-procedure instructions were reviewed.  Patient left the clinic in stable condition.      Clinical History: MRI LUMBAR SPINE WITHOUT CONTRAST     TECHNIQUE:  Multiplanar, multisequence MR imaging of the lumbar spine was  performed. No intravenous contrast was administered.     COMPARISON:  Prior radiograph from 01/16/2020 and MRI from  02/05/2017.     FINDINGS:  Segmentation:  Standard.     Alignment:  Physiologic.  No listhesis.     Vertebrae: Vertebral body height maintained without acute or chronic  fracture. Bone marrow signal intensity within normal limits. No  discrete or worrisome osseous lesions. Mild reactive marrow edema  noted about the right greater than left L4-5 facets due to facet  arthritis. No other abnormal marrow edema.     Conus medullaris and cauda equina: Conus extends to the L1 level.  Conus and cauda equina appear normal.     Paraspinal and other soft tissues: Unremarkable.     Disc levels:     L1-2:  Unremarkable.     L2-3:  Unremarkable.     L3-4: Normal interspace. Minimal facet spurring. No canal or  foraminal stenosis. No impingement.     L4-5: Disc desiccation. Shallow broad-based left subarticular disc  protrusion indents the left ventral thecal sac, encroaching upon the  left lateral recess. Associated central annular fissure. Moderate  bilateral facet hypertrophy, mildly progressed  from previous.  Resultant mild canal with moderate left lateral recess stenosis,  descending L5 nerve root level. Mild to moderate bilateral L4  foraminal narrowing. Appearance is overall relatively similar to  previous.     L5-S1: Normal interspace. Moderate right with mild left facet  hypertrophy, mildly progressed from previous. No stenosis or  impingement.     IMPRESSION:  1. Shallow left subarticular disc protrusion at L4-5 with resultant  mild canal and moderate left lateral recess stenosis, potentially  affecting the descending left L5 nerve root.  2. Moderate bilateral facet hypertrophy at L4-5 and L5-S1, mildly  progressed from previous, and could contribute to lower back pain.        Electronically Signed    By: Jeannine Boga M.D.    On: 01/27/2020 01:46     Objective:  VS:  HT:    WT:   BMI:     BP:132/86  HR:90bpm  TEMP: ( )  RESP:  Physical Exam Vitals and nursing note reviewed.  Constitutional:      General: He is not in acute distress.    Appearance: Normal appearance. He is not ill-appearing.  HENT:  Head: Normocephalic and atraumatic.     Right Ear: External ear normal.     Left Ear: External ear normal.     Nose: No congestion.  Eyes:     Extraocular Movements: Extraocular movements intact.  Cardiovascular:     Rate and Rhythm: Normal rate.     Pulses: Normal pulses.  Pulmonary:     Effort: Pulmonary effort is normal. No respiratory distress.  Abdominal:     General: There is no distension.     Palpations: Abdomen is soft.  Musculoskeletal:        General: No tenderness or signs of injury.     Cervical back: Neck supple.     Right lower leg: No edema.     Left lower leg: No edema.     Comments: Patient has good distal strength without clonus.  Skin:    Findings: No erythema or rash.  Neurological:     General: No focal deficit present.     Mental Status: He is alert and oriented to person, place, and time.     Sensory: No  sensory deficit.     Motor: No weakness or abnormal muscle tone.     Coordination: Coordination normal.  Psychiatric:        Mood and Affect: Mood normal.        Behavior: Behavior normal.      Imaging: XR C-ARM NO REPORT  Result Date: 07/01/2022 Please see Notes tab for imaging impression.

## 2022-07-05 ENCOUNTER — Ambulatory Visit: Payer: 59 | Admitting: Family Medicine

## 2022-07-07 ENCOUNTER — Encounter: Payer: 59 | Admitting: Physical Medicine and Rehabilitation

## 2022-07-07 ENCOUNTER — Encounter: Payer: Self-pay | Admitting: Family Medicine

## 2022-07-07 ENCOUNTER — Ambulatory Visit (INDEPENDENT_AMBULATORY_CARE_PROVIDER_SITE_OTHER): Payer: 59 | Admitting: Family Medicine

## 2022-07-07 VITALS — BP 138/84 | Ht 66.0 in | Wt 228.0 lb

## 2022-07-07 DIAGNOSIS — M7711 Lateral epicondylitis, right elbow: Secondary | ICD-10-CM | POA: Diagnosis not present

## 2022-07-07 NOTE — Progress Notes (Signed)
PCP: Horald Pollen, MD  Subjective:   HPI: Patient is a 52 y.o. male here for right elbow pain.  Patient has history of right lateral epicondylitis. Has done home exercises, had injection in past for this. About 3 weeks ago started to get pain lateral right elbow again similar to what he's had previously. No new injury or trauma. No swelling. Pain has been improving over past several days  Past Medical History:  Diagnosis Date   Arthritis    Carpal tunnel syndrome    Chronic migraine 01/23/2019   Diabetes mellitus    Diastolic heart failure (HCC)    GERD (gastroesophageal reflux disease)    Hypertension    Obesity    Pulmonary embolism ([place]) 2013   He has a history of HTN.  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 A repeat scan done in 2017 did not demonstrate evidence of emboli. Neither study demonstrated any coronary calcifications.    Shoulder pain, bilateral    Sleep apnea    was fitted for mask and never got machine - too costly    Current Outpatient Medications on File Prior to Visit  Medication Sig Dispense Refill   amLODipine (NORVASC) 10 MG tablet TAKE ONE TABLET BY MOUTH DAILY 90 tablet 3   atorvastatin (LIPITOR) 20 MG tablet Take 1 tablet (20 mg total) by mouth daily. 90 tablet 3   fluticasone (FLONASE) 50 MCG/ACT nasal spray Place 2 sprays into both nostrils daily. (Patient taking differently: Place 2 sprays into both nostrils daily as needed for allergies.) 16 mL 0   glucose blood test strip Use as instructed. Inject into the skin once daily. E11.65 200 each 12   JARDIANCE 25 MG TABS tablet TAKE ONE TABLET BY MOUTH DAILY 90 tablet 3   lisinopril (ZESTRIL) 20 MG tablet Take 1 tablet (20 mg total) by mouth daily. 90 tablet 3   loratadine (CLARITIN) 10 MG tablet Take 10 mg by mouth daily as needed for allergies.     metFORMIN (GLUCOPHAGE) 500 MG tablet Take 1 tablet (500 mg total) by mouth 2 (two) times daily with a meal. 180  tablet 3   Multiple Vitamin (MULTIVITAMIN WITH MINERALS) TABS tablet Take 1 tablet by mouth daily.     terbinafine (LAMISIL) 250 MG tablet Take 1 tablet (250 mg total) by mouth daily. 90 tablet 0   tiZANidine (ZANAFLEX) 4 MG tablet TAKE ONE TABLET BY MOUTH EVERY 8 HOURS AS NEEDED FOR FOR MUSCLE SPASMS 30 tablet 1   trolamine salicylate (BLUE-EMU HEMP) 10 % cream Apply 1 application topically as needed for muscle pain.     TRULICITY 1.5 0000000 SOPN INJECT 1.5 MG UNDER THE SKIN ONCE WEEKLY 2 mL 7   zinc gluconate 50 MG tablet Take 50 mg by mouth daily.     No current facility-administered medications on file prior to visit.    Past Surgical History:  Procedure Laterality Date   CARPAL TUNNEL RELEASE Right 12/04/2015   Procedure: RIGHT CARPAL TUNNEL RELEASE;  Surgeon: Leanora Cover, MD;  Location: [facility];  Service: Orthopedics;  Laterality: Right;  RIGHT CARPAL TUNNEL RELEASE   CARPAL TUNNEL RELEASE Right 12/2015   [place]   CARPAL TUNNEL RELEASE Left 04/01/2016   Procedure: LEFT CARPAL TUNNEL RELEASE;  Surgeon: Leanora Cover, MD;  Location: [facility];  Service: Orthopedics;  Laterality: Left;   I & D EXTREMITY Left 02/19/2021   Procedure: LEFT FOOT SPUR REMOVAL;  Surgeon:  Meredith Pel, MD;  Location: [facility];  Service: Orthopedics;  Laterality: Left;   KNEE ARTHROSCOPY WITH MEDIAL MENISECTOMY Left 02/19/2021   Procedure: LEFT KNEE ARTHROSCOPY, MENISCAL CYST DECOMPRESSION;  Surgeon: Meredith Pel, MD;  Location: [facility];  Service: Orthopedics;  Laterality: Left;   right hand surgery     ROTATOR CUFF REPAIR Right     Allergies  Allergen Reactions   Pollen Extract Other (See Comments)    BP 138/84   Ht '5\' 6"'$  (1.676 m)   Wt 228 lb (103.4 kg)   BMI 36.80 kg/m      01/02/2020    2:21 PM  [facility]  Frequency of aerobic exercise (# of days/week) 5  Average time in minutes 45  Frequency of strengthening activities (#  of days/week) 0        No data to display              Objective:  Physical Exam:  Gen: NAD, comfortable in exam room  Right elbow: No deformity, swelling, bruising. FROM with 5/5 strength without reproduction of pain wrist extension and finger extension. Minimal tenderness to palpation lateral epicondyle. NVI distally. Collateral ligaments intact.   Assessment & Plan:  1. Right lateral epicondylitis - improving past few days.  Given exam with minimal pain now too advised against physical therapy, home exercises as almost completely improved.  Topical voltaren gel, icing if needed.  Can consider injection but discussed  risks/benefits of this and those with injection more likely to have issues long term though helps with short term pain relief.  F/u prn.

## 2022-07-12 ENCOUNTER — Ambulatory Visit (INDEPENDENT_AMBULATORY_CARE_PROVIDER_SITE_OTHER): Payer: 59 | Admitting: Physical Medicine and Rehabilitation

## 2022-07-12 ENCOUNTER — Ambulatory Visit: Payer: Self-pay

## 2022-07-12 VITALS — BP 145/89 | HR 87

## 2022-07-12 DIAGNOSIS — M47816 Spondylosis without myelopathy or radiculopathy, lumbar region: Secondary | ICD-10-CM | POA: Diagnosis not present

## 2022-07-12 MED ORDER — METHYLPREDNISOLONE ACETATE 80 MG/ML IJ SUSP
80.0000 mg | Freq: Once | INTRAMUSCULAR | Status: AC
  Administered 2022-07-12: 80 mg

## 2022-07-12 NOTE — Patient Instructions (Signed)

## 2022-07-12 NOTE — Progress Notes (Signed)
Scott [name] - 52 y.o. male MRN [id number]  Date of birth: 04-11-1971  Office Visit Note: Visit Date: 07/12/2022 PCP: Horald Pollen, MD Referred by: Horald Pollen, *  Subjective: Chief Complaint  Patient presents with   Lower Back - Pain   HPI:  Scott [name] is a 52 y.o. male who comes in todayfor planned radiofrequency ablation of the Left L4-5 Lumbar facet joints. This would be ablation of the corresponding medial branches and/or dorsal rami.  Patient has had double diagnostic blocks with more than 50% relief.  These are documented on pain diary.  They have had chronic back pain for quite some time, more than 3 months, which has been an ongoing situation with recalcitrant axial back pain.  They have no radicular pain.  Their axial pain is worse with standing and ambulating and on exam today with facet loading.  They have had physical therapy as well as home exercise program.  The imaging noted in the chart below indicated facet pathology. Accordingly they meet all the criteria and qualification for for radiofrequency ablation and we are going to complete this today hopefully for more longer term relief as part of comprehensive management program.   ROS Otherwise per HPI.  Assessment & Plan: Visit Diagnoses:    ICD-10-CM   1. Spondylosis without myelopathy or radiculopathy, lumbar region  M47.816 XR C-ARM NO REPORT    Radiofrequency,Lumbar    methylPREDNISolone acetate (DEPO-MEDROL) injection 80 mg      Plan: No additional findings.   Meds & Orders:  Meds ordered this encounter  Medications   methylPREDNISolone acetate (DEPO-MEDROL) injection 80 mg    Orders Placed This Encounter  Procedures   Radiofrequency,Lumbar   XR C-ARM NO REPORT    Follow-up: Return for visit to requesting provider as needed.   Procedures: No procedures performed  Lumbar Facet Joint Nerve Denervation  [name]      Date of Birth: [birth year] MRN:  [id number] PCP: Horald Pollen, MD      Visit Date: 07/12/2022   Universal Protocol:    Date/Time: 03/11/248:34 AM  Consent Given By: the patient  Position: PRONE  Additional Comments: Vital signs were monitored before and after the procedure. Patient was prepped and draped in the usual sterile fashion. The correct patient, procedure, and site was verified.   Injection Procedure Details:   Procedure diagnoses:  1. Spondylosis without myelopathy or radiculopathy, lumbar region      Meds Administered:  Meds ordered this encounter  Medications   methylPREDNISolone acetate (DEPO-MEDROL) injection 80 mg     Laterality: Left  Location/Site:  L4-L5, L3 and L4 medial branches  Needle: 18 ga.,  65m active tip, 1549mRF Cannula  Needle Placement: Along juncture of superior articular process and transverse pocess  Findings:  -Comments:  Procedure Details: For each desired target nerve, the corresponding transverse process (sacral ala for the L5 dorsal rami) was identified and the fluoroscope was positioned to square off the endplates of the corresponding vertebral body to achieve a true AP midline view.  The beam was then obliqued 15 to 20 degrees and caudally tilted 15 to 20 degrees to line up a trajectory along the target nerves. The skin over the target of the junction of superior articulating process and transverse process (sacral ala for the L5 dorsal rami) was infiltrated with 50m60mf 1% Lidocaine without Epinephrine.  The 18 gauge 23m58mtive tip outer cannula was advanced in trajectory view to  the target.  This procedure was repeated for each target nerve.  Then, for all levels, the outer cannula placement was fine-tuned and the position was then confirmed with bi-planar imaging.    Test stimulation was done both at sensory and motor levels to ensure there was no radicular stimulation. The target tissues were then infiltrated with 1 ml of 1% Lidocaine without  Epinephrine. Subsequently, a percutaneous neurotomy was carried out for 90 seconds at 80 degrees Celsius.  After the completion of the lesion, 1 ml of injectate was delivered. It was then repeated for each facet joint nerve mentioned above. Appropriate radiographs were obtained to verify the probe placement during the neurotomy.   Additional Comments:  The patient tolerated the procedure well Dressing: 2 x 2 sterile gauze and Band-Aid    Post-procedure details: Patient was observed during the procedure. Post-procedure instructions were reviewed.  Patient left the clinic in stable condition.      Clinical History: MRI LUMBAR SPINE WITHOUT CONTRAST     TECHNIQUE:  Multiplanar, multisequence MR imaging of the lumbar spine was  performed. No intravenous contrast was administered.     COMPARISON:  Prior radiograph from 01/16/2020 and MRI from  02/05/2017.     FINDINGS:  Segmentation:  Standard.     Alignment:  Physiologic.  No listhesis.     Vertebrae: Vertebral body height maintained without acute or chronic  fracture. Bone marrow signal intensity within normal limits. No  discrete or worrisome osseous lesions. Mild reactive marrow edema  noted about the right greater than left L4-5 facets due to facet  arthritis. No other abnormal marrow edema.     Conus medullaris and cauda equina: Conus extends to the L1 level.  Conus and cauda equina appear normal.     Paraspinal and other soft tissues: Unremarkable.     Disc levels:     L1-2:  Unremarkable.     L2-3:  Unremarkable.     L3-4: Normal interspace. Minimal facet spurring. No canal or  foraminal stenosis. No impingement.     L4-5: Disc desiccation. Shallow broad-based left subarticular disc  protrusion indents the left ventral thecal sac, encroaching upon the  left lateral recess. Associated central annular fissure. Moderate  bilateral facet hypertrophy, mildly progressed from previous.  Resultant mild canal with  moderate left lateral recess stenosis,  descending L5 nerve root level. Mild to moderate bilateral L4  foraminal narrowing. Appearance is overall relatively similar to  previous.     L5-S1: Normal interspace. Moderate right with mild left facet  hypertrophy, mildly progressed from previous. No stenosis or  impingement.     IMPRESSION:  1. Shallow left subarticular disc protrusion at L4-5 with resultant  mild canal and moderate left lateral recess stenosis, potentially  affecting the descending left L5 nerve root.  2. Moderate bilateral facet hypertrophy at L4-5 and L5-S1, mildly  progressed from previous, and could contribute to lower back pain.        Electronically Signed    By: Jeannine Boga M.D.    On: 01/27/2020 01:46     Objective:  VS:  HT:    WT:   BMI:     BP:(!) 145/89  HR:87bpm  TEMP: ( )  RESP:  Physical Exam Vitals and nursing note reviewed.  Constitutional:      General: He is not in acute distress.    Appearance: Normal appearance. He is not ill-appearing.  HENT:     Head: Normocephalic and atraumatic.  Right Ear: External ear normal.     Left Ear: External ear normal.     Nose: No congestion.  Eyes:     Extraocular Movements: Extraocular movements intact.  Cardiovascular:     Rate and Rhythm: Normal rate.     Pulses: Normal pulses.  Pulmonary:     Effort: Pulmonary effort is normal. No respiratory distress.  Abdominal:     General: There is no distension.     Palpations: Abdomen is soft.  Musculoskeletal:        General: No tenderness or signs of injury.     Cervical back: Neck supple.     Right lower leg: No edema.     Left lower leg: No edema.     Comments: Patient has good distal strength without clonus.  Skin:    Findings: No erythema or rash.  Neurological:     General: No focal deficit present.     Mental Status: He is alert and oriented to person, place, and time.     Sensory: No sensory deficit.     Motor: No weakness or  abnormal muscle tone.     Coordination: Coordination normal.  Psychiatric:        Mood and Affect: Mood normal.        Behavior: Behavior normal.      Imaging: No results found.

## 2022-07-12 NOTE — Procedures (Signed)
Lumbar Facet Joint Nerve Denervation  [name]      Date of Birth: 1971-04-07 MRN: [id number] PCP: Horald Pollen, MD      Visit Date: 07/12/2022   Universal Protocol:    Date/Time: 03/11/248:34 AM  Consent Given By: the patient  Position: PRONE  Additional Comments: Vital signs were monitored before and after the procedure. Patient was prepped and draped in the usual sterile fashion. The correct patient, procedure, and site was verified.   Injection Procedure Details:   Procedure diagnoses:  1. Spondylosis without myelopathy or radiculopathy, lumbar region      Meds Administered:  Meds ordered this encounter  Medications   methylPREDNISolone acetate (DEPO-MEDROL) injection 80 mg     Laterality: Left  Location/Site:  L4-L5, L3 and L4 medial branches  Needle: 18 ga.,  67m active tip, 1542mRF Cannula  Needle Placement: Along juncture of superior articular process and transverse pocess  Findings:  -Comments:  Procedure Details: For each desired target nerve, the corresponding transverse process (sacral ala for the L5 dorsal rami) was identified and the fluoroscope was positioned to square off the endplates of the corresponding vertebral body to achieve a true AP midline view.  The beam was then obliqued 15 to 20 degrees and caudally tilted 15 to 20 degrees to line up a trajectory along the target nerves. The skin over the target of the junction of superior articulating process and transverse process (sacral ala for the L5 dorsal rami) was infiltrated with 69m58mf 1% Lidocaine without Epinephrine.  The 18 gauge 53m2mtive tip outer cannula was advanced in trajectory view to the target.  This procedure was repeated for each target nerve.  Then, for all levels, the outer cannula placement was fine-tuned and the position was then confirmed with bi-planar imaging.    Test stimulation was done both at sensory and motor levels to ensure there was no  radicular stimulation. The target tissues were then infiltrated with 1 ml of 1% Lidocaine without Epinephrine. Subsequently, a percutaneous neurotomy was carried out for 90 seconds at 80 degrees Celsius.  After the completion of the lesion, 1 ml of injectate was delivered. It was then repeated for each facet joint nerve mentioned above. Appropriate radiographs were obtained to verify the probe placement during the neurotomy.   Additional Comments:  The patient tolerated the procedure well Dressing: 2 x 2 sterile gauze and Band-Aid    Post-procedure details: Patient was observed during the procedure. Post-procedure instructions were reviewed.  Patient left the clinic in stable condition.

## 2022-07-12 NOTE — Progress Notes (Signed)
Functional Pain Scale - descriptive words and definitions  Mild (2)   Noticeable when not distracted/no impact on ADL's/sleep only slightly affected and able to   use both passive and active distraction for comfort. Mild range order  Average Pain 2   +Driver, -BT, -Dye Allergies.  Lower back pain. 2nd RFA

## 2022-07-13 ENCOUNTER — Ambulatory Visit: Payer: 59 | Admitting: Physical Therapy

## 2022-07-20 ENCOUNTER — Ambulatory Visit: Payer: 59 | Admitting: Family Medicine

## 2022-07-22 ENCOUNTER — Other Ambulatory Visit: Payer: Self-pay

## 2022-07-22 ENCOUNTER — Ambulatory Visit (INDEPENDENT_AMBULATORY_CARE_PROVIDER_SITE_OTHER): Payer: 59 | Admitting: Family Medicine

## 2022-07-22 VITALS — BP 158/100 | HR 95 | Ht 66.0 in | Wt 228.0 lb

## 2022-07-22 DIAGNOSIS — M25512 Pain in left shoulder: Secondary | ICD-10-CM

## 2022-07-22 DIAGNOSIS — G8929 Other chronic pain: Secondary | ICD-10-CM | POA: Diagnosis not present

## 2022-07-22 NOTE — Patient Instructions (Signed)
Thank you for coming in today.   You should hear from MRI scheduling within 1 week. If you do not hear please let me know.    Following MRI can expect either PT or surgical consultation.

## 2022-07-22 NOTE — Progress Notes (Deleted)
   Established Patient Office Visit  Subjective   Patient ID: Scott [name], male    DOB: 13-Oct-1970  Age: 52 y.o. MRN: [id number]  No chief complaint on file.   HPI  {History (Optional):23778}  ROS    Objective:     BP (!) 158/100   Pulse 95   Ht 5\' 6"  (1.676 m)   Wt 228 lb (103.4 kg)   SpO2 96%   BMI 36.80 kg/m  {Vitals History (Optional):23777}  Physical Exam   No results found for any visits on 07/22/22.  {Labs (Optional):23779}  The ASCVD Risk score (Arnett DK, et al., 2019) failed to calculate for the following reasons:   The valid total cholesterol range is 130 to 320 mg/dL    Assessment & Plan:   Problem List Items Addressed This Visit   None   No follow-ups on file.    [name], MD

## 2022-07-22 NOTE — Progress Notes (Signed)
   Shirlyn Goltz, PhD, LAT, ATC acting as a scribe for [name], MD.  Scott [name] is a 52 y.o. male who presents to [facility] today for f/u L shoulder and R elbow pain. Pt was last seen by Dr. Georgina Snell on 06/08/22 and was referred to PT. Pt later saw Dr. Barbaraann Barthel on 07/07/22 for the same issues. Today, pt reports L shoulder is still painful and has been waking him up at night. Dull throb. Pt notes his R elbow is feeling good.  He has been performing home exercise program taught previously for his shoulder since office visit dated June 08, 2022.  Exercises are at least daily and for at least 30 minutes including strengthening and stretching.  He has not improved.  He would like to proceed with more advanced imaging prior to dedicated physical therapy or repeat injection.  Dx imaging: 06/18/22 R elbow XR  06/14/22 L shoulder XR  Pertinent review of systems: No fevers or chills  Relevant historical information: Hypertension and diabetes.  History of pulmonary embolism.   Exam:  BP (!) 158/100   Pulse 95   Ht 5\' 6"  (1.676 m)   Wt 228 lb (103.4 kg)   SpO2 96%   BMI 36.80 kg/m  General: Well Developed, well nourished, and in no acute distress.   MSK: Left shoulder: Normal-appearing Tender palpation lateral upper arm. Normal motion pain with abduction. Positive Hawkins and Neer's test.  Positive empty can test. Negative Yergason's and speeds test. Intact strength.    Lab and Radiology Results  EXAM: LEFT SHOULDER - 2+ VIEW   COMPARISON:  10/01/2021   FINDINGS: Mild glenohumeral and AC joint degenerative change. No fracture or malalignment. The left apex is clear   IMPRESSION: Mild degenerative changes.     Electronically Signed   By: Donavan Foil M.D.   On: 06/14/2022 15:31   I, [name], personally (independently) visualized and performed the interpretation of the images attached in this note.      Assessment and  Plan: 52 y.o. male with left shoulder pain.  Pain is persistent despite trial of conservative management including home exercise program.  After discussion and medial decision making we will proceed to MRI of the shoulder to dictate future treatment options including potential surgery or repeat steroid injection.  Recheck after MRI.  If surgery is clearly indicated could potentially refer directly to orthopedic surgery.  He does have an established relationship with Dr. Marlou Sa.  PDMP not reviewed this encounter. Orders Placed This Encounter  Procedures   MR SHOULDER LEFT WO CONTRAST    Standing Status:   Future    Standing Expiration Date:   07/22/2023    Order Specific Question:   What is the patient's sedation requirement?    Answer:   No Sedation    Order Specific Question:   Does the patient have a pacemaker or implanted devices?    Answer:   No    Order Specific Question:   Preferred imaging location?    Answer:   [facility] (table limit-550lbs)   No orders of the defined types were placed in this encounter.    Discussed warning signs or symptoms. Please see discharge instructions. Patient expresses understanding.   The above documentation has been reviewed and is accurate and complete [name], M.D.

## 2022-08-12 ENCOUNTER — Other Ambulatory Visit: Payer: Self-pay | Admitting: Podiatry

## 2022-08-12 ENCOUNTER — Other Ambulatory Visit: Payer: Self-pay | Admitting: Family Medicine

## 2022-08-12 DIAGNOSIS — J019 Acute sinusitis, unspecified: Secondary | ICD-10-CM

## 2022-08-17 ENCOUNTER — Ambulatory Visit
Admission: RE | Admit: 2022-08-17 | Discharge: 2022-08-17 | Disposition: A | Discharge status: Home / Self Care | Payer: 59 | Source: Ambulatory Visit | Attending: Family Medicine | Admitting: Family Medicine

## 2022-08-17 DIAGNOSIS — G8929 Other chronic pain: Secondary | ICD-10-CM

## 2022-08-19 ENCOUNTER — Other Ambulatory Visit: Payer: Self-pay

## 2022-08-19 ENCOUNTER — Ambulatory Visit (INDEPENDENT_AMBULATORY_CARE_PROVIDER_SITE_OTHER): Payer: 59 | Admitting: Family Medicine

## 2022-08-19 ENCOUNTER — Encounter: Payer: Self-pay | Admitting: Family Medicine

## 2022-08-19 VITALS — BP 136/82 | HR 96 | Ht 66.0 in | Wt 228.0 lb

## 2022-08-19 DIAGNOSIS — M25512 Pain in left shoulder: Secondary | ICD-10-CM

## 2022-08-19 DIAGNOSIS — G8929 Other chronic pain: Secondary | ICD-10-CM | POA: Diagnosis not present

## 2022-08-19 NOTE — Patient Instructions (Signed)
Thank you for coming in today.   Call or go to the ER if you develop a large red swollen joint with extreme pain or oozing puss.    If this shot does not get you better quickly let me know  Check back in 6 weeks

## 2022-08-19 NOTE — Progress Notes (Signed)
I, Stevenson Clinch, CMA acting as a scribe for [name], MD.  Scott [name] is a 52 y.o. male who presents to [facility] at [facility] today for f/u L shoulder pain and MRI review. Pt was last seen by Dr. Denyse Amass on 07/22/22 and was advised to proceed to MRI. Pt already has a relationship w/ Dr. August Saucer. Today, pt reports slight worsening of sx since last visit. Now having more left-sided neck pain/tightness. Denies HA related to neck pain.   Dx imaging: 08/17/22 L shoulder MRI 06/14/22 L shoulder XR  Pertinent review of systems: No fevers or chills  Relevant historical information: Heart failure.  Hypertension.  Diabetes.   Exam:  BP 136/82   Pulse 96   Ht  (1.676 m)   Wt 228 lb (103.4 kg)   SpO2 98%   BMI 36.80 kg/m  General: Well Developed, well nourished, and in no acute distress.   MSK: Left shoulder normal-appearing Normal motion pain with abduction.  Positive Hawkins and Neer's test. Intact strength.   Lab and Radiology Results  Procedure: Real-time Ultrasound Guided Injection of left shoulder subacromial bursa Device: Philips Affiniti 50G Images permanently stored and available for review in PACS Verbal informed consent obtained.  Discussed risks and benefits of procedure. Warned about infection, bleeding, hyperglycemia damage to structures among others. Patient expresses understanding and agreement Time-out conducted.   Noted no overlying erythema, induration, or other signs of local infection.   Skin prepped in a sterile fashion.   Local anesthesia: Topical Ethyl chloride.   With sterile technique and under real time ultrasound guidance: 40 mg of Kenalog and 2 mL of Marcaine injected into subacromial bursa. Fluid seen entering the bursa.   Completed without difficulty   Pain immediately resolved suggesting accurate placement of the medication.   Advised to call if fevers/chills, erythema, induration, drainage, or persistent bleeding.    Images permanently stored and available for review in the ultrasound unit.  Impression: Technically successful ultrasound guided injection.      No results found for this or any previous visit (from the past 72 hour(s)). MR SHOULDER LEFT WO CONTRAST  Result Date: 08/19/2022 CLINICAL DATA:  Chronic left shoulder pain.  Bursitis suspected. EXAM: MRI OF THE LEFT SHOULDER WITHOUT CONTRAST TECHNIQUE: Multiplanar, multisequence MR imaging of the shoulder was performed. No intravenous contrast was administered. COMPARISON:  Left shoulder radiographs 06/14/2022 FINDINGS: Rotator cuff: There is mild intermediate T2 signal and thickening anterior supraspinatus tendinosis (coronal series 4, image 13 and sagittal series 6, image 5). Minimal superior subscapularis intermediate T2 signal tendinosis (sagittal series 6, image 10 and axial series 3 image 13). The teres minor is intact. Muscles: No rotator cuff muscle atrophy, fatty infiltration, or edema. Biceps long head: The intra-articular long head of the biceps tendon is intact. Acromioclavicular Joint: There are moderate degenerative changes of the acromioclavicular joint including joint space narrowing, subchondral marrow edema, and peripheral osteophytosis. Type II acromion. Mild fluid within the subacromial/subdeltoid bursa. Glenohumeral Joint: Moderate inferior medial humeral head cartilage thinning. There is intermediate T2 signal within the fall thickness of a small portion of the mid glenoid cartilage measuring up to 4 mm in AP dimension and 7 mm in craniocaudal dimension, however no fluid bright defect is seen in that region (axial series 3, image 14 and coronal series 4, image 10). No glenohumeral joint effusion. Labrum: Grossly intact, but evaluation is limited by lack of intraarticular fluid. Bones:  No acute fracture. Other: None. IMPRESSION: 1.  Mild anterior supraspinatus and minimal superior subscapularis tendinosis. No rotator cuff tear. 2. Moderate  degenerative changes of the acromioclavicular joint. 3. Mild subacromial/subdeltoid bursitis. 4. Mild to moderate glenohumeral cartilage thinning. Electronically Signed   By: Neita Garnet M.D.   On: 08/19/2022 10:29    I, [name], personally (independently) visualized and performed the interpretation of the images attached in this note.    Assessment and Plan: 52 y.o. male with chronic left shoulder pain failing typical conservative management including home exercise program.  Plan for trial of subacromial injection today.  Based on his MRI he has potentially 3 pain generators.  Subacromial bursitis and impingement with rotator cuff tendinopathy as one pain generator, glenohumeral arthritis is a potential for second pain generator and AC arthritis is a potential third pain generator.  If not much better following subacromial injection he will let me know and we can proceed to trial glenohumeral injection which is the next most likely source of pain.  Continue home exercise program. PDMP not reviewed this encounter. Orders Placed This Encounter  Procedures   [place] LIMITED JOINT SPACE STRUCTURES UP LEFT(NO LINKED CHARGES)    Order Specific Question:   Reason for Exam (SYMPTOM  OR DIAGNOSIS REQUIRED)    Answer:   left shoulder pain    Order Specific Question:   Preferred imaging location?    Answer:   [facility]   No orders of the defined types were placed in this encounter.    Discussed warning signs or symptoms. Please see discharge instructions. Patient expresses understanding.   The above documentation has been reviewed and is accurate and complete [name], M.D.

## 2022-08-19 NOTE — Progress Notes (Signed)
Shoulder MRI shows some rotator cuff tendinitis and some arthritis of the main shoulder joint.  Recommend return to clinic so we can go over the results in full detail and likely proceed to an injection.

## 2022-08-20 ENCOUNTER — Ambulatory Visit: Payer: 59 | Admitting: Family Medicine

## 2022-08-23 ENCOUNTER — Other Ambulatory Visit: Payer: Self-pay

## 2022-08-23 ENCOUNTER — Ambulatory Visit (INDEPENDENT_AMBULATORY_CARE_PROVIDER_SITE_OTHER): Payer: 59 | Admitting: Family Medicine

## 2022-08-23 VITALS — BP 136/86 | HR 100 | Ht 66.0 in | Wt 229.0 lb

## 2022-08-23 DIAGNOSIS — G8929 Other chronic pain: Secondary | ICD-10-CM

## 2022-08-23 DIAGNOSIS — M25512 Pain in left shoulder: Secondary | ICD-10-CM

## 2022-08-23 NOTE — Progress Notes (Signed)
   Rubin Payor, PhD, LAT, ATC acting as a scribe for [name], MD.  Scott [name] is a 52 y.o. male who presents to [facility] at [facility] today for cont'd L arm pain. Pt was last seen by Dr. Denyse Amass on 08/19/22 and was given a L subacromial steroid injection and was advised to return for a GH injection if not better. Today, pt reports slight benefit from prior subacromial steroid injection. Pt c/o pain radiating down into his upper arm.  He notes pain with crossover arm activity.  Dx imaging: 08/17/22 L shoulder MRI 06/14/22 L shoulder XR  Pertinent review of systems: No fevers or chills  Relevant historical information: Hypertension and diabetes.   Exam:  BP 136/86   Pulse 100   Ht  (1.676 m)   Wt 229 lb (103.9 kg)   SpO2 97%   BMI 36.96 kg/m  General: Well Developed, well nourished, and in no acute distress.   MSK: Left shoulder normal-appearing Tender palpation AC joint.  Pain with crossover arm compression.    Lab and Radiology Results  Procedure: Real-time Ultrasound Guided Injection of left shoulder AC joint Device: Philips Affiniti 50G Images permanently stored and available for review in PACS Verbal informed consent obtained.  Discussed risks and benefits of procedure. Warned about infection, bleeding, hyperglycemia damage to structures among others. Patient expresses understanding and agreement Time-out conducted.   Noted no overlying erythema, induration, or other signs of local infection.   Skin prepped in a sterile fashion.   Local anesthesia: Topical Ethyl chloride.   With sterile technique and under real time ultrasound guidance: 40 mg of Kenalog and 1 mL of Marcaine injected into AC joint. Fluid seen entering the joint capsule.   Completed without difficulty   Pain moderately  resolved suggesting accurate placement of the medication.   Advised to call if fevers/chills, erythema, induration, drainage, or persistent bleeding.    Images permanently stored and available for review in the ultrasound unit.  Impression: Technically successful ultrasound guided injection.         Assessment and Plan: 52 y.o. male with left shoulder pain.  Multifactorial.  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  Last week he had a subacromial injection that provided moderate relief.  He still has pain so after consultation and discussion today plan to proceed [facility] joint injection as the next most likely source of pain.  If this does not work glenohumeral injection may be helpful.   PDMP not reviewed this encounter. Orders Placed This Encounter  Procedures   [place] LIMITED JOINT SPACE STRUCTURES UP LEFT(NO LINKED CHARGES)    Order Specific Question:   Reason for Exam (SYMPTOM  OR DIAGNOSIS REQUIRED)    Answer:   left shoulder pain    Order Specific Question:   Preferred imaging location?    Answer:   [facility]   No orders of the defined types were placed in this encounter.    Discussed warning signs or symptoms. Please see discharge instructions. Patient expresses understanding.   [email or web address]

## 2022-08-23 NOTE — Patient Instructions (Addendum)
Thank you for coming in today.   You received an injection today. Seek immediate medical attention if the joint becomes red, extremely painful, or is oozing fluid.   Let me know how this goes. 

## 2022-08-30 ENCOUNTER — Telehealth: Payer: Self-pay | Admitting: Physical Medicine and Rehabilitation

## 2022-08-30 ENCOUNTER — Telehealth: Payer: Self-pay | Admitting: Orthopedic Surgery

## 2022-08-30 NOTE — Telephone Encounter (Signed)
Patient asking for a Handicap placard renewal. Asking if he can get a permanent one til after his surgery. Please advise

## 2022-08-30 NOTE — Telephone Encounter (Signed)
Patient states he is still experiencing pain and wants to see Dr. Alvester Morin

## 2022-08-31 NOTE — Telephone Encounter (Signed)
Okay by me.

## 2022-09-01 NOTE — Telephone Encounter (Signed)
Placed this upfront. Patient advised.

## 2022-09-01 NOTE — Telephone Encounter (Signed)
Spoke with patient and he has started to get pain again on both sides, no radiation into legs. He had RFA on 07/01/22 and 07/12/22. Please advise

## 2022-09-03 NOTE — Telephone Encounter (Signed)
Left detailed voicemail to explain that we can discuss further options

## 2022-09-06 ENCOUNTER — Telehealth: Payer: Self-pay | Admitting: Family Medicine

## 2022-09-06 NOTE — Telephone Encounter (Signed)
Patient called to let Dr Denyse Amass know that his left shoulder is still bothering him. Please advise on next steps.

## 2022-09-07 NOTE — Telephone Encounter (Signed)
It has been just over 2 weeks since last inj - forwarding to Dr. Denyse Amass to advise when OK to repeat inj  Per visit note 08/23/22:  Assessment and Plan: 52 y.o. male with left shoulder pain.  Multifactorial.  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  Last week he had a subacromial injection that provided moderate relief.  He still has pain so after consultation and discussion today plan to proceed [facility] joint injection as the next most likely source of pain.  If this does not work glenohumeral injection may be helpful.

## 2022-09-08 NOTE — Telephone Encounter (Signed)
Pt has scheduled appt 09/30/22, please reach out to pt to see if he would like to come in sooner for repeat injection (OK per Dr. Denyse Amass).

## 2022-09-08 NOTE — Telephone Encounter (Signed)
Okay to return for injection this week.

## 2022-09-08 NOTE — Telephone Encounter (Signed)
Tried to call patient but was not able to leave a VM.

## 2022-09-09 NOTE — Telephone Encounter (Signed)
Moved up to 09/13/2022

## 2022-09-09 NOTE — Telephone Encounter (Signed)
Tried again-unable to leave VM. Sent MyChart msg to ctc [place] for scheduling.

## 2022-09-10 NOTE — Progress Notes (Unsigned)
   Scott Payor, PhD, LAT, ATC acting as a scribe for Scott Graham, MD.  Scott [name] is a 52 y.o. male who presents to [facility] at [facility] today for con'td L shoulder pain. Pt was last seen by Dr. Denyse Amass on 08/23/22 and was given a L AC joint steroid injection and had L subacromial steroid injection on 08/19/22. Today, pt reports L shoulder is still painful and "aching."  Dx imaging: 08/17/22 L shoulder MRI 06/14/22 L shoulder XR  Pertinent review of systems: No fevers or chills  Relevant historical information: Chronic diastolic heart failure and hypertension.  Dyslipidemia.   Exam:  BP 124/72   Pulse 100   Ht 5\' 6"  (1.676 m)   Wt 229 lb (103.9 kg)   SpO2 97%   BMI 36.96 kg/m  General: Well Developed, well nourished, and in no acute distress.   MSK: Left shoulder: Normal appearing. Normal motion.  Pain with abduction.    Lab and Radiology Results   Procedure: Real-time Ultrasound Guided Injection of left shoulder glenohumeral joint posterior approach Device: Philips Affiniti 50G Images permanently stored and available for review in PACS Verbal informed consent obtained.  Discussed risks and benefits of procedure. Warned about infection, bleeding, hyperglycemia damage to structures among others. Patient expresses understanding and agreement Time-out conducted.   Noted no overlying erythema, induration, or other signs of local infection.   Skin prepped in a sterile fashion.   Local anesthesia: Topical Ethyl chloride.   With sterile technique and under real time ultrasound guidance: 40 mg of Kenalog and 2 ml Marcaine injected into glenohumeral joint. Fluid seen entering the joint capsule.   Completed without difficulty   Pain moderately resolved suggesting accurate placement of the medication.   Advised to call if fevers/chills, erythema, induration, drainage, or persistent bleeding.   Images permanently stored and available for review in the  ultrasound unit.  Impression: Technically successful ultrasound guided injection.         Assessment and Plan: 52 y.o. male with chronic left shoulder pain.  His MRI showed glenohumeral DJD AC DJD and subacromial impingement.  He had injections of the [facility] joint and subacromial bursa with moderate results.  Will try glenohumeral injection today.  If this injection does not work very well next step should be consultation with orthopedic surgery.     PDMP not reviewed this encounter. Orders Placed This Encounter  Procedures   [place] LIMITED JOINT SPACE STRUCTURES UP LEFT(NO LINKED CHARGES)    Order Specific Question:   Reason for Exam (SYMPTOM  OR DIAGNOSIS REQUIRED)    Answer:   left shoulder pain    Order Specific Question:   Preferred imaging location?    Answer:   [facility]   No orders of the defined types were placed in this encounter.    Discussed warning signs or symptoms. Please see discharge instructions. Patient expresses understanding.   The above documentation has been reviewed and is accurate and complete Scott [name], M.D.

## 2022-09-13 ENCOUNTER — Ambulatory Visit (INDEPENDENT_AMBULATORY_CARE_PROVIDER_SITE_OTHER): Payer: 59 | Admitting: Emergency Medicine

## 2022-09-13 ENCOUNTER — Other Ambulatory Visit: Payer: Self-pay

## 2022-09-13 ENCOUNTER — Ambulatory Visit (INDEPENDENT_AMBULATORY_CARE_PROVIDER_SITE_OTHER): Payer: 59 | Admitting: Family Medicine

## 2022-09-13 ENCOUNTER — Encounter: Payer: Self-pay | Admitting: Emergency Medicine

## 2022-09-13 VITALS — BP 124/72 | HR 100 | Ht 66.0 in | Wt 229.0 lb

## 2022-09-13 VITALS — BP 124/72 | HR 100 | Temp 98.0°F | Ht 66.0 in | Wt 229.0 lb

## 2022-09-13 DIAGNOSIS — G8929 Other chronic pain: Secondary | ICD-10-CM

## 2022-09-13 DIAGNOSIS — E1169 Type 2 diabetes mellitus with other specified complication: Secondary | ICD-10-CM

## 2022-09-13 DIAGNOSIS — E785 Hyperlipidemia, unspecified: Secondary | ICD-10-CM

## 2022-09-13 DIAGNOSIS — E1159 Type 2 diabetes mellitus with other circulatory complications: Secondary | ICD-10-CM

## 2022-09-13 DIAGNOSIS — I152 Hypertension secondary to endocrine disorders: Secondary | ICD-10-CM | POA: Diagnosis not present

## 2022-09-13 DIAGNOSIS — I5032 Chronic diastolic (congestive) heart failure: Secondary | ICD-10-CM

## 2022-09-13 DIAGNOSIS — M25512 Pain in left shoulder: Secondary | ICD-10-CM | POA: Diagnosis not present

## 2022-09-13 DIAGNOSIS — G4733 Obstructive sleep apnea (adult) (pediatric): Secondary | ICD-10-CM

## 2022-09-13 DIAGNOSIS — Z6837 Body mass index (BMI) 37.0-37.9, adult: Secondary | ICD-10-CM

## 2022-09-13 LAB — CBC WITH DIFFERENTIAL/PLATELET
Basophils Absolute: 0 10*3/uL (ref 0.0–0.1)
Basophils Relative: 0.5 % (ref 0.0–3.0)
Eosinophils Absolute: 0 10*3/uL (ref 0.0–0.7)
Eosinophils Relative: 0.8 % (ref 0.0–5.0)
HCT: 50.8 % (ref 39.0–52.0)
Hemoglobin: 16.5 g/dL (ref 13.0–17.0)
Lymphocytes Relative: 40.3 % (ref 12.0–46.0)
Lymphs Abs: 2.3 10*3/uL (ref 0.7–4.0)
MCHC: 32.5 g/dL (ref 30.0–36.0)
MCV: 79.3 fl (ref 78.0–100.0)
Monocytes Absolute: 0.7 10*3/uL (ref 0.1–1.0)
Monocytes Relative: 11.4 % (ref 3.0–12.0)
Neutro Abs: 2.7 10*3/uL (ref 1.4–7.7)
Neutrophils Relative %: 47 % (ref 43.0–77.0)
Platelets: 245 10*3/uL (ref 150.0–400.0)
RBC: 6.41 Mil/uL — ABNORMAL HIGH (ref 4.22–5.81)
RDW: 14.9 % (ref 11.5–15.5)
WBC: 5.8 10*3/uL (ref 4.0–10.5)

## 2022-09-13 LAB — COMPREHENSIVE METABOLIC PANEL
ALT: 45 U/L (ref 0–53)
AST: 21 U/L (ref 0–37)
Albumin: 4.4 g/dL (ref 3.5–5.2)
Alkaline Phosphatase: 52 U/L (ref 39–117)
BUN: 16 mg/dL (ref 6–23)
CO2: 30 mEq/L (ref 19–32)
Calcium: 9.4 mg/dL (ref 8.4–10.5)
Chloride: 104 mEq/L (ref 96–112)
Creatinine, Ser: 1.27 mg/dL (ref 0.40–1.50)
GFR: 64.72 mL/min (ref >60.00)
Glucose, Bld: 182 mg/dL — ABNORMAL HIGH (ref 70–99)
Potassium: 4.2 mEq/L (ref 3.5–5.1)
Sodium: 140 mEq/L (ref 135–145)
Total Bilirubin: 0.5 mg/dL (ref 0.2–1.2)
Total Protein: 7.4 g/dL (ref 6.0–8.3)

## 2022-09-13 LAB — POCT GLYCOSYLATED HEMOGLOBIN (HGB A1C): Hemoglobin A1C: 7.2 % — AB (ref 4.0–5.6)

## 2022-09-13 LAB — LIPID PANEL
Cholesterol: 134 mg/dL (ref 0–200)
HDL: 44.1 mg/dL (ref >39.00)
LDL Cholesterol: 70 mg/dL (ref 0–99)
NonHDL: 90.26
Total CHOL/HDL Ratio: 3
Triglycerides: 102 mg/dL (ref 0.0–149.0)
VLDL: 20.4 mg/dL (ref 0.0–40.0)

## 2022-09-13 LAB — MICROALBUMIN / CREATININE URINE RATIO
Creatinine,U: 149.8 mg/dL
Microalb Creat Ratio: 2.7 mg/g (ref 0.0–30.0)
Microalb, Ur: 4.1 mg/dL — ABNORMAL HIGH (ref 0.0–1.9)

## 2022-09-13 MED ORDER — ATORVASTATIN CALCIUM 20 MG PO TABS: 20.0000 mg | ORAL_TABLET | Freq: Every day | ORAL | 3 refills | Status: AC

## 2022-09-13 MED ORDER — TRULICITY 1.5 MG/0.5ML ~~LOC~~ SOAJ: SUBCUTANEOUS | 7 refills | Status: DC

## 2022-09-13 NOTE — Progress Notes (Signed)
Scott [name] 52 y.o.   Chief Complaint  Patient presents with   Medical Management of Chronic Issues    f/u appt, patient wants labs done today     HISTORY OF PRESENT ILLNESS: This is a 52 y.o. male here for 76-month follow-up of diabetes and hypertension Has no complaints or medical concerns today. Lab Results  Component Value Date   HGBA1C 7.0 (A) 06/14/2022   Wt Readings from Last 3 Encounters:  09/13/22 229 lb (103.9 kg)  08/23/22 229 lb (103.9 kg)  08/19/22 228 lb (103.4 kg)     HPI   Prior to Admission medications   Medication Sig Start Date End Date Taking? Authorizing Provider  amLODipine (NORVASC) 10 MG tablet TAKE ONE TABLET BY MOUTH DAILY 03/19/22  Yes Joss Friedel, Eilleen Kempf, MD  fluticasone [facility]) 50 MCG/ACT nasal spray Place 2 sprays into both nostrils daily. Patient taking differently: Place 2 sprays into both nostrils daily as needed for allergies. 10/08/20  Yes CovingtonMaralyn Sago M, PA-C  glucose blood test strip Use as instructed. Inject into the skin once daily. E11.65 11/05/20  Yes Mayers, Cari S, PA-C  JARDIANCE 25 MG TABS tablet TAKE ONE TABLET BY MOUTH DAILY 03/22/22  Yes Dontavis Tschantz, Eilleen Kempf, MD  lisinopril (ZESTRIL) 20 MG tablet Take 1 tablet (20 mg total) by mouth daily. 02/16/22 02/11/23 Yes Fawzi Melman, Eilleen Kempf, MD  loratadine (CLARITIN) 10 MG tablet Take 10 mg by mouth daily as needed for allergies.   Yes [provider]  metFORMIN (GLUCOPHAGE) 500 MG tablet Take 1 tablet (500 mg total) by mouth 2 (two) times daily with a meal. 11/16/21 11/11/22 Yes Kimi Bordeau, Eilleen Kempf, MD  Multiple Vitamin (MULTIVITAMIN WITH MINERALS) TABS tablet Take 1 tablet by mouth daily.   Yes [provider]  tiZANidine (ZANAFLEX) 4 MG tablet TAKE ONE TABLET BY MOUTH EVERY 8 HOURS AS NEEDED FOR FOR MUSCLE SPASMS 06/22/22  Yes Rodolph Bong, MD  TRULICITY 1.5 MG/0.5ML SOPN INJECT 1.5 MG UNDER THE SKIN ONCE WEEKLY 02/13/22  Yes Loany Neuroth, Eilleen Kempf, MD   zinc gluconate 50 MG tablet Take 50 mg by mouth daily.   Yes [provider]  atorvastatin (LIPITOR) 20 MG tablet Take 1 tablet (20 mg total) by mouth daily. 09/08/21 09/03/22  Georgina Quint, MD  terbinafine (LAMISIL) 250 MG tablet Take 1 tablet (250 mg total) by mouth daily. Patient not taking: Reported on 09/13/2022 12/03/21   Vivi Barrack, DPM  trolamine salicylate (BLUE-EMU HEMP) 10 % cream Apply 1 application topically as needed for muscle pain.    [provider]    Allergies  Allergen Reactions   Pollen Extract Other (See Comments)    Patient Active Problem List   Diagnosis Date Noted   GERD (gastroesophageal reflux disease) 03/31/2021   History of colonic polyps 11/21/2020   Chronic left shoulder pain 11/20/2020   Posterior calcaneal exostosis 07/21/2020   Hyperlipidemia 04/23/2020   Class 2 severe obesity due to excess calories with serious comorbidity and body mass index (BMI) of 37.0 to 37.9 in adult [facility]) 02/13/2019   Sleep apnea 01/23/2019   Arthritis 01/23/2019   Chronic diastolic heart failure (HCC) 11/07/2016   OSA (obstructive sleep apnea) 04/18/2012   Left ventricular diastolic dysfunction, NYHA class 1/moderate LVH with concentric hypertrophy 09/03/2011   Dyslipidemia associated with type 2 diabetes mellitus (HCC) 09/02/2011   Hypertension associated with diabetes (HCC) 09/02/2011    Past Medical History:  Diagnosis Date   Arthritis  Carpal tunnel syndrome    Chronic migraine 01/23/2019   Diabetes mellitus    Diastolic heart failure (HCC)    GERD (gastroesophageal reflux disease)    Hypertension    Obesity    Pulmonary embolism (HCC) 2013   He has a history of HTN.  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 A repeat scan done in 2017 did not demonstrate evidence of emboli. Neither study demonstrated any coronary calcifications.    Shoulder pain, bilateral    Sleep apnea    was fitted for mask and  never got machine - too costly    Past Surgical History:  Procedure Laterality Date   CARPAL TUNNEL RELEASE Right 12/04/2015   Procedure: RIGHT CARPAL TUNNEL RELEASE;  Surgeon: Betha Loa, MD;  Location: [facility];  Service: Orthopedics;  Laterality: Right;  RIGHT CARPAL TUNNEL RELEASE   CARPAL TUNNEL RELEASE Right 12/2015   [facility]   CARPAL TUNNEL RELEASE Left 04/01/2016   Procedure: LEFT CARPAL TUNNEL RELEASE;  Surgeon: Betha Loa, MD;  Location: [facility];  Service: Orthopedics;  Laterality: Left;   I & D EXTREMITY Left 02/19/2021   Procedure: LEFT FOOT SPUR REMOVAL;  Surgeon: Cammy Copa, MD;  Location[facility];  Service: Orthopedics;  Laterality: Left;   KNEE ARTHROSCOPY WITH MEDIAL MENISECTOMY Left 02/19/2021   Procedure: LEFT KNEE ARTHROSCOPY, MENISCAL CYST DECOMPRESSION;  Surgeon: Cammy Copa, MD;  Location: [facility] OR;  Service: Orthopedics;  Laterality: Left;   right hand surgery     ROTATOR CUFF REPAIR Right     Social History   Socioeconomic History   Marital status: Married    Spouse name: Not on file   Number of children: Not on file   Years of education: 12+   Highest education level: Not on file  Occupational History   Occupation: real estate agent  Tobacco Use   Smoking status: Never   Smokeless tobacco: Never  Vaping Use   Vaping Use: Never used  Substance and Sexual Activity   Alcohol use: No    Alcohol/week: 0.0 standard drinks of alcohol   Drug use: No   Sexual activity: Yes  Other Topics Concern   Not on file  Social History Narrative   Lives at home.   Right-handed.   Occasional caffeine use.   Tea every other day   Social Determinants of Health   Financial Resource Strain: Not on file  Food Insecurity: Not on file  Transportation Needs: Not on file  Physical Activity: Not on file  Stress: Not on file  Social Connections: Not on file  Intimate Partner Violence: Not on file    Family History   Problem Relation Age of Onset   Hypertension Mother    Kidney disease Father    Hypertension Other    Hyperlipidemia Other    Diabetes Other      Review of Systems  Constitutional: Negative.  Negative for chills and fever.  HENT: Negative.  Negative for congestion and sore throat.   Respiratory: Negative.  Negative for cough and shortness of breath.   Cardiovascular: Negative.  Negative for chest pain and palpitations.  Gastrointestinal:  Negative for abdominal pain, nausea and vomiting.  Genitourinary: Negative.  Negative for dysuria and hematuria.  Skin: Negative.  Negative for rash.  Neurological: Negative.  Negative for dizziness and headaches.  All other systems reviewed and are negative.   Vitals:   09/13/22 1445  BP: 124/72  Pulse: 100  Temp: 98 F (36.7 C)  SpO2: 97%    Physical Exam Vitals reviewed.  Constitutional:      Appearance: Normal appearance.  HENT:     Head: Normocephalic.     Mouth/Throat:     Mouth: Mucous membranes are moist.     Pharynx: Oropharynx is clear.  Eyes:     Extraocular Movements: Extraocular movements intact.     Conjunctiva/sclera: Conjunctivae normal.     Pupils: Pupils are equal, round, and reactive to light.  Cardiovascular:     Rate and Rhythm: Normal rate and regular rhythm.     Pulses: Normal pulses.     Heart sounds: Normal heart sounds.  Pulmonary:     Effort: Pulmonary effort is normal.     Breath sounds: Normal breath sounds.  Musculoskeletal:     Cervical back: No tenderness.  Lymphadenopathy:     Cervical: No cervical adenopathy.  Skin:    General: Skin is warm and dry.     Capillary Refill: Capillary refill takes less than 2 seconds.  Neurological:     General: No focal deficit present.     Mental Status: He is alert and oriented to person, place, and time.  Psychiatric:        Mood and Affect: Mood normal.        Behavior: Behavior normal.    Results for orders placed or performed in visit on 09/13/22  (from the past 24 hour(s))  POCT HgB A1C     Status: Abnormal   Collection Time: 09/13/22  4:39 PM  Result Value Ref Range   Hemoglobin A1C 7.2 (A) 4.0 - 5.6 %   HbA1c POC (<> result, manual entry)     HbA1c, POC (prediabetic range)     HbA1c, POC (controlled diabetic range)       ASSESSMENT & PLAN: A total of 45 minutes was spent with the patient and counseling/coordination of care regarding preparing for this visit, review of most recent office visit notes, review of most recent blood work results including interpretation of today's hemoglobin A1c, review of multiple chronic medical conditions and their management, review of all medications, cardiovascular risks associated with uncontrolled diabetes, education on nutrition, review of health maintenance items, prognosis, documentation and need for follow-up.  Problem List Items Addressed This Visit       Cardiovascular and Mediastinum   Hypertension associated with diabetes (HCC) - Primary    Well-controlled hypertension Continue amlodipine 10 mg daily along with lisinopril 20 mg Hemoglobin A1c at 7.2, still not at goal. Cardiovascular risks associated with uncontrolled diabetes discussed Diet and nutrition discussed For now recommend to continue medications and improve diet and nutrition Continue metformin 500 mg twice a day.  Has been taking it erratically Continue weekly Trulicity at 1.5 mg Continue Jardiance 25 mg daily We will follow-up in 3 months      Relevant Medications   atorvastatin (LIPITOR) 20 MG tablet   Dulaglutide (TRULICITY) 1.5 MG/0.5ML SOPN   Other Relevant Orders   POCT HgB A1C (Completed)   Microalbumin / creatinine urine ratio   CBC with Differential/Platelet   Comprehensive metabolic panel   Chronic diastolic heart failure (HCC)    No signs of volume overload or congestive heart failure.      Relevant Medications   atorvastatin (LIPITOR) 20 MG tablet     Respiratory   OSA (obstructive sleep  apnea)    Well-controlled on CPAP treatment  Endocrine   Dyslipidemia associated with type 2 diabetes mellitus (HCC)    Chronic stable condition Continue atorvastatin 20 mg daily Lipid profile done today Diet and nutrition discussed Benefits of exercise discussed      Relevant Medications   atorvastatin (LIPITOR) 20 MG tablet   Dulaglutide (TRULICITY) 1.5 MG/0.5ML SOPN   Other Relevant Orders   Microalbumin / creatinine urine ratio   CBC with Differential/Platelet   Comprehensive metabolic panel   Lipid panel     Other   Class 2 severe obesity due to excess calories with serious comorbidity and body mass index (BMI) of 37.0 to 37.9 in adult [facility])    Diet and nutrition discussed Benefits of exercise discussed Advised to decrease amount of daily carbohydrate intake and daily calories and increase amount of plant-based protein in his diet.      Relevant Medications   Dulaglutide (TRULICITY) 1.5 MG/0.5ML SOPN   Patient Instructions  Diabetes Mellitus and Nutrition, Adult When you have diabetes, or diabetes mellitus, it is very important to have healthy eating habits because your blood sugar (glucose) levels are greatly affected by what you eat and drink. Eating healthy foods in the right amounts, at about the same times every day, can help you: Manage your blood glucose. Lower your risk of heart disease. Improve your blood pressure. Reach or maintain a healthy weight. What can affect my meal plan? Every person with diabetes is different, and each person has different needs for a meal plan. Your health care provider may recommend that you work with a dietitian to make a meal plan that is best for you. Your meal plan may vary depending on factors such as: The calories you need. The medicines you take. Your weight. Your blood glucose, blood pressure, and cholesterol levels. Your activity level. Other health conditions you have, such as heart or kidney disease. How do  carbohydrates affect me? Carbohydrates, also called carbs, affect your blood glucose level more than any other type of food. Eating carbs raises the amount of glucose in your blood. It is important to know how many carbs you can safely have in each meal. This is different for every person. Your dietitian can help you calculate how many carbs you should have at each meal and for each snack. How does alcohol affect me? Alcohol can cause a decrease in blood glucose (hypoglycemia), especially if you use insulin or take certain diabetes medicines by mouth. Hypoglycemia can be a life-threatening condition. Symptoms of hypoglycemia, such as sleepiness, dizziness, and confusion, are similar to symptoms of having too much alcohol. Do not drink alcohol if: Your health care provider tells you not to drink. You are pregnant, may be pregnant, or are planning to become pregnant. If you drink alcohol: Limit how much you have to: 0-1 drink a day for women. 0-2 drinks a day for men. Know how much alcohol is in your drink. In the [place] one drink equals one 12 oz bottle of beer (355 mL), one 5 oz glass of wine (148 mL), or one 1 oz glass of hard liquor (44 mL). Keep yourself hydrated with water, diet soda, or unsweetened iced tea. Keep in mind that regular soda, juice, and other mixers may contain a lot of sugar and must be counted as carbs. What are tips for following this plan?  Reading food labels Start by checking the serving size on the Nutrition Facts label of packaged foods and drinks. The number of calories and the amount of carbs, fats,  and other nutrients listed on the label are based on one serving of the item. Many items contain more than one serving per package. Check the total grams (g) of carbs in one serving. Check the number of grams of saturated fats and trans fats in one serving. Choose foods that have a low amount or none of these fats. Check the number of milligrams (mg) of salt (sodium) in  one serving. Most people should limit total sodium intake to less than 2,300 mg per day. Always check the nutrition information of foods labeled as "low-fat" or "nonfat." These foods may be higher in added sugar or refined carbs and should be avoided. Talk to your dietitian to identify your daily goals for nutrients listed on the label. Shopping Avoid buying canned, pre-made, or processed foods. These foods tend to be high in fat, sodium, and added sugar. Shop around the outside edge of the grocery store. This is where you will most often find fresh fruits and vegetables, bulk grains, fresh meats, and fresh dairy products. Cooking Use low-heat cooking methods, such as baking, instead of high-heat cooking methods, such as deep frying. Cook using healthy oils, such as olive, canola, or sunflower oil. Avoid cooking with butter, cream, or high-fat meats. Meal planning Eat meals and snacks regularly, preferably at the same times every day. Avoid going long periods of time without eating. Eat foods that are high in fiber, such as fresh fruits, vegetables, beans, and whole grains. Eat 4-6 oz (112-168 g) of lean protein each day, such as lean meat, chicken, fish, eggs, or tofu. One ounce (oz) (28 g) of lean protein is equal to: 1 oz (28 g) of meat, chicken, or fish. 1 egg.  cup (62 g) of tofu. Eat some foods each day that contain healthy fats, such as avocado, nuts, seeds, and fish. What foods should I eat? Fruits Berries. Apples. Oranges. Peaches. Apricots. Plums. Grapes. Mangoes. Papayas. Pomegranates. Kiwi. Cherries. Vegetables Leafy greens, including lettuce, spinach, kale, chard, collard greens, mustard greens, and cabbage. Beets. Cauliflower. Broccoli. Carrots. Green beans. Tomatoes. Peppers. Onions. Cucumbers. Brussels sprouts. Grains Whole grains, such as whole-wheat or whole-grain bread, crackers, tortillas, cereal, and pasta. Unsweetened oatmeal. Quinoa. Brown or wild rice. Meats and  other proteins Seafood. Poultry without skin. Lean cuts of poultry and beef. Tofu. Nuts. Seeds. Dairy Low-fat or fat-free dairy products such as milk, yogurt, and cheese. The items listed above may not be a complete list of foods and beverages you can eat and drink. Contact a dietitian for more information. What foods should I avoid? Fruits Fruits canned with syrup. Vegetables Canned vegetables. Frozen vegetables with butter or cream sauce. Grains Refined white flour and flour products such as bread, pasta, snack foods, and cereals. Avoid all processed foods. Meats and other proteins Fatty cuts of meat. Poultry with skin. Breaded or fried meats. Processed meat. Avoid saturated fats. Dairy Full-fat yogurt, cheese, or milk. Beverages Sweetened drinks, such as soda or iced tea. The items listed above may not be a complete list of foods and beverages you should avoid. Contact a dietitian for more information. Questions to ask a health care provider Do I need to meet with a certified diabetes care and education specialist? Do I need to meet with a dietitian? What number can I call if I have questions? When are the best times to check my blood glucose? Where to find more information: American Diabetes Association: diabetes.org Academy of Nutrition and Dietetics: eatright.[employer] of Diabetes and Digestive  and Kidney Diseases: StageSync.si Association of Diabetes Care & Education Specialists: diabeteseducator.org Summary It is important to have healthy eating habits because your blood sugar (glucose) levels are greatly affected by what you eat and drink. It is important to use alcohol carefully. A healthy meal plan will help you manage your blood glucose and lower your risk of heart disease. Your health care provider may recommend that you work with a dietitian to make a meal plan that is best for you. This information is not intended to replace advice given to you by your  health care provider. Make sure you discuss any questions you have with your health care provider. Document Revised: 11/21/2019 Document Reviewed: 11/21/2019 Elsevier Patient Education  2023 Elsevier Inc.     Edwina Barth, MD [facility]

## 2022-09-13 NOTE — Assessment & Plan Note (Signed)
Chronic stable condition Continue atorvastatin 20 mg daily Lipid profile done today Diet and nutrition discussed Benefits of exercise discussed

## 2022-09-13 NOTE — Assessment & Plan Note (Signed)
Well-controlled hypertension Continue amlodipine 10 mg daily along with lisinopril 20 mg Hemoglobin A1c at 7.2, still not at goal. Cardiovascular risks associated with uncontrolled diabetes discussed Diet and nutrition discussed For now recommend to continue medications and improve diet and nutrition Continue metformin 500 mg twice a day.  Has been taking it erratically Continue weekly Trulicity at 1.5 mg Continue Jardiance 25 mg daily We will follow-up in 3 months

## 2022-09-13 NOTE — Patient Instructions (Signed)
Thank you for coming in today.   Call or go to the ER if you develop a large red swollen joint with extreme pain or oozing puss.    If this does not work let me know and I will get you to surgery.   Let me know.

## 2022-09-13 NOTE — Patient Instructions (Signed)

## 2022-09-13 NOTE — Assessment & Plan Note (Signed)
Well-controlled on CPAP treatment. 

## 2022-09-13 NOTE — Assessment & Plan Note (Signed)
No signs of volume overload or congestive heart failure.

## 2022-09-13 NOTE — Assessment & Plan Note (Signed)
Diet and nutrition discussed. Benefits of exercise discussed. Advised to decrease amount of daily carbohydrate intake and daily calories and increase amount of plant-based protein in his diet 

## 2022-09-30 ENCOUNTER — Ambulatory Visit: Payer: 59 | Admitting: Family Medicine

## 2022-10-27 IMAGING — MR MR KNEE*L* W/O CM
4 of 6 series · 21 of 40 positions shown · non-contrast
Comparison: None.

CLINICAL DATA: Left knee pain for 3 months.  No known injury.

EXAM:
MRI OF THE LEFT KNEE WITHOUT CONTRAST
TECHNIQUE: Multiplanar, multisequence MR imaging of the knee was performed. No
intravenous contrast was administered.

[Series 3: T2 fat-sat · axial · 4.0mm · 0.50mm/px · z∈[-38,+57]mm · 4 of 24 slices shown (1 of 2)]
[im 1/24]
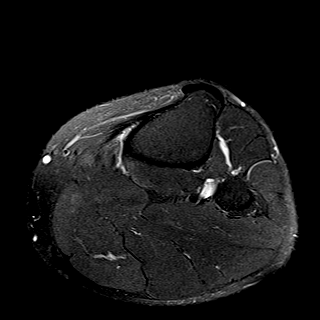
[im 4/24]
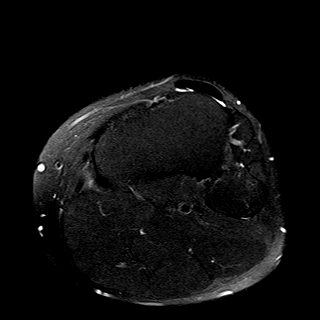
[im 12/24]
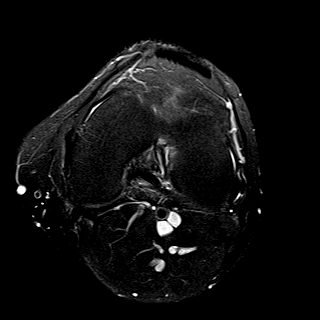
[im 20/24]
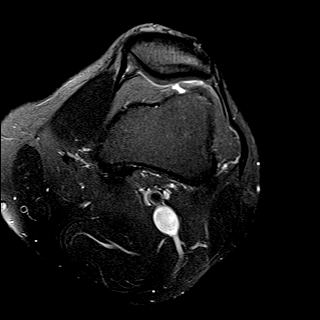

[Series 5: T2 fat-sat · coronal · 4.0mm · 0.29mm/px · 3 of 27 slices shown (2 of 2)]
[im 6/27]
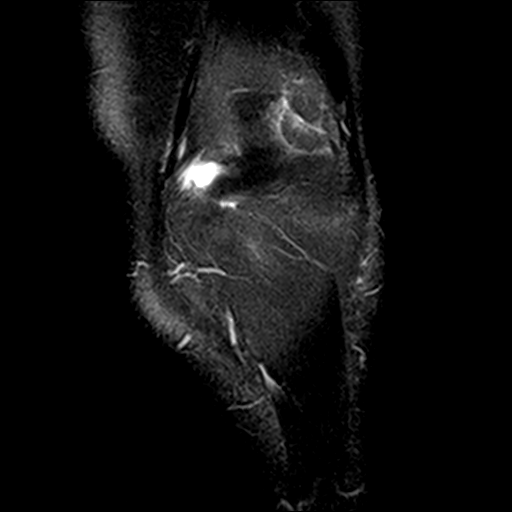
[im 16/27]
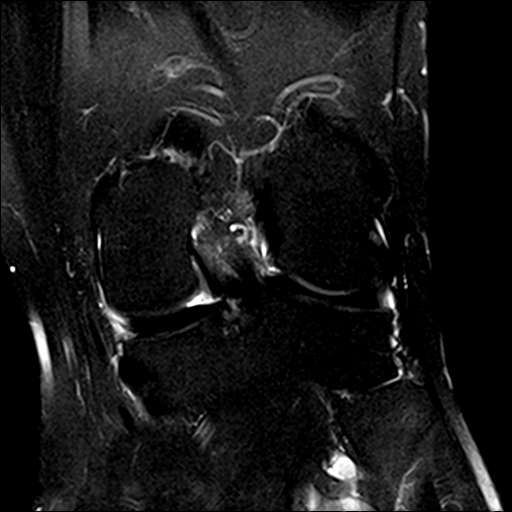
[im 27/27]
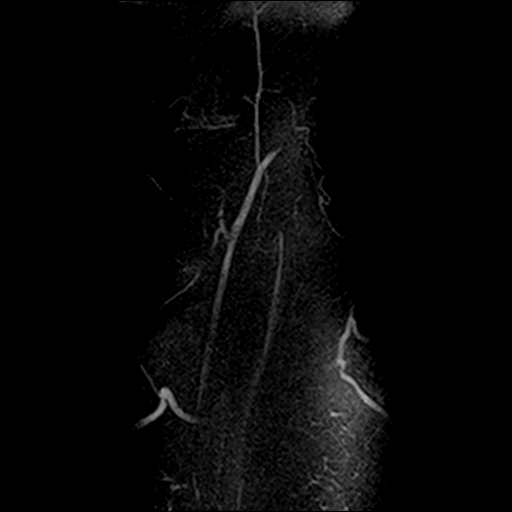

[Series 7: PD fat-sat · sagittal · 3.0mm · 0.29mm/px · 6 of 27 slices shown (1 of 2)]
[im 1/27]
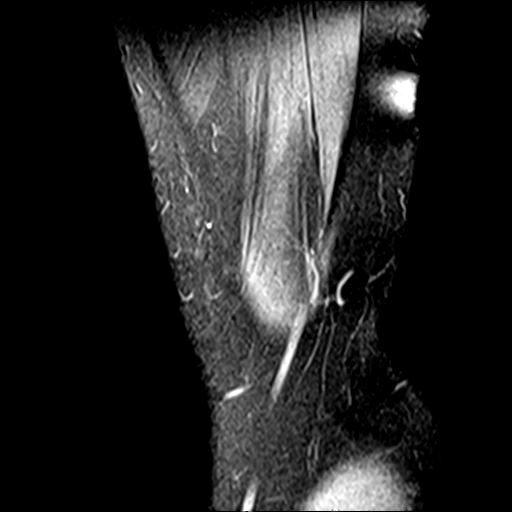
[im 6/27]
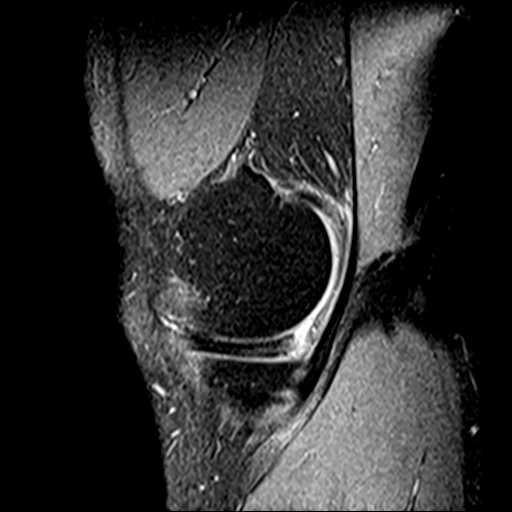
[im 11/27]
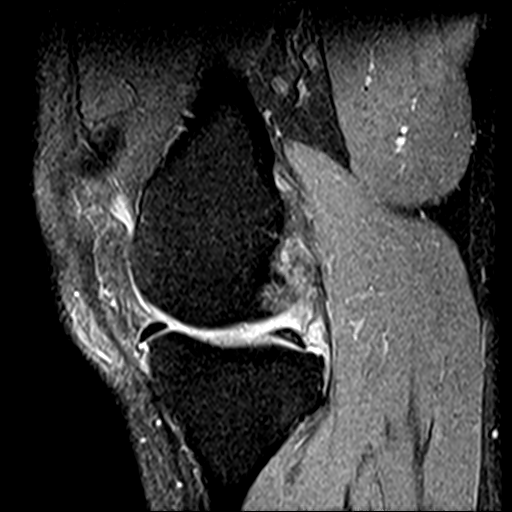
[im 16/27]
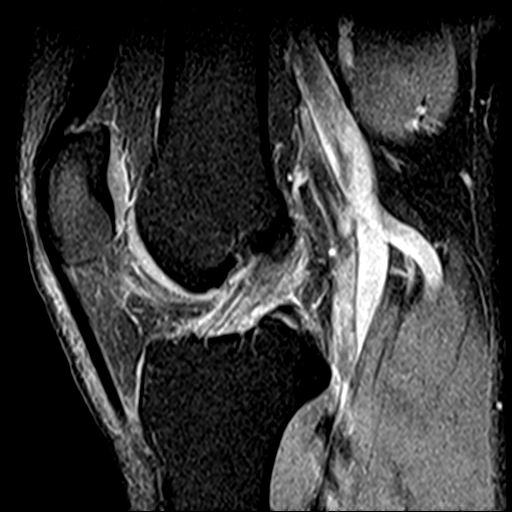
[im 21/27]
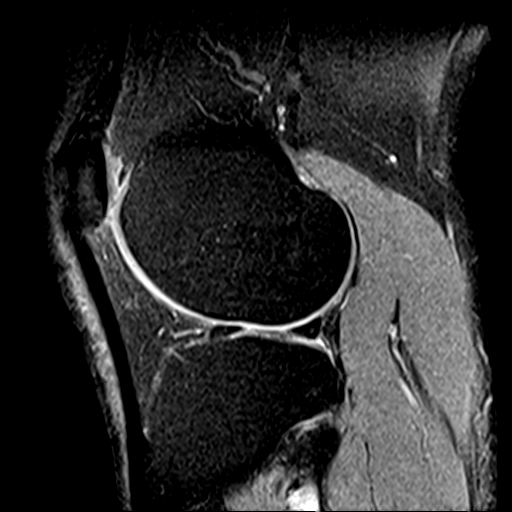
[im 27/27]
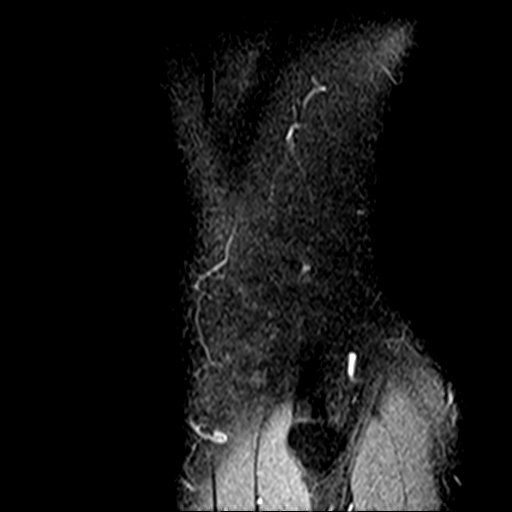

[Series 8: PD fat-sat · coronal · 3.0mm · 0.29mm/px · 8 of 35 slices shown (2 of 2)]
[im 1/35]
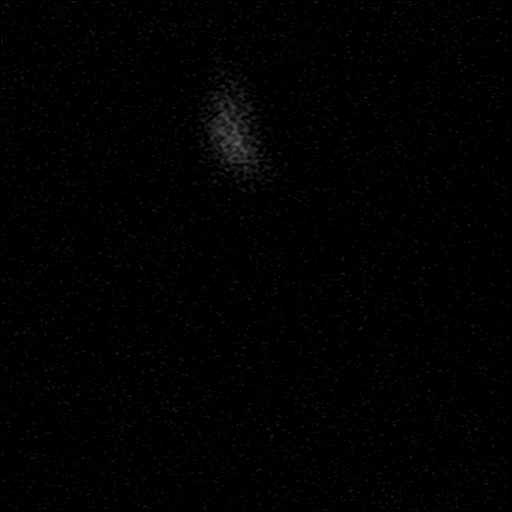
[im 5/35]
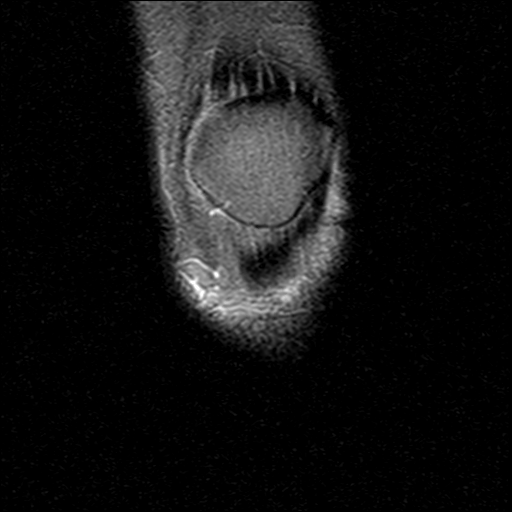
[im 10/35]
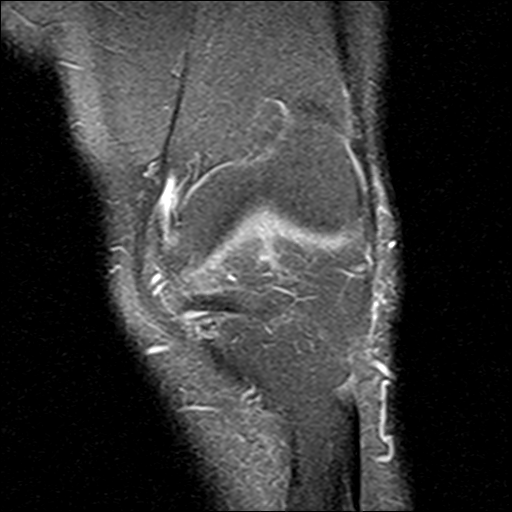
[im 15/35]
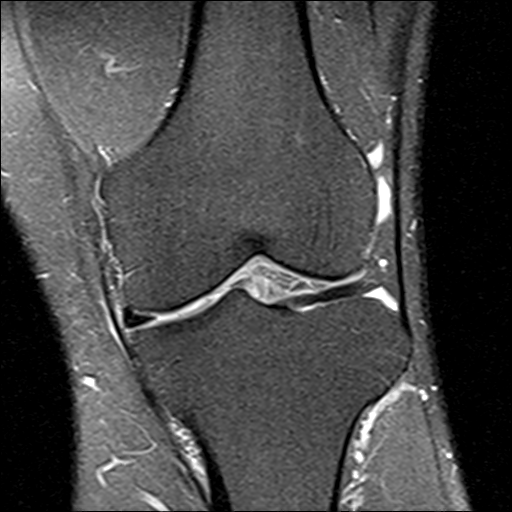
[im 20/35]
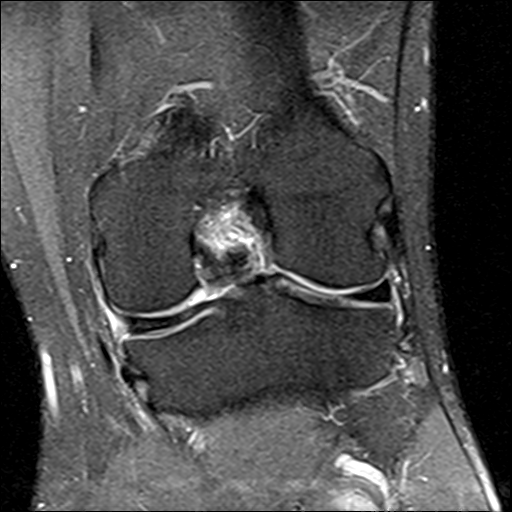
[im 25/35]
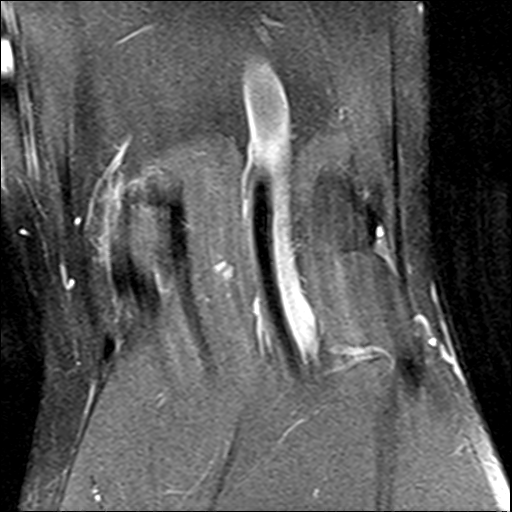
[im 30/35]
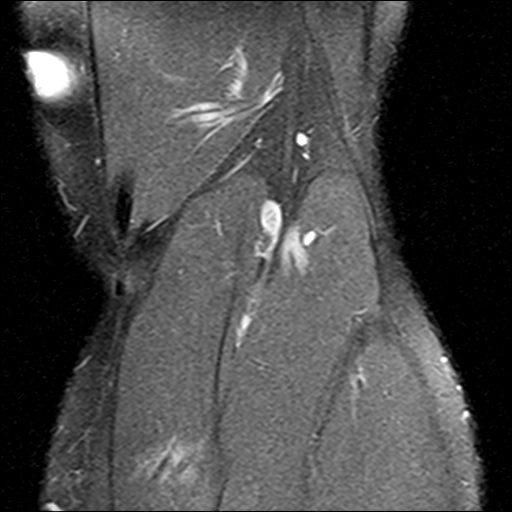
[im 35/35]
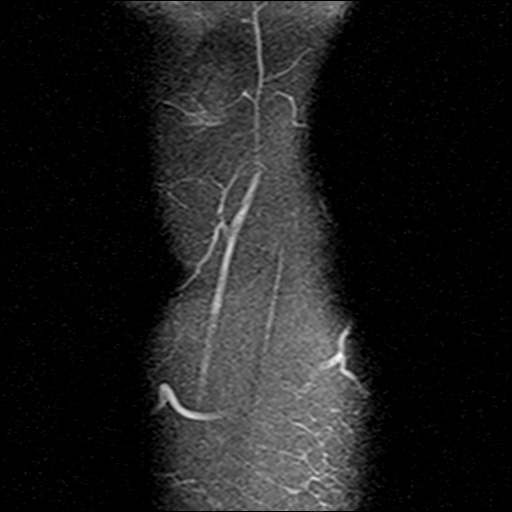

[21 of 40 positions shown; findings below may reference images not displayed]

FINDINGS: MENISCI

Medial meniscus: Intrasubstance degeneration of the medial meniscal
posterior horn without a well-defined tear. Elongated lobulated cyst
along the posterior margin of the medial meniscal posterior horn
measuring 16 x 5 x 7 mm may represent ganglion cyst or parameniscal
cyst related to occult tear.

Lateral meniscus:  Intact.

LIGAMENTS

Cruciates:  Intact ACL and PCL.

Collaterals: Intact MCL with minimal periligamentous edema. Lateral
collateral ligament complex intact.

CARTILAGE

Patellofemoral: Partial-thickness chondral surface ulceration
involving the medial patellar facet and patellar apex (series 3,
image 6). No trochlear chondral defect.

Medial: Mild chondral surface irregularity of the medial femoral
condyle.

Lateral:  No chondral defect.

Joint: No significant joint effusion. Fat pads within normal limits.

Popliteal Fossa:  No Baker cyst. Intact popliteus tendon.

Extensor Mechanism:  Intact quadriceps tendon and patellar tendon.

Bones: No focal marrow signal abnormality. No fracture or
dislocation.

Other: Minimal pes anserine bursal edema. Otherwise, no soft tissue
edema or fluid collection.
IMPRESSION: 1. Intrasubstance degeneration of the medial meniscus without a
well-defined tear. Elongated lobulated cyst along the posterior
margin of the medial meniscal posterior horn measuring 16 x 5 x 7 mm
may represent ganglion cyst or parameniscal cyst related to occult
tear.
2. Intact MCL with minimal periligamentous edema, which may reflect
a grade 1 sprain.
3. Mild medial and patellofemoral compartment chondral
irregularities, as above.

## 2022-11-18 ENCOUNTER — Other Ambulatory Visit (INDEPENDENT_AMBULATORY_CARE_PROVIDER_SITE_OTHER): Payer: 59

## 2022-11-18 ENCOUNTER — Ambulatory Visit (INDEPENDENT_AMBULATORY_CARE_PROVIDER_SITE_OTHER): Payer: 59 | Admitting: Orthopedic Surgery

## 2022-11-18 DIAGNOSIS — M79672 Pain in left foot: Secondary | ICD-10-CM

## 2022-11-18 DIAGNOSIS — M1712 Unilateral primary osteoarthritis, left knee: Secondary | ICD-10-CM

## 2022-11-19 ENCOUNTER — Other Ambulatory Visit: Payer: Self-pay

## 2022-11-19 ENCOUNTER — Ambulatory Visit: Payer: 59 | Admitting: Family Medicine

## 2022-11-19 ENCOUNTER — Encounter: Payer: Self-pay | Admitting: Family Medicine

## 2022-11-19 ENCOUNTER — Encounter: Payer: Self-pay | Admitting: Orthopedic Surgery

## 2022-11-19 VITALS — BP 122/82 | HR 98 | Wt 229.8 lb

## 2022-11-19 DIAGNOSIS — M25512 Pain in left shoulder: Secondary | ICD-10-CM

## 2022-11-19 DIAGNOSIS — G8929 Other chronic pain: Secondary | ICD-10-CM

## 2022-11-19 NOTE — Progress Notes (Signed)
   I, Stevenson Clinch, CMA acting as a scribe for Scott Graham, MD.  Scott [name] is a 52 y.o. male who presents to [facility] at [facility] today for cont'd L shoulder pain. Pt was last seen by Dr. Denyse Amass 09/13/22 and was given a L GH steroid injection and was advised if not beneficial, next step would be referral orthopedic surgery. He was given a L AC joint steroid injection on 4/22 and L subacromial steroid injection on 08/19/22.   Today, pt reports significant relief of shoulder sx after last injection. Sx have been flaring up again over the past 2.5 weeks.  Dx imaging: 08/17/22 L shoulder MRI 06/14/22 L shoulder XR  Pertinent review of systems: no fever or chills  Relevant historical information: DM   Exam:  BP 122/82   Pulse 98   Wt 229 lb 12.8 oz (104.2 kg)   SpO2 95%   BMI 37.09 kg/m  General: Well Developed, well nourished, and in no acute distress.   MSK: Left shoulder normal ROM pain with abduction    Lab and Radiology Results  Procedure: Real-time Ultrasound Guided Injection of left shoulder subacromial bursa  Device: Philips Affiniti 50G/GE Logiq Images permanently stored and available for review in PACS Verbal informed consent obtained.  Discussed risks and benefits of procedure. Warned about infection, bleeding, hyperglycemia damage to structures among others. Patient expresses understanding and agreement Time-out conducted.   Noted no overlying erythema, induration, or other signs of local infection.   Skin prepped in a sterile fashion.   Local anesthesia: Topical Ethyl chloride.   With sterile technique and under real time ultrasound guidance:  40mg  kenalog and 2ml marcaine injected into subacromial bursa. Fluid seen entering the bursa.   Completed without difficulty   Pain immediately resolved suggesting accurate placement of the medication.   Advised to call if fevers/chills, erythema, induration, drainage, or persistent bleeding.    Images permanently stored and available for review in the ultrasound unit.  Impression: Technically successful ultrasound guided injection.       Assessment and Plan: 52 y.o. 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  He has had injections in the subacromial bursa and AC joint and GH joint. [facility] joint injection worked the best and was last done 2 months ago.  Subac injection last done 3 months ago. Will repeat subac injection today and consider GH injection in 1 month.  Continue home exercises.    PDMP not reviewed this encounter. Orders Placed This Encounter  Procedures   [place] LIMITED JOINT SPACE STRUCTURES UP LEFT(NO LINKED CHARGES)    Order Specific Question:   Reason for Exam (SYMPTOM  OR DIAGNOSIS REQUIRED)    Answer:   left shoulder pain    Order Specific Question:   Preferred imaging location?    Answer:   [facility]   No orders of the defined types were placed in this encounter.    Discussed warning signs or symptoms. Please see discharge instructions. Patient expresses understanding.   The above documentation has been reviewed and is accurate and complete Scott [name], M.D.

## 2022-11-19 NOTE — Progress Notes (Unsigned)
Office Visit Note   [name]           Date of Birth: 1970-06-19           MRN: [id number] Visit Date: 11/18/2022 Requested by: Georgina Quint, MD [address] [place],  [place] [address] PCP: Georgina Quint, MD  Subjective: Chief Complaint  Patient presents with   Left Knee - Pain   Left Foot - Pain    HPI: Scott [name] is a 52 y.o. male who presents to the office reporting left knee and left heel pain.  Has known history of left knee medial compartment arthritis following partial medial meniscectomy over a year ago.  Knee pain comes and goes.  He also reports some recurrent pain around the left heel.  He had a small osteophyte removed in that region which did heal up and now he has callus over that area and some recurrent pain.  Knee pain he rates around 6 out of 10.  Last injection helped him.  He does use a brace.  Likes to walk for exercise..                ROS: All systems reviewed are negative as they relate to the chief complaint within the history of present illness.  Patient denies fevers or chills.  Assessment & Plan: Visit Diagnoses:  1. Pain in left foot     Plan: Impression is significant callus formation of her prior spur removal from the calcaneus.  Needs CT scan to evaluate for recurrence of the spur or this could just be soft tissue callus overgrowth.  Removal could be slightly risky based on his diabetes.  Left knee injection performed today.  He is heading for knee replacement but I think it is a very slow crawl at this time.  He will follow-up with [place] after that CT scan on that left calcaneus to evaluate bone spur recurrence.  Follow-Up Instructions: No follow-ups on file.   Orders:  Orders Placed This Encounter  Procedures   XR Os Calcis Left   CT FOOT LEFT WO CONTRAST   No orders of the defined types were placed in this encounter.     Procedures: Large Joint Inj: L knee on 11/18/2022 7:12 AM Indications:  diagnostic evaluation, joint swelling and pain Details: 18 G 1.5 in needle, superolateral approach  Arthrogram: No  Medications: 5 mL lidocaine 1 %; 40 mg methylPREDNISolone acetate 40 MG/ML; 4 mL bupivacaine 0.25 % Outcome: tolerated well, no immediate complications Procedure, treatment alternatives, risks and benefits explained, specific risks discussed. Consent was given by the patient. Immediately prior to procedure a time out was called to verify the correct patient, procedure, equipment, support staff and site/side marked as required. Patient was prepped and draped in the usual sterile fashion.       Clinical Data: No additional findings.  Objective: Vital Signs: There were no vitals taken for this visit.  Physical Exam:  Constitutional: Patient appears well-developed HEENT:  Head: Normocephalic Eyes:EOM are normal Neck: Normal range of motion Cardiovascular: Normal rate Pulmonary/chest: Effort normal Neurologic: Patient is alert Skin: Skin is warm Psychiatric: Patient has normal mood and affect  Ortho Exam: Ortho exam demonstrates no effusion in the left knee.  Has good range of motion from 0-1 20.  Collateral crucial ligaments are stable.  Minimal patellofemoral crepitus.  No masses lymphadenopathy or skin changes noted in that left knee region.  Left heel is examined.  Heel cord not  excessively tight.  Does have a callus formation on the side of that Achilles attachment.  No fluctuance erythema or drainage but there is just some callus formation which is raised 3 to 4 mm from the surrounding skin.  Specialty Comments:  MRI LUMBAR SPINE WITHOUT CONTRAST     TECHNIQUE:  Multiplanar, multisequence MR imaging of the lumbar spine was  performed. No intravenous contrast was administered.     COMPARISON:  Prior radiograph from 01/16/2020 and MRI from  02/05/2017.     FINDINGS:  Segmentation:  Standard.     Alignment:  Physiologic.  No listhesis.     Vertebrae:  Vertebral body height maintained without acute or chronic  fracture. Bone marrow signal intensity within normal limits. No  discrete or worrisome osseous lesions. Mild reactive marrow edema  noted about the right greater than left L4-5 facets due to facet  arthritis. No other abnormal marrow edema.     Conus medullaris and cauda equina: Conus extends to the L1 level.  Conus and cauda equina appear normal.     Paraspinal and other soft tissues: Unremarkable.     Disc levels:     L1-2:  Unremarkable.     L2-3:  Unremarkable.     L3-4: Normal interspace. Minimal facet spurring. No canal or  foraminal stenosis. No impingement.     L4-5: Disc desiccation. Shallow broad-based left subarticular disc  protrusion indents the left ventral thecal sac, encroaching upon the  left lateral recess. Associated central annular fissure. Moderate  bilateral facet hypertrophy, mildly progressed from previous.  Resultant mild canal with moderate left lateral recess stenosis,  descending L5 nerve root level. Mild to moderate bilateral L4  foraminal narrowing. Appearance is overall relatively similar to  previous.     L5-S1: Normal interspace. Moderate right with mild left facet  hypertrophy, mildly progressed from previous. No stenosis or  impingement.     IMPRESSION:  1. Shallow left subarticular disc protrusion at L4-5 with resultant  mild canal and moderate left lateral recess stenosis, potentially  affecting the descending left L5 nerve root.  2. Moderate bilateral facet hypertrophy at L4-5 and L5-S1, mildly  progressed from previous, and could contribute to lower back pain.        Electronically Signed    By: Rise Mu M.D.    On: 01/27/2020 01:46  Imaging: No results found.   PMFS History: Patient Active Problem List   Diagnosis Date Noted   GERD (gastroesophageal reflux disease) 03/31/2021   History of colonic polyps 11/21/2020   Chronic left shoulder pain 11/20/2020    Posterior calcaneal exostosis 07/21/2020   Hyperlipidemia 04/23/2020   Class 2 severe obesity due to excess calories with serious comorbidity and body mass index (BMI) of 37.0 to 37.9 in adult [facility]) 02/13/2019   Sleep apnea 01/23/2019   Arthritis 01/23/2019   Chronic diastolic heart failure (HCC) 11/07/2016   OSA (obstructive sleep apnea) 04/18/2012   Left ventricular diastolic dysfunction, NYHA class 1/moderate LVH with concentric hypertrophy 09/03/2011   Dyslipidemia associated with type 2 diabetes mellitus (HCC) 09/02/2011   Hypertension associated with diabetes (HCC) 09/02/2011   Past Medical History:  Diagnosis Date   Arthritis    Carpal tunnel syndrome    Chronic migraine 01/23/2019   Diabetes mellitus    Diastolic heart failure (HCC)    GERD (gastroesophageal reflux disease)    Hypertension    Obesity    Pulmonary embolism (HCC) 2013   He has a history of HTN.  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 A repeat scan done in 2017 did not demonstrate evidence of emboli. Neither study demonstrated any coronary calcifications.    Shoulder pain, bilateral    Sleep apnea    was fitted for mask and never got machine - too costly    Family History  Problem Relation Age of Onset   Hypertension Mother    Kidney disease Father    Hypertension Other    Hyperlipidemia Other    Diabetes Other     Past Surgical History:  Procedure Laterality Date   CARPAL TUNNEL RELEASE Right 12/04/2015   Procedure: RIGHT CARPAL TUNNEL RELEASE;  Surgeon: Betha Loa, MD;  Location: [facility];  Service: Orthopedics;  Laterality: Right;  RIGHT CARPAL TUNNEL RELEASE   CARPAL TUNNEL RELEASE Right 12/2015   [facility]   CARPAL TUNNEL RELEASE Left 04/01/2016   Procedure: LEFT CARPAL TUNNEL RELEASE;  Surgeon: Betha Loa, MD;  Location: [facility];  Service: Orthopedics;  Laterality: Left;   I & D EXTREMITY Left 02/19/2021   Procedure: LEFT FOOT  SPUR REMOVAL;  Surgeon: Cammy Copa, MD;  Location: [facility] OR;  Service: Orthopedics;  Laterality: Left;   KNEE ARTHROSCOPY WITH MEDIAL MENISECTOMY Left 02/19/2021   Procedure: LEFT KNEE ARTHROSCOPY, MENISCAL CYST DECOMPRESSION;  Surgeon: Cammy Copa, MD;  Location: [facility] OR;  Service: Orthopedics;  Laterality: Left;   right hand surgery     ROTATOR CUFF REPAIR Right    Social History   Occupational History   Occupation: real estate agent  Tobacco Use   Smoking status: Never   Smokeless tobacco: Never  Vaping Use   Vaping status: Never Used  Substance and Sexual Activity   Alcohol use: No    Alcohol/week: 0.0 standard drinks of alcohol   Drug use: No   Sexual activity: Yes

## 2022-11-19 NOTE — Patient Instructions (Addendum)
Thank you for coming in today.   You received an injection today. Seek immediate medical attention if the joint becomes red, extremely painful, or is oozing fluid.  

## 2022-11-20 MED ORDER — LIDOCAINE HCL 1 % IJ SOLN
5.0000 mL | INTRAMUSCULAR | Status: AC | PRN
Start: 2022-11-18 — End: 2022-11-18
  Administered 2022-11-18: 5 mL

## 2022-11-20 MED ORDER — METHYLPREDNISOLONE ACETATE 40 MG/ML IJ SUSP
40.0000 mg | INTRAMUSCULAR | Status: AC | PRN
Start: 2022-11-18 — End: 2022-11-18
  Administered 2022-11-18: 40 mg via INTRA_ARTICULAR

## 2022-11-20 MED ORDER — BUPIVACAINE HCL 0.25 % IJ SOLN
4.0000 mL | INTRAMUSCULAR | Status: AC | PRN
Start: 2022-11-18 — End: 2022-11-18
  Administered 2022-11-18: 4 mL via INTRA_ARTICULAR

## 2022-11-23 ENCOUNTER — Telehealth: Payer: Self-pay | Admitting: Orthopedic Surgery

## 2022-11-23 NOTE — Telephone Encounter (Signed)
Patient called. Says the MRI was denied. Thinks there wasn't enough information in the referral for them to approve it. Would like a call. [phone]

## 2022-11-24 ENCOUNTER — Other Ambulatory Visit: Payer: 59

## 2022-11-26 NOTE — Telephone Encounter (Signed)
MRI has been denied. Waiting on paper to let me know what or why it has been denied so can let provdier know.

## 2022-12-01 ENCOUNTER — Encounter (INDEPENDENT_AMBULATORY_CARE_PROVIDER_SITE_OTHER): Payer: Self-pay

## 2022-12-17 NOTE — Progress Notes (Unsigned)
   Rubin Payor, PhD, LAT, ATC acting as a scribe for [name], MD.  DUFFY BESANCON is a 52 y.o. male who presents to [facility] at [facility] today for 35-month f/u L shoulder pain. Pt was last seen by Dr. Denyse Amass on 11/19/22 and was given a L subacromial steroid injection and was advised to cont HEP.   Today, pt reports ***  Dx imaging: 08/17/22 L shoulder MRI 06/14/22 L shoulder XR  Pertinent review of systems: ***  Relevant historical information: ***   Exam:  There were no vitals taken for this visit. General: Well Developed, well nourished, and in no acute distress.   MSK: ***    Lab and Radiology Results No results found for this or any previous visit (from the past 72 hour(s)). No results found.     Assessment and Plan: 52 y.o. male with ***   PDMP not reviewed this encounter. No orders of the defined types were placed in this encounter.  No orders of the defined types were placed in this encounter.    Discussed warning signs or symptoms. Please see discharge instructions. Patient expresses understanding.   ***

## 2022-12-20 ENCOUNTER — Ambulatory Visit (INDEPENDENT_AMBULATORY_CARE_PROVIDER_SITE_OTHER): Payer: 59 | Admitting: Family Medicine

## 2022-12-20 ENCOUNTER — Other Ambulatory Visit: Payer: Self-pay

## 2022-12-20 ENCOUNTER — Encounter: Payer: Self-pay | Admitting: Family Medicine

## 2022-12-20 VITALS — BP 124/82 | HR 89 | Ht 66.0 in | Wt 230.0 lb

## 2022-12-20 DIAGNOSIS — M25512 Pain in left shoulder: Secondary | ICD-10-CM

## 2022-12-20 DIAGNOSIS — G8929 Other chronic pain: Secondary | ICD-10-CM

## 2022-12-20 NOTE — Patient Instructions (Signed)
 Thank you for coming in today.   You received an injection today. Seek immediate medical attention if the joint becomes red, extremely painful, or is oozing fluid.   I've referred you to Orthopedic Surgery.  Let us know if you don't hear from them in one week.

## 2022-12-23 ENCOUNTER — Ambulatory Visit: Payer: 59 | Admitting: Emergency Medicine

## 2023-01-05 ENCOUNTER — Ambulatory Visit (INDEPENDENT_AMBULATORY_CARE_PROVIDER_SITE_OTHER): Payer: 59 | Admitting: Orthopedic Surgery

## 2023-01-05 ENCOUNTER — Telehealth: Payer: Self-pay | Admitting: Orthopedic Surgery

## 2023-01-05 ENCOUNTER — Encounter: Payer: Self-pay | Admitting: Orthopedic Surgery

## 2023-01-05 ENCOUNTER — Other Ambulatory Visit (INDEPENDENT_AMBULATORY_CARE_PROVIDER_SITE_OTHER): Payer: 59

## 2023-01-05 DIAGNOSIS — M25511 Pain in right shoulder: Secondary | ICD-10-CM | POA: Diagnosis not present

## 2023-01-05 DIAGNOSIS — M25512 Pain in left shoulder: Secondary | ICD-10-CM

## 2023-01-05 NOTE — Telephone Encounter (Signed)
Any word on the paperwork that is supposed to come in for what insurance company needs to get MRI approved? -please advise

## 2023-01-05 NOTE — Progress Notes (Unsigned)
Office Visit Note   [name]           Date of Birth: [birth year]           MRN: [id number] Visit Date: 01/05/2023 Requested by: Rodolph Bong, MD [address] [place],  [place] [address] PCP: Georgina Quint, MD  Subjective: Chief Complaint  Patient presents with   Right Shoulder - Pain   Left Shoulder - Pain    HPI: Scott [name] is a 52 y.o. male who presents to the office reporting bilateral shoulder pain on the left for 4 months and on the right for several weeks.  Patient states that he has had pain in the left shoulder for 4 to 6 months with no known history of injury.  He is right-hand dominant.  Pain does not wake him from sleep at night.  Pain does radiate to the biceps on that left side.  Denies any numbness and tingling or neck pain.  Also reports right shoulder pain.  He feels like he may have injured that doing push-ups.  That pain is better since he stopped doing the push-ups.  Had surgery years ago on the right-hand side for distal clavicle excision and rotator cuff 4.  He also has MRI scan/CT scan on that left heel pending.  2 injections in the [facility] joint on the left have helped the symptoms..                ROS: All systems reviewed are negative as they relate to the chief complaint within the history of present illness.  Patient denies fevers or chills.  Assessment & Plan: Visit Diagnoses:  1. Bilateral shoulder pain, unspecified chronicity     Plan: Impression is bilateral shoulder pain left equal to right.  I think the left shoulder based on MRI scan review from earlier this year has AC joint arthritis.  Fairly minimal glenoid problems and cartilage problems.  Only about a 4 mm defect on that glenoid.  Rotator cuff appears intact.  I think on the right-hand side there may be some biceps tendon pathology.  Neither of which are bad enough for further intervention.  We will see him back after MRI scan on that left heel to see if there is  anything further to do about a potential recurrent spur on that side.  Follow-Up Instructions: No follow-ups on file.   Orders:  Orders Placed This Encounter  Procedures   XR Shoulder Right   No orders of the defined types were placed in this encounter.     Procedures: No procedures performed   Clinical Data: No additional findings.  Objective: Vital Signs: There were no vitals taken for this visit.  Physical Exam:  Constitutional: Patient appears well-developed HEENT:  Head: Normocephalic Eyes:EOM are normal Neck: Normal range of motion Cardiovascular: Normal rate Pulmonary/chest: Effort normal Neurologic: Patient is alert Skin: Skin is warm Psychiatric: Patient has normal mood and affect  Ortho Exam: Ortho exam demonstrates very good rotator cuff strength infraspinatus supraspinatus and subscap muscle testing on the left and right-hand side.  No restriction of external rotation bilaterally with bilateral range of motion approximately 50/95/170.  Does have AC joint tenderness on the left none on the right.  Positive O'Brien's testing on the right negative on the left.  No coarse grinding or crepitus with internal/external rotation of either shoulder at 90 degrees of abduction.  No other masses lymphadenopathy or skin changes noted in that shoulder girdle region.  Specialty Comments:  MRI LUMBAR SPINE WITHOUT CONTRAST     TECHNIQUE:  Multiplanar, multisequence MR imaging of the lumbar spine was  performed. No intravenous contrast was administered.     COMPARISON:  Prior radiograph from 01/16/2020 and MRI from  02/05/2017.     FINDINGS:  Segmentation:  Standard.     Alignment:  Physiologic.  No listhesis.     Vertebrae: Vertebral body height maintained without acute or chronic  fracture. Bone marrow signal intensity within normal limits. No  discrete or worrisome osseous lesions. Mild reactive marrow edema  noted about the right greater than left L4-5 facets due  to facet  arthritis. No other abnormal marrow edema.     Conus medullaris and cauda equina: Conus extends to the L1 level.  Conus and cauda equina appear normal.     Paraspinal and other soft tissues: Unremarkable.     Disc levels:     L1-2:  Unremarkable.     L2-3:  Unremarkable.     L3-4: Normal interspace. Minimal facet spurring. No canal or  foraminal stenosis. No impingement.     L4-5: Disc desiccation. Shallow broad-based left subarticular disc  protrusion indents the left ventral thecal sac, encroaching upon the  left lateral recess. Associated central annular fissure. Moderate  bilateral facet hypertrophy, mildly progressed from previous.  Resultant mild canal with moderate left lateral recess stenosis,  descending L5 nerve root level. Mild to moderate bilateral L4  foraminal narrowing. Appearance is overall relatively similar to  previous.     L5-S1: Normal interspace. Moderate right with mild left facet  hypertrophy, mildly progressed from previous. No stenosis or  impingement.     IMPRESSION:  1. Shallow left subarticular disc protrusion at L4-5 with resultant  mild canal and moderate left lateral recess stenosis, potentially  affecting the descending left L5 nerve root.  2. Moderate bilateral facet hypertrophy at L4-5 and L5-S1, mildly  progressed from previous, and could contribute to lower back pain.        Electronically Signed    By: Rise Mu M.D.    On: 01/27/2020 01:46  Imaging: XR Shoulder Right  Result Date: 01/05/2023 Requiring home and radiograph functional relief.  Prior distal clavicle excision has been performed.  Shoulder is located.  No acute fracture.  Acromiohumeral distance maintained.  Visualized lung fields clear    PMFS History: Patient Active Problem List   Diagnosis Date Noted   GERD (gastroesophageal reflux disease) 03/31/2021   History of colonic polyps 11/21/2020   Chronic left shoulder pain 11/20/2020   Posterior  calcaneal exostosis 07/21/2020   Hyperlipidemia 04/23/2020   Class 2 severe obesity due to excess calories with serious comorbidity and body mass index (BMI) of 37.0 to 37.9 in adult [facility]) 02/13/2019   Sleep apnea 01/23/2019   Arthritis 01/23/2019   Chronic diastolic heart failure (HCC) 11/07/2016   OSA (obstructive sleep apnea) 04/18/2012   Left ventricular diastolic dysfunction, NYHA class 1/moderate LVH with concentric hypertrophy 09/03/2011   Dyslipidemia associated with type 2 diabetes mellitus (HCC) 09/02/2011   Hypertension associated with diabetes (HCC) 09/02/2011   Past Medical History:  Diagnosis Date   Arthritis    Carpal tunnel syndrome    Chronic migraine 01/23/2019   Diabetes mellitus    Diastolic heart failure (HCC)    GERD (gastroesophageal reflux disease)    Hypertension    Obesity    Pulmonary embolism (HCC) 2013   He has a history of HTN.  He does have  a history of recurrent pulmonary emboli diagnosed in 2013. A repeat scan done in 2017 did not demonstrate evidence of emboli. Neither study demonstrated any coronary calcifications.    Shoulder pain, bilateral    Sleep apnea    was fitted for mask and never got machine - too costly    Family History  Problem Relation Age of Onset   Hypertension Mother    Kidney disease Father    Hypertension Other    Hyperlipidemia Other    Diabetes Other     Past Surgical History:  Procedure Laterality Date   CARPAL TUNNEL RELEASE Right 12/04/2015   Procedure: RIGHT CARPAL TUNNEL RELEASE;  Surgeon: Betha Loa, MD;  Location: [facility];  Service: Orthopedics;  Laterality: Right;  RIGHT CARPAL TUNNEL RELEASE   CARPAL TUNNEL RELEASE Right 12/2015   [facility]   CARPAL TUNNEL RELEASE Left 04/01/2016   Procedure: LEFT CARPAL TUNNEL RELEASE;  Surgeon: Betha Loa, MD;  Location: [facility];  Service: Orthopedics;  Laterality: Left;   I & D EXTREMITY Left 02/19/2021   Procedure: LEFT FOOT SPUR REMOVAL;   Surgeon: Cammy Copa, MD;  Location: [facility] OR;  Service: Orthopedics;  Laterality: Left;   KNEE ARTHROSCOPY WITH MEDIAL MENISECTOMY Left 02/19/2021   Procedure: LEFT KNEE ARTHROSCOPY, MENISCAL CYST DECOMPRESSION;  Surgeon: Cammy Copa, MD;  Location: [facility] OR;  Service: Orthopedics;  Laterality: Left;   right hand surgery     ROTATOR CUFF REPAIR Right    Social History   Occupational History   Occupation: real estate agent  Tobacco Use   Smoking status: Never   Smokeless tobacco: Never  Vaping Use   Vaping status: Never Used  Substance and Sexual Activity   Alcohol use: No    Alcohol/week: 0.0 standard drinks of alcohol   Drug use: No   Sexual activity: Yes

## 2023-01-11 ENCOUNTER — Ambulatory Visit
Admission: RE | Admit: 2023-01-11 | Discharge: 2023-01-11 | Disposition: A | Discharge status: Home / Self Care | Payer: 59 | Source: Ambulatory Visit | Attending: Orthopedic Surgery | Admitting: Orthopedic Surgery

## 2023-01-11 ENCOUNTER — Ambulatory Visit (INDEPENDENT_AMBULATORY_CARE_PROVIDER_SITE_OTHER): Payer: 59 | Admitting: Internal Medicine

## 2023-01-11 ENCOUNTER — Encounter: Payer: Self-pay | Admitting: Internal Medicine

## 2023-01-11 VITALS — BP 128/100 | HR 96 | Temp 98.4°F | Ht 66.0 in | Wt 230.0 lb

## 2023-01-11 DIAGNOSIS — R059 Cough, unspecified: Secondary | ICD-10-CM

## 2023-01-11 DIAGNOSIS — J069 Acute upper respiratory infection, unspecified: Secondary | ICD-10-CM

## 2023-01-11 DIAGNOSIS — Z23 Encounter for immunization: Secondary | ICD-10-CM

## 2023-01-11 DIAGNOSIS — M79672 Pain in left foot: Secondary | ICD-10-CM

## 2023-01-11 MED ORDER — PROMETHAZINE-DM 6.25-15 MG/5ML PO SYRP: 5.0000 mL | ORAL_SOLUTION | Freq: Four times a day (QID) | ORAL | 0 refills | Status: DC | PRN

## 2023-01-11 NOTE — Progress Notes (Unsigned)
   Subjective:   Patient ID: Scott [name], male    DOB: 03/07/1971, 52 y.o.   MRN: [id number]  HPI The patient is a 52 YO man coming in for head cold with cough. Started 1 week ago.   Review of Systems  Objective:  Physical Exam  Vitals:   01/11/23 1549 01/11/23 1551  BP: (!) 128/100 (!) 128/100  Pulse: 96   Temp: 98.4 F (36.9 C)   TempSrc: Oral   SpO2: 96%   Weight: 230 lb (104.3 kg)   Height: 5\' 6"  (1.676 m)   POC covid-19   Assessment & Plan:  Flu shot given at visit

## 2023-01-11 NOTE — Patient Instructions (Signed)
Try to avoid pseudoephedrine or phenylephrine.  We have sent in promethazine/dm cough medicine to use at night time.

## 2023-01-13 DIAGNOSIS — J069 Acute upper respiratory infection, unspecified: Secondary | ICD-10-CM | POA: Insufficient documentation

## 2023-01-13 LAB — POC COVID19 BINAXNOW: SARS Coronavirus 2 Ag: NEGATIVE

## 2023-01-13 NOTE — Assessment & Plan Note (Signed)
POC covid-19 testing done and negative. Suspect he could have had covid-19 and was outside window for treatment. Given flu shot as he desired and no fevers recently. Given severely high BP asked him to not ever take again any cold medication unless safe for high BP. Given cough medication as this is his most bothersome current symptom. Advised on timeline for most viral illnesses of 1-2 weeks for majority of symptoms to resolve and potential cough lasting 3-4 weeks and fatigue.

## 2023-01-20 ENCOUNTER — Telehealth: Payer: Self-pay | Admitting: *Deleted

## 2023-01-20 ENCOUNTER — Other Ambulatory Visit: Payer: Self-pay | Admitting: Emergency Medicine

## 2023-01-20 ENCOUNTER — Telehealth: Payer: Self-pay | Admitting: Emergency Medicine

## 2023-01-20 MED ORDER — SEMAGLUTIDE (1 MG/DOSE) 4 MG/3ML ~~LOC~~ SOPN: 1.0000 mg | PEN_INJECTOR | SUBCUTANEOUS | 5 refills | Status: DC

## 2023-01-20 NOTE — Telephone Encounter (Signed)
Called patient and he is seeing Sarah NP, for a VV tomorrow

## 2023-01-20 NOTE — Telephone Encounter (Signed)
New prescription for Ozempic sent to pharmacy of record today.  Thanks.

## 2023-01-20 NOTE — Telephone Encounter (Signed)
Called patient and informed him of provider recommendation.

## 2023-01-20 NOTE — Telephone Encounter (Signed)
He is only about 2 weeks into the cough so it is not uncommon for this to last up to a month. If worsening or other symptoms not improving okay for follow up visit. Otherwise recommend zyrtec and flonase and cough medicine as needed.

## 2023-01-20 NOTE — Telephone Encounter (Signed)
Patient was seen 01/11/23 by Dr. Okey Dupre for respiratory issues. He was prescribed medication to help with the cough, but he said it isn't getting better. He would like to know what is advised. Best callback is [phone].

## 2023-01-20 NOTE — Telephone Encounter (Signed)
Patient states that he wants to change from Trulicity to Ozempic due to the side effects of the Trulicity. Please advise.

## 2023-01-21 ENCOUNTER — Telehealth (INDEPENDENT_AMBULATORY_CARE_PROVIDER_SITE_OTHER): Payer: 59 | Admitting: Nurse Practitioner

## 2023-01-21 DIAGNOSIS — B9689 Other specified bacterial agents as the cause of diseases classified elsewhere: Secondary | ICD-10-CM | POA: Diagnosis not present

## 2023-01-21 DIAGNOSIS — J208 Acute bronchitis due to other specified organisms: Secondary | ICD-10-CM | POA: Diagnosis not present

## 2023-01-21 MED ORDER — AMOXICILLIN-POT CLAVULANATE 875-125 MG PO TABS
1.0000 | ORAL_TABLET | Freq: Two times a day (BID) | ORAL | 0 refills | Status: DC
Start: 2023-01-21 — End: 2023-02-18

## 2023-01-21 MED ORDER — PROMETHAZINE-DM 6.25-15 MG/5ML PO SYRP
5.0000 mL | ORAL_SOLUTION | Freq: Four times a day (QID) | ORAL | 0 refills | Status: DC | PRN
Start: 2023-01-21 — End: 2023-03-04

## 2023-01-21 NOTE — Assessment & Plan Note (Signed)
Acute Symptoms have persisted greater than 2 weeks Treat with course of Augmentin 1 tablet twice a day x 5 to 10 days Treat with promethazine dextromethorphan cough syrup as needed for cough suppression, patient educated to avoid driving or operating heavy machinery while taking the cough syrup.  He reports his understanding. Patient educated that if symptoms continue to persist despite completing Augmentin he needs to schedule an inpatient appointment for further evaluation.  He reports his understanding.

## 2023-01-21 NOTE — Progress Notes (Signed)
Established Patient Office Visit  An audio/visual tele-health visit was completed today for this patient. I connected with  Scott [name] on 01/21/23 utilizing audio/visual technology and verified that I am speaking with the correct person using two identifiers. The patient was located at their home, and I was located at the office of [facility] at [facility] during the encounter. I discussed the limitations of evaluation and management by telemedicine. The patient expressed understanding and agreed to proceed.    Subjective   Patient ID: Scott [name], male    DOB: 1971/04/22  Age: 52 y.o. MRN: [id number]  Chief Complaint  Patient presents with   Cough   Symptom onset greater than 2 weeks.  Was seen in person about 10 days ago and prescribed cough suppressant for presumed viral etiology.  Patient reports overall symptoms have persisted and not improved by much.  He reports having chills earlier in the disease course but these seem to have subsided.  Still feeling fatigued still having a cough.  He also reports 1 episode of mild chest pain from 2 days ago that resolved within a few minutes.  He had shortness of breath associated with this but reports that he thinks this was related to anxiety.  No reoccurrence of the chest pain.  He also has a headache.  Reports that he was tested for COVID and was negative for this.  Has had flu shot.     Review of Systems  Constitutional:  Positive for chills (earlier in course of illness) and malaise/fatigue. Negative for diaphoresis and fever.  Respiratory:  Positive for cough. Negative for shortness of breath and wheezing.   Cardiovascular:  Positive for chest pain (2 days ago, a day after he exercised. Lasted 5-8 minutes, neck and chest and stomach).  Gastrointestinal:  Negative for abdominal pain, diarrhea and vomiting.  Neurological:  Positive for headaches.      Objective:     There were no vitals taken for this  visit.   Physical Exam Comprehensive physical exam not completed today as office visit was conducted remotely.  Patient appears well overall, no evidence of acute distress..  Patient was alert and oriented, and appeared to have appropriate judgment.   No results found for any visits on 01/21/23.    The 10-year ASCVD risk score (Arnett DK, et al., 2019) is: 19.1%    Assessment & Plan:   Problem List Items Addressed This Visit       Respiratory   Acute bacterial bronchitis - Primary    Acute Symptoms have persisted greater than 2 weeks Treat with course of Augmentin 1 tablet twice a day x 5 to 10 days Treat with promethazine dextromethorphan cough syrup as needed for cough suppression, patient educated to avoid driving or operating heavy machinery while taking the cough syrup.  He reports his understanding. Patient educated that if symptoms continue to persist despite completing Augmentin he needs to schedule an inpatient appointment for further evaluation.  He reports his understanding.      Relevant Medications   promethazine-dextromethorphan (PROMETHAZINE-DM) 6.25-15 MG/5ML syrup   amoxicillin-clavulanate (AUGMENTIN) 875-125 MG tablet    No follow-ups on file.    Elenore Paddy, NP

## 2023-01-27 ENCOUNTER — Telehealth: Payer: Self-pay | Admitting: Orthopedic Surgery

## 2023-01-27 NOTE — Telephone Encounter (Signed)
Pt called in stating his left shoulder is still in pain and August Saucer said if if was still hurting we can go ahead and schedule surgery please advise

## 2023-01-28 NOTE — Telephone Encounter (Signed)
Done thx

## 2023-02-07 ENCOUNTER — Other Ambulatory Visit (INDEPENDENT_AMBULATORY_CARE_PROVIDER_SITE_OTHER): Payer: 59

## 2023-02-07 ENCOUNTER — Other Ambulatory Visit: Payer: Self-pay

## 2023-02-07 ENCOUNTER — Ambulatory Visit (INDEPENDENT_AMBULATORY_CARE_PROVIDER_SITE_OTHER): Payer: 59 | Admitting: Orthopedic Surgery

## 2023-02-07 DIAGNOSIS — M545 Low back pain, unspecified: Secondary | ICD-10-CM

## 2023-02-07 DIAGNOSIS — M1712 Unilateral primary osteoarthritis, left knee: Secondary | ICD-10-CM | POA: Diagnosis not present

## 2023-02-08 ENCOUNTER — Encounter: Payer: Self-pay | Admitting: Orthopedic Surgery

## 2023-02-08 NOTE — Progress Notes (Unsigned)
Office Visit Note   [name]           Date of Birth: 1970-05-21           MRN: [id number] Visit Date: 02/07/2023 Requested by: Georgina Quint, MD [address] [place],  [place] [address] PCP: Georgina Quint, MD  Subjective: Chief Complaint  Patient presents with   Other     Scan review    HPI: Scott [name] is a 52 y.o. male who presents to the office reporting multiple orthopedic complaints today.  He has had a CT scan of his left foot which shows no residual spur along the lateral aspect of that calcaneus.  He does have scar tissue in this region which is painful and does affect shoe wear.  Patient also reports continued left knee pain.  Had an injection in July which helped.  Also had an injection in February of this year.  He would like a repeat injection today.  Going on a vacation tomorrow.  Patient also is having some low back pain with severe left-sided foraminal stenosis.  Has tried an ablation with Dr. Alvester Morin which has not helped.  Last scan on MRI was 2 years ago.  He does state that his knee brace helps..                ROS: All systems reviewed are negative as they relate to the chief complaint within the history of present illness.  Patient denies fevers or chills.  Assessment & Plan: Visit Diagnoses:  1. Low back pain, unspecified back pain laterality, unspecified chronicity, unspecified whether sciatica present     Plan: Impression is left knee pain with cortisone injection performed today.  He will watch his blood glucose.  Continue with nonweightbearing quad strengthening exercises.  May need knee replacement sometime in the future but for now he is managing with injections.  Lumbar spine pain persist.  Radiographs unremarkable today.  I think that he needs to see Dr. Christell Constant to evaluate for possible surgical intervention for this nerve compression which may only require foraminotomies.  Not sure he needs a fusion but that  is difficult for nonspine surgeon to assess.  Regarding the foot I think he needs to see Dr. Lajoyce Corners so he can assess whether or not the risk-benefit ratio is in Ken's favor for removing the scar tissue and seeing if there is something that can be done to prevent that from recurring.  Do not see any recurrent spur in that area.  Regarding his left shoulder he has continued pain in the [facility] joint but did have very good relief from the [facility] joint injection.  Plan is for left shoulder arthroscopic distal clavicle excision.  Rotator cuff biceps and labrum looked intact.  The risk and benefits of that procedure are discussed include not limited to infection nerve and vessel damage incomplete pain weak as well as stiffness of the shoulder.  Patient understands and wishes to proceed.  All questions answered  Follow-Up Instructions: No follow-ups on file.   Orders:  Orders Placed This Encounter  Procedures   XR Lumbar Spine 2-3 Views   Ambulatory referral to Orthopedic Surgery   No orders of the defined types were placed in this encounter.     Procedures: Large Joint Inj: L knee on 02/07/2023 5:36 PM Indications: diagnostic evaluation, joint swelling and pain Details: 18 G 1.5 in needle, superolateral approach  Arthrogram: No  Medications: 5 mL lidocaine 1 %;  40 mg methylPREDNISolone acetate 40 MG/ML; 4 mL bupivacaine 0.25 % Outcome: tolerated well, no immediate complications Procedure, treatment alternatives, risks and benefits explained, specific risks discussed. Consent was given by the patient. Immediately prior to procedure a time out was called to verify the correct patient, procedure, equipment, support staff and site/side marked as required. Patient was prepped and draped in the usual sterile fashion.       Clinical Data: No additional findings.  Objective: Vital Signs: There were no vitals taken for this visit.  Physical Exam:  Constitutional: Patient appears well-developed HEENT:   Head: Normocephalic Eyes:EOM are normal Neck: Normal range of motion Cardiovascular: Normal rate Pulmonary/chest: Effort normal Neurologic: Patient is alert Skin: Skin is warm Psychiatric: Patient has normal mood and affect  Ortho Exam: Ortho exam demonstrates tenderness to palpation of that left AC joint compared to the right.  Rotator cuff strength is intact.  Shoulder range of motion passively is 50/95/170.  Rotator cuff strength intact on the left infraspinatus supraspinatus and subscap muscle testing.  There is a little bit of crepitus with AC joint loading particular crossarm adduction.  Patient has no nerve root tension signs 5 out of 5 ankle dorsiflexion plantarflexion quad and hamstring strength with mild paresthesias L5 distribution on the left compared to the right.  No trochanteric tenderness is noted.  Pedal pulses palpable.  Left knee has trace effusion excellent range of motion with flexion lacking about 5 degrees of full extension.  Medial greater than lateral joint line tenderness with negative patellar apprehension and nontender extensor mechanism.  Specialty Comments:  MRI LUMBAR SPINE WITHOUT CONTRAST     TECHNIQUE:  Multiplanar, multisequence MR imaging of the lumbar spine was  performed. No intravenous contrast was administered.     COMPARISON:  Prior radiograph from 01/16/2020 and MRI from  02/05/2017.     FINDINGS:  Segmentation:  Standard.     Alignment:  Physiologic.  No listhesis.     Vertebrae: Vertebral body height maintained without acute or chronic  fracture. Bone marrow signal intensity within normal limits. No  discrete or worrisome osseous lesions. Mild reactive marrow edema  noted about the right greater than left L4-5 facets due to facet  arthritis. No other abnormal marrow edema.     Conus medullaris and cauda equina: Conus extends to the L1 level.  Conus and cauda equina appear normal.     Paraspinal and other soft tissues: Unremarkable.      Disc levels:     L1-2:  Unremarkable.     L2-3:  Unremarkable.     L3-4: Normal interspace. Minimal facet spurring. No canal or  foraminal stenosis. No impingement.     L4-5: Disc desiccation. Shallow broad-based left subarticular disc  protrusion indents the left ventral thecal sac, encroaching upon the  left lateral recess. Associated central annular fissure. Moderate  bilateral facet hypertrophy, mildly progressed from previous.  Resultant mild canal with moderate left lateral recess stenosis,  descending L5 nerve root level. Mild to moderate bilateral L4  foraminal narrowing. Appearance is overall relatively similar to  previous.     L5-S1: Normal interspace. Moderate right with mild left facet  hypertrophy, mildly progressed from previous. No stenosis or  impingement.     IMPRESSION:  1. Shallow left subarticular disc protrusion at L4-5 with resultant  mild canal and moderate left lateral recess stenosis, potentially  affecting the descending left L5 nerve root.  2. Moderate bilateral facet hypertrophy at L4-5 and L5-S1, mildly  progressed  from previous, and could contribute to lower back pain.        Electronically Signed    By: Rise Mu M.D.    On: 01/27/2020 01:46  Imaging: No results found.   PMFS History: Patient Active Problem List   Diagnosis Date Noted   Acute bacterial bronchitis 01/21/2023   Viral URI 01/13/2023   GERD (gastroesophageal reflux disease) 03/31/2021   History of colonic polyps 11/21/2020   Chronic left shoulder pain 11/20/2020   Posterior calcaneal exostosis 07/21/2020   Hyperlipidemia 04/23/2020   Class 2 severe obesity due to excess calories with serious comorbidity and body mass index (BMI) of 37.0 to 37.9 in adult [facility]) 02/13/2019   Sleep apnea 01/23/2019   Arthritis 01/23/2019   Chronic diastolic heart failure (HCC) 11/07/2016   OSA (obstructive sleep apnea) 04/18/2012   Left ventricular diastolic dysfunction,  NYHA class 1/moderate LVH with concentric hypertrophy 09/03/2011   Dyslipidemia associated with type 2 diabetes mellitus (HCC) 09/02/2011   Hypertension associated with diabetes (HCC) 09/02/2011   Past Medical History:  Diagnosis Date   Arthritis    Carpal tunnel syndrome    Chronic migraine 01/23/2019   Diabetes mellitus    Diastolic heart failure (HCC)    GERD (gastroesophageal reflux disease)    Hypertension    Obesity    Pulmonary embolism (HCC) 2013   He has a history of HTN.  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 A repeat scan done in 2017 did not demonstrate evidence of emboli. Neither study demonstrated any coronary calcifications.    Shoulder pain, bilateral    Sleep apnea    was fitted for mask and never got machine - too costly    Family History  Problem Relation Age of Onset   Hypertension Mother    Kidney disease Father    Hypertension Other    Hyperlipidemia Other    Diabetes Other     Past Surgical History:  Procedure Laterality Date   CARPAL TUNNEL RELEASE Right 12/04/2015   Procedure: RIGHT CARPAL TUNNEL RELEASE;  Surgeon: Betha Loa, MD;  Location: [facility];  Service: Orthopedics;  Laterality: Right;  RIGHT CARPAL TUNNEL RELEASE   CARPAL TUNNEL RELEASE Right 12/2015   [facility]   CARPAL TUNNEL RELEASE Left 04/01/2016   Procedure: LEFT CARPAL TUNNEL RELEASE;  Surgeon: Betha Loa, MD;  Location: [facility];  Service: Orthopedics;  Laterality: Left;   I & D EXTREMITY Left 02/19/2021   Procedure: LEFT FOOT SPUR REMOVAL;  Surgeon: Cammy Copa, MD;  Location: [facility] OR;  Service: Orthopedics;  Laterality: Left;   KNEE ARTHROSCOPY WITH MEDIAL MENISECTOMY Left 02/19/2021   Procedure: LEFT KNEE ARTHROSCOPY, MENISCAL CYST DECOMPRESSION;  Surgeon: Cammy Copa, MD;  Location: [facility] OR;  Service: Orthopedics;  Laterality: Left;   right hand surgery     ROTATOR CUFF REPAIR Right    Social History    Occupational History   Occupation: real estate agent  Tobacco Use   Smoking status: Never   Smokeless tobacco: Never  Vaping Use   Vaping status: Never Used  Substance and Sexual Activity   Alcohol use: No    Alcohol/week: 0.0 standard drinks of alcohol   Drug use: No   Sexual activity: Yes

## 2023-02-09 ENCOUNTER — Telehealth: Payer: Self-pay | Admitting: Orthopedic Surgery

## 2023-02-09 NOTE — Telephone Encounter (Signed)
Patient request a new form for a handicap sticker

## 2023-02-10 MED ORDER — LIDOCAINE HCL 1 % IJ SOLN
5.0000 mL | INTRAMUSCULAR | Status: AC | PRN
Start: 2023-02-07 — End: 2023-02-07
  Administered 2023-02-07: 5 mL

## 2023-02-10 MED ORDER — METHYLPREDNISOLONE ACETATE 40 MG/ML IJ SUSP
40.0000 mg | INTRAMUSCULAR | Status: AC | PRN
Start: 2023-02-07 — End: 2023-02-07
  Administered 2023-02-07: 40 mg via INTRA_ARTICULAR

## 2023-02-10 MED ORDER — BUPIVACAINE HCL 0.25 % IJ SOLN
4.0000 mL | INTRAMUSCULAR | Status: AC | PRN
Start: 2023-02-07 — End: 2023-02-07
  Administered 2023-02-07: 4 mL via INTRA_ARTICULAR

## 2023-02-10 NOTE — Telephone Encounter (Signed)
Hmm ok for 6 mos

## 2023-02-11 NOTE — Telephone Encounter (Signed)
Put up front for patient to pick up.  

## 2023-02-13 ENCOUNTER — Other Ambulatory Visit: Payer: Self-pay | Admitting: Emergency Medicine

## 2023-02-13 DIAGNOSIS — Z794 Long term (current) use of insulin: Secondary | ICD-10-CM

## 2023-02-17 ENCOUNTER — Ambulatory Visit (INDEPENDENT_AMBULATORY_CARE_PROVIDER_SITE_OTHER): Payer: 59 | Admitting: Orthopedic Surgery

## 2023-02-17 DIAGNOSIS — M79672 Pain in left foot: Secondary | ICD-10-CM | POA: Diagnosis not present

## 2023-02-18 ENCOUNTER — Telehealth (INDEPENDENT_AMBULATORY_CARE_PROVIDER_SITE_OTHER): Payer: 59 | Admitting: Nurse Practitioner

## 2023-02-18 DIAGNOSIS — B9689 Other specified bacterial agents as the cause of diseases classified elsewhere: Secondary | ICD-10-CM | POA: Diagnosis not present

## 2023-02-18 DIAGNOSIS — J208 Acute bronchitis due to other specified organisms: Secondary | ICD-10-CM | POA: Diagnosis not present

## 2023-02-18 MED ORDER — AZITHROMYCIN 250 MG PO TABS
ORAL_TABLET | ORAL | 0 refills | Status: AC
Start: 2023-02-18 — End: 2023-02-23

## 2023-02-18 NOTE — Progress Notes (Signed)
Established Patient Office Visit  An audio/visual tele-health visit was completed today for this patient. I connected with  [name] on 02/18/23 utilizing audio/visual technology and verified that I am speaking with the correct person using two identifiers. The patient was located at their car, and I was located at the office of [facility] at [facility] during the encounter. I discussed the limitations of evaluation and management by telemedicine. The patient expressed understanding and agreed to proceed.     Subjective   Patient ID: Scott [name], male    DOB: [birth year]  Age: 52 y.o. MRN: [id number]  Chief Complaint  Patient presents with   Cough    Patient arrives for acute visit for the above.  He was seen virtually by myself about 1 month ago.  He was treated that time with course of Augmentin, but reports that he was unable to take this medication as prescribed (often only taking it once a day as opposed to twice a day).  He reports that symptoms got better initially but then started to get worse again.  Currently having congestion, sore throat, cough, headache.  Denies fever or chills.  Reports that he went on vacation and started feeling better while out of the area, but when returning symptoms came back.  Does not feel that these are allergic type symptoms when asked about allergies.  He does have allergies to pollen.    Review of Systems  Constitutional:  Negative for chills and fever.  HENT:  Positive for congestion and sore throat.   Respiratory:  Positive for cough.   Neurological:  Positive for headaches.      Objective:     There were no vitals taken for this visit.   Physical Exam Comprehensive physical exam not completed today as office visit was conducted remotely.  No acute distress noted, patient does cough during our visit.  Patient was alert and oriented, and appeared to have appropriate judgment.   No results found for any visits  on 02/18/23.    The ASCVD Risk score (Arnett DK, et al., 2019) failed to calculate for the following reasons:   The systolic blood pressure is missing    Assessment & Plan:   Problem List Items Addressed This Visit       Respiratory   Acute bacterial bronchitis - Primary    Acute Treat with course of azithromycin 500mg  on day 1, then 250 mg once a day for a total of 5 days. Patient has been scheduled for an in person evaluation with PCP next week for close monitoring and follow-up.      Relevant Medications   azithromycin (ZITHROMAX) 250 MG tablet    No follow-ups on file.    Elenore Paddy, NP

## 2023-02-18 NOTE — Assessment & Plan Note (Signed)
Acute Treat with course of azithromycin 500mg  on day 1, then 250 mg once a day for a total of 5 days. Patient has been scheduled for an in person evaluation with PCP next week for close monitoring and follow-up.

## 2023-02-22 ENCOUNTER — Encounter: Payer: Self-pay | Admitting: Orthopedic Surgery

## 2023-02-22 NOTE — Progress Notes (Signed)
Office Visit Note   [name]           Date of Birth: 1970-05-30           MRN: [id number] Visit Date: 02/17/2023              Requested by: Cammy Copa, MD [address] [place],  [place] [address] PCP: Georgina Quint, MD  Chief Complaint  Patient presents with   Left Foot - Follow-up      HPI: Patient is a 52 year old gentleman is seen for initial evaluation for left heel pain.  Patient is status post calcaneal spur removal.  Patient states that wearing certain shoes causes pain over the heel.  Assessment & Plan: Visit Diagnoses:  1. 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  No signs of infection at this time no skin breakdown.  Follow-Up Instructions: No follow-ups on file.   Ortho Exam  Patient is alert, oriented, no adenopathy, well-dressed, normal affect, normal respiratory effort. Examination patient has a palpable dorsalis pedis pulse patient has a well-healed incision over the heel.  There is callus that is pared without signs of ulceration.  With the knee extended patient has dorsiflexion 10 degrees short of neutral.  Patient's recent hemoglobin A1c this year was 7.2.  Review of the CT scan shows sclerotic edges from the area of the bony resection.  No stress fractures no destructive bony changes.  Imaging: No results found. No images are attached to the encounter.  Labs: Lab Results  Component Value Date   HGBA1C 7.2 (A) 09/13/2022   HGBA1C 7.0 (A) 06/14/2022   HGBA1C 6.9 (A) 02/16/2022     Lab Results  Component Value Date   ALBUMIN 4.4 09/13/2022   ALBUMIN 4.6 01/05/2022   ALBUMIN 4.5 09/08/2021    No results found for: "MG" No results found for: "VD25OH"  No results found for: "PREALBUMIN"    Latest Ref Rng & Units 09/13/2022    3:48 PM 01/05/2022    3:46 PM 09/08/2021    3:29 PM  CBC EXTENDED  WBC 4.0 - 10.5 K/uL 5.8  4.7  6.5   RBC  4.22 - 5.81 Mil/uL 6.41  6.37  6.11   Hemoglobin 13.0 - 17.0 g/dL 37.1  69.6  78.9   HCT 39.0 - 52.0 % 50.8  49.6  47.3   Platelets 150.0 - 400.0 K/uL 245.0  254  243.0   NEUT# 1.4 - 7.7 K/uL 2.7  2.1  3.6   Lymph# 0.7 - 4.0 K/uL 2.3  2.0  2.2      There is no height or weight on file to calculate BMI.  Orders:  No orders of the defined types were placed in this encounter.  No orders of the defined types were placed in this encounter.    Procedures: No procedures performed  Clinical Data: No additional findings.  ROS:  All other systems negative, except as noted in the HPI. Review of Systems  Objective: Vital Signs: There were no vitals taken for this visit.  Specialty Comments:  MRI LUMBAR SPINE WITHOUT CONTRAST     TECHNIQUE:  Multiplanar, multisequence MR imaging of the lumbar spine was  performed. No intravenous contrast was administered.     COMPARISON:  Prior radiograph from 01/16/2020 and MRI from  02/05/2017.     FINDINGS:  Segmentation:  Standard.     Alignment:  Physiologic.  No  listhesis.     Vertebrae: Vertebral body height maintained without acute or chronic  fracture. Bone marrow signal intensity within normal limits. No  discrete or worrisome osseous lesions. Mild reactive marrow edema  noted about the right greater than left L4-5 facets due to facet  arthritis. No other abnormal marrow edema.     Conus medullaris and cauda equina: Conus extends to the L1 level.  Conus and cauda equina appear normal.     Paraspinal and other soft tissues: Unremarkable.     Disc levels:     L1-2:  Unremarkable.     L2-3:  Unremarkable.     L3-4: Normal interspace. Minimal facet spurring. No canal or  foraminal stenosis. No impingement.     L4-5: Disc desiccation. Shallow broad-based left subarticular disc  protrusion indents the left ventral thecal sac, encroaching upon the  left lateral recess. Associated central annular fissure. Moderate  bilateral  facet hypertrophy, mildly progressed from previous.  Resultant mild canal with moderate left lateral recess stenosis,  descending L5 nerve root level. Mild to moderate bilateral L4  foraminal narrowing. Appearance is overall relatively similar to  previous.     L5-S1: Normal interspace. Moderate right with mild left facet  hypertrophy, mildly progressed from previous. No stenosis or  impingement.     IMPRESSION:  1. Shallow left subarticular disc protrusion at L4-5 with resultant  mild canal and moderate left lateral recess stenosis, potentially  affecting the descending left L5 nerve root.  2. Moderate bilateral facet hypertrophy at L4-5 and L5-S1, mildly  progressed from previous, and could contribute to lower back pain.        Electronically Signed    By: Rise Mu M.D.    On: 01/27/2020 01:46  PMFS History: Patient Active Problem List   Diagnosis Date Noted   Acute bacterial bronchitis 01/21/2023   Viral URI 01/13/2023   GERD (gastroesophageal reflux disease) 03/31/2021   History of colonic polyps 11/21/2020   Chronic left shoulder pain 11/20/2020   Posterior calcaneal exostosis 07/21/2020   Hyperlipidemia 04/23/2020   Class 2 severe obesity due to excess calories with serious comorbidity and body mass index (BMI) of 37.0 to 37.9 in adult [facility]) 02/13/2019   Sleep apnea 01/23/2019   Arthritis 01/23/2019   Chronic diastolic heart failure (HCC) 11/07/2016   OSA (obstructive sleep apnea) 04/18/2012   Left ventricular diastolic dysfunction, NYHA class 1/moderate LVH with concentric hypertrophy 09/03/2011   Dyslipidemia associated with type 2 diabetes mellitus (HCC) 09/02/2011   Hypertension associated with diabetes (HCC) 09/02/2011   Past Medical History:  Diagnosis Date   Arthritis    Carpal tunnel syndrome    Chronic migraine 01/23/2019   Diabetes mellitus    Diastolic heart failure (HCC)    GERD (gastroesophageal reflux disease)    Hypertension     Obesity    Pulmonary embolism (HCC) 2013   He has a history of HTN.  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 A repeat scan done in 2017 did not demonstrate evidence of emboli. Neither study demonstrated any coronary calcifications.    Shoulder pain, bilateral    Sleep apnea    was fitted for mask and never got machine - too costly    Family History  Problem Relation Age of Onset   Hypertension Mother    Kidney disease Father    Hypertension Other    Hyperlipidemia Other    Diabetes Other     Past Surgical  History:  Procedure Laterality Date   CARPAL TUNNEL RELEASE Right 12/04/2015   Procedure: RIGHT CARPAL TUNNEL RELEASE;  Surgeon: Betha Loa, MD;  Location: [facility];  Service: Orthopedics;  Laterality: Right;  RIGHT CARPAL TUNNEL RELEASE   CARPAL TUNNEL RELEASE Right 12/2015   [facility]   CARPAL TUNNEL RELEASE Left 04/01/2016   Procedure: LEFT CARPAL TUNNEL RELEASE;  Surgeon: Betha Loa, MD;  Location: [facility];  Service: Orthopedics;  Laterality: Left;   I & D EXTREMITY Left 02/19/2021   Procedure: LEFT FOOT SPUR REMOVAL;  Surgeon: Cammy Copa, MD;  Location: [facility] OR;  Service: Orthopedics;  Laterality: Left;   KNEE ARTHROSCOPY WITH MEDIAL MENISECTOMY Left 02/19/2021   Procedure: LEFT KNEE ARTHROSCOPY, MENISCAL CYST DECOMPRESSION;  Surgeon: Cammy Copa, MD;  Location: [facility] OR;  Service: Orthopedics;  Laterality: Left;   right hand surgery     ROTATOR CUFF REPAIR Right    Social History   Occupational History   Occupation: real estate agent  Tobacco Use   Smoking status: Never   Smokeless tobacco: Never  Vaping Use   Vaping status: Never Used  Substance and Sexual Activity   Alcohol use: No    Alcohol/week: 0.0 standard drinks of alcohol   Drug use: No   Sexual activity: Yes

## 2023-02-24 ENCOUNTER — Encounter: Payer: Self-pay | Admitting: Emergency Medicine

## 2023-02-24 ENCOUNTER — Other Ambulatory Visit: Payer: Self-pay | Admitting: Emergency Medicine

## 2023-02-24 ENCOUNTER — Ambulatory Visit: Payer: 59 | Admitting: Emergency Medicine

## 2023-02-24 DIAGNOSIS — I152 Hypertension secondary to endocrine disorders: Secondary | ICD-10-CM

## 2023-02-24 LAB — COMPREHENSIVE METABOLIC PANEL
ALT: 45 U/L (ref 0–53)
AST: 25 U/L (ref 0–37)
Albumin: 4.7 g/dL (ref 3.5–5.2)
Alkaline Phosphatase: 56 U/L (ref 39–117)
BUN: 17 mg/dL (ref 6–23)
CO2: 30 meq/L (ref 19–32)
Calcium: 9.7 mg/dL (ref 8.4–10.5)
Chloride: 105 meq/L (ref 96–112)
Creatinine, Ser: 1.25 mg/dL (ref 0.40–1.50)
GFR: 65.76 mL/min (ref >60.00)
Glucose, Bld: 186 mg/dL — ABNORMAL HIGH (ref 70–99)
Potassium: 4.2 meq/L (ref 3.5–5.1)
Sodium: 142 meq/L (ref 135–145)
Total Bilirubin: 0.7 mg/dL (ref 0.2–1.2)
Total Protein: 7.7 g/dL (ref 6.0–8.3)

## 2023-02-24 LAB — CBC WITH DIFFERENTIAL/PLATELET
Basophils Absolute: 0 10*3/uL (ref 0.0–0.1)
Basophils Relative: 0.7 % (ref 0.0–3.0)
Eosinophils Absolute: 0.1 10*3/uL (ref 0.0–0.7)
Eosinophils Relative: 1.5 % (ref 0.0–5.0)
HCT: 52.7 % — ABNORMAL HIGH (ref 39.0–52.0)
Hemoglobin: 16.8 g/dL (ref 13.0–17.0)
Lymphocytes Relative: 50.7 % — ABNORMAL HIGH (ref 12.0–46.0)
Lymphs Abs: 2.4 10*3/uL (ref 0.7–4.0)
MCHC: 31.8 g/dL (ref 30.0–36.0)
MCV: 79.7 fL (ref 78.0–100.0)
Monocytes Absolute: 0.4 10*3/uL (ref 0.1–1.0)
Monocytes Relative: 8.4 % (ref 3.0–12.0)
Neutro Abs: 1.8 10*3/uL (ref 1.4–7.7)
Neutrophils Relative %: 38.7 % — ABNORMAL LOW (ref 43.0–77.0)
Platelets: 280 10*3/uL (ref 150.0–400.0)
RBC: 6.61 Mil/uL — ABNORMAL HIGH (ref 4.22–5.81)
RDW: 14.6 % (ref 11.5–15.5)
WBC: 4.7 10*3/uL (ref 4.0–10.5)

## 2023-02-24 LAB — LIPID PANEL
Cholesterol: 133 mg/dL (ref 0–200)
HDL: 37 mg/dL — ABNORMAL LOW (ref >39.00)
LDL Cholesterol: 67 mg/dL (ref 0–99)
NonHDL: 95.97
Total CHOL/HDL Ratio: 4
Triglycerides: 145 mg/dL (ref 0.0–149.0)
VLDL: 29 mg/dL (ref 0.0–40.0)

## 2023-02-24 LAB — MICROALBUMIN / CREATININE URINE RATIO
Creatinine,U: 101.1 mg/dL
Microalb Creat Ratio: 1.5 mg/g (ref 0.0–30.0)
Microalb, Ur: 1.5 mg/dL (ref 0.0–1.9)

## 2023-02-24 LAB — HEMOGLOBIN A1C: Hgb A1c MFr Bld: 7.9 % — ABNORMAL HIGH (ref 4.6–6.5)

## 2023-03-02 ENCOUNTER — Ambulatory Visit: Payer: 59 | Admitting: Emergency Medicine

## 2023-03-02 ENCOUNTER — Encounter: Payer: Self-pay | Admitting: Emergency Medicine

## 2023-03-02 VITALS — BP 132/78 | HR 108 | Temp 98.6°F | Ht 66.0 in | Wt 233.1 lb

## 2023-03-02 DIAGNOSIS — E1169 Type 2 diabetes mellitus with other specified complication: Secondary | ICD-10-CM | POA: Diagnosis not present

## 2023-03-02 DIAGNOSIS — E1159 Type 2 diabetes mellitus with other circulatory complications: Secondary | ICD-10-CM | POA: Diagnosis not present

## 2023-03-02 DIAGNOSIS — Z794 Long term (current) use of insulin: Secondary | ICD-10-CM

## 2023-03-02 DIAGNOSIS — I10 Essential (primary) hypertension: Secondary | ICD-10-CM

## 2023-03-02 DIAGNOSIS — E66812 Obesity, class 2: Secondary | ICD-10-CM | POA: Diagnosis not present

## 2023-03-02 DIAGNOSIS — Z6837 Body mass index (BMI) 37.0-37.9, adult: Secondary | ICD-10-CM

## 2023-03-02 DIAGNOSIS — I152 Hypertension secondary to endocrine disorders: Secondary | ICD-10-CM

## 2023-03-02 DIAGNOSIS — E785 Hyperlipidemia, unspecified: Secondary | ICD-10-CM

## 2023-03-02 DIAGNOSIS — E1165 Type 2 diabetes mellitus with hyperglycemia: Secondary | ICD-10-CM

## 2023-03-02 MED ORDER — JARDIANCE 25 MG PO TABS: 25.0000 mg | ORAL_TABLET | Freq: Every day | ORAL | 3 refills | Status: DC

## 2023-03-02 MED ORDER — LISINOPRIL 20 MG PO TABS: 20.0000 mg | ORAL_TABLET | Freq: Every day | ORAL | 3 refills | Status: DC

## 2023-03-02 MED ORDER — AMLODIPINE BESYLATE 10 MG PO TABS
10.0000 mg | ORAL_TABLET | Freq: Every day | ORAL | 3 refills | Status: DC
Start: 2023-03-02 — End: 2024-02-29

## 2023-03-02 MED ORDER — SEMAGLUTIDE (2 MG/DOSE) 8 MG/3ML ~~LOC~~ SOPN
2.0000 mg | PEN_INJECTOR | SUBCUTANEOUS | 5 refills | Status: DC
Start: 2023-03-02 — End: 2023-09-15

## 2023-03-02 NOTE — Patient Instructions (Signed)

## 2023-03-02 NOTE — Assessment & Plan Note (Signed)
Diet and nutrition discussed.  Advised to decrease amount of daily carbohydrate intake and daily calories and increase amount of plant based protein in his diet 

## 2023-03-02 NOTE — Progress Notes (Signed)
Scott [name] 52 y.o.   Chief Complaint  Patient presents with   Medical Management of Chronic Issues    F/u appt, patient states he seen a provider for a cough, congestion. He states he was on antibiotic, still feel congested.      HISTORY OF PRESENT ILLNESS: This is a 52 y.o. male A1A here for follow-up of chronic medical conditions including diabetes, hypertension and dyslipidemia Has also been struggling for the past 2 weeks with an upper respiratory infection.  Just finished course of azithromycin.  Starting to feel better. No other complaints or medical concerns today Recent blood work results done last week reviewed with patient.  HPI   Prior to Admission medications   Medication Sig Start Date End Date Taking? Authorizing Provider  amLODipine (NORVASC) 10 MG tablet TAKE ONE TABLET BY MOUTH DAILY 03/19/22  Yes Karley Pho, Eilleen Kempf, MD  atorvastatin (LIPITOR) 20 MG tablet Take 1 tablet (20 mg total) by mouth daily. 09/13/22 09/08/23 Yes Townes Fuhs, Eilleen Kempf, MD  fluticasone [facility]) 50 MCG/ACT nasal spray Place 2 sprays into both nostrils daily. Patient taking differently: Place 2 sprays into both nostrils daily as needed for allergies. 10/08/20  Yes CovingtonMaralyn Sago M, PA-C  glucose blood test strip Use as instructed. Inject into the skin once daily. E11.65 11/05/20  Yes Mayers, Cari S, PA-C  JARDIANCE 25 MG TABS tablet TAKE ONE TABLET BY MOUTH DAILY 03/22/22  Yes Armari Fussell, Eilleen Kempf, MD  loratadine (CLARITIN) 10 MG tablet Take 10 mg by mouth daily as needed for allergies.   Yes [provider]  metFORMIN (GLUCOPHAGE) 500 MG tablet TAKE 1 TABLET BY MOUTH TWICE A DAY WITH A MEAL 02/13/23  Yes Joleigh Mineau, [place], MD  Semaglutide, 1 MG/DOSE, 4 MG/3ML SOPN Inject 1 mg as directed once a week. 01/20/23  Yes Mamadou Breon, Eilleen Kempf, MD  trolamine salicylate (BLUE-EMU HEMP) 10 % cream Apply 1 application topically as needed for muscle pain.   Yes [provider]   zinc gluconate 50 MG tablet Take 50 mg by mouth daily.   Yes [provider]  lisinopril (ZESTRIL) 20 MG tablet Take 1 tablet (20 mg total) by mouth daily. 02/16/22 02/11/23  Georgina Quint, MD  Multiple Vitamin (MULTIVITAMIN WITH MINERALS) TABS tablet Take 1 tablet by mouth daily. Patient not taking: Reported on 01/21/2023    [provider]  promethazine-dextromethorphan (PROMETHAZINE-DM) 6.25-15 MG/5ML syrup Take 5 mLs by mouth 4 (four) times daily as needed. Patient not taking: Reported on 03/02/2023 01/21/23   Elenore Paddy, NP  tiZANidine (ZANAFLEX) 4 MG tablet TAKE ONE TABLET BY MOUTH EVERY 8 HOURS AS NEEDED FOR FOR MUSCLE SPASMS Patient not taking: Reported on 01/21/2023 06/22/22   Rodolph Bong, MD    Allergies  Allergen Reactions   Pollen Extract Other (See Comments)    Patient Active Problem List   Diagnosis Date Noted   GERD (gastroesophageal reflux disease) 03/31/2021   History of colonic polyps 11/21/2020   Chronic left shoulder pain 11/20/2020   Hyperlipidemia 04/23/2020   Class 2 severe obesity due to excess calories with serious comorbidity and body mass index (BMI) of 37.0 to 37.9 in adult (HCC) 02/13/2019   Sleep apnea 01/23/2019   Chronic diastolic heart failure (HCC) 11/07/2016   OSA (obstructive sleep apnea) 04/18/2012   Left ventricular diastolic dysfunction, NYHA class 1/moderate LVH with concentric hypertrophy 09/03/2011   Dyslipidemia associated with type 2 diabetes mellitus (HCC) 09/02/2011   Hypertension associated with diabetes (HCC)  09/02/2011    Past Medical History:  Diagnosis Date   Arthritis    Carpal tunnel syndrome    Chronic migraine 01/23/2019   Diabetes mellitus    Diastolic heart failure (HCC)    GERD (gastroesophageal reflux disease)    Hypertension    Obesity    Pulmonary embolism (HCC) 2013   He has a history of HTN.  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 A repeat scan done in  2017 did not demonstrate evidence of emboli. Neither study demonstrated any coronary calcifications.    Shoulder pain, bilateral    Sleep apnea    was fitted for mask and never got machine - too costly    Past Surgical History:  Procedure Laterality Date   CARPAL TUNNEL RELEASE Right 12/04/2015   Procedure: RIGHT CARPAL TUNNEL RELEASE;  Surgeon: Betha Loa, MD;  Location: [facility];  Service: Orthopedics;  Laterality: Right;  RIGHT CARPAL TUNNEL RELEASE   CARPAL TUNNEL RELEASE Right 12/2015   [facility]   CARPAL TUNNEL RELEASE Left 04/01/2016   Procedure: LEFT CARPAL TUNNEL RELEASE;  Surgeon: Betha Loa, MD;  Location: [facility];  Service: Orthopedics;  Laterality: Left;   I & D EXTREMITY Left 02/19/2021   Procedure: LEFT FOOT SPUR REMOVAL;  Surgeon: Cammy Copa, MD;  Location: [facility] OR;  Service: Orthopedics;  Laterality: Left;   KNEE ARTHROSCOPY WITH MEDIAL MENISECTOMY Left 02/19/2021   Procedure: LEFT KNEE ARTHROSCOPY, MENISCAL CYST DECOMPRESSION;  Surgeon: Cammy Copa, MD;  Location: [facility] OR;  Service: Orthopedics;  Laterality: Left;   right hand surgery     ROTATOR CUFF REPAIR Right     Social History   Socioeconomic History   Marital status: Married    Spouse name: Not on file   Number of children: Not on file   Years of education: 12+   Highest education level: Not on file  Occupational History   Occupation: real estate agent  Tobacco Use   Smoking status: Never   Smokeless tobacco: Never  Vaping Use   Vaping status: Never Used  Substance and Sexual Activity   Alcohol use: No    Alcohol/week: 0.0 standard drinks of alcohol   Drug use: No   Sexual activity: Yes  Other Topics Concern   Not on file  Social History Narrative   Lives at home.   Right-handed.   Occasional caffeine use.   Tea every other day   Social Determinants of Health   Financial Resource Strain: Not on file  Food Insecurity: Not on file  Transportation  Needs: Not on file  Physical Activity: Not on file  Stress: Not on file  Social Connections: Not on file  Intimate Partner Violence: Not on file    Family History  Problem Relation Age of Onset   Hypertension Mother    Kidney disease Father    Hypertension Other    Hyperlipidemia Other    Diabetes Other      Review of Systems  Constitutional: Negative.  Negative for chills and fever.  HENT:  Positive for congestion. Negative for sore throat.   Respiratory: Negative.  Negative for cough and shortness of breath.   Cardiovascular: Negative.  Negative for chest pain and palpitations.  Gastrointestinal:  Negative for abdominal pain, diarrhea, nausea and vomiting.  Genitourinary: Negative.  Negative for dysuria and hematuria.  Skin: Negative.  Negative for rash.  Neurological: Negative.  Negative for dizziness and headaches.  All  other systems reviewed and are negative.   Today's Vitals   03/02/23 1307  BP: 132/78  Pulse: (!) 108  Temp: 98.6 F (37 C)  TempSrc: Oral  SpO2: 95%  Weight: 233 lb 2 oz (105.7 kg)  Height: 5\' 6"  (1.676 m)   Body mass index is 37.63 kg/m.   Physical Exam Vitals reviewed.  Constitutional:      Appearance: Normal appearance.  HENT:     Head: Normocephalic.     Mouth/Throat:     Mouth: Mucous membranes are moist.     Pharynx: Oropharynx is clear.  Eyes:     Extraocular Movements: Extraocular movements intact.     Pupils: Pupils are equal, round, and reactive to light.  Cardiovascular:     Rate and Rhythm: Normal rate and regular rhythm.     Pulses: Normal pulses.     Heart sounds: Normal heart sounds.  Pulmonary:     Effort: Pulmonary effort is normal.     Breath sounds: Normal breath sounds.  Musculoskeletal:     Cervical back: No tenderness.  Lymphadenopathy:     Cervical: No cervical adenopathy.  Skin:    General: Skin is warm and dry.  Neurological:     Mental Status: He is alert.  Psychiatric:        Mood and Affect: Mood  normal.        Behavior: Behavior normal.      ASSESSMENT & PLAN: A total of 45 minutes was spent with the patient and counseling/coordination of care regarding preparing for this visit, review of most recent office visit notes, review of multiple chronic medical conditions under management, review of all medications and changes made, review of most recent blood work results, cardiovascular risks associated with uncontrolled diabetes, education on nutrition, review of health maintenance items, prognosis, documentation, and need for follow-up.  Problem List Items Addressed This Visit       Cardiovascular and Mediastinum   Hypertension associated with diabetes (HCC) - Primary    BP Readings from Last 3 Encounters:  03/02/23 132/78  01/11/23 (!) 128/100  12/20/22 124/82   Lab Results  Component Value Date   HGBA1C 7.9 (H) 02/24/2023  Well-controlled diabetes Hemoglobin A1c higher than before, still not at goal Continue amlodipine 10 mg daily and lisinopril 20 mg daily Continue daily Jardiance 25 mg.  Recommend to increase dose of Ozempic to 2 mg weekly.  Continue metformin 500 mg twice a day Cardiovascular risks associated with diabetes and hypertension discussed Diet and nutrition discussed Benefits of exercise discussed       Relevant Medications   lisinopril (ZESTRIL) 20 MG tablet   JARDIANCE 25 MG TABS tablet   amLODipine (NORVASC) 10 MG tablet   Semaglutide, 2 MG/DOSE, 8 MG/3ML SOPN     Endocrine   Dyslipidemia associated with type 2 diabetes mellitus (HCC)    Chronic stable conditions but hemoglobin A1c still not at goal Continue metformin and Jardiance Increase weekly Ozempic Continue atorvastatin 20 mg daily Diet and nutrition discussed Follow-up in 3 months      Relevant Medications   lisinopril (ZESTRIL) 20 MG tablet   JARDIANCE 25 MG TABS tablet   Semaglutide, 2 MG/DOSE, 8 MG/3ML SOPN     Other   Class 2 severe obesity due to excess calories with serious  comorbidity and body mass index (BMI) of 37.0 to 37.9 in adult [facility])    Diet and nutrition discussed Advised to decrease amount of daily carbohydrate intake and daily  calories and increase amount of plant-based protein in his diet.      Relevant Medications   JARDIANCE 25 MG TABS tablet   Semaglutide, 2 MG/DOSE, 8 MG/3ML SOPN   Other Visit Diagnoses     Type 2 diabetes mellitus with hyperglycemia, with long-term current use of insulin (HCC)       Relevant Medications   lisinopril (ZESTRIL) 20 MG tablet   JARDIANCE 25 MG TABS tablet   Semaglutide, 2 MG/DOSE, 8 MG/3ML SOPN   Essential hypertension       Relevant Medications   lisinopril (ZESTRIL) 20 MG tablet   amLODipine (NORVASC) 10 MG tablet      Patient Instructions  Diabetes Mellitus and Nutrition, Adult When you have diabetes, or diabetes mellitus, it is very important to have healthy eating habits because your blood sugar (glucose) levels are greatly affected by what you eat and drink. Eating healthy foods in the right amounts, at about the same times every day, can help you: Manage your blood glucose. Lower your risk of heart disease. Improve your blood pressure. Reach or maintain a healthy weight. What can affect my meal plan? Every person with diabetes is different, and each person has different needs for a meal plan. Your health care provider may recommend that you work with a dietitian to make a meal plan that is best for you. Your meal plan may vary depending on factors such as: The calories you need. The medicines you take. Your weight. Your blood glucose, blood pressure, and cholesterol levels. Your activity level. Other health conditions you have, such as heart or kidney disease. How do carbohydrates affect me? Carbohydrates, also called carbs, affect your blood glucose level more than any other type of food. Eating carbs raises the amount of glucose in your blood. It is important to know how many carbs you can  safely have in each meal. This is different for every person. Your dietitian can help you calculate how many carbs you should have at each meal and for each snack. How does alcohol affect me? Alcohol can cause a decrease in blood glucose (hypoglycemia), especially if you use insulin or take certain diabetes medicines by mouth. Hypoglycemia can be a life-threatening condition. Symptoms of hypoglycemia, such as sleepiness, dizziness, and confusion, are similar to symptoms of having too much alcohol. Do not drink alcohol if: Your health care provider tells you not to drink. You are pregnant, may be pregnant, or are planning to become pregnant. If you drink alcohol: Limit how much you have to: 0-1 drink a day for women. 0-2 drinks a day for men. Know how much alcohol is in your drink. In the [place] one drink equals one 12 oz bottle of beer (355 mL), one 5 oz glass of wine (148 mL), or one 1 oz glass of hard liquor (44 mL). Keep yourself hydrated with water, diet soda, or unsweetened iced tea. Keep in mind that regular soda, juice, and other mixers may contain a lot of sugar and must be counted as carbs. What are tips for following this plan?  Reading food labels Start by checking the serving size on the Nutrition Facts label of packaged foods and drinks. The number of calories and the amount of carbs, fats, and other nutrients listed on the label are based on one serving of the item. Many items contain more than one serving per package. Check the total grams (g) of carbs in one serving. Check the number of grams of saturated fats and  trans fats in one serving. Choose foods that have a low amount or none of these fats. Check the number of milligrams (mg) of salt (sodium) in one serving. Most people should limit total sodium intake to less than 2,300 mg per day. Always check the nutrition information of foods labeled as "low-fat" or "nonfat." These foods may be higher in added sugar or refined carbs and  should be avoided. Talk to your dietitian to identify your daily goals for nutrients listed on the label. Shopping Avoid buying canned, pre-made, or processed foods. These foods tend to be high in fat, sodium, and added sugar. Shop around the outside edge of the grocery store. This is where you will most often find fresh fruits and vegetables, bulk grains, fresh meats, and fresh dairy products. Cooking Use low-heat cooking methods, such as baking, instead of high-heat cooking methods, such as deep frying. Cook using healthy oils, such as olive, canola, or sunflower oil. Avoid cooking with butter, cream, or high-fat meats. Meal planning Eat meals and snacks regularly, preferably at the same times every day. Avoid going long periods of time without eating. Eat foods that are high in fiber, such as fresh fruits, vegetables, beans, and whole grains. Eat 4-6 oz (112-168 g) of lean protein each day, such as lean meat, chicken, fish, eggs, or tofu. One ounce (oz) (28 g) of lean protein is equal to: 1 oz (28 g) of meat, chicken, or fish. 1 egg.  cup (62 g) of tofu. Eat some foods each day that contain healthy fats, such as avocado, nuts, seeds, and fish. What foods should I eat? Fruits Berries. Apples. Oranges. Peaches. Apricots. Plums. Grapes. Mangoes. Papayas. Pomegranates. Kiwi. Cherries. Vegetables Leafy greens, including lettuce, spinach, kale, chard, collard greens, mustard greens, and cabbage. Beets. Cauliflower. Broccoli. Carrots. Green beans. Tomatoes. Peppers. Onions. Cucumbers. Brussels sprouts. Grains Whole grains, such as whole-wheat or whole-grain bread, crackers, tortillas, cereal, and pasta. Unsweetened oatmeal. Quinoa. Brown or wild rice. Meats and other proteins Seafood. Poultry without skin. Lean cuts of poultry and beef. Tofu. Nuts. Seeds. Dairy Low-fat or fat-free dairy products such as milk, yogurt, and cheese. The items listed above may not be a complete list of foods and  beverages you can eat and drink. Contact a dietitian for more information. What foods should I avoid? Fruits Fruits canned with syrup. Vegetables Canned vegetables. Frozen vegetables with butter or cream sauce. Grains Refined white flour and flour products such as bread, pasta, snack foods, and cereals. Avoid all processed foods. Meats and other proteins Fatty cuts of meat. Poultry with skin. Breaded or fried meats. Processed meat. Avoid saturated fats. Dairy Full-fat yogurt, cheese, or milk. Beverages Sweetened drinks, such as soda or iced tea. The items listed above may not be a complete list of foods and beverages you should avoid. Contact a dietitian for more information. Questions to ask a health care provider Do I need to meet with a certified diabetes care and education specialist? Do I need to meet with a dietitian? What number can I call if I have questions? When are the best times to check my blood glucose? Where to find more information: American Diabetes Association: diabetes.org Academy of Nutrition and Dietetics: eatright.[employer] of Diabetes and Digestive and Kidney Diseases: StageSync.si Association of Diabetes Care & Education Specialists: diabeteseducator.org Summary It is important to have healthy eating habits because your blood sugar (glucose) levels are greatly affected by what you eat and drink. It is important to use alcohol carefully.  A healthy meal plan will help you manage your blood glucose and lower your risk of heart disease. Your health care provider may recommend that you work with a dietitian to make a meal plan that is best for you. This information is not intended to replace advice given to you by your health care provider. Make sure you discuss any questions you have with your health care provider. Document Revised: 11/21/2019 Document Reviewed: 11/21/2019 Elsevier Patient Education  2024 Elsevier Inc.      Edwina Barth,  MD [facility]

## 2023-03-02 NOTE — Assessment & Plan Note (Signed)
Chronic stable conditions but hemoglobin A1c still not at goal Continue metformin and Jardiance Increase weekly Ozempic Continue atorvastatin 20 mg daily Diet and nutrition discussed Follow-up in 3 months

## 2023-03-02 NOTE — Assessment & Plan Note (Signed)
BP Readings from Last 3 Encounters:  03/02/23 132/78  01/11/23 (!) 128/100  12/20/22 124/82   Lab Results  Component Value Date   HGBA1C 7.9 (H) 02/24/2023  Well-controlled diabetes Hemoglobin A1c higher than before, still not at goal Continue amlodipine 10 mg daily and lisinopril 20 mg daily Continue daily Jardiance 25 mg.  Recommend to increase dose of Ozempic to 2 mg weekly.  Continue metformin 500 mg twice a day Cardiovascular risks associated with diabetes and hypertension discussed Diet and nutrition discussed Benefits of exercise discussed

## 2023-03-04 HISTORY — PX: SHOULDER ARTHROSCOPY: SHX128

## 2023-03-09 ENCOUNTER — Ambulatory Visit (INDEPENDENT_AMBULATORY_CARE_PROVIDER_SITE_OTHER): Payer: 59 | Admitting: Orthopedic Surgery

## 2023-03-09 ENCOUNTER — Encounter: Payer: Self-pay | Admitting: Orthopedic Surgery

## 2023-03-09 ENCOUNTER — Other Ambulatory Visit (INDEPENDENT_AMBULATORY_CARE_PROVIDER_SITE_OTHER): Payer: 59

## 2023-03-09 VITALS — BP 111/77 | HR 106 | Ht 66.0 in | Wt 233.0 lb

## 2023-03-09 DIAGNOSIS — M545 Low back pain, unspecified: Secondary | ICD-10-CM | POA: Diagnosis not present

## 2023-03-09 NOTE — Progress Notes (Signed)
Orthopedic Spine Surgery Office Note  Assessment: Patient is a 52 y.o. male with chronic low back pain and periodic left lateral leg pain.  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  Referral provided to him today to [facility] imaging -If he gets relief with 2 injections, would consider trying ablation since usually people get longer relief with that treatment -Told him that if his radiating left leg pain gets worse, microdiscectomy may be a treatment option for him -Patient would need an A1c of 7.5 or less prior to any elective spine surgery -Patient should return to office in 4 weeks, x-rays at next visit: None   Patient expressed understanding of the plan and all questions were answered to the patient's satisfaction.   ___________________________________________________________________________   History:  Patient is a 52 y.o. male who presents today for lumbar spine.  Patient has had over 2 years of low back pain and pain that radiates into his left lower extremity.  He notes the pain is worse if he is active or walking for longer period of time.  It tends to get better if he rests.  He feels the pain in his lower lumbar region from the midline spreading out into the paraspinal region.  If he is walking for a long time he will get leg pain as well.  He feels a goes in the lateral aspect of his left thigh and leg.  This pain is not as bothersome as his back pain.  It is not as constant his back pain either.  He does not have any pain radiating into his right lower extremity.  He does not recall any trauma or injury that preceded the onset of pain.  Patient currently rates the pain as a 7 out of 10.  He states that he got over 90% relief of  his prior injections and that they last a little over 3 months.   Weakness: Denies Symptoms of imbalance: Denies Paresthesias and numbness: Denies Bowel or bladder incontinence: Denies Saddle anesthesia: Denies  Treatments tried: Tylenol, activity modification, facet injections, facet ablations  Review of systems: Denies fevers and chills, night sweats, unexplained weight loss, history of cancer, pain that wakes them at night  Past medical history: Chronic migraines HTN GERD History of PE OSA DM (last A1c was 7.9 on 02/24/2023)  Allergies: NKDA  Past surgical history:  Bilateral carpal tunnel release Left foot spur removal Left knee partial medial meniscectomy Right rotator cuff repair  Social history: Denies use of nicotine product (smoking, vaping, patches, smokeless) Alcohol use: Denies Denies recreational drug use   Physical Exam:  BMI of 37.6  General: no acute distress, appears stated age Neurologic: alert, answering questions appropriately, following commands Respiratory: unlabored breathing on room air, symmetric chest rise Psychiatric: appropriate affect, normal cadence to speech   MSK (spine):  -Strength exam      Left  Right EHL    5/5  5/5 TA    5/5  5/5 GSC    5/5  5/5 Knee extension  5/5  5/5 Hip flexion   5/5  5/5  -Sensory exam    Sensation intact to light touch in L3-S1 nerve distributions of bilateral lower extremities  -Achilles DTR: 1/4 on the left, 1/4 on the right -Patellar tendon DTR: 1/4  on the left, 1/4 on the right  -Straight leg raise: Negative bilaterally -Femoral nerve stretch test: Negative bilaterally -Clonus: no beats bilaterally  -Left hip exam: No pain through range of motion, negative Stinchfield, negative FABER, negative SI joint compression test -Right hip exam: No pain through range of motion, negative Stinchfield, negative FABER, negative SI joint compression test  Imaging: XRs of the lumbar spine from  03/09/2023 was independently reviewed and interpreted, showing no significant degenerative changes. No fracture or dislocation seen. No evidence of instability on flexion/extension views.   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 No other stenosis seen. Has facet arthropathy at L4/5.    Patient name: Scott [name] Patient MRN: [id number] Date of visit: 03/09/23

## 2023-03-09 NOTE — Pre-Procedure Instructions (Signed)
Surgical Instructions   Your procedure is scheduled on March 17, 2023. Report to [facility] Main Entrance "A" at 9:00 A.M., then check in with the Admitting office. Any questions or running late day of surgery: call [phone]  Questions prior to your surgery date: call [phone], Monday-Friday, 8am-4pm. If you experience any cold or flu symptoms such as cough, fever, chills, shortness of breath, etc. between now and your scheduled surgery, please notify us at the above number.     Remember:  Do not eat after midnight the night before your surgery  You may drink clear liquids until 8:00 AM the morning of your surgery.   Clear liquids allowed are: Water, Non-Citrus Juices (without pulp), Carbonated Beverages, Clear Tea (no milk, honey, etc.), Black Coffee Only (NO MILK, CREAM OR POWDERED CREAMER of any kind), and Gatorade.  Patient Instructions  The night before surgery:  No food after midnight. ONLY clear liquids after midnight  The day of surgery (if you have diabetes): Drink ONE (1) 12 oz G2 given to you in your pre admission testing appointment by 8:00 AM the morning of surgery. Drink in one sitting. Do not sip.  This drink was given to you during your hospital  pre-op appointment visit.  Nothing else to drink after completing the  12 oz bottle of G2.         If you have questions, please contact your surgeon's office.     Take these medicines the morning of surgery with A SIP OF WATER: amLODipine (NORVASC)  atorvastatin (LIPITOR)    May take these medicines IF NEEDED: fluticasone (FLONASE) nasal spray  loratadine (CLARITIN)    One week prior to surgery, STOP taking any Aspirin (unless otherwise instructed by your surgeon) Aleve, Naproxen, Ibuprofen, Motrin, Advil, Goody's, BC's, all herbal medications, fish oil, and non-prescription vitamins.   WHAT DO I DO ABOUT MY DIABETES MEDICATION?   Do not take metFORMIN (GLUCOPHAGE) the morning of surgery.  STOP  taking JARDIANCE three days prior to surgery. Your last dose will be November 10th.     STOP taking Semaglutide one week prior to surgery. DO NOT take any doses after November 6th.   HOW TO MANAGE YOUR DIABETES BEFORE AND AFTER SURGERY  Why is it important to control my blood sugar before and after surgery? Improving blood sugar levels before and after surgery helps healing and can limit problems. A way of improving blood sugar control is eating a healthy diet by:  Eating less sugar and carbohydrates  Increasing activity/exercise  Talking with your doctor about reaching your blood sugar goals High blood sugars (greater than 180 mg/dL) can raise your risk of infections and slow your recovery, so you will need to focus on controlling your diabetes during the weeks before surgery. Make sure that the doctor who takes care of your diabetes knows about your planned surgery including the date and location.  How do I manage my blood sugar before surgery? Check your blood sugar at least 4 times a day, starting 2 days before surgery, to make sure that the level is not too high or low.  Check your blood sugar the morning of your surgery when you wake up and every 2 hours until you get to the Short Stay unit.  If your blood sugar is less than 70 mg/dL, you will need to treat for low blood sugar: Do not take insulin. Treat a low blood sugar (less than 70 mg/dL) with  cup of clear juice (cranberry or  apple), 4 glucose tablets, OR glucose gel. Recheck blood sugar in 15 minutes after treatment (to make sure it is greater than 70 mg/dL). If your blood sugar is not greater than 70 mg/dL on recheck, call [phone] for further instructions. Report your blood sugar to the short stay nurse when you get to Short Stay.  If you are admitted to the hospital after surgery: Your blood sugar will be checked by the staff and you will probably be given insulin after surgery (instead of oral diabetes medicines) to  make sure you have good blood sugar levels. The goal for blood sugar control after surgery is 80-180 mg/dL.                      Do NOT Smoke (Tobacco/Vaping) for 24 hours prior to your procedure.  If you use a CPAP at night, you may bring your mask/headgear for your overnight stay.   You will be asked to remove any contacts, glasses, piercing's, hearing aid's, dentures/partials prior to surgery. Please bring cases for these items if needed.    Patients discharged the day of surgery will not be allowed to drive home, and someone needs to stay with them for 24 hours.  SURGICAL WAITING ROOM VISITATION Patients may have no more than 2 support people in the waiting area - these visitors may rotate.   Pre-op nurse will coordinate an appropriate time for 1 ADULT support person, who may not rotate, to accompany patient in pre-op.  Children under the age of 7 must have an adult with them who is not the patient and must remain in the main waiting area with an adult.  If the patient needs to stay at the hospital during part of their recovery, the visitor guidelines for inpatient rooms apply.  Please refer to the [facility] website for the visitor guidelines for any additional information.   If you received a COVID test during your pre-op visit  it is requested that you wear a mask when out in public, stay away from anyone that may not be feeling well and notify your surgeon if you develop symptoms. If you have been in contact with anyone that has tested positive in the last 10 days please notify you surgeon.      Pre-operative 5 CHG Bathing Instructions   You can play a key role in reducing the risk of infection after surgery. Your skin needs to be as free of germs as possible. You can reduce the number of germs on your skin by washing with CHG (chlorhexidine gluconate) soap before surgery. CHG is an antiseptic soap that kills germs and continues to kill germs even after washing.   DO NOT use  if you have an allergy to chlorhexidine/CHG or antibacterial soaps. If your skin becomes reddened or irritated, stop using the CHG and notify one of our RNs at [phone].   Please shower with the CHG soap starting 4 days before surgery using the following schedule:     Please keep in mind the following:  DO NOT shave, including legs and underarms, starting the day of your first shower.   You may shave your face at any point before/day of surgery.  Place clean sheets on your bed the day you start using CHG soap. Use a clean washcloth (not used since being washed) for each shower. DO NOT sleep with pets once you start using the CHG.   CHG Shower Instructions:  Oncologist and private area with  normal soap. If you choose to wash your hair, wash first with your normal shampoo.  After you use shampoo/soap, rinse your hair and body thoroughly to remove shampoo/soap residue.  Turn the water OFF and apply about 3 tablespoons (45 ml) of CHG soap to a CLEAN washcloth.  Apply CHG soap ONLY FROM YOUR NECK DOWN TO YOUR TOES (washing for 3-5 minutes)  DO NOT use CHG soap on face, private areas, open wounds, or sores.  Pay special attention to the area where your surgery is being performed.  If you are having back surgery, having someone wash your back for you may be helpful. Wait 2 minutes after CHG soap is applied, then you may rinse off the CHG soap.  Pat dry with a clean towel  Put on clean clothes/pajamas   If you choose to wear lotion, please use ONLY the CHG-compatible lotions on the back of this paper.   Additional instructions for the day of surgery: DO NOT APPLY any lotions, deodorants, cologne, or perfumes.   Do not bring valuables to the hospital. [facility] is not responsible for any belongings/valuables. Do not wear nail polish, gel polish, artificial nails, or any other type of covering on natural nails (fingers and toes) Do not wear jewelry or makeup Put on clean/comfortable  clothes.  Please brush your teeth.  Ask your nurse before applying any prescription medications to the skin.     CHG Compatible Lotions   Aveeno Moisturizing lotion  Cetaphil Moisturizing Cream  Cetaphil Moisturizing Lotion  Clairol Herbal Essence Moisturizing Lotion, Dry Skin  Clairol Herbal Essence Moisturizing Lotion, Extra Dry Skin  Clairol Herbal Essence Moisturizing Lotion, Normal Skin  Curel Age Defying Therapeutic Moisturizing Lotion with Alpha Hydroxy  Curel Extreme Care Body Lotion  Curel Soothing Hands Moisturizing Hand Lotion  Curel Therapeutic Moisturizing Cream, Fragrance-Free  Curel Therapeutic Moisturizing Lotion, Fragrance-Free  Curel Therapeutic Moisturizing Lotion, Original Formula  Eucerin Daily Replenishing Lotion  Eucerin Dry Skin Therapy Plus Alpha Hydroxy Crme  Eucerin Dry Skin Therapy Plus Alpha Hydroxy Lotion  Eucerin Original Crme  Eucerin Original Lotion  Eucerin Plus Crme Eucerin Plus Lotion  Eucerin TriLipid Replenishing Lotion  Keri Anti-Bacterial Hand Lotion  Keri Deep Conditioning Original Lotion Dry Skin Formula Softly Scented  Keri Deep Conditioning Original Lotion, Fragrance Free Sensitive Skin Formula  Keri Lotion Fast Absorbing Fragrance Free Sensitive Skin Formula  Keri Lotion Fast Absorbing Softly Scented Dry Skin Formula  Keri Original Lotion  Keri Skin Renewal Lotion Keri Silky Smooth Lotion  Keri Silky Smooth Sensitive Skin Lotion  Nivea Body Creamy Conditioning Oil  Nivea Body Extra Enriched Lotion  Nivea Body Original Lotion  Nivea Body Sheer Moisturizing Lotion Nivea Crme  Nivea Skin Firming Lotion  NutraDerm 30 Skin Lotion  NutraDerm Skin Lotion  NutraDerm Therapeutic Skin Cream  NutraDerm Therapeutic Skin Lotion  ProShield Protective Hand Cream  Provon moisturizing lotion   [place]- Preparing for Total Shoulder Arthroplasty  Before surgery, you can play an important role. Because skin is not sterile, your skin  needs to be as free of germs as possible. You can reduce the number of germs on your skin by using the following products.   Benzoyl Peroxide Gel  o Reduces the number of germs present on the skin  o Applied twice a day to shoulder area starting two days before surgery   Chlorhexidine Gluconate (CHG) Soap (instructions listed above on how to wash with CHG Soap)  o An antiseptic cleaner that kills germs  and bonds with the skin to continue killing germs even after washing  o Used for showering the night before surgery and morning of surgery   ==================================================================  Please follow these instructions carefully:  BENZOYL PEROXIDE 5% GEL  Please do not use if you have an allergy to benzoyl peroxide. If your skin becomes reddened/irritated stop using the benzoyl peroxide.  Starting two days before surgery, apply as follows:  1. Apply benzoyl peroxide in the morning and at night. Apply after taking a shower. If you are not taking a shower clean entire shoulder front, back, and side along with the armpit with a clean wet washcloth.  2. Place a quarter-sized dollop on your SHOULDER and rub in thoroughly, making sure to cover the front, back, and side of your shoulder, along with the armpit.   2 Days prior to Surgery First Dose on _____________ Morning Second Dose on ______________ Night  Day Before Surgery First Dose on ______________ Morning Night before surgery wash (entire body except face and private areas) with CHG Soap THEN Second Dose on ____________ Night   Morning of Surgery  wash BODY AGAIN with CHG Soap   4. Do NOT apply benzoyl peroxide gel on the day of surgery   Please read over the following fact sheets that you were given.

## 2023-03-10 ENCOUNTER — Other Ambulatory Visit: Payer: Self-pay

## 2023-03-10 ENCOUNTER — Encounter (HOSPITAL_COMMUNITY): Payer: Self-pay

## 2023-03-10 ENCOUNTER — Encounter (HOSPITAL_COMMUNITY)
Admission: RE | Admit: 2023-03-10 | Discharge: 2023-03-10 | Disposition: A | Discharge status: Home / Self Care | Payer: 59 | Source: Ambulatory Visit | Attending: Orthopedic Surgery | Admitting: Orthopedic Surgery

## 2023-03-10 VITALS — BP 144/83 | HR 107 | Temp 98.6°F | Resp 17 | Ht 66.0 in | Wt 231.5 lb

## 2023-03-10 DIAGNOSIS — E1122 Type 2 diabetes mellitus with diabetic chronic kidney disease: Secondary | ICD-10-CM | POA: Diagnosis not present

## 2023-03-10 DIAGNOSIS — Z01818 Encounter for other preprocedural examination: Secondary | ICD-10-CM | POA: Diagnosis present

## 2023-03-10 DIAGNOSIS — Z86718 Personal history of other venous thrombosis and embolism: Secondary | ICD-10-CM | POA: Diagnosis not present

## 2023-03-10 DIAGNOSIS — G4733 Obstructive sleep apnea (adult) (pediatric): Secondary | ICD-10-CM | POA: Diagnosis not present

## 2023-03-10 DIAGNOSIS — K219 Gastro-esophageal reflux disease without esophagitis: Secondary | ICD-10-CM | POA: Diagnosis not present

## 2023-03-10 DIAGNOSIS — M7552 Bursitis of left shoulder: Secondary | ICD-10-CM | POA: Diagnosis not present

## 2023-03-10 DIAGNOSIS — I5032 Chronic diastolic (congestive) heart failure: Secondary | ICD-10-CM | POA: Diagnosis not present

## 2023-03-10 DIAGNOSIS — I110 Hypertensive heart disease with heart failure: Secondary | ICD-10-CM | POA: Diagnosis not present

## 2023-03-10 DIAGNOSIS — M19012 Primary osteoarthritis, left shoulder: Secondary | ICD-10-CM | POA: Insufficient documentation

## 2023-03-10 LAB — BASIC METABOLIC PANEL
Anion gap: 8 (ref 5–15)
BUN: 11 mg/dL (ref 6–20)
CO2: 27 mmol/L (ref 22–32)
Calcium: 9.4 mg/dL (ref 8.9–10.3)
Chloride: 108 mmol/L (ref 98–111)
Creatinine, Ser: 1 mg/dL (ref 0.61–1.24)
GFR, Estimated: >60 mL/min (ref >60)
Glucose, Bld: 195 mg/dL — ABNORMAL HIGH (ref 70–99)
Potassium: 4.3 mmol/L (ref 3.5–5.1)
Sodium: 143 mmol/L (ref 135–145)

## 2023-03-10 LAB — CBC
HCT: 55.8 % — ABNORMAL HIGH (ref 39.0–52.0)
Hemoglobin: 17.6 g/dL — ABNORMAL HIGH (ref 13.0–17.0)
MCH: 25.4 pg — ABNORMAL LOW (ref 26.0–34.0)
MCHC: 31.5 g/dL (ref 30.0–36.0)
MCV: 80.5 fL (ref 80.0–100.0)
Platelets: 250 10*3/uL (ref 150–400)
RBC: 6.93 MIL/uL — ABNORMAL HIGH (ref 4.22–5.81)
RDW: 15.6 % — ABNORMAL HIGH (ref 11.5–15.5)
WBC: 4.9 10*3/uL (ref 4.0–10.5)
nRBC: 0 % (ref 0.0–0.2)

## 2023-03-10 LAB — GLUCOSE, CAPILLARY: Glucose-Capillary: 252 mg/dL — ABNORMAL HIGH (ref 70–99)

## 2023-03-10 NOTE — Progress Notes (Addendum)
PCP - Dr. Derinda Sis  Cardiologist - was seen by Dr. Bing Matter in 2022 for pre op clearance.  EP-no  Endocrine-no  Pulm-no  Chest x-ray - na  EKG - 03/10/23  Stress Test - 11/24/20  ECHO - 11/24/20  Cardiac Cath - no  AICD-no PM-no LOOP-no  Nerve Stimulator-no  Dialysis-no  Sleep Study - yes-2013 CPAP - no,  'could not afford, from the chart';Scott [name] reported that it was a mild case.  Was ordered a home sleep study in 2020 with [facility]- was not done- the order expired.  LABS-CBC, BMP  ASA- 81 mg- I instructed [name] to call Dr. Diamantina Providence office and ask for instructions.  ERAS-yes  HA1C-02/24/23- 7.9 GLP-1-Ozempic- last dose was 03/05/23 Fasting Blood Sugar - 120 Checks Blood Sugar has a continuous reader- CBG 2 hours  after eating 220s  Anesthesia-CBG was 252 at arrival to PAT appointment, patent stated he ate a chicken biscuit in the past 2 hours.  Scott [name]  was seen 01/11/23 at PCP office for congestion, post nasal drip, rhinorrhea and sinus pressure, cough and myalgias. [name] tested negative for Covid,  Dr. Okey Dupre diagnosed [name] with a viral infection, she prescribed promethazine/ DM.   Scott [name]  had a video visit 01/21/23, with Jiles Prows, NP  for continued cough; [name] reported  that 2 days prior to the appointment, he experienced chest pain,neck, and stomach and headache after exercising - lasting 5-8 minutes. Acute bacterial bronchitis was the diagnosed,  Augmentin was prescribed  for 5 days. Scott [name] had a video appointment on 02/18/23, with Jiles Prows, NP, [name] continued to have congestion, sore throat, cough and headache. (Scott [name] traveled to [place], 10/8- 02/12/23. [name] reported that he had not taken Augmenttin as prescribed, a prescription for Azithromycin, 5 day treatment given.  Scott [name] was seen by Dr. Edwina Barth, 03/02/23 [name] was feeling much better.  At PAT visit today, Scott [name] has no complaints  or s/s  of respiratory infection, and has not had any more chest pain.   Pt denies having chest pain, sob, or fever at this time. All instructions explained to the pt, with a verbal understanding of the material. Pt agrees to go over the instructions while at home for a better understanding. The opportunity to ask questions was provided.

## 2023-03-10 NOTE — Pre-Procedure Instructions (Signed)
Surgical Instructions   Your procedure is scheduled on March 17, 2023. Report to [facility] Main Entrance "A" at 9:00 A.M., then check in with the Admitting office. Any questions or running late day of surgery: call [phone]  Questions prior to your surgery date: call [phone], Monday-Friday, 8am-4pm. If you experience any cold or flu symptoms such as cough, fever, chills, shortness of breath, etc. between now and your scheduled surgery, please notify us at the above number.     Remember:  Do not eat after midnight the night before your surgery  You may drink clear liquids until 8:00 AM the morning of your surgery.   Clear liquids allowed are: Water, Non-Citrus Juices (without pulp), Carbonated Beverages, Clear Tea (no milk, honey, etc.), Black Coffee Only (NO MILK, CREAM OR POWDERED CREAMER of any kind), and Gatorade.  Patient Instructions  The night before surgery:  No food after midnight. ONLY clear liquids after midnight  The day of surgery (if you have diabetes): Drink ONE (1) 12 oz G2 given to you in your pre admission testing appointment by 8:00 AM the morning of surgery. Drink in one sitting. Do not sip.  This drink was given to you during your hospital  pre-op appointment visit.  Nothing else to drink after completing the  12 oz bottle of G2.         If you have questions, please contact your surgeon's office.     Take these medicines the morning of surgery with A SIP OF WATER: None, takes Amlodipine at night.   May take these medicines IF NEEDED: fluticasone (FLONASE) nasal spray  loratadine (CLARITIN)    One week prior to surgery, STOP taking any Aspirin (unless otherwise instructed by your surgeon) Aleve, Naproxen, Ibuprofen, Motrin, Advil, Goody's, BC's, all herbal medications, fish oil, and non-prescription vitamins.   WHAT DO I DO ABOUT MY DIABETES MEDICATION?   Do not take metFORMIN (GLUCOPHAGE) the morning of surgery.  STOP taking  JARDIANCE three days prior to surgery. Your last dose will be November 10th.     STOP taking Semaglutide one week prior to surgery. DO NOT take any doses after November 6th.   HOW TO MANAGE YOUR DIABETES BEFORE AND AFTER SURGERY  Why is it important to control my blood sugar before and after surgery? Improving blood sugar levels before and after surgery helps healing and can limit problems. A way of improving blood sugar control is eating a healthy diet by:  Eating less sugar and carbohydrates  Increasing activity/exercise  Talking with your doctor about reaching your blood sugar goals High blood sugars (greater than 180 mg/dL) can raise your risk of infections and slow your recovery, so you will need to focus on controlling your diabetes during the weeks before surgery. Make sure that the doctor who takes care of your diabetes knows about your planned surgery including the date and location.  How do I manage my blood sugar before surgery? Check your blood sugar at least 4 times a day, starting 2 days before surgery, to make sure that the level is not too high or low.  Check your blood sugar the morning of your surgery when you wake up and every 2 hours until you get to the Short Stay unit.  If your blood sugar is less than 70 mg/dL, you will need to treat for low blood sugar: Do not take insulin. Treat a low blood sugar (less than 70 mg/dL) with  cup of clear juice (cranberry or apple),  4 glucose tablets, OR glucose gel. Recheck blood sugar in 15 minutes after treatment (to make sure it is greater than 70 mg/dL). If your blood sugar is not greater than 70 mg/dL on recheck, call [phone] for further instructions. Report your blood sugar to the short stay nurse when you get to Short Stay.  If you are admitted to the hospital after surgery: Your blood sugar will be checked by the staff and you will probably be given insulin after surgery (instead of oral diabetes medicines) to make  sure you have good blood sugar levels. The goal for blood sugar control after surgery is 80-180 mg/dL.                      Do NOT Smoke (Tobacco/Vaping) for 24 hours prior to your procedure.  If you use a CPAP at night, you may bring your mask/headgear for your overnight stay.   You will be asked to remove any contacts, glasses, piercing's, hearing aid's, dentures/partials prior to surgery. Please bring cases for these items if needed.    Patients discharged the day of surgery will not be allowed to drive home, and someone needs to stay with them for 24 hours.  SURGICAL WAITING ROOM VISITATION Patients may have no more than 2 support people in the waiting area - these visitors may rotate.   Pre-op nurse will coordinate an appropriate time for 1 ADULT support person, who may not rotate, to accompany patient in pre-op.  Children under the age of 73 must have an adult with them who is not the patient and must remain in the main waiting area with an adult.  If the patient needs to stay at the hospital during part of their recovery, the visitor guidelines for inpatient rooms apply.  Please refer to the [facility] website for the visitor guidelines for any additional information.   If you received a COVID test during your pre-op visit  it is requested that you wear a mask when out in public, stay away from anyone that may not be feeling well and notify your surgeon if you develop symptoms. If you have been in contact with anyone that has tested positive in the last 10 days please notify you surgeon.      Pre-operative 5 CHG Bathing Instructions   You can play a key role in reducing the risk of infection after surgery. Your skin needs to be as free of germs as possible. You can reduce the number of germs on your skin by washing with CHG (chlorhexidine gluconate) soap before surgery. CHG is an antiseptic soap that kills germs and continues to kill germs even after washing.   DO NOT use if  you have an allergy to chlorhexidine/CHG or antibacterial soaps. If your skin becomes reddened or irritated, stop using the CHG and notify one of our RNs at [phone].   Please shower with the CHG soap starting 4 days before surgery using the following schedule:     Please keep in mind the following:  DO NOT shave, including legs and underarms, starting the day of your first shower.   You may shave your face at any point before/day of surgery.  Place clean sheets on your bed the day you start using CHG soap. Use a clean washcloth (not used since being washed) for each shower. DO NOT sleep with pets once you start using the CHG.   CHG Shower Instructions:  Wash your face and private area with normal  soap. If you choose to wash your hair, wash first with your normal shampoo.  After you use shampoo/soap, rinse your hair and body thoroughly to remove shampoo/soap residue.  Turn the water OFF and apply about 3 tablespoons (45 ml) of CHG soap to a CLEAN washcloth.  Apply CHG soap ONLY FROM YOUR NECK DOWN TO YOUR TOES (washing for 3-5 minutes)  DO NOT use CHG soap on face, private areas, open wounds, or sores.  Pay special attention to the area where your surgery is being performed.  If you are having back surgery, having someone wash your back for you may be helpful. Wait 2 minutes after CHG soap is applied, then you may rinse off the CHG soap.  Pat dry with a clean towel  Put on clean clothes/pajamas   If you choose to wear lotion, please use ONLY the CHG-compatible lotions on the back of this paper.   Additional instructions for the day of surgery: DO NOT APPLY any lotions, deodorants, cologne, or perfumes.   Do not bring valuables to the hospital. [facility] is not responsible for any belongings/valuables. Do not wear nail polish, gel polish, artificial nails, or any other type of covering on natural nails (fingers and toes) Do not wear jewelry or makeup Put on clean/comfortable  clothes.  Please brush your teeth.  Ask your nurse before applying any prescription medications to the skin.     CHG Compatible Lotions   Aveeno Moisturizing lotion  Cetaphil Moisturizing Cream  Cetaphil Moisturizing Lotion  Clairol Herbal Essence Moisturizing Lotion, Dry Skin  Clairol Herbal Essence Moisturizing Lotion, Extra Dry Skin  Clairol Herbal Essence Moisturizing Lotion, Normal Skin  Curel Age Defying Therapeutic Moisturizing Lotion with Alpha Hydroxy  Curel Extreme Care Body Lotion  Curel Soothing Hands Moisturizing Hand Lotion  Curel Therapeutic Moisturizing Cream, Fragrance-Free  Curel Therapeutic Moisturizing Lotion, Fragrance-Free  Curel Therapeutic Moisturizing Lotion, Original Formula  Eucerin Daily Replenishing Lotion  Eucerin Dry Skin Therapy Plus Alpha Hydroxy Crme  Eucerin Dry Skin Therapy Plus Alpha Hydroxy Lotion  Eucerin Original Crme  Eucerin Original Lotion  Eucerin Plus Crme Eucerin Plus Lotion  Eucerin TriLipid Replenishing Lotion  Keri Anti-Bacterial Hand Lotion  Keri Deep Conditioning Original Lotion Dry Skin Formula Softly Scented  Keri Deep Conditioning Original Lotion, Fragrance Free Sensitive Skin Formula  Keri Lotion Fast Absorbing Fragrance Free Sensitive Skin Formula  Keri Lotion Fast Absorbing Softly Scented Dry Skin Formula  Keri Original Lotion  Keri Skin Renewal Lotion Keri Silky Smooth Lotion  Keri Silky Smooth Sensitive Skin Lotion  Nivea Body Creamy Conditioning Oil  Nivea Body Extra Enriched Lotion  Nivea Body Original Lotion  Nivea Body Sheer Moisturizing Lotion Nivea Crme  Nivea Skin Firming Lotion  NutraDerm 30 Skin Lotion  NutraDerm Skin Lotion  NutraDerm Therapeutic Skin Cream  NutraDerm Therapeutic Skin Lotion  ProShield Protective Hand Cream  Provon moisturizing lotion   Please read over the fact sheets that you were given.

## 2023-03-11 ENCOUNTER — Telehealth: Payer: Self-pay | Admitting: Physical Medicine and Rehabilitation

## 2023-03-11 NOTE — Anesthesia Preprocedure Evaluation (Signed)
Anesthesia Evaluation  Patient identified by MRN, date of birth, ID band Patient awake    Reviewed: Allergy & Precautions, NPO status , Patient's Chart, lab work & pertinent test results, reviewed documented beta blocker date and time   History of Anesthesia Complications Negative for: history of anesthetic complications  Airway Mallampati: II  TM Distance: >3 FB Neck ROM: Full    Dental no notable dental hx.    Pulmonary sleep apnea , PE (remote hx)   breath sounds clear to auscultation       Cardiovascular Exercise Tolerance: Good hypertension, (-) CAD, (-) Past MI, (-) Cardiac Stents, (-) CABG and (-) CHF  Rhythm:Regular Rate:Normal     Neuro/Psych  Headaches, neg Seizures  Neuromuscular disease    GI/Hepatic ,GERD  ,,(+) neg Cirrhosis        Endo/Other  diabetes, Type 2    Renal/GU Renal disease     Musculoskeletal  (+) Arthritis ,    Abdominal   Peds  Hematology   Anesthesia Other Findings   Reproductive/Obstetrics                             Anesthesia Physical Anesthesia Plan  ASA: 2  Anesthesia Plan: General   Post-op Pain Management: Regional block*   Induction: Intravenous  PONV Risk Score and Plan: 2 and Ondansetron and Dexamethasone  Airway Management Planned: Oral ETT  Additional Equipment:   Intra-op Plan:   Post-operative Plan: Extubation in OR  Informed Consent: I have reviewed the patients History and Physical, chart, labs and discussed the procedure including the risks, benefits and alternatives for the proposed anesthesia with the patient or authorized representative who has indicated his/her understanding and acceptance.     Dental advisory given  Plan Discussed with: CRNA  Anesthesia Plan Comments: (PAT note written 03/11/2023 by Shonna Chock, PA-C.  )       Anesthesia Quick Evaluation

## 2023-03-11 NOTE — Telephone Encounter (Signed)
Pt returning call

## 2023-03-11 NOTE — Telephone Encounter (Signed)
Attempted to call patient this morning. No answer, left VM for him to call back.

## 2023-03-11 NOTE — Progress Notes (Signed)
Anesthesia Chart Review:  Case: [id number] Date/Time: 03/17/23 1045   Procedure: LEFT SHOULDER ARTHROSCOPY, SUBACROMIAL BURSECTOMY, ARTHROSCOPIC DISTAL CLAVICLE EXCISION (Left)   Anesthesia type: General   Pre-op diagnosis: left shoulder bursitis, acromioclavicular arthritis   [facility]: [facility] / [facility]   Surgeons: Cammy Copa, MD       DISCUSSION: Patient is a 52 year old male scheduled for the above procedure.  History includes never smoker, HTN, DM2, OSA (does not use CPAP), diastolic CHF, PE (2013),DVT (LLE), GERD. No evidence of CAD with CAC of 0 on 02/16/18.  Patient had URI symptoms dating back to 01/11/23 and was ultimately prescribed Augmentin 01/21/23 but for unclear reasons only took partial course and was later prescribed azithromycin on 02/18/23. He felt much better by 03/02/23 after completing the antibiotic course. He denied any respiratory symptoms at 03/10/23 PAT RN visit.   He is also followed by Dr. Willia Craze for chronic back pain with "facet arthropathy bilaterally at L4/5 and a left paracentral disc herniation at L4/5."  He has gotten some relief with injection and facet ablations. If he ultimately needs microdiscectomy, Dr. Christell Constant would like A1c < 7.5 for back surgery. Last A1c 7.9% on 02/24/23. Ozempic dose increased and Jardiance 25 mg daily and metformin 500 mg BID continued.  Last Ozempic 03/05/23. At PAT, instructed to hold Ozempic for 1 week and Jardiance 3 days prior to surgery.  Anesthesia team to evaluate on the day of surgery.   VS: BP (!) 144/83   Pulse (!) 107   Temp 37 C   Resp 17   Ht 5\' 6"  (1.676 m)   Wt 105 kg   SpO2 97%   BMI 37.37 kg/m  HR 93 bpm on EKG  PROVIDERS: Georgina Quint, MD is PCP  - He saw cardiologist Gypsy Balsam, MD on 11/21/20 for preoperative evaluation.    LABS: Preoperative labs noted. H/H 17.6/455.8, previously 16.8/52.7 on 02/24/23. A1c 7.9% on 02/24/23.  (all labs ordered are listed, but only  abnormal results are displayed)  Labs Reviewed  GLUCOSE, CAPILLARY - Abnormal; Notable for the following components:      Result Value   Glucose-Capillary 252 (*)    All other components within normal limits  CBC - Abnormal; Notable for the following components:   RBC 6.93 (*)    Hemoglobin 17.6 (*)    HCT 55.8 (*)    MCH 25.4 (*)    RDW 15.6 (*)    All other components within normal limits  BASIC METABOLIC PANEL - Abnormal; Notable for the following components:   Glucose, Bld 195 (*)    All other components within normal limits    IMAGES: MRI Left Shoulder 08/17/22: IMPRESSION: 1. Mild anterior supraspinatus and minimal superior subscapularis tendinosis. No rotator cuff tear. 2. Moderate degenerative changes of the acromioclavicular joint. 3. Mild subacromial/subdeltoid bursitis. 4. Mild to moderate glenohumeral cartilage thinning.  Right Ribs & CXR 3V 03/09/22: FINDINGS: No fracture or other bone lesions are seen involving the ribs. There is no evidence of pneumothorax or pleural effusion. Both lungs are clear. Heart size and mediastinal contours are within normal limits. IMPRESSION: Negative.    EKG: 03/10/23: NSR at 93 bpm   CV: Echo 11/24/20:  1. Left ventricular ejection fraction, by estimation, is 55 to 60%. The left ventricle has normal function. The left ventricle has no regional wall motion abnormalities. There is moderate asymmetric left ventricular hypertrophy of the septal and basal-septal segments. Left ventricular diastolic parameters were  normal.   2. Right ventricular systolic function is normal. The right ventricular size is normal. Tricuspid regurgitation signal is inadequate for assessing PA pressure.   3. The mitral valve is normal in structure. Trivial mitral valve regurgitation. No evidence of mitral stenosis.   4. The aortic valve is tricuspid. Aortic valve regurgitation is not visualized. No aortic stenosis is present.     CT coronary morphology  02/16/18:  1. Coronary calcium score of 0. This was 0 percentile for age and sex matched control. 2. Normal coronary origin with left dominance. 3. No evidence of CAD.     Nuclear stress test 11/15/16:  Nuclear stress EF: 58%. Blood pressure demonstrated a normal response to exercise. There was no ST segment deviation noted during stress. The study is normal. This is a low risk study. The left ventricular ejection fraction is normal (55-65%).     Past Medical History:  Diagnosis Date   Arthritis    Carpal tunnel syndrome    Chronic migraine 01/23/2019   Diabetes mellitus    Diastolic heart failure (HCC)    DVT (deep venous thrombosis) (HCC)    left calf   GERD (gastroesophageal reflux disease)    Hypertension    Obesity    Pulmonary embolism (HCC) 2013   He has a history of HTN.  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 A repeat scan done in 2017 did not demonstrate evidence of emboli. Neither study demonstrated any coronary calcifications.    Pulmonary embolism (HCC)    Shoulder pain, bilateral    Sleep apnea    was fitted for mask and never got machine - too costly    Past Surgical History:  Procedure Laterality Date   CARPAL TUNNEL RELEASE Right 12/04/2015   Procedure: RIGHT CARPAL TUNNEL RELEASE;  Surgeon: Betha Loa, MD;  Location: [facility];  Service: Orthopedics;  Laterality: Right;  RIGHT CARPAL TUNNEL RELEASE   CARPAL TUNNEL RELEASE Right 12/2015   [facility]   CARPAL TUNNEL RELEASE Left 04/01/2016   Procedure: LEFT CARPAL TUNNEL RELEASE;  Surgeon: Betha Loa, MD;  Location: [facility];  Service: Orthopedics;  Laterality: Left;   I & D EXTREMITY Left 02/19/2021   Procedure: LEFT FOOT SPUR REMOVAL;  Surgeon: Cammy Copa, MD;  Location: [facility] OR;  Service: Orthopedics;  Laterality: Left;   KNEE ARTHROSCOPY WITH MEDIAL MENISECTOMY Left 02/19/2021   Procedure: LEFT KNEE ARTHROSCOPY, MENISCAL CYST  DECOMPRESSION;  Surgeon: Cammy Copa, MD;  Location: [facility] OR;  Service: Orthopedics;  Laterality: Left;   right hand surgery     ROTATOR CUFF REPAIR Right     MEDICATIONS:  Aloe Vera OIL   aspirin 81 MG chewable tablet   amLODipine (NORVASC) 10 MG tablet   atorvastatin (LIPITOR) 20 MG tablet   fluticasone (FLONASE) 50 MCG/ACT nasal spray   glucose blood test strip   JARDIANCE 25 MG TABS tablet   lisinopril (ZESTRIL) 20 MG tablet   loratadine (CLARITIN) 10 MG tablet   metFORMIN (GLUCOPHAGE) 500 MG tablet   Semaglutide, 2 MG/DOSE, 8 MG/3ML SOPN   trolamine salicylate (BLUE-EMU HEMP) 10 % cream   No current facility-administered medications for this encounter.    Shonna Chock, PA-C Surgical Short Stay/Anesthesiology [facility] Phone [phone] [facility] Phone [phone] 03/11/2023 7:29 PM

## 2023-03-14 ENCOUNTER — Telehealth: Payer: Self-pay | Admitting: Physical Medicine and Rehabilitation

## 2023-03-14 NOTE — Telephone Encounter (Signed)
Pt came in was supposed to be speaking with Lorna Dibble about his back pain he missed a call from her and is unsure what the question were answered

## 2023-03-15 ENCOUNTER — Telehealth: Payer: Self-pay

## 2023-03-15 ENCOUNTER — Telehealth: Payer: Self-pay | Admitting: Physical Medicine and Rehabilitation

## 2023-03-15 MED ORDER — CHLORHEXIDINE GLUCONATE 0.12 % MT SOLN
OROMUCOSAL | Status: AC
  Filled 2023-03-15: qty 15

## 2023-03-15 MED ORDER — CEFAZOLIN SODIUM-DEXTROSE 2-4 GM/100ML-% IV SOLN
INTRAVENOUS | Status: AC
  Filled 2023-03-15: qty 100

## 2023-03-15 NOTE — Telephone Encounter (Signed)
[employer] approved cpt code 21308 but denied [id number] per [facility] portal. No rationale letter generated at this time. I called [employer] and CSR says [phone] denied due to exam findings and imaging not showing impingement. P2P is not an option and appeal would be the next step. Patient is scheduled for 11/14. Please advise on how you would like me to proceed.

## 2023-03-16 ENCOUNTER — Encounter: Payer: Self-pay | Admitting: Family Medicine

## 2023-03-16 ENCOUNTER — Ambulatory Visit (INDEPENDENT_AMBULATORY_CARE_PROVIDER_SITE_OTHER): Payer: 59 | Admitting: Family Medicine

## 2023-03-16 ENCOUNTER — Telehealth: Payer: Self-pay

## 2023-03-16 VITALS — BP 132/84 | Ht 66.0 in | Wt 230.0 lb

## 2023-03-16 DIAGNOSIS — M25561 Pain in right knee: Secondary | ICD-10-CM | POA: Diagnosis not present

## 2023-03-16 DIAGNOSIS — M1711 Unilateral primary osteoarthritis, right knee: Secondary | ICD-10-CM

## 2023-03-16 MED ORDER — METHYLPREDNISOLONE ACETATE 40 MG/ML IJ SUSP
40.0000 mg | Freq: Once | INTRAMUSCULAR | Status: AC
  Administered 2023-03-16: 40 mg via INTRA_ARTICULAR

## 2023-03-16 NOTE — Patient Instructions (Signed)
 Today you received an injection with a corticosteroid. This injection is usually done in response to pain and inflammation. There is some "numbing medicine" (Lidocaine) in the shot, so the injected area may be numb and feel really good for the next couple of hours. The numbing medicine usually wears off in 2-3 hours, and then your pain level may be back to where it was before the injection until the steroid starts working.    You may actually experience a small (as in 10%) INCREASE in pressure sensation in the first 24 hours---that is common.  Things to watch out for that you should contact us or a health care provider urgently would include: 1. Unusual (as in more than 10%) increase in pain 2. New fever > 101.5 3. New swelling or redness of the injected area. 4. Streaking of red lines around the area injected.  Do not hesitate to call or reach out with any questions or concerns.

## 2023-03-16 NOTE — Progress Notes (Unsigned)
DEVVIN [name] - 52 y.o. male MRN [id number]  Date of birth: 02-08-1971  PCP: Georgina Quint, [name]  Subjective:  No chief complaint on file.  R knee pain with hx of OA  HPI: Past Medical, Surgical, Social, and Family History Reviewed & Updated per EMR.   Patient is a 52 y.o. male here for follow up on R knee pain due to osteoarthritis. He received steroid injections years ago and has had great relief since then. His left knee has been the main concern but that is feeling well now. He has shoulder surgery scheduled for tomorrow but he called the office and confirmed with the surgeon that a steroid injection in the knee would not interfere with this.   Past Medical History:  Diagnosis Date   Arthritis    Carpal tunnel syndrome    Chronic migraine 01/23/2019   Diabetes mellitus    Diastolic heart failure (HCC)    DVT (deep venous thrombosis) (HCC)    left calf   GERD (gastroesophageal reflux disease)    Hypertension    Obesity    Pulmonary embolism (HCC) 2013   He has a history of HTN.  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 A repeat scan done in 2017 did not demonstrate evidence of emboli. Neither study demonstrated any coronary calcifications.    Pulmonary embolism (HCC)    Shoulder pain, bilateral    Sleep apnea    was fitted for mask and never got machine - too costly    Current Outpatient Medications on File Prior to Visit  Medication Sig Dispense Refill   Aloe Vera OIL by Does not apply route. Blue stop- Emu oil, Aloe oil, Coconut oil     amLODipine (NORVASC) 10 MG tablet Take 1 tablet (10 mg total) by mouth daily. 90 tablet 3   aspirin 81 MG chewable tablet Chew by mouth daily.     atorvastatin (LIPITOR) 20 MG tablet Take 1 tablet (20 mg total) by mouth daily. 90 tablet 3   fluticasone (FLONASE) 50 MCG/ACT nasal spray Place 2 sprays into both nostrils daily. (Patient taking differently: Place 2 sprays into both nostrils daily as needed  for allergies.) 16 mL 0   glucose blood test strip Use as instructed. Inject into the skin once daily. E11.65 200 each 12   JARDIANCE 25 MG TABS tablet Take 1 tablet (25 mg total) by mouth daily. 90 tablet 3   lisinopril (ZESTRIL) 20 MG tablet Take 1 tablet (20 mg total) by mouth daily. 90 tablet 3   loratadine (CLARITIN) 10 MG tablet Take 10 mg by mouth daily as needed for allergies.     metFORMIN (GLUCOPHAGE) 500 MG tablet TAKE 1 TABLET BY MOUTH TWICE A DAY WITH A MEAL 180 tablet 3   Semaglutide, 2 MG/DOSE, 8 MG/3ML SOPN Inject 2 mg into the skin once a week. 3 mL 5   trolamine salicylate (BLUE-EMU HEMP) 10 % cream Apply 1 application topically as needed for muscle pain.     No current facility-administered medications on file prior to visit.    Past Surgical History:  Procedure Laterality Date   CARPAL TUNNEL RELEASE Right 12/04/2015   Procedure: RIGHT CARPAL TUNNEL RELEASE;  Surgeon: Betha Loa, [name];  Location: [facility];  Service: Orthopedics;  Laterality: Right;  RIGHT CARPAL TUNNEL RELEASE   CARPAL TUNNEL RELEASE Right 12/2015   [facility]   CARPAL TUNNEL RELEASE Left 04/01/2016   Procedure: LEFT CARPAL TUNNEL  RELEASE;  Surgeon: Betha Loa, [name];  Location: [facility];  Service: Orthopedics;  Laterality: Left;   I & D EXTREMITY Left 02/19/2021   Procedure: LEFT FOOT SPUR REMOVAL;  Surgeon: Cammy Copa, [name];  Location: [facility] OR;  Service: Orthopedics;  Laterality: Left;   KNEE ARTHROSCOPY WITH MEDIAL MENISECTOMY Left 02/19/2021   Procedure: LEFT KNEE ARTHROSCOPY, MENISCAL CYST DECOMPRESSION;  Surgeon: Cammy Copa, [name];  Location: [facility] OR;  Service: Orthopedics;  Laterality: Left;   right hand surgery     ROTATOR CUFF REPAIR Right     Allergies  Allergen Reactions   Pollen Extract Other (See Comments)        Objective:  Physical Exam: VS: BP:132/84  HR: bpm  TEMP: ( )  RESP:   HT:5\' 6"  (167.6 cm)   WT:230 lb (104.3 kg)  BMI:37.14  Gen:  NAD, speaks clearly, comfortable in exam room Respiratory: Normal respiratory effort on room air. No signs of distress Skin: No rashes, abrasions, or ecchymosis MSK:  R Knee: Inspection: no deformity, erythema or warmth.   ROM: full Strength: 5/5 in all directions Palpation w/ no warmth, trace effusion present, no posterior tenderness Medial>lateral joint line tenderness to palpation  no tenderness over Gerdy's tubercle, pes bursa, or tibial tuberosity no patellar tenderness, translation NT no condyle tenderness. Special Tests: varus/valgus stress w/ good endpoints Collateral ligaments: anterior/posterior drawer negative Neuro: no sensory deficits, normal gait     Assessment & Plan:   Osteoarthritis of right knee - The patient benefited from steroid injection in the past. No current infections or illness.  - I discussed the possibility of an injection interfering with the shoulder surgery although remote. The patient voiced understanding and desire to proceed given his confirmation with the surgery office that it will be ok.  - Quad strengthening exercises given - We will follow up as needed.   Procedure:  Right Knee Steroid Injection: Risks and benefits of procedure were discussed with patient. After informed written consent was obtained, timeout was performed, patient was seated on exam table. The right knee was prepped with alcohol swab and utilizing anterior lateral approach, patient's right knee was injected intraarticularly with 3:1 lidocaine:depomedrol. Patient tolerated the procedure well without immediate complications.      Scott [name] Fellow  Addendum:  Patient seen and examined in the office by fellow.   History, exam, plan of care were precepted with me.  Agree with findings as documented in fellow note  as noted above.  Scott Lamer, DO, CAQSM

## 2023-03-16 NOTE — Telephone Encounter (Signed)
Yeah think this should be okay since his surgery is on his left shoulder and not his knee.

## 2023-03-16 NOTE — Assessment & Plan Note (Addendum)
-   The patient benefited from steroid injection in the past. No current infections or illness.  - I discussed the possibility of an injection interfering with the shoulder surgery although remote. The patient voiced understanding and desire to proceed given his confirmation with the surgery office that it will be ok.  - Quad strengthening exercises given - We will follow up as needed.   Procedure:  Right Knee Steroid Injection: Risks and benefits of procedure were discussed with patient. After informed written consent was obtained, timeout was performed, patient was seated on exam table. The right knee was prepped with alcohol swab and utilizing anterior lateral approach, patient's right knee was injected intraarticularly with 3:1 lidocaine:depomedrol. Patient tolerated the procedure well without immediate complications.

## 2023-03-16 NOTE — Telephone Encounter (Signed)
Lvm advising  

## 2023-03-16 NOTE — Telephone Encounter (Signed)
Patient would like to know if it is okay for him to get a cortisone injection in his knee today and still be okay to have his surgery on tomorrow.  Cb# [phone].  Please advise.  Thank you.

## 2023-03-17 ENCOUNTER — Encounter (HOSPITAL_COMMUNITY)
Admission: RE | Disposition: A | Discharge status: Home / Self Care | Payer: Self-pay | Source: Home / Self Care | Attending: Orthopedic Surgery

## 2023-03-17 ENCOUNTER — Ambulatory Visit (HOSPITAL_COMMUNITY)
Admission: RE | Admit: 2023-03-17 | Discharge: 2023-03-17 | Disposition: A | Discharge status: Home / Self Care | Payer: 59 | Attending: Orthopedic Surgery | Admitting: Orthopedic Surgery

## 2023-03-17 ENCOUNTER — Ambulatory Visit (HOSPITAL_COMMUNITY): Payer: 59 | Admitting: Anesthesiology

## 2023-03-17 ENCOUNTER — Encounter (HOSPITAL_COMMUNITY): Payer: Self-pay | Admitting: Orthopedic Surgery

## 2023-03-17 ENCOUNTER — Ambulatory Visit (HOSPITAL_COMMUNITY): Payer: 59 | Admitting: Vascular Surgery

## 2023-03-17 ENCOUNTER — Other Ambulatory Visit: Payer: Self-pay

## 2023-03-17 DIAGNOSIS — Z86711 Personal history of pulmonary embolism: Secondary | ICD-10-CM | POA: Diagnosis not present

## 2023-03-17 DIAGNOSIS — M19012 Primary osteoarthritis, left shoulder: Secondary | ICD-10-CM

## 2023-03-17 DIAGNOSIS — I110 Hypertensive heart disease with heart failure: Secondary | ICD-10-CM | POA: Insufficient documentation

## 2023-03-17 DIAGNOSIS — E119 Type 2 diabetes mellitus without complications: Secondary | ICD-10-CM | POA: Insufficient documentation

## 2023-03-17 DIAGNOSIS — G4730 Sleep apnea, unspecified: Secondary | ICD-10-CM | POA: Insufficient documentation

## 2023-03-17 DIAGNOSIS — I5032 Chronic diastolic (congestive) heart failure: Secondary | ICD-10-CM | POA: Diagnosis not present

## 2023-03-17 DIAGNOSIS — Z01818 Encounter for other preprocedural examination: Secondary | ICD-10-CM

## 2023-03-17 LAB — GLUCOSE, CAPILLARY
Glucose-Capillary: 193 mg/dL — ABNORMAL HIGH (ref 70–99)
Glucose-Capillary: 124 mg/dL — ABNORMAL HIGH (ref 70–99)
Glucose-Capillary: 189 mg/dL — ABNORMAL HIGH (ref 70–99)

## 2023-03-17 SURGERY — SHOULDER ARTHROSCOPY WITH SUBACROMIAL DECOMPRESSION AND DISTAL CLAVICLE EXCISION
Anesthesia: General | Site: Shoulder | Laterality: Left

## 2023-03-17 MED ORDER — SODIUM CHLORIDE 0.9 % IR SOLN
Status: DC | PRN
  Administered 2023-03-17: 1000 mL
  Administered 2023-03-17: 6000 mL

## 2023-03-17 MED ORDER — ORAL CARE MOUTH RINSE: 15.0000 mL | Freq: Once | OROMUCOSAL | Status: AC

## 2023-03-17 MED ORDER — PHENYLEPHRINE 80 MCG/ML (10ML) SYRINGE FOR IV PUSH (FOR BLOOD PRESSURE SUPPORT): PREFILLED_SYRINGE | INTRAVENOUS | Status: DC | PRN

## 2023-03-17 MED ORDER — ONDANSETRON HCL 4 MG/2ML IJ SOLN
4.0000 mg | Freq: Once | INTRAMUSCULAR | Status: DC | PRN
Start: 2023-03-17 — End: 2023-03-17

## 2023-03-17 MED ORDER — FENTANYL CITRATE (PF) 100 MCG/2ML IJ SOLN: 100.0000 ug | Freq: Once | INTRAMUSCULAR | Status: AC

## 2023-03-17 MED ORDER — MIDAZOLAM HCL 2 MG/2ML IJ SOLN
INTRAMUSCULAR | Status: AC
  Filled 2023-03-17: qty 2

## 2023-03-17 MED ORDER — EPINEPHRINE PF 1 MG/ML IJ SOLN
INTRAMUSCULAR | Status: AC
  Filled 2023-03-17: qty 1

## 2023-03-17 MED ORDER — ROCURONIUM BROMIDE 10 MG/ML (PF) SYRINGE
PREFILLED_SYRINGE | INTRAVENOUS | Status: DC | PRN
  Administered 2023-03-17: 50 mg via INTRAVENOUS

## 2023-03-17 MED ORDER — FENTANYL CITRATE (PF) 100 MCG/2ML IJ SOLN
INTRAMUSCULAR | Status: AC
  Administered 2023-03-17: 100 ug via INTRAVENOUS
  Filled 2023-03-17: qty 2

## 2023-03-17 MED ORDER — CEFAZOLIN SODIUM-DEXTROSE 2-4 GM/100ML-% IV SOLN
2.0000 g | INTRAVENOUS | Status: AC
  Administered 2023-03-17: 2 g via INTRAVENOUS
  Filled 2023-03-17: qty 100

## 2023-03-17 MED ORDER — SUGAMMADEX SODIUM 200 MG/2ML IV SOLN
INTRAVENOUS | Status: DC | PRN
  Administered 2023-03-17: 200 mg via INTRAVENOUS

## 2023-03-17 MED ORDER — MORPHINE SULFATE (PF) 4 MG/ML IV SOLN
INTRAVENOUS | Status: AC
  Filled 2023-03-17: qty 1

## 2023-03-17 MED ORDER — EPINEPHRINE PF 1 MG/ML IJ SOLN
INTRAMUSCULAR | Status: AC
  Filled 2023-03-17: qty 2

## 2023-03-17 MED ORDER — EPINEPHRINE PF 1 MG/ML IJ SOLN
INTRAMUSCULAR | Status: DC | PRN
  Administered 2023-03-17 (×2): 1 mg

## 2023-03-17 MED ORDER — METHOCARBAMOL 500 MG PO TABS: 500.0000 mg | ORAL_TABLET | Freq: Three times a day (TID) | ORAL | 1 refills | Status: AC | PRN

## 2023-03-17 MED ORDER — FENTANYL CITRATE (PF) 100 MCG/2ML IJ SOLN: 25.0000 ug | INTRAMUSCULAR | Status: DC | PRN

## 2023-03-17 MED ORDER — BUPIVACAINE LIPOSOME 1.3 % IJ SUSP
INTRAMUSCULAR | Status: DC | PRN
  Administered 2023-03-17: 10 mL via PERINEURAL

## 2023-03-17 MED ORDER — OXYCODONE HCL 5 MG PO TABS: 5.0000 mg | ORAL_TABLET | Freq: Once | ORAL | Status: DC | PRN

## 2023-03-17 MED ORDER — PHENYLEPHRINE 80 MCG/ML (10ML) SYRINGE FOR IV PUSH (FOR BLOOD PRESSURE SUPPORT)
PREFILLED_SYRINGE | INTRAVENOUS | Status: DC | PRN
  Administered 2023-03-17 (×2): 160 ug via INTRAVENOUS

## 2023-03-17 MED ORDER — LIDOCAINE 2% (20 MG/ML) 5 ML SYRINGE
INTRAMUSCULAR | Status: DC | PRN
  Administered 2023-03-17: 100 mg via INTRAVENOUS

## 2023-03-17 MED ORDER — INSULIN ASPART 100 UNIT/ML IJ SOLN
0.0000 [IU] | INTRAMUSCULAR | Status: DC | PRN
  Administered 2023-03-17: 4 [IU] via SUBCUTANEOUS
  Filled 2023-03-17: qty 1

## 2023-03-17 MED ORDER — ACETAMINOPHEN 10 MG/ML IV SOLN: 1000.0000 mg | Freq: Once | INTRAVENOUS | Status: DC | PRN

## 2023-03-17 MED ORDER — ALBUTEROL SULFATE HFA 108 (90 BASE) MCG/ACT IN AERS
INHALATION_SPRAY | RESPIRATORY_TRACT | Status: DC | PRN
  Administered 2023-03-17: 8 via RESPIRATORY_TRACT

## 2023-03-17 MED ORDER — EPHEDRINE SULFATE-NACL 50-0.9 MG/10ML-% IV SOSY
PREFILLED_SYRINGE | INTRAVENOUS | Status: DC | PRN
  Administered 2023-03-17: 20 mg via INTRAVENOUS

## 2023-03-17 MED ORDER — BUPIVACAINE HCL (PF) 0.5 % IJ SOLN
INTRAMUSCULAR | Status: DC | PRN
  Administered 2023-03-17: 10 mL via PERINEURAL

## 2023-03-17 MED ORDER — CHLORHEXIDINE GLUCONATE 0.12 % MT SOLN
OROMUCOSAL | Status: AC
  Administered 2023-03-17: 15 mL via OROMUCOSAL
  Filled 2023-03-17: qty 15

## 2023-03-17 MED ORDER — LACTATED RINGERS IV SOLN
INTRAVENOUS | Status: DC
Start: 2023-03-17 — End: 2023-03-17

## 2023-03-17 MED ORDER — BUPIVACAINE HCL (PF) 0.25 % IJ SOLN
INTRAMUSCULAR | Status: AC
  Filled 2023-03-17: qty 30

## 2023-03-17 MED ORDER — MIDAZOLAM HCL 2 MG/2ML IJ SOLN
INTRAMUSCULAR | Status: DC | PRN
  Administered 2023-03-17: 1 mg via INTRAVENOUS

## 2023-03-17 MED ORDER — MIDAZOLAM HCL 2 MG/2ML IJ SOLN
INTRAMUSCULAR | Status: AC
  Administered 2023-03-17: 2 mg via INTRAVENOUS
  Filled 2023-03-17: qty 2

## 2023-03-17 MED ORDER — CLONIDINE HCL (ANALGESIA) 100 MCG/ML EP SOLN
EPIDURAL | Status: AC
  Filled 2023-03-17: qty 10

## 2023-03-17 MED ORDER — OXYCODONE HCL 5 MG/5ML PO SOLN: 5.0000 mg | Freq: Once | ORAL | Status: DC | PRN

## 2023-03-17 MED ORDER — ONDANSETRON HCL 4 MG/2ML IJ SOLN
INTRAMUSCULAR | Status: DC | PRN
  Administered 2023-03-17: 4 mg via INTRAVENOUS

## 2023-03-17 MED ORDER — DEXAMETHASONE SODIUM PHOSPHATE 10 MG/ML IJ SOLN
INTRAMUSCULAR | Status: DC | PRN
  Administered 2023-03-17: 4 mg via INTRAVENOUS

## 2023-03-17 MED ORDER — MIDAZOLAM HCL 2 MG/2ML IJ SOLN: 2.0000 mg | Freq: Once | INTRAMUSCULAR | Status: AC

## 2023-03-17 MED ORDER — FENTANYL CITRATE (PF) 250 MCG/5ML IJ SOLN
INTRAMUSCULAR | Status: DC | PRN
  Administered 2023-03-17: 100 ug via INTRAVENOUS

## 2023-03-17 MED ORDER — CHLORHEXIDINE GLUCONATE 0.12 % MT SOLN: 15.0000 mL | Freq: Once | OROMUCOSAL | Status: AC

## 2023-03-17 MED ORDER — FENTANYL CITRATE (PF) 250 MCG/5ML IJ SOLN
INTRAMUSCULAR | Status: AC
  Filled 2023-03-17: qty 5

## 2023-03-17 MED ORDER — PHENYLEPHRINE HCL-NACL 20-0.9 MG/250ML-% IV SOLN
INTRAVENOUS | Status: DC | PRN
  Administered 2023-03-17: 15 ug/min via INTRAVENOUS

## 2023-03-17 MED ORDER — PROPOFOL 10 MG/ML IV BOLUS
INTRAVENOUS | Status: AC
  Filled 2023-03-17: qty 20

## 2023-03-17 MED ORDER — OXYCODONE HCL 5 MG PO TABS: 5.0000 mg | ORAL_TABLET | ORAL | 0 refills | Status: DC | PRN

## 2023-03-17 MED ORDER — PROPOFOL 10 MG/ML IV BOLUS
INTRAVENOUS | Status: DC | PRN
  Administered 2023-03-17: 200 mg via INTRAVENOUS

## 2023-03-17 SURGICAL SUPPLY — 60 items
BAG COUNTER SPONGE SURGICOUNT (BAG) ×1 IMPLANT
BLADE CUTTER GATOR 3.5 (BLADE) IMPLANT
BLADE EXCALIBUR 4.0X13 (MISCELLANEOUS) IMPLANT
BLADE SURG 11 STRL SS (BLADE) IMPLANT
BUR OVAL 6.0 (BURR) IMPLANT
DRAPE INCISE IOBAN 66X45 STRL (DRAPES) ×1 IMPLANT
DRAPE STERI 35X30 U-POUCH (DRAPES) ×1 IMPLANT
DRAPE U-SHAPE 47X51 STRL (DRAPES) ×2 IMPLANT
DRSG TEGADERM 4X4.75 (GAUZE/BANDAGES/DRESSINGS) ×4 IMPLANT
DURAPREP 26ML APPLICATOR (WOUND CARE) ×1 IMPLANT
ELECT REM PT RETURN 9FT ADLT (ELECTROSURGICAL) ×1
ELECTRODE REM PT RTRN 9FT ADLT (ELECTROSURGICAL) ×1 IMPLANT
FILTER STRAW FLUID ASPIR (MISCELLANEOUS) ×1 IMPLANT
GAUZE SPONGE 4X4 12PLY STRL (GAUZE/BANDAGES/DRESSINGS) ×1 IMPLANT
GAUZE XEROFORM 1X8 LF (GAUZE/BANDAGES/DRESSINGS) ×1 IMPLANT
GLOVE BIOGEL PI IND STRL 8 (GLOVE) ×1 IMPLANT
GLOVE ECLIPSE 7.0 STRL STRAW (GLOVE) ×1 IMPLANT
GLOVE ECLIPSE 8.0 STRL XLNG CF (GLOVE) ×1 IMPLANT
GLOVE INDICATOR 7.5 STRL GRN (GLOVE) ×1 IMPLANT
GOWN STRL REUS W/ TWL LRG LVL3 (GOWN DISPOSABLE) ×3 IMPLANT
GOWN STRL REUS W/TWL LRG LVL3 (GOWN DISPOSABLE) ×3
KIT BASIN OR (CUSTOM PROCEDURE TRAY) ×1 IMPLANT
KIT TURNOVER KIT B (KITS) ×1 IMPLANT
MANIFOLD NEPTUNE II (INSTRUMENTS) ×1 IMPLANT
NDL HYPO 25GX1X1/2 BEV (NEEDLE) IMPLANT
NDL HYPO 25X1 1.5 SAFETY (NEEDLE) IMPLANT
NDL SPNL 18GX3.5 QUINCKE PK (NEEDLE) ×1 IMPLANT
NDL SUT 6 .5 CRC .975X.05 MAYO (NEEDLE) ×1 IMPLANT
NEEDLE HYPO 25GX1X1/2 BEV (NEEDLE) ×1 IMPLANT
NEEDLE HYPO 25X1 1.5 SAFETY (NEEDLE) IMPLANT
NEEDLE MAYO TAPER (NEEDLE) ×1
NEEDLE SPNL 18GX3.5 QUINCKE PK (NEEDLE) ×1 IMPLANT
NS IRRIG 1000ML POUR BTL (IV SOLUTION) ×1 IMPLANT
PACK SHOULDER (CUSTOM PROCEDURE TRAY) ×1 IMPLANT
PAD ARMBOARD 7.5X6 YLW CONV (MISCELLANEOUS) ×2 IMPLANT
PORT APPOLLO RF 90DEGREE MULTI (SURGICAL WAND) IMPLANT
SLING ARM IMMOBILIZER MED (SOFTGOODS) IMPLANT
SPONGE T-LAP 4X18 ~~LOC~~+RFID (SPONGE) ×2 IMPLANT
STRIP CLOSURE SKIN 1/2X4 (GAUZE/BANDAGES/DRESSINGS) IMPLANT
SUCTION TUBE FRAZIER 10FR DISP (SUCTIONS) IMPLANT
SUT ETHILON 3 0 PS 1 (SUTURE) ×1 IMPLANT
SUT FIBERWIRE 2-0 18 17.9 3/8 (SUTURE)
SUT VIC AB 0 CT1 27 (SUTURE) ×1
SUT VIC AB 0 CT1 27XBRD ANBCTR (SUTURE) ×1 IMPLANT
SUT VIC AB 1 CT1 27 (SUTURE)
SUT VIC AB 1 CT1 27XBRD ANBCTR (SUTURE) IMPLANT
SUT VIC AB 2-0 CT1 27 (SUTURE) ×1
SUT VIC AB 2-0 CT1 TAPERPNT 27 (SUTURE) ×1 IMPLANT
SUT VIC AB 3-0 SH 27 (SUTURE) ×1
SUT VIC AB 3-0 SH 27X BRD (SUTURE) IMPLANT
SUT VIC AB CT1 27XBRD ANBCTRL (SUTURE) ×1
SUT VICRYL 0 UR6 27IN ABS (SUTURE) ×1 IMPLANT
SUTURE FIBERWR 2-0 18 17.9 3/8 (SUTURE) IMPLANT
SYR 20ML LL LF (SYRINGE) ×1 IMPLANT
SYR 3ML LL SCALE MARK (SYRINGE) ×1 IMPLANT
SYR TB 1ML LUER SLIP (SYRINGE) ×1 IMPLANT
TOWEL GREEN STERILE (TOWEL DISPOSABLE) ×1 IMPLANT
TOWEL GREEN STERILE FF (TOWEL DISPOSABLE) ×1 IMPLANT
TUBING ARTHROSCOPY IRRIG 16FT (MISCELLANEOUS) ×1 IMPLANT
WATER STERILE IRR 1000ML POUR (IV SOLUTION) ×1 IMPLANT

## 2023-03-17 NOTE — Transfer of Care (Signed)
Immediate Anesthesia Transfer of Care Note  [name]  Procedure(s) Performed: LEFT SHOULDER ARTHROSCOPY, SUBACROMIAL BURSECTOMY, ARTHROSCOPIC DISTAL CLAVICLE EXCISION (Left: Shoulder)  Patient Location: PACU  Anesthesia Type:General  Level of Consciousness: awake, alert , oriented, and patient cooperative  Airway & Oxygen Therapy: Patient Spontanous Breathing and Patient connected to face mask oxygen  Post-op Assessment: Report given to RN and Post -op Vital signs reviewed and stable  Post vital signs: Reviewed and stable  Last Vitals:  Vitals Value Taken Time  BP 131/83 03/17/23 1515  Temp    Pulse 91 03/17/23 1518  Resp 26 03/17/23 1518  SpO2 90 % 03/17/23 1518  Vitals shown include unfiled device data.  Last Pain:  Vitals:   03/17/23 1245  TempSrc:   PainSc: 0-No pain         Complications:  Encounter Notable Events  Notable Event Outcome Phase Comment  Difficult to intubate - expected  Intraprocedure Filed from anesthesia note documentation.

## 2023-03-17 NOTE — Anesthesia Procedure Notes (Signed)
Anesthesia Regional Block: Interscalene brachial plexus block   Pre-Anesthetic Checklist: , timeout performed,  Correct Patient, Correct Site, Correct Laterality,  Correct Procedure, Correct Position, site marked,  Risks and benefits discussed,  Surgical consent,  Pre-op evaluation,  At surgeon's request and post-op pain management  Laterality: Left  Prep: Dura Prep       Needles:  Injection technique: Single-shot  Needle Type: Echogenic Stimulator Needle     Needle Length: 5cm  Needle Gauge: 20     Additional Needles:   Procedures:,,,, ultrasound used (permanent image in chart),,    Narrative:  Start time: 03/17/2023 12:10 PM End time: 03/17/2023 12:15 PM Injection made incrementally with aspirations every 5 mL.  Performed by: Personally  Anesthesiologist: Atilano Median, DO  Additional Notes: Patient identified. Risks/Benefits/Options discussed with patient including but not limited to bleeding, infection, nerve damage, failed block, incomplete pain control. Patient expressed understanding and wished to proceed. All questions were answered. Sterile technique was used throughout the entire procedure. Please see nursing notes for vital signs. Aspirated in 5cc intervals with injection for negative confirmation. Patient was given instructions on fall risk and not to get out of bed. All questions and concerns addressed with instructions to call with any issues or inadequate analgesia.

## 2023-03-17 NOTE — H&P (Signed)
Scott [name] is an 52 y.o. male.   Chief Complaint: Left shoulder pain HPI:  Scott [name] is a 52 y.o. male who presents  reporting bilateral shoulder pain on the left for 4 months and on the right for several weeks.  Patient states that he has had pain in the left shoulder for 4 to 6 months with no known history of injury.  He is right-hand dominant.  Pain does not wake him from sleep at night.  Pain does radiate to the biceps on that left side.  Denies any numbness and tingling or neck pain.  He has also had an injection into the [facility] joint x 2 with very good relief but that relief has been short-lived.  MRI scan shows edema and arthritis in the distal clavicle on the left-hand side with intact rotator cuff and biceps.  Does report difficulty sleeping on that left-hand side.   Also reports right shoulder pain.  He feels like he may have injured that doing push-ups.  That pain is better since he stopped doing the push-ups.  Had surgery years ago on the right-hand side for distal clavicle excision and rotator cuff bursitis..  Past Medical History:  Diagnosis Date   Arthritis    Carpal tunnel syndrome    Chronic migraine 01/23/2019   Diabetes mellitus    Diastolic heart failure (HCC)    DVT (deep venous thrombosis) (HCC)    left calf   GERD (gastroesophageal reflux disease)    Hypertension    Obesity    Pulmonary embolism (HCC) 2013   He has a history of HTN.  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 A repeat scan done in 2017 did not demonstrate evidence of emboli. Neither study demonstrated any coronary calcifications.    Pulmonary embolism (HCC)    Shoulder pain, bilateral    Sleep apnea    was fitted for mask and never got machine - too costly    Past Surgical History:  Procedure Laterality Date   CARPAL TUNNEL RELEASE Right 12/04/2015   Procedure: RIGHT CARPAL TUNNEL RELEASE;  Surgeon: Betha Loa, MD;  Location: [facility];   Service: Orthopedics;  Laterality: Right;  RIGHT CARPAL TUNNEL RELEASE   CARPAL TUNNEL RELEASE Right 12/2015   [facility]   CARPAL TUNNEL RELEASE Left 04/01/2016   Procedure: LEFT CARPAL TUNNEL RELEASE;  Surgeon: Betha Loa, MD;  Location: [facility];  Service: Orthopedics;  Laterality: Left;   I & D EXTREMITY Left 02/19/2021   Procedure: LEFT FOOT SPUR REMOVAL;  Surgeon: Cammy Copa, MD;  Location: [facility] OR;  Service: Orthopedics;  Laterality: Left;   KNEE ARTHROSCOPY WITH MEDIAL MENISECTOMY Left 02/19/2021   Procedure: LEFT KNEE ARTHROSCOPY, MENISCAL CYST DECOMPRESSION;  Surgeon: Cammy Copa, MD;  Location: [facility] OR;  Service: Orthopedics;  Laterality: Left;   right hand surgery     ROTATOR CUFF REPAIR Right     Family History  Problem Relation Age of Onset   Hypertension Mother    Kidney disease Father    Hypertension Other    Hyperlipidemia Other    Diabetes Other    Social History:  reports that he has never smoked. He has never used smokeless tobacco. He reports that he does not drink alcohol and does not use drugs.  Allergies:  Allergies  Allergen Reactions   Pollen Extract Other (See Comments)    Medications Prior to Admission  Medication Sig Dispense Refill  amLODipine (NORVASC) 10 MG tablet Take 1 tablet (10 mg total) by mouth daily. 90 tablet 3   atorvastatin (LIPITOR) 20 MG tablet Take 1 tablet (20 mg total) by mouth daily. 90 tablet 3   fluticasone (FLONASE) 50 MCG/ACT nasal spray Place 2 sprays into both nostrils daily. (Patient taking differently: Place 2 sprays into both nostrils daily as needed for allergies.) 16 mL 0   JARDIANCE 25 MG TABS tablet Take 1 tablet (25 mg total) by mouth daily. 90 tablet 3   lisinopril (ZESTRIL) 20 MG tablet Take 1 tablet (20 mg total) by mouth daily. 90 tablet 3   loratadine (CLARITIN) 10 MG tablet Take 10 mg by mouth daily as needed for allergies.     metFORMIN (GLUCOPHAGE) 500 MG tablet TAKE 1 TABLET BY MOUTH  TWICE A DAY WITH A MEAL 180 tablet 3   Semaglutide, 2 MG/DOSE, 8 MG/3ML SOPN Inject 2 mg into the skin once a week. 3 mL 5   trolamine salicylate (BLUE-EMU HEMP) 10 % cream Apply 1 application topically as needed for muscle pain.     Aloe Vera OIL by Does not apply route. Blue stop- Emu oil, Aloe oil, Coconut oil     aspirin 81 MG chewable tablet Chew by mouth daily.     glucose blood test strip Use as instructed. Inject into the skin once daily. E11.65 200 each 12    Results for orders placed or performed during the hospital encounter of 03/17/23 (from the past 48 hour(s))  Glucose, capillary     Status: Abnormal   Collection Time: 03/17/23  9:19 AM  Result Value Ref Range   Glucose-Capillary 193 (H) 70 - 99 mg/dL    Comment: Glucose reference range applies only to samples taken after fasting for at least 8 hours.   No results found.  Review of Systems  Musculoskeletal:  Positive for arthralgias.  All other systems reviewed and are negative.   Blood pressure (!) 154/91, pulse 96, temperature 98.9 F (37.2 C), temperature source Oral, resp. rate 18, height 5\' 6"  (1.676 m), weight 104.3 kg, SpO2 95%. Physical Exam Vitals reviewed.  HENT:     Head: Normocephalic.     Nose: Nose normal.     Mouth/Throat:     Mouth: Mucous membranes are moist.  Eyes:     Pupils: Pupils are equal, round, and reactive to light.  Cardiovascular:     Rate and Rhythm: Normal rate.     Pulses: Normal pulses.  Pulmonary:     Effort: Pulmonary effort is normal.  Abdominal:     General: Abdomen is flat.  Musculoskeletal:     Cervical back: Normal range of motion.  Skin:    General: Skin is warm.     Capillary Refill: Capillary refill takes less than 2 seconds.  Neurological:     General: No focal deficit present.     Mental Status: He is alert.  Psychiatric:        Mood and Affect: Mood normal.     Ortho exam demonstrates very good rotator cuff strength infraspinatus supraspinatus and subscap  muscle testing on the left and right-hand side. No restriction of external rotation bilaterally with bilateral range of motion approximately 50/95/170. Does have AC joint tenderness on the left none on the right. Positive O'Brien's testing on the right negative on the left. No coarse grinding or crepitus with internal/external rotation of either shoulder at 90 degrees of abduction. No other masses lymphadenopathy or skin changes noted  in that shoulder girdle region.  Assessment/Plan Impression is left shoulder pain with pain localizing to the [facility] joint on physical exam as well as confirmatory pain generation from the [facility] joint based on good response to 2 injections.  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  Plan is arthroscopy with bursitis and distal clavicle excision arthroscopically.  Risk and benefits are discussed with the patient including not limited to infection or vessel damage shoulder stiffness incomplete pain resolution as well as incomplete restoration of function.  Expected amount of rehab is also discussed.  All questions answered.  Burnard Bunting, MD 03/17/2023, 12:16 PM

## 2023-03-17 NOTE — Anesthesia Procedure Notes (Signed)
Procedure Name: Intubation Date/Time: 03/17/2023 1:25 PM  Performed by: Quincy Carnes, CRNAPre-anesthesia Checklist: Patient identified, Emergency Drugs available, Suction available and Patient being monitored Patient Re-evaluated:Patient Re-evaluated prior to induction Oxygen Delivery Method: Circle System Utilized Preoxygenation: Pre-oxygenation with 100% oxygen Induction Type: IV induction and Rapid sequence Laryngoscope Size: Mac and 4 Grade View: Grade III Tube type: Oral Tube size: 7.5 mm Number of attempts: 1 Airway Equipment and Method: Stylet Placement Confirmation: ETT inserted through vocal cords under direct vision, positive ETCO2 and breath sounds checked- equal and bilateral Tube secured with: Tape Dental Injury: Teeth and Oropharynx as per pre-operative assessment  Difficulty Due To: Difficulty was anticipated and Difficult Airway- due to large tongue

## 2023-03-17 NOTE — Op Note (Signed)
NAME: Scott [name], WEGRZYN MEDICAL RECORD NO: [id number] ACCOUNT NO: [id number] DATE OF BIRTH: [birth year] FACILITY: [facility] LOCATION: [facility] PHYSICIAN: Graylin Shiver. 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  PROCEDURE:  Left shoulder arthroscopy with subacromial bursectomy and arthroscopic distal clavicle excision.  SURGEON:  Graylin Shiver.  August Saucer, MD  ASSISTANT:  Karenann Cai, PA  INDICATIONS:  This is a 51 year old patient with left shoulder pain refractory to nonoperative management.  He had good results with two AC joint injections, but those results were short-lived.  He presents now for operative management after explanation  of risks and benefits.  DESCRIPTION OF PROCEDURE:  The patient was brought to the operating room where general anesthetic was induced.  Preoperative antibiotics were administered.  Timeout was called.  The patient was placed in the beach chair position with the head in neutral  position.  The left shoulder, arm, and hand were prescrubbed with alcohol and Betadine, which was allowed to air dry and then prepped and draped in a usual sterile manner.  Collier Flowers was used to cover the axilla and seal the operative field.  After calling  timeout, posterior portal was created 2 cm medial and inferior to the posterolateral margin of the acromion.  Diagnostic arthroscopy was performed.  Anterior portal was created in line with the distal end of the clavicle.  It should be noted that we  injected the subacromial space with 10 mL of saline with epinephrine prior to the procedure.  Inside the joint, there was mild fraying of the biceps tendon, which was debrided.  Glenohumeral articular surfaces intact.  Rotator cuff intact.  Anterior,  inferior, and posterior inferior glenohumeral ligament intact.  Next, after limited  debridement inside the joint, the scope was placed in the subacromial space.  A lateral portal was created under direct visualization.  Subacromial bursectomy was  performed.  At this time, the anterior portal was slightly extended and using a burr the distal 10 mm of the clavicle was excised.  The superior ligaments and posterior ligaments were maintained.  Good clavicle stability was present and good  decompression was confirmed visually.  Thorough irrigation was performed.  The instruments were removed and the portals were closed using 3-0 nylon and 3-0 Vicryl.  Impervious dressings and a shoulder immobilizer placed.  The patient tolerated the  procedure well without immediate complications.   PUS D: 03/17/2023 2:49:57 pm T: 03/17/2023 3:33:00 pm  JOB: [id number]/ [id number]

## 2023-03-17 NOTE — Brief Op Note (Signed)
   03/17/2023  2:50 PM  PATIENT:  Scott [name]  52 y.o. male  PRE-OPERATIVE DIAGNOSIS:  left shoulder bursitis, acromioclavicular arthritis  POST-OPERATIVE DIAGNOSIS:  left shoulder bursitis, acromioclavicular arthritis  PROCEDURE:  Procedure(s): LEFT SHOULDER ARTHROSCOPY, SUBACROMIAL BURSECTOMY, ARTHROSCOPIC DISTAL CLAVICLE EXCISION  SURGEON:  Surgeon(s): August Saucer, Corrie Mckusick, MD  ASSISTANT: magnant pa  ANESTHESIA:   general  EBL: 10 ml    Total I/O In: 200 [I.V.:200] Out: 10 [Blood:10]  BLOOD ADMINISTERED: none  DRAINS: none   LOCAL MEDICATIONS USED:  none  SPECIMEN:  No Specimen  COUNTS:  YES  TOURNIQUET:  * No tourniquets in log *  DICTATION: .Other Dictation: Dictation Number done  PLAN OF CARE: Discharge to home after PACU  PATIENT DISPOSITION:  PACU - hemodynamically stable

## 2023-03-18 ENCOUNTER — Other Ambulatory Visit: Payer: Self-pay

## 2023-03-18 ENCOUNTER — Telehealth: Payer: Self-pay | Admitting: Physical Medicine and Rehabilitation

## 2023-03-18 NOTE — Telephone Encounter (Signed)
Pt returned call to Imogene Burn. Please call pt at [phone].

## 2023-03-18 NOTE — Anesthesia Postprocedure Evaluation (Signed)
Anesthesia Post Note  [name]  Procedure(s) Performed: LEFT SHOULDER ARTHROSCOPY, SUBACROMIAL BURSECTOMY, ARTHROSCOPIC DISTAL CLAVICLE EXCISION (Left: Shoulder)     Patient location during evaluation: PACU Anesthesia Type: General and Regional Level of consciousness: awake and alert Pain management: pain level controlled Vital Signs Assessment: post-procedure vital signs reviewed and stable Respiratory status: spontaneous breathing, nonlabored ventilation, respiratory function stable and patient connected to nasal cannula oxygen Cardiovascular status: blood pressure returned to baseline and stable Postop Assessment: no apparent nausea or vomiting Anesthetic complications: yes   Encounter Notable Events  Notable Event Outcome Phase Comment  Difficult to intubate - expected  Intraprocedure Filed from anesthesia note documentation.    Last Vitals:  Vitals:   03/17/23 1545 03/17/23 1600  BP: (!) 154/85 (!) 151/88  Pulse: 87 86  Resp: 17 (!) 21  Temp:  36.6 C  SpO2: 92% 94%    Last Pain:  Vitals:   03/17/23 1600  TempSrc:   PainSc: 0-No pain                 Earl Lites P Fawzi Melman

## 2023-03-22 ENCOUNTER — Telehealth: Payer: Self-pay

## 2023-03-22 ENCOUNTER — Other Ambulatory Visit (HOSPITAL_COMMUNITY): Payer: Self-pay

## 2023-03-22 NOTE — Telephone Encounter (Signed)
Pharmacy Patient Advocate Encounter  Received notification from [facility] that Prior Authorization for Ozempic 2mg /dose has been APPROVED from (unable to obtain due to approval in [facility]) Unable to obtain price due to refill too soon rejection, last fill date N/A next available fill date 03/28/2023   PA #/Case ID/Reference #: N/A

## 2023-03-24 ENCOUNTER — Encounter: Payer: Self-pay | Admitting: Orthopedic Surgery

## 2023-03-24 ENCOUNTER — Ambulatory Visit (INDEPENDENT_AMBULATORY_CARE_PROVIDER_SITE_OTHER): Payer: 59 | Admitting: Surgical

## 2023-03-24 DIAGNOSIS — M25511 Pain in right shoulder: Secondary | ICD-10-CM

## 2023-03-24 NOTE — Progress Notes (Signed)
Post-Op Visit Note   [name]           Date of Birth: 12/26/1970           MRN: [id number] Visit Date: 03/24/2023 PCP: Georgina Quint, MD   Assessment & Plan:  Chief Complaint:  Chief Complaint  Patient presents with   Left Shoulder - Routine Post Op   Visit Diagnoses: No diagnosis found.  Plan: Patient is a 52 year old male who presents s/p left shoulder arthroscopy with distal clavicle excision.  He is about a week out.  Doing well.  Using CPM machine.  He has discontinued oxycodone and is just taking ibuprofen.  Using sling as directed.  Not lifting anything with the operative arm.  No fevers or chills.  On exam, patient has 40 degrees X rotation, 95 degrees abduction, 150 degrees forward elevation passively compared with the right shoulder which has 45 degrees X rotation, 105 degrees abduction, 175 degrees forward elevation passively and actively.  He has axillary nerve intact with deltoid firing in both shoulders.  Intact EPL, FPL, finger abduction, pronation/supination, bicep, tricep, deltoid.  Excellent rotator cuff strength of both shoulders rated 5/5.  2+ radial pulse of the operative extremity.  Incisions are healing well with sutures intact.  Sutures removed and replaced with Steri-Strips today.  Plan with the left shoulder is discontinue sling.  He will use the CPM machine and do home exercise program in order to optimize his range of motion.  Okay for light lifting but no lifting more than about 5 pounds or else he may exacerbate his shoulder pain.  Will see him back in 4 weeks for clinical recheck.  Regarding the right shoulder, he does report right shoulder pain localizing to the superior aspect of the shoulder.  He has history of prior distal clavicle excision that was done years ago.  Last MRI imaging of the right shoulder was done about 10 years ago and last radiographic imaging of the right shoulder was done earlier this year with no significant  degenerative changes of the glenohumeral joint but did show evidence of prior distal clavicle excision.  On exam today, he has excellent rotator cuff strength of the right shoulder but there is negative O'Brien sign and no tenderness over the bicipital groove.  He does have tenderness overlying the right AC joint moderately despite the decompression.  We discussed options available to patient and he would like to have repeat MRI in order to ensure that there is no rotator cuff pathology which is his main concern given that he woke up from sleep with increased pain about 2 months ago.  If MRI is without any operative pathology, could try AC joint injection.  Follow-up after MRI.  Follow-Up Instructions: No follow-ups on file.   Orders:  No orders of the defined types were placed in this encounter.  No orders of the defined types were placed in this encounter.   Imaging: No results found.  PMFS History: Patient Active Problem List   Diagnosis Date Noted   Osteoarthritis of right knee 03/16/2023   GERD (gastroesophageal reflux disease) 03/31/2021   History of colonic polyps 11/21/2020   Chronic left shoulder pain 11/20/2020   Hyperlipidemia 04/23/2020   Class 2 severe obesity due to excess calories with serious comorbidity and body mass index (BMI) of 37.0 to 37.9 in adult [facility]) 02/13/2019   Sleep apnea 01/23/2019   Chronic diastolic heart failure (HCC) 11/07/2016   OSA (obstructive sleep apnea) 04/18/2012  Left ventricular diastolic dysfunction, NYHA class 1/moderate LVH with concentric hypertrophy 09/03/2011   Dyslipidemia associated with type 2 diabetes mellitus (HCC) 09/02/2011   Hypertension associated with diabetes (HCC) 09/02/2011   Past Medical History:  Diagnosis Date   Arthritis    Carpal tunnel syndrome    Chronic migraine 01/23/2019   Diabetes mellitus    Diastolic heart failure (HCC)    DVT (deep venous thrombosis) (HCC)    left calf   GERD (gastroesophageal reflux  disease)    Hypertension    Obesity    Pulmonary embolism (HCC) 2013   He has a history of HTN.  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 A repeat scan done in 2017 did not demonstrate evidence of emboli. Neither study demonstrated any coronary calcifications.    Pulmonary embolism (HCC)    Shoulder pain, bilateral    Sleep apnea    was fitted for mask and never got machine - too costly    Family History  Problem Relation Age of Onset   Hypertension Mother    Kidney disease Father    Hypertension Other    Hyperlipidemia Other    Diabetes Other     Past Surgical History:  Procedure Laterality Date   CARPAL TUNNEL RELEASE Right 12/04/2015   Procedure: RIGHT CARPAL TUNNEL RELEASE;  Surgeon: Betha Loa, MD;  Location: [facility];  Service: Orthopedics;  Laterality: Right;  RIGHT CARPAL TUNNEL RELEASE   CARPAL TUNNEL RELEASE Right 12/2015   [facility]   CARPAL TUNNEL RELEASE Left 04/01/2016   Procedure: LEFT CARPAL TUNNEL RELEASE;  Surgeon: Betha Loa, MD;  Location: [facility];  Service: Orthopedics;  Laterality: Left;   I & D EXTREMITY Left 02/19/2021   Procedure: LEFT FOOT SPUR REMOVAL;  Surgeon: Cammy Copa, MD;  Location: [facility] OR;  Service: Orthopedics;  Laterality: Left;   KNEE ARTHROSCOPY WITH MEDIAL MENISECTOMY Left 02/19/2021   Procedure: LEFT KNEE ARTHROSCOPY, MENISCAL CYST DECOMPRESSION;  Surgeon: Cammy Copa, MD;  Location: [facility] OR;  Service: Orthopedics;  Laterality: Left;   right hand surgery     ROTATOR CUFF REPAIR Right    Social History   Occupational History   Occupation: real estate agent  Tobacco Use   Smoking status: Never   Smokeless tobacco: Never  Vaping Use   Vaping status: Never Used  Substance and Sexual Activity   Alcohol use: No    Alcohol/week: 0.0 standard drinks of alcohol   Drug use: No   Sexual activity: Yes

## 2023-03-25 ENCOUNTER — Encounter: Payer: Self-pay | Admitting: Orthopedic Surgery

## 2023-03-27 DIAGNOSIS — M19012 Primary osteoarthritis, left shoulder: Secondary | ICD-10-CM

## 2023-03-28 ENCOUNTER — Ambulatory Visit (INDEPENDENT_AMBULATORY_CARE_PROVIDER_SITE_OTHER): Payer: 59 | Admitting: Physical Medicine and Rehabilitation

## 2023-03-28 ENCOUNTER — Other Ambulatory Visit: Payer: Self-pay

## 2023-03-28 DIAGNOSIS — M47816 Spondylosis without myelopathy or radiculopathy, lumbar region: Secondary | ICD-10-CM

## 2023-03-28 MED ORDER — METHYLPREDNISOLONE ACETATE 40 MG/ML IJ SUSP
40.0000 mg | Freq: Once | INTRAMUSCULAR | Status: AC
  Administered 2023-03-28: 40 mg

## 2023-03-28 NOTE — Procedures (Signed)
Lumbar Facet Joint Intra-Articular Injection(s) with Fluoroscopic Guidance  [name]      Date of Birth: [birth year] MRN: [id number] PCP: Georgina Quint, MD      Visit Date: 03/28/2023   Universal Protocol:    Date/Time: 03/28/2023  Consent Given By: the patient  Position: PRONE   Additional Comments: Vital signs were monitored before and after the procedure. Patient was prepped and draped in the usual sterile fashion. The correct patient, procedure, and site was verified.   Injection Procedure Details:  Procedure Site One Meds Administered:  Meds ordered this encounter  Medications   methylPREDNISolone acetate (DEPO-MEDROL) injection 40 mg     Laterality: Bilateral  Location/Site:  L4-L5  Needle size: 22 guage  Needle type: Spinal  Needle Placement: Articular  Findings:  -Comments: Excellent flow of contrast producing a partial arthrogram.  The right sided facet joint at this level is very arthritic.  There was good flow contrast producing arthrogram but did not have as much in feel as the left which clearly was intra-articular.  Procedure Details: The fluoroscope beam is vertically oriented in AP, and the inferior recess is visualized beneath the lower pole of the inferior apophyseal process, which represents the target point for needle insertion. When direct visualization is difficult the target point is located at the medial projection of the vertebral pedicle. The region overlying each aforementioned target is locally anesthetized with a 1 to 2 ml. volume of 1% Lidocaine without Epinephrine.   The spinal needle was inserted into each of the above mentioned facet joints using biplanar fluoroscopic guidance. A 0.25 to 0.5 ml. volume of Isovue-250 was injected and a partial facet joint arthrogram was obtained. A single spot film was obtained of the resulting arthrogram.    One to 1.25 ml of the steroid/anesthetic solution was then injected into  each of the facet joints noted above.   Additional Comments:  No complications occurred Dressing: 2 x 2 sterile gauze and Band-Aid    Post-procedure details: Patient was observed during the procedure. Post-procedure instructions were reviewed.  Patient left the clinic in stable condition.

## 2023-03-28 NOTE — Patient Instructions (Signed)

## 2023-03-28 NOTE — Progress Notes (Signed)
Functional Pain Scale - descriptive words and definitions  Moderate (4)   Constantly aware of pain, can complete ADLs with modification/sleep marginally affected at times/passive distraction is of no use, but active distraction gives some relief. Moderate range order  Average Pain 4   +Driver, -BT, -Dye Allergies.

## 2023-03-28 NOTE — Progress Notes (Signed)
KEONA COOTS - 52 y.o. male MRN [id number]  Date of birth: 1970/06/19  Office Visit Note: Visit Date: 03/28/2023 PCP: Georgina Quint, MD Referred by: London Sheer, MD  Subjective: Chief Complaint  Patient presents with   Lower Back - Pain   HPI:  BRELON VENTEICHER is a 52 y.o. male who comes in today at the request of Dr. Willia Craze for planned Bilateral  L4-5 Lumbar facet/medial branch block with fluoroscopic guidance.  The patient has failed conservative care including home exercise, medications, time and activity modification.  This injection will be diagnostic and hopefully therapeutic.  Please see requesting physician notes for further details and justification.  Exam has shown concordant pain with facet joint loading.  Prior radiofrequency ablation did help just not very long and prior injections have helped up to 3 months of this was repeated today.  Please note his right L4-5 facet joint is very arthritic.   ROS Otherwise per HPI.  Assessment & Plan: Visit Diagnoses:    ICD-10-CM   1. Spondylosis without myelopathy or radiculopathy, lumbar region  M47.816 XR C-ARM NO REPORT    Facet Injection    methylPREDNISolone acetate (DEPO-MEDROL) injection 40 mg      Plan: No additional findings.   Meds & Orders:  Meds ordered this encounter  Medications   methylPREDNISolone acetate (DEPO-MEDROL) injection 40 mg    Orders Placed This Encounter  Procedures   Facet Injection   XR C-ARM NO REPORT    Follow-up: No follow-ups on file.   Procedures: No procedures performed  Lumbar Facet Joint Intra-Articular Injection(s) with Fluoroscopic Guidance  Patient: INIGO COOPERSTEIN      Date of Birth: 1970/05/11 MRN: [id number] PCP: Georgina Quint, MD      Visit Date: 03/28/2023   Universal Protocol:    Date/Time: 03/28/2023  Consent Given By: the patient  Position: PRONE   Additional Comments: Vital signs were monitored before and after the  procedure. Patient was prepped and draped in the usual sterile fashion. The correct patient, procedure, and site was verified.   Injection Procedure Details:  Procedure Site One Meds Administered:  Meds ordered this encounter  Medications   methylPREDNISolone acetate (DEPO-MEDROL) injection 40 mg     Laterality: Bilateral  Location/Site:  L4-L5  Needle size: 22 guage  Needle type: Spinal  Needle Placement: Articular  Findings:  -Comments: Excellent flow of contrast producing a partial arthrogram.  The right sided facet joint at this level is very arthritic.  There was good flow contrast producing arthrogram but did not have as much in feel as the left which clearly was intra-articular.  Procedure Details: The fluoroscope beam is vertically oriented in AP, and the inferior recess is visualized beneath the lower pole of the inferior apophyseal process, which represents the target point for needle insertion. When direct visualization is difficult the target point is located at the medial projection of the vertebral pedicle. The region overlying each aforementioned target is locally anesthetized with a 1 to 2 ml. volume of 1% Lidocaine without Epinephrine.   The spinal needle was inserted into each of the above mentioned facet joints using biplanar fluoroscopic guidance. A 0.25 to 0.5 ml. volume of Isovue-250 was injected and a partial facet joint arthrogram was obtained. A single spot film was obtained of the resulting arthrogram.    One to 1.25 ml of the steroid/anesthetic solution was then injected into each of the facet joints noted above.   Additional Comments:  No complications occurred Dressing: 2 x 2 sterile gauze and Band-Aid    Post-procedure details: Patient was observed during the procedure. Post-procedure instructions were reviewed.  Patient left the clinic in stable condition.    Clinical History: MRI LUMBAR SPINE WITHOUT CONTRAST     TECHNIQUE:   Multiplanar, multisequence MR imaging of the lumbar spine was  performed. No intravenous contrast was administered.     COMPARISON:  Prior radiograph from 01/16/2020 and MRI from  02/05/2017.     FINDINGS:  Segmentation:  Standard.     Alignment:  Physiologic.  No listhesis.     Vertebrae: Vertebral body height maintained without acute or chronic  fracture. Bone marrow signal intensity within normal limits. No  discrete or worrisome osseous lesions. Mild reactive marrow edema  noted about the right greater than left L4-5 facets due to facet  arthritis. No other abnormal marrow edema.     Conus medullaris and cauda equina: Conus extends to the L1 level.  Conus and cauda equina appear normal.     Paraspinal and other soft tissues: Unremarkable.     Disc levels:     L1-2:  Unremarkable.     L2-3:  Unremarkable.     L3-4: Normal interspace. Minimal facet spurring. No canal or  foraminal stenosis. No impingement.     L4-5: Disc desiccation. Shallow broad-based left subarticular disc  protrusion indents the left ventral thecal sac, encroaching upon the  left lateral recess. Associated central annular fissure. Moderate  bilateral facet hypertrophy, mildly progressed from previous.  Resultant mild canal with moderate left lateral recess stenosis,  descending L5 nerve root level. Mild to moderate bilateral L4  foraminal narrowing. Appearance is overall relatively similar to  previous.     L5-S1: Normal interspace. Moderate right with mild left facet  hypertrophy, mildly progressed from previous. No stenosis or  impingement.     IMPRESSION:  1. Shallow left subarticular disc protrusion at L4-5 with resultant  mild canal and moderate left lateral recess stenosis, potentially  affecting the descending left L5 nerve root.  2. Moderate bilateral facet hypertrophy at L4-5 and L5-S1, mildly  progressed from previous, and could contribute to lower back pain.        Electronically  Signed    By: Rise Mu M.D.    On: 01/27/2020 01:46     Objective:  VS:  HT:    WT:   BMI:     BP:   HR: bpm  TEMP: ( )  RESP:  Physical Exam Vitals and nursing note reviewed.  Constitutional:      General: He is not in acute distress.    Appearance: Normal appearance. He is not ill-appearing.  HENT:     Head: Normocephalic and atraumatic.     Right Ear: External ear normal.     Left Ear: External ear normal.     Nose: No congestion.  Eyes:     Extraocular Movements: Extraocular movements intact.  Cardiovascular:     Rate and Rhythm: Normal rate.     Pulses: Normal pulses.  Pulmonary:     Effort: Pulmonary effort is normal. No respiratory distress.  Abdominal:     General: There is no distension.     Palpations: Abdomen is soft.  Musculoskeletal:        General: No tenderness or signs of injury.     Cervical back: Neck supple.     Right lower leg: No edema.     Left lower leg:  No edema.     Comments: Patient has good distal strength without clonus.  Skin:    Findings: No erythema or rash.  Neurological:     General: No focal deficit present.     Mental Status: He is alert and oriented to person, place, and time.     Sensory: No sensory deficit.     Motor: No weakness or abnormal muscle tone.     Coordination: Coordination normal.  Psychiatric:        Mood and Affect: Mood normal.        Behavior: Behavior normal.      Imaging: No results found.

## 2023-03-29 ENCOUNTER — Other Ambulatory Visit: Payer: Self-pay | Admitting: Surgical

## 2023-03-29 ENCOUNTER — Telehealth: Payer: Self-pay | Admitting: Orthopedic Surgery

## 2023-03-29 ENCOUNTER — Ambulatory Visit: Payer: 59 | Admitting: Orthopedic Surgery

## 2023-03-29 MED ORDER — HYDROCODONE-ACETAMINOPHEN 5-325 MG PO TABS: 1.0000 | ORAL_TABLET | Freq: Three times a day (TID) | ORAL | 0 refills | Status: DC | PRN

## 2023-03-29 NOTE — Telephone Encounter (Signed)
Pt called requesting  hydrocodone. Pt states the oxycodone is too strong and the pharmacist recommend switching to hydro. Please call pt about this matter at [phone]. Pt pharmacy is [address]

## 2023-03-29 NOTE — Telephone Encounter (Signed)
Sent in

## 2023-03-29 NOTE — Telephone Encounter (Signed)
Lvm advising pt.

## 2023-04-14 ENCOUNTER — Ambulatory Visit (INDEPENDENT_AMBULATORY_CARE_PROVIDER_SITE_OTHER): Payer: 59 | Admitting: Orthopedic Surgery

## 2023-04-14 ENCOUNTER — Other Ambulatory Visit: Payer: Self-pay | Admitting: Orthopedic Surgery

## 2023-04-14 ENCOUNTER — Telehealth: Payer: Self-pay | Admitting: Orthopedic Surgery

## 2023-04-14 DIAGNOSIS — L97421 Non-pressure chronic ulcer of left heel and midfoot limited to breakdown of skin: Secondary | ICD-10-CM

## 2023-04-14 DIAGNOSIS — M79672 Pain in left foot: Secondary | ICD-10-CM | POA: Diagnosis not present

## 2023-04-14 MED ORDER — HYDROCODONE-ACETAMINOPHEN 5-325 MG PO TABS: 1.0000 | ORAL_TABLET | Freq: Three times a day (TID) | ORAL | 0 refills | Status: DC | PRN

## 2023-04-14 NOTE — Telephone Encounter (Signed)
Pt came in asking for refill of hydrocodne. Please send to [address] [place]. Pt phone number is [phone].

## 2023-04-14 NOTE — Telephone Encounter (Signed)
Sent thx

## 2023-04-15 ENCOUNTER — Encounter: Payer: Self-pay | Admitting: Orthopedic Surgery

## 2023-04-15 NOTE — Progress Notes (Signed)
Office Visit Note   [name]           Date of Birth: 1971/03/27           MRN: [id number] Visit Date: 04/14/2023              Requested by: Georgina Quint, MD [address] [place],  [place] [address] PCP: Georgina Quint, MD  Chief Complaint  Patient presents with   Left Foot - Follow-up      HPI: Patient is a 52 year old gentleman who is seen for hypertrophic callus lateral aspect left heel.  Patient is status post previous debridement with Dr. August Saucer.  Patient states that is rubbing in his shoe.  Assessment & Plan: Visit Diagnoses:  1. Pain of left heel   2. 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  Recommended a softer heel counter to unload pressure from the heel.  Follow-Up Instructions: Return if symptoms worsen or fail to improve.   Ortho Exam  Patient is alert, oriented, no adenopathy, well-dressed, normal affect, normal respiratory effort. Examination radiographs show no bony abnormalities.  Patient has hypertrophic callus lateral aspect of the left heel.  After informed consent a 10 blade knife was used to pare the callus.  There is no deep abscess or ulcer.  The tissue is healthy.  Patient had good improvement of his symptoms when he resumed his shoe wear.  He has a very stiff heel counter shoes.  Most recent hemoglobin A1c is 7.9.  Imaging: No results found. No images are attached to the encounter.  Labs: Lab Results  Component Value Date   HGBA1C 7.9 (H) 02/24/2023   HGBA1C 7.2 (A) 09/13/2022   HGBA1C 7.0 (A) 06/14/2022     Lab Results  Component Value Date   ALBUMIN 4.7 02/24/2023   ALBUMIN 4.4 09/13/2022   ALBUMIN 4.6 01/05/2022    No results found for: "MG" No results found for: "VD25OH"  No results found for: "PREALBUMIN"    Latest Ref Rng & Units 03/10/2023    3:36 PM 02/24/2023    3:04 PM 09/13/2022     3:48 PM  CBC EXTENDED  WBC 4.0 - 10.5 K/uL 4.9  4.7  5.8   RBC 4.22 - 5.81 MIL/uL 6.93  6.61  6.41   Hemoglobin 13.0 - 17.0 g/dL 95.2  84.1  32.4   HCT 39.0 - 52.0 % 55.8  52.7  50.8   Platelets 150 - 400 K/uL 250  280.0  245.0   NEUT# 1.4 - 7.7 K/uL  1.8  2.7   Lymph# 0.7 - 4.0 K/uL  2.4  2.3      There is no height or weight on file to calculate BMI.  Orders:  No orders of the defined types were placed in this encounter.  No orders of the defined types were placed in this encounter.    Procedures: No procedures performed  Clinical Data: No additional findings.  ROS:  All other systems negative, except as noted in the HPI. Review of Systems  Objective: Vital Signs: There were no vitals taken for this visit.  Specialty Comments:  MRI LUMBAR SPINE WITHOUT CONTRAST     TECHNIQUE:  Multiplanar, multisequence MR imaging of the lumbar spine was  performed. No intravenous contrast was administered.     COMPARISON:  Prior radiograph from 01/16/2020 and MRI  from  02/05/2017.     FINDINGS:  Segmentation:  Standard.     Alignment:  Physiologic.  No listhesis.     Vertebrae: Vertebral body height maintained without acute or chronic  fracture. Bone marrow signal intensity within normal limits. No  discrete or worrisome osseous lesions. Mild reactive marrow edema  noted about the right greater than left L4-5 facets due to facet  arthritis. No other abnormal marrow edema.     Conus medullaris and cauda equina: Conus extends to the L1 level.  Conus and cauda equina appear normal.     Paraspinal and other soft tissues: Unremarkable.     Disc levels:     L1-2:  Unremarkable.     L2-3:  Unremarkable.     L3-4: Normal interspace. Minimal facet spurring. No canal or  foraminal stenosis. No impingement.     L4-5: Disc desiccation. Shallow broad-based left subarticular disc  protrusion indents the left ventral thecal sac, encroaching upon the  left lateral recess.  Associated central annular fissure. Moderate  bilateral facet hypertrophy, mildly progressed from previous.  Resultant mild canal with moderate left lateral recess stenosis,  descending L5 nerve root level. Mild to moderate bilateral L4  foraminal narrowing. Appearance is overall relatively similar to  previous.     L5-S1: Normal interspace. Moderate right with mild left facet  hypertrophy, mildly progressed from previous. No stenosis or  impingement.     IMPRESSION:  1. Shallow left subarticular disc protrusion at L4-5 with resultant  mild canal and moderate left lateral recess stenosis, potentially  affecting the descending left L5 nerve root.  2. Moderate bilateral facet hypertrophy at L4-5 and L5-S1, mildly  progressed from previous, and could contribute to lower back pain.        Electronically Signed    By: Rise Mu M.D.    On: 01/27/2020 01:46  PMFS History: Patient Active Problem List   Diagnosis Date Noted   Arthritis of left acromioclavicular joint 03/27/2023   Osteoarthritis of right knee 03/16/2023   GERD (gastroesophageal reflux disease) 03/31/2021   History of colonic polyps 11/21/2020   Chronic left shoulder pain 11/20/2020   Hyperlipidemia 04/23/2020   Class 2 severe obesity due to excess calories with serious comorbidity and body mass index (BMI) of 37.0 to 37.9 in adult [facility]) 02/13/2019   Sleep apnea 01/23/2019   Chronic diastolic heart failure (HCC) 11/07/2016   OSA (obstructive sleep apnea) 04/18/2012   Left ventricular diastolic dysfunction, NYHA class 1/moderate LVH with concentric hypertrophy 09/03/2011   Dyslipidemia associated with type 2 diabetes mellitus (HCC) 09/02/2011   Hypertension associated with diabetes (HCC) 09/02/2011   Past Medical History:  Diagnosis Date   Arthritis    Carpal tunnel syndrome    Chronic migraine 01/23/2019   Diabetes mellitus    Diastolic heart failure (HCC)    DVT (deep venous thrombosis) (HCC)    left  calf   GERD (gastroesophageal reflux disease)    Hypertension    Obesity    Pulmonary embolism (HCC) 2013   He has a history of HTN.  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 A repeat scan done in 2017 did not demonstrate evidence of emboli. Neither study demonstrated any coronary calcifications.    Pulmonary embolism (HCC)    Shoulder pain, bilateral    Sleep apnea    was fitted for mask and never got machine - too costly    Family History  Problem Relation Age  of Onset   Hypertension Mother    Kidney disease Father    Hypertension Other    Hyperlipidemia Other    Diabetes Other     Past Surgical History:  Procedure Laterality Date   CARPAL TUNNEL RELEASE Right 12/04/2015   Procedure: RIGHT CARPAL TUNNEL RELEASE;  Surgeon: Betha Loa, MD;  Location: [facility];  Service: Orthopedics;  Laterality: Right;  RIGHT CARPAL TUNNEL RELEASE   CARPAL TUNNEL RELEASE Right 12/2015   [facility]   CARPAL TUNNEL RELEASE Left 04/01/2016   Procedure: LEFT CARPAL TUNNEL RELEASE;  Surgeon: Betha Loa, MD;  Location: [facility];  Service: Orthopedics;  Laterality: Left;   I & D EXTREMITY Left 02/19/2021   Procedure: LEFT FOOT SPUR REMOVAL;  Surgeon: Cammy Copa, MD;  Location: [facility] OR;  Service: Orthopedics;  Laterality: Left;   KNEE ARTHROSCOPY WITH MEDIAL MENISECTOMY Left 02/19/2021   Procedure: LEFT KNEE ARTHROSCOPY, MENISCAL CYST DECOMPRESSION;  Surgeon: Cammy Copa, MD;  Location: [facility];  Service: Orthopedics;  Laterality: Left;   right hand surgery     ROTATOR CUFF REPAIR Right    Social History   Occupational History   Occupation: real estate agent  Tobacco Use   Smoking status: Never   Smokeless tobacco: Never  Vaping Use   Vaping status: Never Used  Substance and Sexual Activity   Alcohol use: No    Alcohol/week: 0.0 standard drinks of alcohol   Drug use: No   Sexual activity: Yes

## 2023-04-21 ENCOUNTER — Encounter: Payer: Self-pay | Admitting: Orthopedic Surgery

## 2023-04-21 ENCOUNTER — Ambulatory Visit: Payer: 59 | Admitting: Orthopedic Surgery

## 2023-04-21 DIAGNOSIS — M7542 Impingement syndrome of left shoulder: Secondary | ICD-10-CM

## 2023-04-21 NOTE — Progress Notes (Signed)
Post-Op Visit Note   [name]           Date of Birth: 03-10-1971           MRN: [id number] Visit Date: 04/21/2023 PCP: Georgina Quint, MD   Assessment & Plan:  Chief Complaint:  Chief Complaint  Patient presents with   Left Shoulder - Follow-up    Left shoulder scope 03/17/2023   Visit Diagnoses:  1. Impingement syndrome of left shoulder     Plan: Scott [name] is a patient underwent left shoulder arthroscopy 03/17/2023 with distal clavicle excision.  Finished CPM machine.  On examination he is got range of motion of 60/100/160.  A little bit of clicking around the [facility] joint but not particularly tender.  Plan at this time is let him start lifting groceries to bring his hand.  Cannot sleep on the left-hand side yet.  Coming back in January for knee injection.  AP clavicle next visit.  Follow-Up Instructions: No follow-ups on file.   Orders:  No orders of the defined types were placed in this encounter.  No orders of the defined types were placed in this encounter.   Imaging: No results found.  PMFS History: Patient Active Problem List   Diagnosis Date Noted   Arthritis of left acromioclavicular joint 03/27/2023   Osteoarthritis of right knee 03/16/2023   GERD (gastroesophageal reflux disease) 03/31/2021   History of colonic polyps 11/21/2020   Chronic left shoulder pain 11/20/2020   Hyperlipidemia 04/23/2020   Class 2 severe obesity due to excess calories with serious comorbidity and body mass index (BMI) of 37.0 to 37.9 in adult [facility]) 02/13/2019   Sleep apnea 01/23/2019   Chronic diastolic heart failure (HCC) 11/07/2016   OSA (obstructive sleep apnea) 04/18/2012   Left ventricular diastolic dysfunction, NYHA class 1/moderate LVH with concentric hypertrophy 09/03/2011   Dyslipidemia associated with type 2 diabetes mellitus (HCC) 09/02/2011   Hypertension associated with diabetes (HCC) 09/02/2011   Past Medical History:  Diagnosis Date   Arthritis     Carpal tunnel syndrome    Chronic migraine 01/23/2019   Diabetes mellitus    Diastolic heart failure (HCC)    DVT (deep venous thrombosis) (HCC)    left calf   GERD (gastroesophageal reflux disease)    Hypertension    Obesity    Pulmonary embolism (HCC) 2013   He has a history of HTN.  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 A repeat scan done in 2017 did not demonstrate evidence of emboli. Neither study demonstrated any coronary calcifications.    Pulmonary embolism (HCC)    Shoulder pain, bilateral    Sleep apnea    was fitted for mask and never got machine - too costly    Family History  Problem Relation Age of Onset   Hypertension Mother    Kidney disease Father    Hypertension Other    Hyperlipidemia Other    Diabetes Other     Past Surgical History:  Procedure Laterality Date   CARPAL TUNNEL RELEASE Right 12/04/2015   Procedure: RIGHT CARPAL TUNNEL RELEASE;  Surgeon: Betha Loa, MD;  Location: [facility];  Service: Orthopedics;  Laterality: Right;  RIGHT CARPAL TUNNEL RELEASE   CARPAL TUNNEL RELEASE Right 12/2015   [facility]   CARPAL TUNNEL RELEASE Left 04/01/2016   Procedure: LEFT CARPAL TUNNEL RELEASE;  Surgeon: Betha Loa, MD;  Location: [facility];  Service: Orthopedics;  Laterality: Left;  I & D EXTREMITY Left 02/19/2021   Procedure: LEFT FOOT SPUR REMOVAL;  Surgeon: Cammy Copa, MD;  Location: [facility] OR;  Service: Orthopedics;  Laterality: Left;   KNEE ARTHROSCOPY WITH MEDIAL MENISECTOMY Left 02/19/2021   Procedure: LEFT KNEE ARTHROSCOPY, MENISCAL CYST DECOMPRESSION;  Surgeon: Cammy Copa, MD;  Location: [facility] OR;  Service: Orthopedics;  Laterality: Left;   right hand surgery     ROTATOR CUFF REPAIR Right    Social History   Occupational History   Occupation: real estate agent  Tobacco Use   Smoking status: Never   Smokeless tobacco: Never  Vaping Use   Vaping status: Never Used   Substance and Sexual Activity   Alcohol use: No    Alcohol/week: 0.0 standard drinks of alcohol   Drug use: No   Sexual activity: Yes

## 2023-04-25 ENCOUNTER — Ambulatory Visit
Admission: RE | Admit: 2023-04-25 | Discharge: 2023-04-25 | Disposition: A | Discharge status: Home / Self Care | Payer: 59 | Source: Ambulatory Visit | Attending: Orthopedic Surgery | Admitting: Orthopedic Surgery

## 2023-04-25 DIAGNOSIS — M25511 Pain in right shoulder: Secondary | ICD-10-CM

## 2023-05-19 LAB — HM DIABETES EYE EXAM

## 2023-06-02 ENCOUNTER — Ambulatory Visit: Payer: 59 | Admitting: Emergency Medicine

## 2023-06-02 ENCOUNTER — Ambulatory Visit: Payer: 59 | Admitting: Orthopedic Surgery

## 2023-06-02 ENCOUNTER — Encounter: Payer: Self-pay | Admitting: Emergency Medicine

## 2023-06-02 VITALS — BP 124/84 | HR 93 | Temp 98.5°F | Ht 66.0 in | Wt 232.2 lb

## 2023-06-02 DIAGNOSIS — Z7985 Long-term (current) use of injectable non-insulin antidiabetic drugs: Secondary | ICD-10-CM

## 2023-06-02 DIAGNOSIS — E785 Hyperlipidemia, unspecified: Secondary | ICD-10-CM | POA: Diagnosis not present

## 2023-06-02 DIAGNOSIS — I152 Hypertension secondary to endocrine disorders: Secondary | ICD-10-CM | POA: Diagnosis not present

## 2023-06-02 DIAGNOSIS — E1169 Type 2 diabetes mellitus with other specified complication: Secondary | ICD-10-CM | POA: Diagnosis not present

## 2023-06-02 DIAGNOSIS — E1159 Type 2 diabetes mellitus with other circulatory complications: Secondary | ICD-10-CM

## 2023-06-02 DIAGNOSIS — Z7984 Long term (current) use of oral hypoglycemic drugs: Secondary | ICD-10-CM

## 2023-06-02 LAB — POCT GLYCOSYLATED HEMOGLOBIN (HGB A1C): Hemoglobin A1C: 7.3 % — AB (ref 4.0–5.6)

## 2023-06-02 NOTE — Assessment & Plan Note (Signed)
BP Readings from Last 3 Encounters:  06/02/23 124/84  03/17/23 (!) 151/88  03/16/23 132/84  Well-controlled hypertension Continue amlodipine 10 mg and lisinopril 20 mg daily Hemoglobin A1c still not at goal but better than before 7.3 Recommend to continue metformin 500 mg twice a day along with weekly Ozempic 2 mg and daily Jardiance 25 mg Cardiovascular risks associated with uncontrolled diabetes discussed Diet and nutrition discussed Benefits of exercise discussed Follow-up in 3 months

## 2023-06-02 NOTE — Progress Notes (Signed)
Scott [name] 53 y.o.   Chief Complaint  Patient presents with   Medical Management of Chronic Issues    HISTORY OF PRESENT ILLNESS: This is a 53 y.o. male here for 41-month follow-up of chronic medical conditions including diabetes and hypertension Lab Results  Component Value Date   HGBA1C 7.9 (H) 02/24/2023   Wt Readings from Last 3 Encounters:  06/02/23 232 lb 3.2 oz (105.3 kg)  03/17/23 230 lb (104.3 kg)  03/16/23 230 lb (104.3 kg)   Wt Readings from Last 3 Encounters:  06/02/23 232 lb 3.2 oz (105.3 kg)  03/17/23 230 lb (104.3 kg)  03/16/23 230 lb (104.3 kg)     HPI   Prior to Admission medications   Medication Sig Start Date End Date Taking? Authorizing Provider  Aloe Vera OIL by Does not apply route. Blue stop- Emu oil, Aloe oil, Coconut oil   Yes [provider]  amLODipine (NORVASC) 10 MG tablet Take 1 tablet (10 mg total) by mouth daily. 03/02/23  Yes Georgina Quint, MD  aspirin 81 MG chewable tablet Chew by mouth daily.   Yes [provider]  atorvastatin (LIPITOR) 20 MG tablet Take 1 tablet (20 mg total) by mouth daily. 09/13/22 09/08/23 Yes Naszir Cott, Eilleen Kempf, MD  fluticasone [facility]) 50 MCG/ACT nasal spray Place 2 sprays into both nostrils daily. Patient taking differently: Place 2 sprays into both nostrils daily as needed for allergies. 10/08/20  Yes CovingtonMaralyn Sago M, PA-C  glucose blood test strip Use as instructed. Inject into the skin once daily. E11.65 11/05/20  Yes Mayers, Cari S, PA-C  HYDROcodone-acetaminophen (NORCO/VICODIN) 5-325 MG tablet Take 1 tablet by mouth every 8 (eight) hours as needed for moderate pain (pain score 4-6). 04/14/23 04/13/24 Yes Dean, Corrie Mckusick, MD  JARDIANCE 25 MG TABS tablet Take 1 tablet (25 mg total) by mouth daily. 03/02/23  Yes Thomasa Heidler, Eilleen Kempf, MD  lisinopril (ZESTRIL) 20 MG tablet Take 1 tablet (20 mg total) by mouth daily. 03/02/23 02/25/24 Yes Fue Cervenka, Eilleen Kempf, MD  loratadine  (CLARITIN) 10 MG tablet Take 10 mg by mouth daily as needed for allergies.   Yes [provider]  metFORMIN (GLUCOPHAGE) 500 MG tablet TAKE 1 TABLET BY MOUTH TWICE A DAY WITH A MEAL 02/13/23  Yes Eara Burruel, Eilleen Kempf, MD  methocarbamol (ROBAXIN) 500 MG tablet Take 1 tablet (500 mg total) by mouth every 8 (eight) hours as needed for muscle spasms. 03/17/23  Yes Magnant, Charles L, PA-C  Semaglutide, 2 MG/DOSE, 8 MG/3ML SOPN Inject 2 mg into the skin once a week. 03/02/23  Yes Georgina Quint, MD    Allergies  Allergen Reactions   Pollen Extract Other (See Comments)    Patient Active Problem List   Diagnosis Date Noted   Arthritis of left acromioclavicular joint 03/27/2023   Osteoarthritis of right knee 03/16/2023   GERD (gastroesophageal reflux disease) 03/31/2021   History of colonic polyps 11/21/2020   Chronic left shoulder pain 11/20/2020   Hyperlipidemia 04/23/2020   Class 2 severe obesity due to excess calories with serious comorbidity and body mass index (BMI) of 37.0 to 37.9 in adult (HCC) 02/13/2019   Sleep apnea 01/23/2019   Chronic diastolic heart failure (HCC) 11/07/2016   OSA (obstructive sleep apnea) 04/18/2012   Left ventricular diastolic dysfunction, NYHA class 1/moderate LVH with concentric hypertrophy 09/03/2011   Dyslipidemia associated with type 2 diabetes mellitus (HCC) 09/02/2011   Hypertension associated with diabetes (HCC) 09/02/2011    Past Medical History:  Diagnosis Date   Arthritis    Carpal tunnel syndrome    Chronic migraine 01/23/2019   Diabetes mellitus    Diastolic heart failure (HCC)    DVT (deep venous thrombosis) (HCC)    left calf   GERD (gastroesophageal reflux disease)    Hypertension    Obesity    Pulmonary embolism (HCC) 2013   He has a history of HTN.  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 A repeat scan done in 2017 did not demonstrate evidence of emboli. Neither study demonstrated any  coronary calcifications.    Pulmonary embolism (HCC)    Shoulder pain, bilateral    Sleep apnea    was fitted for mask and never got machine - too costly    Past Surgical History:  Procedure Laterality Date   CARPAL TUNNEL RELEASE Right 12/04/2015   Procedure: RIGHT CARPAL TUNNEL RELEASE;  Surgeon: Betha Loa, MD;  Location: [facility];  Service: Orthopedics;  Laterality: Right;  RIGHT CARPAL TUNNEL RELEASE   CARPAL TUNNEL RELEASE Right 12/2015   [facility]   CARPAL TUNNEL RELEASE Left 04/01/2016   Procedure: LEFT CARPAL TUNNEL RELEASE;  Surgeon: Betha Loa, MD;  Location: [facility];  Service: Orthopedics;  Laterality: Left;   I & D EXTREMITY Left 02/19/2021   Procedure: LEFT FOOT SPUR REMOVAL;  Surgeon: Cammy Copa, MD;  Location: [facility] OR;  Service: Orthopedics;  Laterality: Left;   KNEE ARTHROSCOPY WITH MEDIAL MENISECTOMY Left 02/19/2021   Procedure: LEFT KNEE ARTHROSCOPY, MENISCAL CYST DECOMPRESSION;  Surgeon: Cammy Copa, MD;  Location: [facility] OR;  Service: Orthopedics;  Laterality: Left;   right hand surgery     ROTATOR CUFF REPAIR Right     Social History   Socioeconomic History   Marital status: Married    Spouse name: Not on file   Number of children: Not on file   Years of education: 12+   Highest education level: Not on file  Occupational History   Occupation: real estate agent  Tobacco Use   Smoking status: Never   Smokeless tobacco: Never  Vaping Use   Vaping status: Never Used  Substance and Sexual Activity   Alcohol use: No    Alcohol/week: 0.0 standard drinks of alcohol   Drug use: No   Sexual activity: Yes  Other Topics Concern   Not on file  Social History Narrative   Lives at home.   Right-handed.   Occasional caffeine use.   Tea every other day   Social Drivers of Corporate investment banker Strain: Not on file  Food Insecurity: Not on file  Transportation Needs: Not on file  Physical Activity: Not on file   Stress: Not on file  Social Connections: Not on file  Intimate Partner Violence: Not on file    Family History  Problem Relation Age of Onset   Hypertension Mother    Kidney disease Father    Hypertension Other    Hyperlipidemia Other    Diabetes Other      Review of Systems  Constitutional: Negative.  Negative for chills and fever.  HENT: Negative.  Negative for congestion and sore throat.   Respiratory: Negative.  Negative for cough and shortness of breath.   Cardiovascular: Negative.  Negative for chest pain and palpitations.  Gastrointestinal:  Negative for abdominal pain, diarrhea, nausea and vomiting.  Genitourinary: Negative.  Negative for dysuria and hematuria.  Skin: Negative.  Negative for rash.  Neurological: Negative.  Negative for dizziness and headaches.  All other systems reviewed and are negative.   Today's Vitals   06/02/23 1356  BP: 124/84  Pulse: 93  Temp: 98.5 F (36.9 C)  TempSrc: Oral  SpO2: 96%  Weight: 232 lb 3.2 oz (105.3 kg)  Height: 5\' 6"  (1.676 m)   Body mass index is 37.48 kg/m.   Physical Exam Vitals reviewed.  Constitutional:      Appearance: Normal appearance.  HENT:     Head: Normocephalic.  Eyes:     Extraocular Movements: Extraocular movements intact.  Cardiovascular:     Rate and Rhythm: Normal rate.  Pulmonary:     Effort: Pulmonary effort is normal.  Skin:    General: Skin is warm and dry.  Neurological:     Mental Status: He is alert and oriented to person, place, and time.  Psychiatric:        Mood and Affect: Mood normal.        Behavior: Behavior normal.    Results for orders placed or performed in visit on 06/02/23 (from the past 24 hours)  POCT glycosylated hemoglobin (Hb A1C)     Status: Abnormal   Collection Time: 06/02/23  2:15 PM  Result Value Ref Range   Hemoglobin A1C 7.3 (A) 4.0 - 5.6 %   HbA1c POC (<> result, manual entry)     HbA1c, POC (prediabetic range)     HbA1c, POC (controlled diabetic  range)       ASSESSMENT & PLAN: A total of 43 minutes was spent with the patient and counseling/coordination of care regarding preparing for this visit, review of most recent office visit notes, review of multiple chronic medical conditions and their management, cardiovascular risks associated with hypertension diabetes and dyslipidemia, review of all medications, review of most recent bloodwork results including interpretation of today's hemoglobin A1c, review of health maintenance items, education on nutrition, prognosis, documentation, and need for follow up.   Problem List Items Addressed This Visit       Cardiovascular and Mediastinum   Hypertension associated with diabetes (HCC) - Primary   BP Readings from Last 3 Encounters:  06/02/23 124/84  03/17/23 (!) 151/88  03/16/23 132/84  Well-controlled hypertension Continue amlodipine 10 mg and lisinopril 20 mg daily Hemoglobin A1c still not at goal but better than before 7.3 Recommend to continue metformin 500 mg twice a day along with weekly Ozempic 2 mg and daily Jardiance 25 mg Cardiovascular risks associated with uncontrolled diabetes discussed Diet and nutrition discussed Benefits of exercise discussed Follow-up in 3 months       Relevant Orders   POCT glycosylated hemoglobin (Hb A1C) (Completed)     Endocrine   Dyslipidemia associated with type 2 diabetes mellitus (HCC)   Chronic stable conditions but hemoglobin A1c still not at goal Continue metformin and Jardiance Continue weekly Ozempic 2 mg Continue atorvastatin 20 mg daily Diet and nutrition discussed Follow-up in 3 months        Relevant Orders   POCT glycosylated hemoglobin (Hb A1C) (Completed)   Patient Instructions  Diabetes Mellitus and Nutrition, Adult When you have diabetes, or diabetes mellitus, it is very important to have healthy eating habits because your blood sugar (glucose) levels are greatly affected by what you eat and drink. Eating healthy  foods in the right amounts, at about the same times every day, can help you: Manage your blood glucose. Lower your risk of heart disease. Improve your blood pressure. Reach or maintain  a healthy weight. What can affect my meal plan? Every person with diabetes is different, and each person has different needs for a meal plan. Your health care provider may recommend that you work with a dietitian to make a meal plan that is best for you. Your meal plan may vary depending on factors such as: The calories you need. The medicines you take. Your weight. Your blood glucose, blood pressure, and cholesterol levels. Your activity level. Other health conditions you have, such as heart or kidney disease. How do carbohydrates affect me? Carbohydrates, also called carbs, affect your blood glucose level more than any other type of food. Eating carbs raises the amount of glucose in your blood. It is important to know how many carbs you can safely have in each meal. This is different for every person. Your dietitian can help you calculate how many carbs you should have at each meal and for each snack. How does alcohol affect me? Alcohol can cause a decrease in blood glucose (hypoglycemia), especially if you use insulin or take certain diabetes medicines by mouth. Hypoglycemia can be a life-threatening condition. Symptoms of hypoglycemia, such as sleepiness, dizziness, and confusion, are similar to symptoms of having too much alcohol. Do not drink alcohol if: Your health care provider tells you not to drink. You are pregnant, may be pregnant, or are planning to become pregnant. If you drink alcohol: Limit how much you have to: 0-1 drink a day for women. 0-2 drinks a day for men. Know how much alcohol is in your drink. In the [place] one drink equals one 12 oz bottle of beer (355 mL), one 5 oz glass of wine (148 mL), or one 1 oz glass of hard liquor (44 mL). Keep yourself hydrated with water, diet soda, or  unsweetened iced tea. Keep in mind that regular soda, juice, and other mixers may contain a lot of sugar and must be counted as carbs. What are tips for following this plan?  Reading food labels Start by checking the serving size on the Nutrition Facts label of packaged foods and drinks. The number of calories and the amount of carbs, fats, and other nutrients listed on the label are based on one serving of the item. Many items contain more than one serving per package. Check the total grams (g) of carbs in one serving. Check the number of grams of saturated fats and trans fats in one serving. Choose foods that have a low amount or none of these fats. Check the number of milligrams (mg) of salt (sodium) in one serving. Most people should limit total sodium intake to less than 2,300 mg per day. Always check the nutrition information of foods labeled as "low-fat" or "nonfat." These foods may be higher in added sugar or refined carbs and should be avoided. Talk to your dietitian to identify your daily goals for nutrients listed on the label. Shopping Avoid buying canned, pre-made, or processed foods. These foods tend to be high in fat, sodium, and added sugar. Shop around the outside edge of the grocery store. This is where you will most often find fresh fruits and vegetables, bulk grains, fresh meats, and fresh dairy products. Cooking Use low-heat cooking methods, such as baking, instead of high-heat cooking methods, such as deep frying. Cook using healthy oils, such as olive, canola, or sunflower oil. Avoid cooking with butter, cream, or high-fat meats. Meal planning Eat meals and snacks regularly, preferably at the same times every day. Avoid going long periods  of time without eating. Eat foods that are high in fiber, such as fresh fruits, vegetables, beans, and whole grains. Eat 4-6 oz (112-168 g) of lean protein each day, such as lean meat, chicken, fish, eggs, or tofu. One ounce (oz) (28 g) of  lean protein is equal to: 1 oz (28 g) of meat, chicken, or fish. 1 egg.  cup (62 g) of tofu. Eat some foods each day that contain healthy fats, such as avocado, nuts, seeds, and fish. What foods should I eat? Fruits Berries. Apples. Oranges. Peaches. Apricots. Plums. Grapes. Mangoes. Papayas. Pomegranates. Kiwi. Cherries. Vegetables Leafy greens, including lettuce, spinach, kale, chard, collard greens, mustard greens, and cabbage. Beets. Cauliflower. Broccoli. Carrots. Green beans. Tomatoes. Peppers. Onions. Cucumbers. Brussels sprouts. Grains Whole grains, such as whole-wheat or whole-grain bread, crackers, tortillas, cereal, and pasta. Unsweetened oatmeal. Quinoa. Brown or wild rice. Meats and other proteins Seafood. Poultry without skin. Lean cuts of poultry and beef. Tofu. Nuts. Seeds. Dairy Low-fat or fat-free dairy products such as milk, yogurt, and cheese. The items listed above may not be a complete list of foods and beverages you can eat and drink. Contact a dietitian for more information. What foods should I avoid? Fruits Fruits canned with syrup. Vegetables Canned vegetables. Frozen vegetables with butter or cream sauce. Grains Refined white flour and flour products such as bread, pasta, snack foods, and cereals. Avoid all processed foods. Meats and other proteins Fatty cuts of meat. Poultry with skin. Breaded or fried meats. Processed meat. Avoid saturated fats. Dairy Full-fat yogurt, cheese, or milk. Beverages Sweetened drinks, such as soda or iced tea. The items listed above may not be a complete list of foods and beverages you should avoid. Contact a dietitian for more information. Questions to ask a health care provider Do I need to meet with a certified diabetes care and education specialist? Do I need to meet with a dietitian? What number can I call if I have questions? When are the best times to check my blood glucose? Where to find more information: American  Diabetes Association: diabetes.org Academy of Nutrition and Dietetics: eatright.[employer] of Diabetes and Digestive and Kidney Diseases: StageSync.si Association of Diabetes Care & Education Specialists: diabeteseducator.org Summary It is important to have healthy eating habits because your blood sugar (glucose) levels are greatly affected by what you eat and drink. It is important to use alcohol carefully. A healthy meal plan will help you manage your blood glucose and lower your risk of heart disease. Your health care provider may recommend that you work with a dietitian to make a meal plan that is best for you. This information is not intended to replace advice given to you by your health care provider. Make sure you discuss any questions you have with your health care provider. Document Revised: 11/20/2019 Document Reviewed: 11/21/2019 Elsevier Patient Education  2024 Elsevier Inc.     Edwina Barth, MD  Primary Care at [facility]

## 2023-06-02 NOTE — Patient Instructions (Signed)

## 2023-06-02 NOTE — Assessment & Plan Note (Signed)
Chronic stable conditions but hemoglobin A1c still not at goal Continue metformin and Jardiance Continue weekly Ozempic 2 mg Continue atorvastatin 20 mg daily Diet and nutrition discussed Follow-up in 3 months

## 2023-06-15 ENCOUNTER — Ambulatory Visit (INDEPENDENT_AMBULATORY_CARE_PROVIDER_SITE_OTHER): Payer: 59 | Admitting: Orthopedic Surgery

## 2023-06-15 ENCOUNTER — Other Ambulatory Visit (INDEPENDENT_AMBULATORY_CARE_PROVIDER_SITE_OTHER): Payer: Self-pay

## 2023-06-15 DIAGNOSIS — M7542 Impingement syndrome of left shoulder: Secondary | ICD-10-CM

## 2023-06-15 DIAGNOSIS — M1712 Unilateral primary osteoarthritis, left knee: Secondary | ICD-10-CM | POA: Diagnosis not present

## 2023-06-17 ENCOUNTER — Encounter: Payer: Self-pay | Admitting: Orthopedic Surgery

## 2023-06-17 NOTE — Progress Notes (Signed)
 Office Visit Note   [name]           Date of Birth: 03/05/1971           MRN: [id number] Visit Date: 06/15/2023 Requested by: Georgina Quint, MD [id number] [place],  [place] [address] PCP: Georgina Quint, MD  Subjective: Chief Complaint  Patient presents with   Left Shoulder - Routine Post Op    03/17/23 left shoulder scope DCE    HPI: Scott [name] is a 53 y.o. male who presents to the office reporting left shoulder pain and left knee pain.  He underwent left shoulder arthroscopy and distal clavicle excision on 03/17/2023.  He also is presenting for left knee injection for known arthritis in that left knee.  His shoulder is feeling good most of the time.  Has an occasional ache in the shoulder.  Shoveling snow was painful for the shoulder but only temporarily.  His knee is stable and continues to respond reasonably well to episodic injections..                ROS: All systems reviewed are negative as they relate to the chief complaint within the history of present illness.  Patient denies fevers or chills.  Assessment & Plan: Visit Diagnoses:  1. Impingement syndrome of left shoulder     Plan: Impression is good shoulder function and range of motion following left shoulder arthroscopic distal clavicle excision.  Has a little bit of spurring in the superior aspect of the clavicle but that space remains very well decompressed.  Left knee injection performed today as well.  Will see him back in 3 to 4 months for consideration of another knee injection is always a continue to help.  Continue with nonweightbearing quad strengthening exercises as well.  Follow-Up Instructions: No follow-ups on file.   Orders:  Orders Placed This Encounter  Procedures   XR Clavicle Left   No orders of the defined types were placed in this encounter.     Procedures: Large Joint Inj: L knee on 06/15/2023 8:26 AM Indications: diagnostic evaluation, joint  swelling and pain Details: 18 G 1.5 in needle, superolateral approach  Arthrogram: No  Medications: 5 mL lidocaine 1 %; 40 mg methylPREDNISolone acetate 40 MG/ML; 4 mL bupivacaine 0.25 % Outcome: tolerated well, no immediate complications Procedure, treatment alternatives, risks and benefits explained, specific risks discussed. Consent was given by the patient. Immediately prior to procedure a time out was called to verify the correct patient, procedure, equipment, support staff and site/side marked as required. Patient was prepped and draped in the usual sterile fashion.       Clinical Data: No additional findings.  Objective: Vital Signs: There were no vitals taken for this visit.  Physical Exam:  Constitutional: Patient appears well-developed HEENT:  Head: Normocephalic Eyes:EOM are normal Neck: Normal range of motion Cardiovascular: Normal rate Pulmonary/chest: Effort normal Neurologic: Patient is alert Skin: Skin is warm Psychiatric: Patient has normal mood and affect  Ortho Exam: Ortho exam demonstrates no effusion in the left knee.  Range of motion is intact with mild medial joint line tenderness.  Extensor mechanism is intact.  Collateral crucial ligaments are stable.  Left shoulder demonstrates good range of motion with mild superior tenderness but no crepitus in the region of the [facility] joint resection.  Rotator cuff strength is intact in that left shoulder region.  Specialty Comments:  MRI LUMBAR SPINE WITHOUT CONTRAST     TECHNIQUE:  Multiplanar, multisequence MR imaging of the lumbar spine was  performed. No intravenous contrast was administered.     COMPARISON:  Prior radiograph from 01/16/2020 and MRI from  02/05/2017.     FINDINGS:  Segmentation:  Standard.     Alignment:  Physiologic.  No listhesis.     Vertebrae: Vertebral body height maintained without acute or chronic  fracture. Bone marrow signal intensity within normal limits. No  discrete or  worrisome osseous lesions. Mild reactive marrow edema  noted about the right greater than left L4-5 facets due to facet  arthritis. No other abnormal marrow edema.     Conus medullaris and cauda equina: Conus extends to the L1 level.  Conus and cauda equina appear normal.     Paraspinal and other soft tissues: Unremarkable.     Disc levels:     L1-2:  Unremarkable.     L2-3:  Unremarkable.     L3-4: Normal interspace. Minimal facet spurring. No canal or  foraminal stenosis. No impingement.     L4-5: Disc desiccation. Shallow broad-based left subarticular disc  protrusion indents the left ventral thecal sac, encroaching upon the  left lateral recess. Associated central annular fissure. Moderate  bilateral facet hypertrophy, mildly progressed from previous.  Resultant mild canal with moderate left lateral recess stenosis,  descending L5 nerve root level. Mild to moderate bilateral L4  foraminal narrowing. Appearance is overall relatively similar to  previous.     L5-S1: Normal interspace. Moderate right with mild left facet  hypertrophy, mildly progressed from previous. No stenosis or  impingement.     IMPRESSION:  1. Shallow left subarticular disc protrusion at L4-5 with resultant  mild canal and moderate left lateral recess stenosis, potentially  affecting the descending left L5 nerve root.  2. Moderate bilateral facet hypertrophy at L4-5 and L5-S1, mildly  progressed from previous, and could contribute to lower back pain.        Electronically Signed    By: Rise Mu M.D.    On: 01/27/2020 01:46  Imaging: No results found.   PMFS History: Patient Active Problem List   Diagnosis Date Noted   Arthritis of left acromioclavicular joint 03/27/2023   Osteoarthritis of right knee 03/16/2023   GERD (gastroesophageal reflux disease) 03/31/2021   History of colonic polyps 11/21/2020   Chronic left shoulder pain 11/20/2020   Hyperlipidemia 04/23/2020   Class 2  severe obesity due to excess calories with serious comorbidity and body mass index (BMI) of 37.0 to 37.9 in adult [facility]) 02/13/2019   Sleep apnea 01/23/2019   Chronic diastolic heart failure (HCC) 11/07/2016   OSA (obstructive sleep apnea) 04/18/2012   Left ventricular diastolic dysfunction, NYHA class 1/moderate LVH with concentric hypertrophy 09/03/2011   Dyslipidemia associated with type 2 diabetes mellitus (HCC) 09/02/2011   Hypertension associated with diabetes (HCC) 09/02/2011   Past Medical History:  Diagnosis Date   Arthritis    Carpal tunnel syndrome    Chronic migraine 01/23/2019   Diabetes mellitus    Diastolic heart failure (HCC)    DVT (deep venous thrombosis) (HCC)    left calf   GERD (gastroesophageal reflux disease)    Hypertension    Obesity    Pulmonary embolism (HCC) 2013   He has a history of HTN.  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 A repeat scan done in 2017 did not demonstrate evidence of emboli. Neither study demonstrated any coronary calcifications.    Pulmonary embolism (HCC)  Shoulder pain, bilateral    Sleep apnea    was fitted for mask and never got machine - too costly    Family History  Problem Relation Age of Onset   Hypertension Mother    Kidney disease Father    Hypertension Other    Hyperlipidemia Other    Diabetes Other     Past Surgical History:  Procedure Laterality Date   CARPAL TUNNEL RELEASE Right 12/04/2015   Procedure: RIGHT CARPAL TUNNEL RELEASE;  Surgeon: Betha Loa, MD;  Location: [facility];  Service: Orthopedics;  Laterality: Right;  RIGHT CARPAL TUNNEL RELEASE   CARPAL TUNNEL RELEASE Right 12/2015   [facility]   CARPAL TUNNEL RELEASE Left 04/01/2016   Procedure: LEFT CARPAL TUNNEL RELEASE;  Surgeon: Betha Loa, MD;  Location: [facility];  Service: Orthopedics;  Laterality: Left;   I & D EXTREMITY Left 02/19/2021   Procedure: LEFT FOOT SPUR REMOVAL;  Surgeon:  Cammy Copa, MD;  Location: [facility] OR;  Service: Orthopedics;  Laterality: Left;   KNEE ARTHROSCOPY WITH MEDIAL MENISECTOMY Left 02/19/2021   Procedure: LEFT KNEE ARTHROSCOPY, MENISCAL CYST DECOMPRESSION;  Surgeon: Cammy Copa, MD;  Location[facility] OR;  Service: Orthopedics;  Laterality: Left;   right hand surgery     ROTATOR CUFF REPAIR Right    Social History   Occupational History   Occupation: real estate agent  Tobacco Use   Smoking status: Never   Smokeless tobacco: Never  Vaping Use   Vaping status: Never Used  Substance and Sexual Activity   Alcohol use: No    Alcohol/week: 0.0 standard drinks of alcohol   Drug use: No   Sexual activity: Yes

## 2023-06-19 MED ORDER — BUPIVACAINE HCL 0.25 % IJ SOLN
4.0000 mL | INTRAMUSCULAR | Status: AC | PRN
  Administered 2023-06-15: 4 mL via INTRA_ARTICULAR

## 2023-06-19 MED ORDER — LIDOCAINE HCL 1 % IJ SOLN
5.0000 mL | INTRAMUSCULAR | Status: AC | PRN
  Administered 2023-06-15: 5 mL

## 2023-06-19 MED ORDER — METHYLPREDNISOLONE ACETATE 40 MG/ML IJ SUSP
40.0000 mg | INTRAMUSCULAR | Status: AC | PRN
  Administered 2023-06-15: 40 mg via INTRA_ARTICULAR

## 2023-06-29 ENCOUNTER — Other Ambulatory Visit: Payer: Self-pay

## 2023-06-29 ENCOUNTER — Ambulatory Visit (INDEPENDENT_AMBULATORY_CARE_PROVIDER_SITE_OTHER): Payer: 59 | Admitting: Orthopedic Surgery

## 2023-06-29 DIAGNOSIS — M25521 Pain in right elbow: Secondary | ICD-10-CM

## 2023-06-29 DIAGNOSIS — M7701 Medial epicondylitis, right elbow: Secondary | ICD-10-CM | POA: Diagnosis not present

## 2023-06-30 ENCOUNTER — Encounter: Payer: Self-pay | Admitting: Orthopedic Surgery

## 2023-06-30 NOTE — Progress Notes (Signed)
 Office Visit Note   [name]           Date of Birth: January 03, 1971           MRN: [id number] Visit Date: 06/29/2023 Requested by: Georgina Quint, MD [address] [place],  [place] [address] PCP: Georgina Quint, MD  Subjective: Chief Complaint  Patient presents with   Right Elbow - Pain    HPI: Scott [name] is a 53 y.o. male who presents to the office reporting right elbow pain.  Has pain localizing to the medial condyle consistent with "golfers elbow".  Has had 1 prior injection done several years ago..  That injection gave him good relief.  He is right-hand dominant.  Pain comes and goes.  Patient is diabetic.  Also having a little bit of pain in that left [facility] joint region which is now about 3 months out from distal clavicle excision.  Denies much in the way of numbness or tingling in the hand in the ulnar distribution on the right-hand side.              ROS: All systems reviewed are negative as they relate to the chief complaint within the history of present illness.  Patient denies fevers or chills.  Assessment & Plan: Visit Diagnoses:  1. Pain in right elbow     Plan: Impression is tendinosis of the common flexor origin.  Injection performed today.  Essentially that was done because it has helped him in the past several years ago.  For future injections I would favor PRP over cortisone.  I think it would also be reasonable to consider injection into the [facility] joint but hesitate to do 2 injections in the same day in a diabetic patient.  We will bring him back in a week or 2 for a planned injection into the left [facility] joint under ultrasound guidance.  We can see how the elbow has fared at that time as well.  Follow-Up Instructions: No follow-ups on file.   Orders:  Orders Placed This Encounter  Procedures   US Guided Needle Placement - No Linked Charges   No orders of the defined types were placed in this encounter.     Procedures: Hand/UE  Inj: R elbow for medial epicondylitis on 06/29/2023 1:04 PM Indications: therapeutic and pain Details: 25 G needle, ultrasound-guided medial approach Medications: 3 mL lidocaine 1 %; 0.66 mL bupivacaine 0.5 %; 13.33 mg methylPREDNISolone acetate 40 MG/ML Outcome: tolerated well, no immediate complications Procedure, treatment alternatives, risks and benefits explained, specific risks discussed. Immediately prior to procedure a time out was called to verify the correct patient, procedure, equipment, support staff and site/side marked as required. Patient was prepped and draped in the usual sterile fashion.       Clinical Data: No additional findings.  Objective: Vital Signs: There were no vitals taken for this visit.  Physical Exam:  Constitutional: Patient appears well-developed HEENT:  Head: Normocephalic Eyes:EOM are normal Neck: Normal range of motion Cardiovascular: Normal rate Pulmonary/chest: Effort normal Neurologic: Patient is alert Skin: Skin is warm Psychiatric: Patient has normal mood and affect  Ortho Exam: Ortho exam demonstrates full range of motion of the right elbow.  EPL FPL interosseous strength is intact.  Does have tenderness around that medial epicondyle which is slightly worse with wrist flexion.  No instability or tenderness in the cubital tunnel of the ulnar nerve.  No lateral sided tenderness around the lateral epicondyle.  Specialty Comments:  MRI LUMBAR SPINE WITHOUT CONTRAST     TECHNIQUE:  Multiplanar, multisequence MR imaging of the lumbar spine was  performed. No intravenous contrast was administered.     COMPARISON:  Prior radiograph from 01/16/2020 and MRI from  02/05/2017.     FINDINGS:  Segmentation:  Standard.     Alignment:  Physiologic.  No listhesis.     Vertebrae: Vertebral body height maintained without acute or chronic  fracture. Bone marrow signal intensity within normal limits. No  discrete or worrisome osseous lesions. Mild  reactive marrow edema  noted about the right greater than left L4-5 facets due to facet  arthritis. No other abnormal marrow edema.     Conus medullaris and cauda equina: Conus extends to the L1 level.  Conus and cauda equina appear normal.     Paraspinal and other soft tissues: Unremarkable.     Disc levels:     L1-2:  Unremarkable.     L2-3:  Unremarkable.     L3-4: Normal interspace. Minimal facet spurring. No canal or  foraminal stenosis. No impingement.     L4-5: Disc desiccation. Shallow broad-based left subarticular disc  protrusion indents the left ventral thecal sac, encroaching upon the  left lateral recess. Associated central annular fissure. Moderate  bilateral facet hypertrophy, mildly progressed from previous.  Resultant mild canal with moderate left lateral recess stenosis,  descending L5 nerve root level. Mild to moderate bilateral L4  foraminal narrowing. Appearance is overall relatively similar to  previous.     L5-S1: Normal interspace. Moderate right with mild left facet  hypertrophy, mildly progressed from previous. No stenosis or  impingement.     IMPRESSION:  1. Shallow left subarticular disc protrusion at L4-5 with resultant  mild canal and moderate left lateral recess stenosis, potentially  affecting the descending left L5 nerve root.  2. Moderate bilateral facet hypertrophy at L4-5 and L5-S1, mildly  progressed from previous, and could contribute to lower back pain.        Electronically Signed    By: Rise Mu M.D.    On: 01/27/2020 01:46  Imaging: No results found.   PMFS History: Patient Active Problem List   Diagnosis Date Noted   Arthritis of left acromioclavicular joint 03/27/2023   Osteoarthritis of right knee 03/16/2023   GERD (gastroesophageal reflux disease) 03/31/2021   History of colonic polyps 11/21/2020   Chronic left shoulder pain 11/20/2020   Hyperlipidemia 04/23/2020   Class 2 severe obesity due to excess  calories with serious comorbidity and body mass index (BMI) of 37.0 to 37.9 in adult [facility]) 02/13/2019   Sleep apnea 01/23/2019   Chronic diastolic heart failure (HCC) 11/07/2016   OSA (obstructive sleep apnea) 04/18/2012   Left ventricular diastolic dysfunction, NYHA class 1/moderate LVH with concentric hypertrophy 09/03/2011   Dyslipidemia associated with type 2 diabetes mellitus (HCC) 09/02/2011   Hypertension associated with diabetes (HCC) 09/02/2011   Past Medical History:  Diagnosis Date   Arthritis    Carpal tunnel syndrome    Chronic migraine 01/23/2019   Diabetes mellitus    Diastolic heart failure (HCC)    DVT (deep venous thrombosis) (HCC)    left calf   GERD (gastroesophageal reflux disease)    Hypertension    Obesity    Pulmonary embolism (HCC) 2013   He has a history of HTN.  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 A repeat scan done in 2017 did not demonstrate evidence of emboli. Neither  study demonstrated any coronary calcifications.    Pulmonary embolism (HCC)    Shoulder pain, bilateral    Sleep apnea    was fitted for mask and never got machine - too costly    Family History  Problem Relation Age of Onset   Hypertension Mother    Kidney disease Father    Hypertension Other    Hyperlipidemia Other    Diabetes Other     Past Surgical History:  Procedure Laterality Date   CARPAL TUNNEL RELEASE Right 12/04/2015   Procedure: RIGHT CARPAL TUNNEL RELEASE;  Surgeon: Betha Loa, MD;  Location: [facility];  Service: Orthopedics;  Laterality: Right;  RIGHT CARPAL TUNNEL RELEASE   CARPAL TUNNEL RELEASE Right 12/2015   [facility]   CARPAL TUNNEL RELEASE Left 04/01/2016   Procedure: LEFT CARPAL TUNNEL RELEASE;  Surgeon: Betha Loa, MD;  Location: [facility];  Service: Orthopedics;  Laterality: Left;   I & D EXTREMITY Left 02/19/2021   Procedure: LEFT FOOT SPUR REMOVAL;  Surgeon: Cammy Copa, MD;   Location: [facility] OR;  Service: Orthopedics;  Laterality: Left;   KNEE ARTHROSCOPY WITH MEDIAL MENISECTOMY Left 02/19/2021   Procedure: LEFT KNEE ARTHROSCOPY, MENISCAL CYST DECOMPRESSION;  Surgeon: Cammy Copa, MD;  Location: [facility] OR;  Service: Orthopedics;  Laterality: Left;   right hand surgery     ROTATOR CUFF REPAIR Right    Social History   Occupational History   Occupation: real estate agent  Tobacco Use   Smoking status: Never   Smokeless tobacco: Never  Vaping Use   Vaping status: Never Used  Substance and Sexual Activity   Alcohol use: No    Alcohol/week: 0.0 standard drinks of alcohol   Drug use: No   Sexual activity: Yes

## 2023-07-02 MED ORDER — METHYLPREDNISOLONE ACETATE 40 MG/ML IJ SUSP
13.3300 mg | INTRAMUSCULAR | Status: AC | PRN
  Administered 2023-06-29: 13.33 mg

## 2023-07-02 MED ORDER — LIDOCAINE HCL 1 % IJ SOLN
3.0000 mL | INTRAMUSCULAR | Status: AC | PRN
  Administered 2023-06-29: 3 mL

## 2023-07-02 MED ORDER — BUPIVACAINE HCL 0.5 % IJ SOLN
0.6600 mL | INTRAMUSCULAR | Status: AC | PRN
  Administered 2023-06-29: .66 mL

## 2023-07-06 ENCOUNTER — Other Ambulatory Visit: Payer: Self-pay

## 2023-07-06 ENCOUNTER — Telehealth: Payer: Self-pay | Admitting: Physical Medicine and Rehabilitation

## 2023-07-06 ENCOUNTER — Ambulatory Visit (INDEPENDENT_AMBULATORY_CARE_PROVIDER_SITE_OTHER): Payer: 59 | Admitting: Orthopedic Surgery

## 2023-07-06 DIAGNOSIS — M47816 Spondylosis without myelopathy or radiculopathy, lumbar region: Secondary | ICD-10-CM

## 2023-07-06 DIAGNOSIS — M19012 Primary osteoarthritis, left shoulder: Secondary | ICD-10-CM

## 2023-07-06 DIAGNOSIS — M25512 Pain in left shoulder: Secondary | ICD-10-CM

## 2023-07-06 NOTE — Telephone Encounter (Signed)
 Patient would like an appointment with Dr. Alvester Morin for ablation.

## 2023-07-07 ENCOUNTER — Encounter: Payer: Self-pay | Admitting: Orthopedic Surgery

## 2023-07-07 NOTE — Progress Notes (Signed)
   Procedure Note  [name]             Date of Birth: [birth year]           MRN: [id number]             Visit Date: 07/06/2023  Procedures: Visit Diagnoses:  1. Left shoulder pain, unspecified chronicity     Medium Joint Inj: L acromioclavicular on 07/06/2023 5:05 PM Indications: diagnostic evaluation and pain Details: 25 G 1.5 in needle, ultrasound-guided superior approach Medications: 3 mL lidocaine 1 %; 0.66 mL bupivacaine 0.25 %; 13.33 mg methylPREDNISolone acetate 40 MG/ML Outcome: tolerated well, no immediate complications Procedure, treatment alternatives, risks and benefits explained, specific risks discussed. Consent was given by the patient. Immediately prior to procedure a time out was called to verify the correct patient, procedure, equipment, support staff and site/side marked as required. Patient was prepped and draped in the usual sterile fashion.

## 2023-07-10 MED ORDER — METHYLPREDNISOLONE ACETATE 40 MG/ML IJ SUSP
13.3300 mg | INTRAMUSCULAR | Status: AC | PRN
  Administered 2023-07-06: 13.33 mg via INTRA_ARTICULAR

## 2023-07-10 MED ORDER — LIDOCAINE HCL 1 % IJ SOLN
3.0000 mL | INTRAMUSCULAR | Status: AC | PRN
  Administered 2023-07-06: 3 mL

## 2023-07-10 MED ORDER — BUPIVACAINE HCL 0.25 % IJ SOLN
0.6600 mL | INTRAMUSCULAR | Status: AC | PRN
  Administered 2023-07-06: .66 mL via INTRA_ARTICULAR

## 2023-07-13 ENCOUNTER — Other Ambulatory Visit: Payer: Self-pay

## 2023-07-13 ENCOUNTER — Ambulatory Visit (INDEPENDENT_AMBULATORY_CARE_PROVIDER_SITE_OTHER): Admitting: Family Medicine

## 2023-07-13 ENCOUNTER — Encounter: Payer: Self-pay | Admitting: Family Medicine

## 2023-07-13 VITALS — BP 126/84 | Ht 66.0 in | Wt 230.0 lb

## 2023-07-13 DIAGNOSIS — M654 Radial styloid tenosynovitis [de Quervain]: Secondary | ICD-10-CM

## 2023-07-13 MED ORDER — METHYLPREDNISOLONE ACETATE 40 MG/ML IJ SUSP
20.0000 mg | Freq: Once | INTRAMUSCULAR | Status: AC
  Administered 2023-07-13: 20 mg via INTRA_ARTICULAR

## 2023-07-13 NOTE — Progress Notes (Signed)
 PCP: Georgina Quint, MD  Subjective:   HPI: Patient is a 53 y.o. male here for left wrist pain.  Patient reports he's had about 2 weeks of radial sided wrist pain. No acute injury or trauma. Has been catching and locking with thumb motions. No numbness or tingling. Tried nsaids without benefit.  Past Medical History:  Diagnosis Date   Arthritis    Carpal tunnel syndrome    Chronic migraine 01/23/2019   Diabetes mellitus    Diastolic heart failure (HCC)    DVT (deep venous thrombosis) (HCC)    left calf   GERD (gastroesophageal reflux disease)    Hypertension    Obesity    Pulmonary embolism (HCC) 2013   He has a history of HTN.  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 A repeat scan done in 2017 did not demonstrate evidence of emboli. Neither study demonstrated any coronary calcifications.    Pulmonary embolism (HCC)    Shoulder pain, bilateral    Sleep apnea    was fitted for mask and never got machine - too costly    Current Outpatient Medications on File Prior to Visit  Medication Sig Dispense Refill   Aloe Vera OIL by Does not apply route. Blue stop- Emu oil, Aloe oil, Coconut oil     amLODipine (NORVASC) 10 MG tablet Take 1 tablet (10 mg total) by mouth daily. 90 tablet 3   aspirin 81 MG chewable tablet Chew by mouth daily.     atorvastatin (LIPITOR) 20 MG tablet Take 1 tablet (20 mg total) by mouth daily. 90 tablet 3   fluticasone (FLONASE) 50 MCG/ACT nasal spray Place 2 sprays into both nostrils daily. (Patient taking differently: Place 2 sprays into both nostrils daily as needed for allergies.) 16 mL 0   glucose blood test strip Use as instructed. Inject into the skin once daily. E11.65 200 each 12   HYDROcodone-acetaminophen (NORCO/VICODIN) 5-325 MG tablet Take 1 tablet by mouth every 8 (eight) hours as needed for moderate pain (pain score 4-6). 20 tablet 0   JARDIANCE 25 MG TABS tablet Take 1 tablet (25 mg total) by mouth  daily. 90 tablet 3   lisinopril (ZESTRIL) 20 MG tablet Take 1 tablet (20 mg total) by mouth daily. 90 tablet 3   loratadine (CLARITIN) 10 MG tablet Take 10 mg by mouth daily as needed for allergies.     metFORMIN (GLUCOPHAGE) 500 MG tablet TAKE 1 TABLET BY MOUTH TWICE A DAY WITH A MEAL 180 tablet 3   methocarbamol (ROBAXIN) 500 MG tablet Take 1 tablet (500 mg total) by mouth every 8 (eight) hours as needed for muscle spasms. 30 tablet 1   Semaglutide, 2 MG/DOSE, 8 MG/3ML SOPN Inject 2 mg into the skin once a week. 3 mL 5   No current facility-administered medications on file prior to visit.    Past Surgical History:  Procedure Laterality Date   CARPAL TUNNEL RELEASE Right 12/04/2015   Procedure: RIGHT CARPAL TUNNEL RELEASE;  Surgeon: Betha Loa, MD;  Location: [facility];  Service: Orthopedics;  Laterality: Right;  RIGHT CARPAL TUNNEL RELEASE   CARPAL TUNNEL RELEASE Right 12/2015   [facility]   CARPAL TUNNEL RELEASE Left 04/01/2016   Procedure: LEFT CARPAL TUNNEL RELEASE;  Surgeon: Betha Loa, MD;  Location: [facility];  Service: Orthopedics;  Laterality: Left;   I & D EXTREMITY Left 02/19/2021   Procedure: LEFT FOOT SPUR REMOVAL;  Surgeon: Rise Paganini  Lorin Picket, MD;  Location: [facility] OR;  Service: Orthopedics;  Laterality: Left;   KNEE ARTHROSCOPY WITH MEDIAL MENISECTOMY Left 02/19/2021   Procedure: LEFT KNEE ARTHROSCOPY, MENISCAL CYST DECOMPRESSION;  Surgeon: Cammy Copa, MD;  Location: [facility] OR;  Service: Orthopedics;  Laterality: Left;   right hand surgery     ROTATOR CUFF REPAIR Right     Allergies  Allergen Reactions   Pollen Extract Other (See Comments)    BP 126/84   Ht 5\' 6"  (1.676 m)   Wt 230 lb (104.3 kg)   BMI 37.12 kg/m      01/02/2020    2:21 PM  [facility] Adult Exercise  Frequency of aerobic exercise (# of days/week) 5  Average time in minutes 45  Frequency of strengthening activities (# of days/week) 0        No data  to display              Objective:  Physical Exam:  Gen: NAD, comfortable in exam room  Left wrist: No deformity, swelling, bruising. FROM with 5/5 strength. Tenderness to palpation 1st dorsal compartment.  No 1st CMC, carpal tunnel, other tenderness. NVI distally. Minimally positive finkelsteins.   Assessment & Plan:  1. Left wrist pain - 2/2 de quervain's tenosynovitis.  Discussed options - would like injection.  Discussed risks and benefits including hypopigmentation, numbness, bleeding.  Nsaids, icing.  Consider thumb spica, repeat injection in future.  After informed written consent patient timeout was performed. Patient was seated on table in exam room.  Area overlying left 1st dorsal compartment of the wrist prepped with alcohol swabs then utilizing ultrasound guidance patient's left 1st dorsal compartment was injected with 0.5:0.50mL lidocaine: depomedrol.  Patient tolerated procedure well without immediate complications.

## 2023-07-18 ENCOUNTER — Ambulatory Visit (INDEPENDENT_AMBULATORY_CARE_PROVIDER_SITE_OTHER): Admitting: Orthopedic Surgery

## 2023-07-18 DIAGNOSIS — M79672 Pain in left foot: Secondary | ICD-10-CM

## 2023-07-19 ENCOUNTER — Encounter: Payer: Self-pay | Admitting: Orthopedic Surgery

## 2023-07-19 NOTE — Progress Notes (Signed)
 Office Visit Note   [name]           Date of Birth: [birth year]           MRN: [id number] Visit Date: 07/18/2023              Requested by: Georgina Quint, MD [address] [place],  [place] [address] PCP: Georgina Quint, MD  Chief Complaint  Patient presents with   Left Foot - Pain      HPI: Patient is a 53 year old gentleman who is seen for initial evaluation for prominent callus posterior aspect of the left heel.  Assessment & Plan: Visit Diagnoses:  1. Pain of left heel     Plan: Callus was pared.  Recommended using a Dr. Margart Sickles donut over the area of the callus.  Recommended a softer heel counter athletic shoe.  Follow-Up Instructions: Return if symptoms worsen or fail to improve.   Ortho Exam  Patient is alert, oriented, no adenopathy, well-dressed, normal affect, normal respiratory effort.  Patient is wearing hard leather dress shoes. Examination of the left foot patient has a good dorsalis pedis pulse he has good ankle good subtalar motion.  Review of the CT scan shows a plantar heel spur with Haglund deformity and small calcification of the insertion of the Achilles.  Patient does have a callus over the posterior aspect of the heel.  After informed consent the callus was pared with a 10 blade knife.  No abscess.  Imaging: No results found. No images are attached to the encounter.  Labs: Lab Results  Component Value Date   HGBA1C 7.3 (A) 06/02/2023   HGBA1C 7.9 (H) 02/24/2023   HGBA1C 7.2 (A) 09/13/2022     Lab Results  Component Value Date   ALBUMIN 4.7 02/24/2023   ALBUMIN 4.4 09/13/2022   ALBUMIN 4.6 01/05/2022    No results found for: "MG" No results found for: "VD25OH"  No results found for: "PREALBUMIN"    Latest Ref Rng & Units 03/10/2023    3:36 PM 02/24/2023    3:04 PM 09/13/2022    3:48 PM  CBC EXTENDED  WBC 4.0 - 10.5 K/uL 4.9  4.7  5.8   RBC 4.22 - 5.81 MIL/uL 6.93  6.61  6.41   Hemoglobin  13.0 - 17.0 g/dL 74.2  59.5  63.8   HCT 39.0 - 52.0 % 55.8  52.7  50.8   Platelets 150 - 400 K/uL 250  280.0  245.0   NEUT# 1.4 - 7.7 K/uL  1.8  2.7   Lymph# 0.7 - 4.0 K/uL  2.4  2.3      There is no height or weight on file to calculate BMI.  Orders:  No orders of the defined types were placed in this encounter.  No orders of the defined types were placed in this encounter.    Procedures: No procedures performed  Clinical Data: No additional findings.  ROS:  All other systems negative, except as noted in the HPI. Review of Systems  Objective: Vital Signs: There were no vitals taken for this visit.  Specialty Comments:  MRI LUMBAR SPINE WITHOUT CONTRAST     TECHNIQUE:  Multiplanar, multisequence MR imaging of the lumbar spine was  performed. No intravenous contrast was administered.     COMPARISON:  Prior radiograph from 01/16/2020 and MRI from  02/05/2017.     FINDINGS:  Segmentation:  Standard.     Alignment:  Physiologic.  No listhesis.  Vertebrae: Vertebral body height maintained without acute or chronic  fracture. Bone marrow signal intensity within normal limits. No  discrete or worrisome osseous lesions. Mild reactive marrow edema  noted about the right greater than left L4-5 facets due to facet  arthritis. No other abnormal marrow edema.     Conus medullaris and cauda equina: Conus extends to the L1 level.  Conus and cauda equina appear normal.     Paraspinal and other soft tissues: Unremarkable.     Disc levels:     L1-2:  Unremarkable.     L2-3:  Unremarkable.     L3-4: Normal interspace. Minimal facet spurring. No canal or  foraminal stenosis. No impingement.     L4-5: Disc desiccation. Shallow broad-based left subarticular disc  protrusion indents the left ventral thecal sac, encroaching upon the  left lateral recess. Associated central annular fissure. Moderate  bilateral facet hypertrophy, mildly progressed from previous.   Resultant mild canal with moderate left lateral recess stenosis,  descending L5 nerve root level. Mild to moderate bilateral L4  foraminal narrowing. Appearance is overall relatively similar to  previous.     L5-S1: Normal interspace. Moderate right with mild left facet  hypertrophy, mildly progressed from previous. No stenosis or  impingement.     IMPRESSION:  1. Shallow left subarticular disc protrusion at L4-5 with resultant  mild canal and moderate left lateral recess stenosis, potentially  affecting the descending left L5 nerve root.  2. Moderate bilateral facet hypertrophy at L4-5 and L5-S1, mildly  progressed from previous, and could contribute to lower back pain.        Electronically Signed    By: Rise Mu M.D.    On: 01/27/2020 01:46  PMFS History: Patient Active Problem List   Diagnosis Date Noted   Arthritis of left acromioclavicular joint 03/27/2023   Osteoarthritis of right knee 03/16/2023   GERD (gastroesophageal reflux disease) 03/31/2021   History of colonic polyps 11/21/2020   Chronic left shoulder pain 11/20/2020   Hyperlipidemia 04/23/2020   Class 2 severe obesity due to excess calories with serious comorbidity and body mass index (BMI) of 37.0 to 37.9 in adult [facility]) 02/13/2019   Sleep apnea 01/23/2019   Chronic diastolic heart failure (HCC) 11/07/2016   OSA (obstructive sleep apnea) 04/18/2012   Left ventricular diastolic dysfunction, NYHA class 1/moderate LVH with concentric hypertrophy 09/03/2011   Dyslipidemia associated with type 2 diabetes mellitus (HCC) 09/02/2011   Hypertension associated with diabetes (HCC) 09/02/2011   Past Medical History:  Diagnosis Date   Arthritis    Carpal tunnel syndrome    Chronic migraine 01/23/2019   Diabetes mellitus    Diastolic heart failure (HCC)    DVT (deep venous thrombosis) (HCC)    left calf   GERD (gastroesophageal reflux disease)    Hypertension    Obesity    Pulmonary embolism (HCC)  2013   He has a history of HTN.  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 A repeat scan done in 2017 did not demonstrate evidence of emboli. Neither study demonstrated any coronary calcifications.    Pulmonary embolism (HCC)    Shoulder pain, bilateral    Sleep apnea    was fitted for mask and never got machine - too costly    Family History  Problem Relation Age of Onset   Hypertension Mother    Kidney disease Father    Hypertension Other    Hyperlipidemia Other    Diabetes  Other     Past Surgical History:  Procedure Laterality Date   CARPAL TUNNEL RELEASE Right 12/04/2015   Procedure: RIGHT CARPAL TUNNEL RELEASE;  Surgeon: Betha Loa, MD;  Location: [facility];  Service: Orthopedics;  Laterality: Right;  RIGHT CARPAL TUNNEL RELEASE   CARPAL TUNNEL RELEASE Right 12/2015   [facility]   CARPAL TUNNEL RELEASE Left 04/01/2016   Procedure: LEFT CARPAL TUNNEL RELEASE;  Surgeon: Betha Loa, MD;  Location: [facility];  Service: Orthopedics;  Laterality: Left;   I & D EXTREMITY Left 02/19/2021   Procedure: LEFT FOOT SPUR REMOVAL;  Surgeon: Cammy Copa, MD;  Location: [facility];  Service: Orthopedics;  Laterality: Left;   KNEE ARTHROSCOPY WITH MEDIAL MENISECTOMY Left 02/19/2021   Procedure: LEFT KNEE ARTHROSCOPY, MENISCAL CYST DECOMPRESSION;  Surgeon: Cammy Copa, MD;  Location: [facility] OR;  Service: Orthopedics;  Laterality: Left;   right hand surgery     ROTATOR CUFF REPAIR Right    Social History   Occupational History   Occupation: real estate agent  Tobacco Use   Smoking status: Never   Smokeless tobacco: Never  Vaping Use   Vaping status: Never Used  Substance and Sexual Activity   Alcohol use: No    Alcohol/week: 0.0 standard drinks of alcohol   Drug use: No   Sexual activity: Yes

## 2023-07-27 ENCOUNTER — Ambulatory Visit (INDEPENDENT_AMBULATORY_CARE_PROVIDER_SITE_OTHER): Admitting: Physical Medicine and Rehabilitation

## 2023-07-27 ENCOUNTER — Other Ambulatory Visit: Payer: Self-pay

## 2023-07-27 VITALS — BP 133/92 | HR 91

## 2023-07-27 DIAGNOSIS — M47816 Spondylosis without myelopathy or radiculopathy, lumbar region: Secondary | ICD-10-CM | POA: Diagnosis not present

## 2023-07-27 MED ORDER — METHYLPREDNISOLONE ACETATE 40 MG/ML IJ SUSP
40.0000 mg | Freq: Once | INTRAMUSCULAR | Status: AC
  Administered 2023-07-27: 40 mg

## 2023-07-27 NOTE — Progress Notes (Unsigned)
 Pain Scale   Average Pain 8        +Driver, -BT, -Dye Allergies.

## 2023-07-27 NOTE — Patient Instructions (Signed)

## 2023-07-29 NOTE — Procedures (Signed)
 Lumbar Facet Joint Nerve Denervation  [name]      Date of Birth: January 05, 1971 MRN: [id number] PCP: Georgina Quint, MD      Visit Date: 07/27/2023   Universal Protocol:    Date/Time: 03/28/259:10 AM  Consent Given By: the patient  Position: PRONE  Additional Comments: Vital signs were monitored before and after the procedure. Patient was prepped and draped in the usual sterile fashion. The correct patient, procedure, and site was verified.   Injection Procedure Details:   Procedure diagnoses:  1. Spondylosis without myelopathy or radiculopathy, lumbar region      Meds Administered:  Meds ordered this encounter  Medications   methylPREDNISolone acetate (DEPO-MEDROL) injection 40 mg     Laterality: Right  Location/Site:  L4-L5, L3 and L4 medial branches  Needle: 18 ga.,  10mm active tip, RF Cannula  Needle Placement: Along juncture of superior articular process and transverse pocess  Findings:  -Comments:  Procedure Details: For each desired target nerve, the corresponding transverse process (sacral ala for the L5 dorsal rami) was identified and the fluoroscope was positioned to square off the endplates of the corresponding vertebral body to achieve a true AP midline view.  The beam was then obliqued 15 to 20 degrees and caudally tilted 15 to 20 degrees to line up a trajectory along the target nerves. The skin over the target of the junction of superior articulating process and transverse process (sacral ala for the L5 dorsal rami) was infiltrated with 1ml of 1% Lidocaine without Epinephrine.  The 18 gauge 10mm active tip outer cannula was advanced in trajectory view to the target.  This procedure was repeated for each target nerve.  Then, for all levels, the outer cannula placement was fine-tuned and the position was then confirmed with bi-planar imaging.    Test stimulation was done both at sensory and motor levels to ensure there was no  radicular stimulation. The target tissues were then infiltrated with 1 ml of 1% Lidocaine without Epinephrine. Subsequently, a percutaneous neurotomy was carried out for 90 seconds at 80 degrees Celsius.  After the completion of the lesion, 1 ml of injectate was delivered. It was then repeated for each facet joint nerve mentioned above. Appropriate radiographs were obtained to verify the probe placement during the neurotomy.   Additional Comments:  The patient tolerated the procedure well Dressing: 2 x 2 sterile gauze and Band-Aid    Post-procedure details: Patient was observed during the procedure. Post-procedure instructions were reviewed.  Patient left the clinic in stable condition.

## 2023-07-29 NOTE — Progress Notes (Signed)
 Scott [name] - 53 y.o. male MRN [id number]  Date of birth: Feb 06, 1971  Office Visit Note: Visit Date: 07/27/2023 PCP: Georgina Quint, MD Referred by: Georgina Quint, *  Subjective: Chief Complaint  Patient presents with   Lower Back - Pain   HPI:  Scott [name] is a 53 y.o. male who comes in todayfor planned repeat radiofrequency ablation of the Right L4-5  Lumbar facet joints. This would be ablation of the corresponding medial branches and/or dorsal rami.  Patient has had double diagnostic blocks with more than 70% relief.  Subsequent ablation gave them more than 6 months of over 60% relief.  They have had chronic back pain for quite some time, more than 3 months, which has been an ongoing situation with recalcitrant axial back pain.  They have no radicular pain.  Their axial pain is worse with standing and ambulating and on exam today with facet loading.  They have had physical therapy as well as home exercise program.  The imaging noted in the chart below indicated facet pathology. Accordingly they meet all the criteria and qualification for for radiofrequency ablation and we are going to complete this today hopefully for more longer term relief as part of comprehensive management program.   ROS Otherwise per HPI.  Assessment & Plan: Visit Diagnoses:    ICD-10-CM   1. Spondylosis without myelopathy or radiculopathy, lumbar region  M47.816 XR C-ARM NO REPORT    Radiofrequency,Lumbar    methylPREDNISolone acetate (DEPO-MEDROL) injection 40 mg      Plan: No additional findings.   Meds & Orders:  Meds ordered this encounter  Medications   methylPREDNISolone acetate (DEPO-MEDROL) injection 40 mg    Orders Placed This Encounter  Procedures   Radiofrequency,Lumbar   XR C-ARM NO REPORT    Follow-up: Return if symptoms worsen or fail to improve.   Procedures: No procedures performed  Lumbar Facet Joint Nerve Denervation  [name]      Date of Birth: August 26, 1970 MRN: [id number] PCP: Georgina Quint, MD      Visit Date: 07/27/2023   Universal Protocol:    Date/Time: 03/28/259:10 AM  Consent Given By: the patient  Position: PRONE  Additional Comments: Vital signs were monitored before and after the procedure. Patient was prepped and draped in the usual sterile fashion. The correct patient, procedure, and site was verified.   Injection Procedure Details:   Procedure diagnoses:  1. Spondylosis without myelopathy or radiculopathy, lumbar region      Meds Administered:  Meds ordered this encounter  Medications   methylPREDNISolone acetate (DEPO-MEDROL) injection 40 mg     Laterality: Right  Location/Site:  L4-L5, L3 and L4 medial branches  Needle: 18 ga.,  10mm active tip, RF Cannula  Needle Placement: Along juncture of superior articular process and transverse pocess  Findings:  -Comments:  Procedure Details: For each desired target nerve, the corresponding transverse process (sacral ala for the L5 dorsal rami) was identified and the fluoroscope was positioned to square off the endplates of the corresponding vertebral body to achieve a true AP midline view.  The beam was then obliqued 15 to 20 degrees and caudally tilted 15 to 20 degrees to line up a trajectory along the target nerves. The skin over the target of the junction of superior articulating process and transverse process (sacral ala for the L5 dorsal rami) was infiltrated with 1ml of 1% Lidocaine without Epinephrine.  The 18 gauge 10mm active tip  outer cannula was advanced in trajectory view to the target.  This procedure was repeated for each target nerve.  Then, for all levels, the outer cannula placement was fine-tuned and the position was then confirmed with bi-planar imaging.    Test stimulation was done both at sensory and motor levels to ensure there was no radicular stimulation. The target tissues were then  infiltrated with 1 ml of 1% Lidocaine without Epinephrine. Subsequently, a percutaneous neurotomy was carried out for 90 seconds at 80 degrees Celsius.  After the completion of the lesion, 1 ml of injectate was delivered. It was then repeated for each facet joint nerve mentioned above. Appropriate radiographs were obtained to verify the probe placement during the neurotomy.   Additional Comments:  The patient tolerated the procedure well Dressing: 2 x 2 sterile gauze and Band-Aid    Post-procedure details: Patient was observed during the procedure. Post-procedure instructions were reviewed.  Patient left the clinic in stable condition.      Clinical History: MRI LUMBAR SPINE WITHOUT CONTRAST     TECHNIQUE:  Multiplanar, multisequence MR imaging of the lumbar spine was  performed. No intravenous contrast was administered.     COMPARISON:  Prior radiograph from 01/16/2020 and MRI from  02/05/2017.     FINDINGS:  Segmentation:  Standard.     Alignment:  Physiologic.  No listhesis.     Vertebrae: Vertebral body height maintained without acute or chronic  fracture. Bone marrow signal intensity within normal limits. No  discrete or worrisome osseous lesions. Mild reactive marrow edema  noted about the right greater than left L4-5 facets due to facet  arthritis. No other abnormal marrow edema.     Conus medullaris and cauda equina: Conus extends to the L1 level.  Conus and cauda equina appear normal.     Paraspinal and other soft tissues: Unremarkable.     Disc levels:     L1-2:  Unremarkable.     L2-3:  Unremarkable.     L3-4: Normal interspace. Minimal facet spurring. No canal or  foraminal stenosis. No impingement.     L4-5: Disc desiccation. Shallow broad-based left subarticular disc  protrusion indents the left ventral thecal sac, encroaching upon the  left lateral recess. Associated central annular fissure. Moderate  bilateral facet hypertrophy, mildly progressed  from previous.  Resultant mild canal with moderate left lateral recess stenosis,  descending L5 nerve root level. Mild to moderate bilateral L4  foraminal narrowing. Appearance is overall relatively similar to  previous.     L5-S1: Normal interspace. Moderate right with mild left facet  hypertrophy, mildly progressed from previous. No stenosis or  impingement.     IMPRESSION:  1. Shallow left subarticular disc protrusion at L4-5 with resultant  mild canal and moderate left lateral recess stenosis, potentially  affecting the descending left L5 nerve root.  2. Moderate bilateral facet hypertrophy at L4-5 and L5-S1, mildly  progressed from previous, and could contribute to lower back pain.        Electronically Signed    By: Rise Mu M.D.    On: 01/27/2020 01:46     Objective:  VS:  HT:    WT:   BMI:     BP:(!) 133/92  HR:91bpm  TEMP: ( )  RESP:  Physical Exam Vitals and nursing note reviewed.  Constitutional:      General: He is not in acute distress.    Appearance: Normal appearance. He is not ill-appearing.  HENT:  Head: Normocephalic and atraumatic.     Right Ear: External ear normal.     Left Ear: External ear normal.     Nose: No congestion.  Eyes:     Extraocular Movements: Extraocular movements intact.  Cardiovascular:     Rate and Rhythm: Normal rate.     Pulses: Normal pulses.  Pulmonary:     Effort: Pulmonary effort is normal. No respiratory distress.  Abdominal:     General: There is no distension.     Palpations: Abdomen is soft.  Musculoskeletal:        General: No tenderness or signs of injury.     Cervical back: Neck supple.     Right lower leg: No edema.     Left lower leg: No edema.     Comments: Patient has good distal strength without clonus.  Skin:    Findings: No erythema or rash.  Neurological:     General: No focal deficit present.     Mental Status: He is alert and oriented to person, place, and time.     Sensory: No  sensory deficit.     Motor: No weakness or abnormal muscle tone.     Coordination: Coordination normal.  Psychiatric:        Mood and Affect: Mood normal.        Behavior: Behavior normal.      Imaging: No results found.

## 2023-08-08 ENCOUNTER — Ambulatory Visit: Admitting: Physical Medicine and Rehabilitation

## 2023-08-08 ENCOUNTER — Other Ambulatory Visit: Payer: Self-pay

## 2023-08-08 VITALS — BP 115/79 | HR 97

## 2023-08-08 DIAGNOSIS — M47816 Spondylosis without myelopathy or radiculopathy, lumbar region: Secondary | ICD-10-CM

## 2023-08-08 MED ORDER — METHYLPREDNISOLONE ACETATE 40 MG/ML IJ SUSP
40.0000 mg | Freq: Once | INTRAMUSCULAR | Status: AC
  Administered 2023-08-08: 40 mg

## 2023-08-08 NOTE — Procedures (Signed)
 Lumbar Facet Joint Nerve Denervation  [name]      Date of Birth: 1971-01-14 MRN: [id number] PCP: Georgina Quint, MD      Visit Date: 08/08/2023   Universal Protocol:    Date/Time: 04/07/251:19 PM  Consent Given By: the patient  Position: PRONE  Additional Comments: Vital signs were monitored before and after the procedure. Patient was prepped and draped in the usual sterile fashion. The correct patient, procedure, and site was verified.   Injection Procedure Details:   Procedure diagnoses:  1. Spondylosis without myelopathy or radiculopathy, lumbar region      Meds Administered:  Meds ordered this encounter  Medications   methylPREDNISolone acetate (DEPO-MEDROL) injection 40 mg     Laterality: Left  Location/Site:  L4-L5, L3 and L4 medial branches  Needle: 18 ga.,  10mm active tip, RF Cannula  Needle Placement: Along juncture of superior articular process and transverse pocess  Findings:  -Comments:  Procedure Details: For each desired target nerve, the corresponding transverse process (sacral ala for the L5 dorsal rami) was identified and the fluoroscope was positioned to square off the endplates of the corresponding vertebral body to achieve a true AP midline view.  The beam was then obliqued 15 to 20 degrees and caudally tilted 15 to 20 degrees to line up a trajectory along the target nerves. The skin over the target of the junction of superior articulating process and transverse process (sacral ala for the L5 dorsal rami) was infiltrated with 1ml of 1% Lidocaine without Epinephrine.  The 18 gauge 10mm active tip outer cannula was advanced in trajectory view to the target.  This procedure was repeated for each target nerve.  Then, for all levels, the outer cannula placement was fine-tuned and the position was then confirmed with bi-planar imaging.    Test stimulation was done both at sensory and motor levels to ensure there was no  radicular stimulation. The target tissues were then infiltrated with 1 ml of 1% Lidocaine without Epinephrine. Subsequently, a percutaneous neurotomy was carried out for 90 seconds at 80 degrees Celsius.  After the completion of the lesion, 1 ml of injectate was delivered. It was then repeated for each facet joint nerve mentioned above. Appropriate radiographs were obtained to verify the probe placement during the neurotomy.   Additional Comments:  No complications occurred Dressing: 2 x 2 sterile gauze and Band-Aid    Post-procedure details: Patient was observed during the procedure. Post-procedure instructions were reviewed.  Patient left the clinic in stable condition.

## 2023-08-08 NOTE — Progress Notes (Signed)
 Pain Scale   Average Pain 9        +Driver, -BT, -Dye Allergies.

## 2023-08-08 NOTE — Progress Notes (Signed)
 Scott [name] - 53 y.o. male MRN [id number]  Date of birth: [birth year]  Office Visit Note: Visit Date: 08/08/2023 PCP: Georgina Quint, MD Referred by: Georgina Quint, *  Subjective: Chief Complaint  Patient presents with   Lower Back - Pain   HPI:  Scott [name] is a 53 y.o. male who comes in todayfor planned repeat radiofrequency ablation of the Left L4-5  Lumbar facet joints. This would be ablation of the corresponding medial branches and/or dorsal rami.  Patient has had double diagnostic blocks with more than 70% relief.  Subsequent ablation gave them more than 6 months of over 60% relief.  They have had chronic back pain for quite some time, more than 3 months, which has been an ongoing situation with recalcitrant axial back pain.  They have no radicular pain.  Their axial pain is worse with standing and ambulating and on exam today with facet loading.  They have had physical therapy as well as home exercise program.  The imaging noted in the chart below indicated facet pathology. Accordingly they meet all the criteria and qualification for for radiofrequency ablation and we are going to complete this today hopefully for more longer term relief as part of comprehensive management program.    ROS Otherwise per HPI.  Assessment & Plan: Visit Diagnoses:    ICD-10-CM   1. Spondylosis without myelopathy or radiculopathy, lumbar region  M47.816 XR C-ARM NO REPORT    Radiofrequency,Lumbar    methylPREDNISolone acetate (DEPO-MEDROL) injection 40 mg      Plan: No additional findings.   Meds & Orders:  Meds ordered this encounter  Medications   methylPREDNISolone acetate (DEPO-MEDROL) injection 40 mg    Orders Placed This Encounter  Procedures   Radiofrequency,Lumbar   XR C-ARM NO REPORT    Follow-up: Return if symptoms worsen or fail to improve.   Procedures: No procedures performed  Lumbar Facet Joint Nerve Denervation  [name]      Date of Birth: 11-09-1970 MRN: [id number] PCP: Georgina Quint, MD      Visit Date: 08/08/2023   Universal Protocol:    Date/Time: 04/07/251:19 PM  Consent Given By: the patient  Position: PRONE  Additional Comments: Vital signs were monitored before and after the procedure. Patient was prepped and draped in the usual sterile fashion. The correct patient, procedure, and site was verified.   Injection Procedure Details:   Procedure diagnoses:  1. Spondylosis without myelopathy or radiculopathy, lumbar region      Meds Administered:  Meds ordered this encounter  Medications   methylPREDNISolone acetate (DEPO-MEDROL) injection 40 mg     Laterality: Left  Location/Site:  L4-L5, L3 and L4 medial branches  Needle: 18 ga.,  10mm active tip, RF Cannula  Needle Placement: Along juncture of superior articular process and transverse pocess  Findings:  -Comments:  Procedure Details: For each desired target nerve, the corresponding transverse process (sacral ala for the L5 dorsal rami) was identified and the fluoroscope was positioned to square off the endplates of the corresponding vertebral body to achieve a true AP midline view.  The beam was then obliqued 15 to 20 degrees and caudally tilted 15 to 20 degrees to line up a trajectory along the target nerves. The skin over the target of the junction of superior articulating process and transverse process (sacral ala for the L5 dorsal rami) was infiltrated with 1ml of 1% Lidocaine without Epinephrine.  The 18 gauge 10mm active  tip outer cannula was advanced in trajectory view to the target.  This procedure was repeated for each target nerve.  Then, for all levels, the outer cannula placement was fine-tuned and the position was then confirmed with bi-planar imaging.    Test stimulation was done both at sensory and motor levels to ensure there was no radicular stimulation. The target tissues were then  infiltrated with 1 ml of 1% Lidocaine without Epinephrine. Subsequently, a percutaneous neurotomy was carried out for 90 seconds at 80 degrees Celsius.  After the completion of the lesion, 1 ml of injectate was delivered. It was then repeated for each facet joint nerve mentioned above. Appropriate radiographs were obtained to verify the probe placement during the neurotomy.   Additional Comments:  No complications occurred Dressing: 2 x 2 sterile gauze and Band-Aid    Post-procedure details: Patient was observed during the procedure. Post-procedure instructions were reviewed.  Patient left the clinic in stable condition.      Clinical History: MRI LUMBAR SPINE WITHOUT CONTRAST     TECHNIQUE:  Multiplanar, multisequence MR imaging of the lumbar spine was  performed. No intravenous contrast was administered.     COMPARISON:  Prior radiograph from 01/16/2020 and MRI from  02/05/2017.     FINDINGS:  Segmentation:  Standard.     Alignment:  Physiologic.  No listhesis.     Vertebrae: Vertebral body height maintained without acute or chronic  fracture. Bone marrow signal intensity within normal limits. No  discrete or worrisome osseous lesions. Mild reactive marrow edema  noted about the right greater than left L4-5 facets due to facet  arthritis. No other abnormal marrow edema.     Conus medullaris and cauda equina: Conus extends to the L1 level.  Conus and cauda equina appear normal.     Paraspinal and other soft tissues: Unremarkable.     Disc levels:     L1-2:  Unremarkable.     L2-3:  Unremarkable.     L3-4: Normal interspace. Minimal facet spurring. No canal or  foraminal stenosis. No impingement.     L4-5: Disc desiccation. Shallow broad-based left subarticular disc  protrusion indents the left ventral thecal sac, encroaching upon the  left lateral recess. Associated central annular fissure. Moderate  bilateral facet hypertrophy, mildly progressed from previous.   Resultant mild canal with moderate left lateral recess stenosis,  descending L5 nerve root level. Mild to moderate bilateral L4  foraminal narrowing. Appearance is overall relatively similar to  previous.     L5-S1: Normal interspace. Moderate right with mild left facet  hypertrophy, mildly progressed from previous. No stenosis or  impingement.     IMPRESSION:  1. Shallow left subarticular disc protrusion at L4-5 with resultant  mild canal and moderate left lateral recess stenosis, potentially  affecting the descending left L5 nerve root.  2. Moderate bilateral facet hypertrophy at L4-5 and L5-S1, mildly  progressed from previous, and could contribute to lower back pain.        Electronically Signed    By: Rise Mu M.D.    On: 01/27/2020 01:46     Objective:  VS:  HT:    WT:   BMI:     BP:115/79  HR:97bpm  TEMP: ( )  RESP:  Physical Exam Vitals and nursing note reviewed.  Constitutional:      General: He is not in acute distress.    Appearance: Normal appearance. He is not ill-appearing.  HENT:     Head: Normocephalic  and atraumatic.     Right Ear: External ear normal.     Left Ear: External ear normal.     Nose: No congestion.  Eyes:     Extraocular Movements: Extraocular movements intact.  Cardiovascular:     Rate and Rhythm: Normal rate.     Pulses: Normal pulses.  Pulmonary:     Effort: Pulmonary effort is normal. No respiratory distress.  Abdominal:     General: There is no distension.     Palpations: Abdomen is soft.  Musculoskeletal:        General: No tenderness or signs of injury.     Cervical back: Neck supple.     Right lower leg: No edema.     Left lower leg: No edema.     Comments: Patient has good distal strength without clonus.  Skin:    Findings: No erythema or rash.  Neurological:     General: No focal deficit present.     Mental Status: He is alert and oriented to person, place, and time.     Sensory: No sensory deficit.      Motor: No weakness or abnormal muscle tone.     Coordination: Coordination normal.  Psychiatric:        Mood and Affect: Mood normal.        Behavior: Behavior normal.      Imaging: No results found.

## 2023-08-08 NOTE — Patient Instructions (Signed)

## 2023-08-31 ENCOUNTER — Encounter: Payer: Self-pay | Admitting: Emergency Medicine

## 2023-08-31 ENCOUNTER — Telehealth: Payer: Self-pay | Admitting: Orthopedic Surgery

## 2023-08-31 ENCOUNTER — Ambulatory Visit (INDEPENDENT_AMBULATORY_CARE_PROVIDER_SITE_OTHER): Payer: 59 | Admitting: Emergency Medicine

## 2023-08-31 VITALS — BP 122/84 | HR 100 | Temp 98.4°F | Ht 66.0 in | Wt 230.0 lb

## 2023-08-31 DIAGNOSIS — E1159 Type 2 diabetes mellitus with other circulatory complications: Secondary | ICD-10-CM

## 2023-08-31 DIAGNOSIS — I5032 Chronic diastolic (congestive) heart failure: Secondary | ICD-10-CM | POA: Diagnosis not present

## 2023-08-31 DIAGNOSIS — E785 Hyperlipidemia, unspecified: Secondary | ICD-10-CM

## 2023-08-31 DIAGNOSIS — E1169 Type 2 diabetes mellitus with other specified complication: Secondary | ICD-10-CM | POA: Diagnosis not present

## 2023-08-31 DIAGNOSIS — M654 Radial styloid tenosynovitis [de Quervain]: Secondary | ICD-10-CM | POA: Insufficient documentation

## 2023-08-31 DIAGNOSIS — Z7985 Long-term (current) use of injectable non-insulin antidiabetic drugs: Secondary | ICD-10-CM

## 2023-08-31 DIAGNOSIS — Z6837 Body mass index (BMI) 37.0-37.9, adult: Secondary | ICD-10-CM

## 2023-08-31 DIAGNOSIS — E66812 Obesity, class 2: Secondary | ICD-10-CM

## 2023-08-31 DIAGNOSIS — Z7984 Long term (current) use of oral hypoglycemic drugs: Secondary | ICD-10-CM

## 2023-08-31 DIAGNOSIS — I152 Hypertension secondary to endocrine disorders: Secondary | ICD-10-CM

## 2023-08-31 LAB — POCT GLYCOSYLATED HEMOGLOBIN (HGB A1C): Hemoglobin A1C: 6.9 % — AB (ref 4.0–5.6)

## 2023-08-31 NOTE — Assessment & Plan Note (Signed)
 Well-controlled hypertension Continue amlodipine  10 mg and lisinopril  20 mg daily Hemoglobin A1c at 6.9 Recommend to continue metformin  500 mg twice a day along with weekly Ozempic  2 mg and daily Jardiance  25 mg Cardiovascular risks associated with uncontrolled diabetes discussed Diet and nutrition discussed Benefits of exercise discussed Follow-up in 6 months

## 2023-08-31 NOTE — Assessment & Plan Note (Signed)
No signs of volume overload or congestive heart failure.

## 2023-08-31 NOTE — Progress Notes (Signed)
 Scott [name] 53 y.o.   Chief Complaint  Patient presents with   Follow-up    3 month HTN/ DM. Patient mentions he's been having issues with his left wrist would like a referral     HISTORY OF PRESENT ILLNESS: This is a 53 y.o. male here for 61-month follow-up of diabetes and hypertension Also complaining of pain to left wrist area.  Needs referral. Wt Readings from Last 3 Encounters:  08/31/23 230 lb (104.3 kg)  07/13/23 230 lb (104.3 kg)  06/02/23 232 lb 3.2 oz (105.3 kg)   Lab Results  Component Value Date   HGBA1C 7.3 (A) 06/02/2023     HPI   Prior to Admission medications   Medication Sig Start Date End Date Taking? Authorizing Provider  Aloe Vera OIL by Does not apply route. Blue stop- Emu oil, Aloe oil, Coconut oil   Yes [provider]  amLODipine  (NORVASC ) 10 MG tablet Take 1 tablet (10 mg total) by mouth daily. 03/02/23  Yes SagardiaIsidro Margo, MD  aspirin  81 MG chewable tablet Chew by mouth daily.   Yes [provider]  atorvastatin  (LIPITOR) 20 MG tablet Take 1 tablet (20 mg total) by mouth daily. 09/13/22 09/08/23 Yes Amaan Meyer, Isidro Margo, MD  fluticasone  (FLONASE ) 50 MCG/ACT nasal spray Place 2 sprays into both nostrils daily. Patient taking differently: Place 2 sprays into both nostrils daily as needed for allergies. 10/08/20  Yes CovingtonIsa Manuel M, PA-C  glucose blood test strip Use as instructed. Inject into the skin once daily. E11.65 11/05/20  Yes Mayers, Cari S, PA-C  HYDROcodone -acetaminophen  (NORCO/VICODIN) 5-325 MG tablet Take 1 tablet by mouth every 8 (eight) hours as needed for moderate pain (pain score 4-6). 04/14/23 04/13/24 Yes Jasmine Mesi, MD  JARDIANCE  25 MG TABS tablet Take 1 tablet (25 mg total) by mouth daily. 03/02/23  Yes Makenzy Krist, Isidro Margo, MD  lisinopril  (ZESTRIL ) 20 MG tablet Take 1 tablet (20 mg total) by mouth daily. 03/02/23 02/25/24 Yes Shonette Rhames, Isidro Margo, MD  loratadine  (CLARITIN ) 10 MG tablet Take 10  mg by mouth daily as needed for allergies.   Yes [provider]  metFORMIN  (GLUCOPHAGE ) 500 MG tablet TAKE 1 TABLET BY MOUTH TWICE A DAY WITH A MEAL 02/13/23  Yes Sophiagrace Benbrook, Isidro Margo, MD  methocarbamol  (ROBAXIN ) 500 MG tablet Take 1 tablet (500 mg total) by mouth every 8 (eight) hours as needed for muscle spasms. 03/17/23  Yes Magnant, Charles L, PA-C  Semaglutide , 2 MG/DOSE, 8 MG/3ML SOPN Inject 2 mg into the skin once a week. 03/02/23  Yes Elvira Hammersmith, MD    Allergies  Allergen Reactions   Pollen Extract Other (See Comments)    Patient Active Problem List   Diagnosis Date Noted   Arthritis of left acromioclavicular joint 03/27/2023   Osteoarthritis of right knee 03/16/2023   GERD (gastroesophageal reflux disease) 03/31/2021   History of colonic polyps 11/21/2020   Chronic left shoulder pain 11/20/2020   Hyperlipidemia 04/23/2020   Class 2 severe obesity due to excess calories with serious comorbidity and body mass index (BMI) of 37.0 to 37.9 in adult (HCC) 02/13/2019   Sleep apnea 01/23/2019   Chronic diastolic heart failure (HCC) 11/07/2016   OSA (obstructive sleep apnea) 04/18/2012   Left ventricular diastolic dysfunction, NYHA class 1/moderate LVH with concentric hypertrophy 09/03/2011   Dyslipidemia associated with type 2 diabetes mellitus (HCC) 09/02/2011   Hypertension associated with diabetes (HCC) 09/02/2011    Past Medical History:  Diagnosis Date  Arthritis    Carpal tunnel syndrome    Chronic migraine 01/23/2019   Diabetes mellitus    Diastolic heart failure (HCC)    DVT (deep venous thrombosis) (HCC)    left calf   GERD (gastroesophageal reflux disease)    Hypertension    Obesity    Pulmonary embolism (HCC) 2013   He has a history of HTN.  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 A repeat scan done in 2017 did not demonstrate evidence of emboli. Neither study demonstrated any coronary calcifications.     Pulmonary embolism (HCC)    Shoulder pain, bilateral    Sleep apnea    was fitted for mask and never got machine - too costly    Past Surgical History:  Procedure Laterality Date   CARPAL TUNNEL RELEASE Right 12/04/2015   Procedure: RIGHT CARPAL TUNNEL RELEASE;  Surgeon: Brunilda Capra, MD;  Location: [facility];  Service: Orthopedics;  Laterality: Right;  RIGHT CARPAL TUNNEL RELEASE   CARPAL TUNNEL RELEASE Right 12/2015   [facility]   CARPAL TUNNEL RELEASE Left 04/01/2016   Procedure: LEFT CARPAL TUNNEL RELEASE;  Surgeon: Brunilda Capra, MD;  Location: [facility];  Service: Orthopedics;  Laterality: Left;   I & D EXTREMITY Left 02/19/2021   Procedure: LEFT FOOT SPUR REMOVAL;  Surgeon: Jasmine Mesi, MD;  Location: [facility];  Service: Orthopedics;  Laterality: Left;   KNEE ARTHROSCOPY WITH MEDIAL MENISECTOMY Left 02/19/2021   Procedure: LEFT KNEE ARTHROSCOPY, MENISCAL CYST DECOMPRESSION;  Surgeon: Jasmine Mesi, MD;  [facility];  Service: Orthopedics;  Laterality: Left;   right hand surgery     ROTATOR CUFF REPAIR Right     Social History   Socioeconomic History   Marital status: Married    Spouse name: Not on file   Number of children: Not on file   Years of education: 12+   Highest education level: Not on file  Occupational History   Occupation: real estate agent  Tobacco Use   Smoking status: Never   Smokeless tobacco: Never  Vaping Use   Vaping status: Never Used  Substance and Sexual Activity   Alcohol use: No    Alcohol/week: 0.0 standard drinks of alcohol   Drug use: No   Sexual activity: Yes  Other Topics Concern   Not on file  Social History Narrative   Lives at home.   Right-handed.   Occasional caffeine use.   Tea every other day   Social Drivers of Corporate investment banker Strain: Not on file  Food Insecurity: Not on file  Transportation Needs: Not on file  Physical Activity: Not on file  Stress: Not on file   Social Connections: Not on file  Intimate Partner Violence: Not on file    Family History  Problem Relation Age of Onset   Hypertension Mother    Kidney disease Father    Hypertension Other    Hyperlipidemia Other    Diabetes Other      Review of Systems  Constitutional: Negative.  Negative for chills and fever.  HENT: Negative.  Negative for congestion and sore throat.   Respiratory: Negative.  Negative for cough and shortness of breath.   Cardiovascular: Negative.  Negative for chest pain and palpitations.  Gastrointestinal:  Negative for abdominal pain, diarrhea, nausea and vomiting.  Genitourinary: Negative.  Negative for dysuria.  Musculoskeletal:  Positive for joint pain.  Skin: Negative.  Negative for rash.  Neurological: Negative.  Negative for dizziness and headaches.  All other systems reviewed and are negative.   Vitals:   08/31/23 1409  BP: 122/84  Pulse: 100  Temp: 98.4 F (36.9 C)  SpO2: 94%    Physical Exam Vitals reviewed.  Constitutional:      Appearance: Normal appearance.  HENT:     Head: Normocephalic.  Eyes:     Extraocular Movements: Extraocular movements intact.     Pupils: Pupils are equal, round, and reactive to light.  Cardiovascular:     Rate and Rhythm: Normal rate and regular rhythm.     Pulses: Normal pulses.     Heart sounds: Normal heart sounds.  Pulmonary:     Effort: Pulmonary effort is normal.     Breath sounds: Normal breath sounds.  Abdominal:     Palpations: Abdomen is soft.     Tenderness: There is no abdominal tenderness.  Musculoskeletal:     Cervical back: No tenderness.     Comments: Left wrist/hand: Positive tenderness over thumb extensor when closing fingers over thumb  Lymphadenopathy:     Cervical: No cervical adenopathy.  Skin:    General: Skin is warm and dry.     Capillary Refill: Capillary refill takes less than 2 seconds.  Neurological:     General: No focal deficit present.     Mental Status: He is  alert and oriented to person, place, and time.  Psychiatric:        Mood and Affect: Mood normal.        Behavior: Behavior normal.     Results for orders placed or performed in visit on 08/31/23 (from the past 24 hours)  POCT HgB A1C     Status: Abnormal   Collection Time: 08/31/23  2:27 PM  Result Value Ref Range   Hemoglobin A1C 6.9 (A) 4.0 - 5.6 %   HbA1c POC (<> result, manual entry)     HbA1c, POC (prediabetic range)     HbA1c, POC (controlled diabetic range)      ASSESSMENT & PLAN: A total of 42 minutes was spent with the patient and counseling/coordination of care regarding preparing for this visit, review of most recent office visit notes, review of multiple chronic medical conditions and their management, cardiovascular risks associated with diabetes and hypertension, review of all medications, review of most recent bloodwork results including interpretation of today's hemoglobin A1c, review of health maintenance items, education on nutrition, prognosis, documentation, and need for follow up.   Problem List Items Addressed This Visit       Cardiovascular and Mediastinum   Hypertension associated with diabetes (HCC) - Primary   Well-controlled hypertension Continue amlodipine  10 mg and lisinopril  20 mg daily Hemoglobin A1c at 6.9 Recommend to continue metformin  500 mg twice a day along with weekly Ozempic  2 mg and daily Jardiance  25 mg Cardiovascular risks associated with uncontrolled diabetes discussed Diet and nutrition discussed Benefits of exercise discussed Follow-up in 6 months      Chronic diastolic heart failure (HCC)   No signs of volume overload or congestive heart failure.         Endocrine   Dyslipidemia associated with type 2 diabetes mellitus (HCC)   Chronic stable conditions with hemoglobin A1c at 6.9 Continue metformin  and Jardiance  Continue weekly Ozempic  2 mg Continue atorvastatin  20 mg daily Diet and nutrition discussed Follow-up in 6 months         Relevant Orders   POCT HgB A1C (Completed)  Musculoskeletal and Integument   De Quervain's tenosynovitis, left   Active and affecting quality of life Recommend evaluation by sports medicine group Referral placed today      Relevant Orders   Ambulatory referral to Sports Medicine     Other   Class 2 severe obesity due to excess calories with serious comorbidity and body mass index (BMI) of 37.0 to 37.9 in adult [facility])   Diet and nutrition discussed Advised to decrease amount of daily carbohydrate intake and daily calories and increase amount of plant-based protein in his diet.      Patient Instructions  Irritation and Swelling of the Thumb (De Quervain's Syndrome): What to Know  Eddie Good Quervain's syndrome causes irritation and swelling of the tendon on the thumb side of the wrist. Tendons are strong tissues that connect muscle to bone.  The tendons in the hand go through a tunnel called a sheath. A slippery layer of tissue helps the tendons move through the sheath. With de Quervain's syndrome, the sheath swells or thickens. This can cause pressure and pain. De Quervain's syndrome is also called de Quervain's disease or de Quervain's tenosynovitis. What are the causes? The exact cause isn't known. It may be from overuse of the hand and wrist. What increases the risk? You're more likely to get [employer] syndrome if: You use your hands far more than normal. This includes if you do certain movements over and over, such as twisting your hand or using a tight grip. You're pregnant. You're male and middle-aged. You have rheumatoid arthritis. You have diabetes. What are the signs or symptoms? The main symptom is pain on the thumb side of your wrist. The pain may get worse when you grasp something or turn your wrist. You may also have: Pain that goes up your forearm. Swelling of your wrist and hand. Trouble moving your thumb and wrist. A feeling of snapping in the  wrist. A cyst. This is a pocket of fluid. How is this diagnosed? You may be diagnosed based on your symptoms, medical history, and an exam. The exam may include a Marvin Slot test. This is when your health care provider pulls your thumb and wrist to see if it causes pain. You may also have tests. These may include: An X-ray. An MRI. An ultrasound. How is this treated? You may need to: Avoid doing things that: Cause pain or swelling. Cause you to make the same movements with your hand or thumb over and over. Take medicines. These may include: Medicines to help with pain. Steroids to help with irritation and swelling. Wear a splint. Have surgery. This may be needed if other treatments don't work. Once the pain and swelling have gone down, you may start: Physical therapy. You may be given exercises to help with movement and strength in your wrist and thumb. Occupational therapy. This includes changing how you move your wrist. Follow these instructions at home: If you have a splint: Wear the splint as told. Take it off only if your provider says you can. Check the skin around it every day. Tell your provider if you see problems. Loosen the splint if your fingers tingle, are numb, or turn cold and blue. Keep the splint clean. If the splint isn't waterproof: Do not let it get wet. Cover it when you take a bath or shower. Use a cover that doesn't let any water in. Managing pain, stiffness, and swelling  Use ice or an ice pack as told. If you have a splint  that you can take off, remove it only as told. Place a towel between your skin and the ice. Leave the ice on for 20 minutes, 2-3 times a day. If your skin turns red, take off the ice right away to prevent skin damage. The risk of damage is higher if you can't feel pain, heat, or cold. Move your fingers often to reduce stiffness and swelling. Raise your hand above the level of your heart while you're sitting or lying down. Use pillows  as needed. General instructions Take your medicines only as told. Ask if it's OK for you to lift. Ask when it's safe to drive if you have a splint on your hand. Ask what things are safe for you to do at home. Ask when you can go back to work or school. Where to find more information To learn more: Go to orthoinfo.aaos.org. Click "Search." Type "De Quervain's tenosynovitis" into the search bar. Contact a health care provider if: Your pain doesn't get better with medicine. Your pain gets worse. You get new symptoms. This information is not intended to replace advice given to you by your health care provider. Make sure you discuss any questions you have with your health care provider. Document Revised: 12/13/2022 Document Reviewed: 12/13/2022 Elsevier Patient Education  2024 Elsevier Inc.     Maryagnes Small, MD [facility]

## 2023-08-31 NOTE — Assessment & Plan Note (Signed)
 Diet and nutrition discussed.  Advised to decrease amount of daily carbohydrate intake and daily calories and increase amount of plant based protein in his diet

## 2023-08-31 NOTE — Assessment & Plan Note (Signed)
 Active and affecting quality of life Recommend evaluation by sports medicine group Referral placed today

## 2023-08-31 NOTE — Telephone Encounter (Signed)
Okay for 6 months 

## 2023-08-31 NOTE — Patient Instructions (Signed)
 Irritation and Swelling of the Thumb (De Quervain's Syndrome): What to Know  Tommi Rumps Quervain's syndrome causes irritation and swelling of the tendon on the thumb side of the wrist. Tendons are strong tissues that connect muscle to bone.  The tendons in the hand go through a tunnel called a sheath. A slippery layer of tissue helps the tendons move through the sheath. With de Quervain's syndrome, the sheath swells or thickens. This can cause pressure and pain. De Quervain's syndrome is also called de Quervain's disease or de Quervain's tenosynovitis. What are the causes? The exact cause isn't known. It may be from overuse of the hand and wrist. What increases the risk? You're more likely to get [employer] syndrome if: You use your hands far more than normal. This includes if you do certain movements over and over, such as twisting your hand or using a tight grip. You're pregnant. You're male and middle-aged. You have rheumatoid arthritis. You have diabetes. What are the signs or symptoms? The main symptom is pain on the thumb side of your wrist. The pain may get worse when you grasp something or turn your wrist. You may also have: Pain that goes up your forearm. Swelling of your wrist and hand. Trouble moving your thumb and wrist. A feeling of snapping in the wrist. A cyst. This is a pocket of fluid. How is this diagnosed? You may be diagnosed based on your symptoms, medical history, and an exam. The exam may include a Lourena Simmonds test. This is when your health care provider pulls your thumb and wrist to see if it causes pain. You may also have tests. These may include: An X-ray. An MRI. An ultrasound. How is this treated? You may need to: Avoid doing things that: Cause pain or swelling. Cause you to make the same movements with your hand or thumb over and over. Take medicines. These may include: Medicines to help with pain. Steroids to help with irritation and swelling. Wear a  splint. Have surgery. This may be needed if other treatments don't work. Once the pain and swelling have gone down, you may start: Physical therapy. You may be given exercises to help with movement and strength in your wrist and thumb. Occupational therapy. This includes changing how you move your wrist. Follow these instructions at home: If you have a splint: Wear the splint as told. Take it off only if your provider says you can. Check the skin around it every day. Tell your provider if you see problems. Loosen the splint if your fingers tingle, are numb, or turn cold and blue. Keep the splint clean. If the splint isn't waterproof: Do not let it get wet. Cover it when you take a bath or shower. Use a cover that doesn't let any water in. Managing pain, stiffness, and swelling  Use ice or an ice pack as told. If you have a splint that you can take off, remove it only as told. Place a towel between your skin and the ice. Leave the ice on for 20 minutes, 2-3 times a day. If your skin turns red, take off the ice right away to prevent skin damage. The risk of damage is higher if you can't feel pain, heat, or cold. Move your fingers often to reduce stiffness and swelling. Raise your hand above the level of your heart while you're sitting or lying down. Use pillows as needed. General instructions Take your medicines only as told. Ask if it's OK for you to lift. Ask  when it's safe to drive if you have a splint on your hand. Ask what things are safe for you to do at home. Ask when you can go back to work or school. Where to find more information To learn more: Go to orthoinfo.aaos.org. Click "Search." Type "De Quervain's tenosynovitis" into the search bar. Contact a health care provider if: Your pain doesn't get better with medicine. Your pain gets worse. You get new symptoms. This information is not intended to replace advice given to you by your health care provider. Make sure you  discuss any questions you have with your health care provider. Document Revised: 12/13/2022 Document Reviewed: 12/13/2022 Elsevier Patient Education  2024 [employer].

## 2023-08-31 NOTE — Assessment & Plan Note (Signed)
 Chronic stable conditions with hemoglobin A1c at 6.9 Continue metformin  and Jardiance  Continue weekly Ozempic  2 mg Continue atorvastatin  20 mg daily Diet and nutrition discussed Follow-up in 6 months

## 2023-08-31 NOTE — Telephone Encounter (Signed)
 Patient called and said that he needs another handicap plague. CB#[phone]

## 2023-09-01 NOTE — Telephone Encounter (Signed)
 I left voicemail for patient advising ok for placard, however, I am in [place] office today. Advised I would place paperwork at the front desk for him tomorrow sometime after 0830 to pick up.

## 2023-09-02 NOTE — Telephone Encounter (Signed)
 Placed at front desk for pick up.

## 2023-09-05 NOTE — Progress Notes (Deleted)
    [name] D.[facility] [address] [place] [address] Phone: [phone]   Assessment and Plan:     There are no diagnoses linked to this encounter.  ***   Pertinent previous records reviewed include ***    Follow Up: ***     Subjective:   I, Scott [name], am serving as a Neurosurgeon for Scott [name]  Chief Complaint: left wrist pain   HPI:   09/06/2023 Patient is a 53 year old male with left wrist pain. Patient states   Relevant Historical Information: ***  Additional pertinent review of systems negative.   Current Outpatient Medications:    Aloe Vera OIL, by Does not apply route. Blue stop- Emu oil, Aloe oil, Coconut oil, Disp: , Rfl:    amLODipine  (NORVASC ) 10 MG tablet, Take 1 tablet (10 mg total) by mouth daily., Disp: 90 tablet, Rfl: 3   aspirin  81 MG chewable tablet, Chew by mouth daily., Disp: , Rfl:    atorvastatin  (LIPITOR) 20 MG tablet, Take 1 tablet (20 mg total) by mouth daily., Disp: 90 tablet, Rfl: 3   fluticasone  (FLONASE ) 50 MCG/ACT nasal spray, Place 2 sprays into both nostrils daily. (Patient taking differently: Place 2 sprays into both nostrils daily as needed for allergies.), Disp: 16 mL, Rfl: 0   glucose blood test strip, Use as instructed. Inject into the skin once daily. E11.65, Disp: 200 each, Rfl: 12   HYDROcodone -acetaminophen  (NORCO/VICODIN) 5-325 MG tablet, Take 1 tablet by mouth every 8 (eight) hours as needed for moderate pain (pain score 4-6)., Disp: 20 tablet, Rfl: 0   JARDIANCE  25 MG TABS tablet, Take 1 tablet (25 mg total) by mouth daily., Disp: 90 tablet, Rfl: 3   lisinopril  (ZESTRIL ) 20 MG tablet, Take 1 tablet (20 mg total) by mouth daily., Disp: 90 tablet, Rfl: 3   loratadine  (CLARITIN ) 10 MG tablet, Take 10 mg by mouth daily as needed for allergies., Disp: , Rfl:    metFORMIN  (GLUCOPHAGE ) 500 MG tablet, TAKE 1 TABLET BY MOUTH TWICE A DAY WITH A MEAL, Disp: 180 tablet, Rfl: 3    methocarbamol  (ROBAXIN ) 500 MG tablet, Take 1 tablet (500 mg total) by mouth every 8 (eight) hours as needed for muscle spasms., Disp: 30 tablet, Rfl: 1   Semaglutide , 2 MG/DOSE, 8 MG/3ML SOPN, Inject 2 mg into the skin once a week., Disp: 3 mL, Rfl: 5   Objective:     There were no vitals filed for this visit.    There is no height or weight on file to calculate BMI.    Physical Exam:    ***   Electronically signed by:  Marshall Skeeter D.[facility] 12:38 PM 09/05/23

## 2023-09-06 ENCOUNTER — Ambulatory Visit: Admitting: Sports Medicine

## 2023-09-15 ENCOUNTER — Ambulatory Visit: Admitting: Orthopedic Surgery

## 2023-09-15 ENCOUNTER — Encounter: Payer: Self-pay | Admitting: Orthopedic Surgery

## 2023-09-15 ENCOUNTER — Other Ambulatory Visit: Payer: Self-pay | Admitting: Emergency Medicine

## 2023-09-15 DIAGNOSIS — M1712 Unilateral primary osteoarthritis, left knee: Secondary | ICD-10-CM | POA: Diagnosis not present

## 2023-09-15 DIAGNOSIS — I152 Hypertension secondary to endocrine disorders: Secondary | ICD-10-CM

## 2023-09-15 MED ORDER — TRIAMCINOLONE ACETONIDE 40 MG/ML IJ SUSP
40.0000 mg | INTRAMUSCULAR | Status: AC | PRN
  Administered 2023-09-15: 40 mg via INTRA_ARTICULAR

## 2023-09-15 MED ORDER — BUPIVACAINE HCL 0.25 % IJ SOLN
4.0000 mL | INTRAMUSCULAR | Status: AC | PRN
  Administered 2023-09-15: 4 mL via INTRA_ARTICULAR

## 2023-09-15 MED ORDER — LIDOCAINE HCL 1 % IJ SOLN
5.0000 mL | INTRAMUSCULAR | Status: AC | PRN
  Administered 2023-09-15: 5 mL

## 2023-09-15 NOTE — Progress Notes (Deleted)
 Office Visit Note   [name]           Date of Birth: [birth year]           MRN: [id number] Visit Date: 09/15/2023 Requested by: Elvira Hammersmith, MD [address] [place],  [place] [address] PCP: Elvira Hammersmith, MD  Subjective: Chief Complaint  Patient presents with   Left Knee - Pain    Wants injection today    HPI: Scott [name] is a 53 y.o. male who presents to the office reporting ***.                ROS: All systems reviewed are negative as they relate to the chief complaint within the history of present illness.  Patient denies fevers or chills.  Assessment & Plan: Visit Diagnoses: No diagnosis found.  Plan: ***  Follow-Up Instructions: No follow-ups on file.   Orders:  No orders of the defined types were placed in this encounter.  No orders of the defined types were placed in this encounter.     Procedures: Large Joint Inj: L knee on 09/15/2023 1:43 PM Indications: diagnostic evaluation, joint swelling and pain Details: 18 G 1.5 in needle, superolateral approach  Arthrogram: No  Medications: 5 mL lidocaine  1 %; 4 mL bupivacaine  0.25 %; 40 mg triamcinolone  acetonide 40 MG/ML Outcome: tolerated well, no immediate complications Procedure, treatment alternatives, risks and benefits explained, specific risks discussed. Consent was given by the patient. Immediately prior to procedure a time out was called to verify the correct patient, procedure, equipment, support staff and site/side marked as required. Patient was prepped and draped in the usual sterile fashion.       Clinical Data: No additional findings.  Objective: Vital Signs: There were no vitals taken for this visit.  Physical Exam:  Constitutional: Patient appears well-developed HEENT:  Head: Normocephalic Eyes:EOM are normal Neck: Normal range of motion Cardiovascular: Normal rate Pulmonary/chest: Effort normal Neurologic: Patient is alert Skin: Skin is  warm Psychiatric: Patient has normal mood and affect  Ortho Exam: ***  Specialty Comments:  MRI LUMBAR SPINE WITHOUT CONTRAST     TECHNIQUE:  Multiplanar, multisequence MR imaging of the lumbar spine was  performed. No intravenous contrast was administered.     COMPARISON:  Prior radiograph from 01/16/2020 and MRI from  02/05/2017.     FINDINGS:  Segmentation:  Standard.     Alignment:  Physiologic.  No listhesis.     Vertebrae: Vertebral body height maintained without acute or chronic  fracture. Bone marrow signal intensity within normal limits. No  discrete or worrisome osseous lesions. Mild reactive marrow edema  noted about the right greater than left L4-5 facets due to facet  arthritis. No other abnormal marrow edema.     Conus medullaris and cauda equina: Conus extends to the L1 level.  Conus and cauda equina appear normal.     Paraspinal and other soft tissues: Unremarkable.     Disc levels:     L1-2:  Unremarkable.     L2-3:  Unremarkable.     L3-4: Normal interspace. Minimal facet spurring. No canal or  foraminal stenosis. No impingement.     L4-5: Disc desiccation. Shallow broad-based left subarticular disc  protrusion indents the left ventral thecal sac, encroaching upon the  left lateral recess. Associated central annular fissure. Moderate  bilateral facet hypertrophy, mildly progressed from previous.  Resultant mild canal with moderate left lateral recess stenosis,  descending L5 nerve root  level. Mild to moderate bilateral L4  foraminal narrowing. Appearance is overall relatively similar to  previous.     L5-S1: Normal interspace. Moderate right with mild left facet  hypertrophy, mildly progressed from previous. No stenosis or  impingement.     IMPRESSION:  1. Shallow left subarticular disc protrusion at L4-5 with resultant  mild canal and moderate left lateral recess stenosis, potentially  affecting the descending left L5 nerve root.  2.  Moderate bilateral facet hypertrophy at L4-5 and L5-S1, mildly  progressed from previous, and could contribute to lower back pain.        Electronically Signed    By: Virgia Griffins M.D.    On: 01/27/2020 01:46  Imaging: No results found.   PMFS History: Patient Active Problem List   Diagnosis Date Noted   De Quervain's tenosynovitis, left 08/31/2023   Arthritis of left acromioclavicular joint 03/27/2023   Osteoarthritis of right knee 03/16/2023   GERD (gastroesophageal reflux disease) 03/31/2021   History of colonic polyps 11/21/2020   Chronic left shoulder pain 11/20/2020   Hyperlipidemia 04/23/2020   Class 2 severe obesity due to excess calories with serious comorbidity and body mass index (BMI) of 37.0 to 37.9 in adult (HCC) 02/13/2019   Sleep apnea 01/23/2019   Chronic diastolic heart failure (HCC) 11/07/2016   OSA (obstructive sleep apnea) 04/18/2012   Left ventricular diastolic dysfunction, NYHA class 1/moderate LVH with concentric hypertrophy 09/03/2011   Dyslipidemia associated with type 2 diabetes mellitus (HCC) 09/02/2011   Hypertension associated with diabetes (HCC) 09/02/2011   Past Medical History:  Diagnosis Date   Arthritis    Carpal tunnel syndrome    Chronic migraine 01/23/2019   Diabetes mellitus    Diastolic heart failure (HCC)    DVT (deep venous thrombosis) (HCC)    left calf   GERD (gastroesophageal reflux disease)    Hypertension    Obesity    Pulmonary embolism (HCC) 2013   He has a history of HTN.  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 A repeat scan done in 2017 did not demonstrate evidence of emboli. Neither study demonstrated any coronary calcifications.    Pulmonary embolism (HCC)    Shoulder pain, bilateral    Sleep apnea    was fitted for mask and never got machine - too costly    Family History  Problem Relation Age of Onset   Hypertension Mother    Kidney disease Father    Hypertension Other     Hyperlipidemia Other    Diabetes Other     Past Surgical History:  Procedure Laterality Date   CARPAL TUNNEL RELEASE Right 12/04/2015   Procedure: RIGHT CARPAL TUNNEL RELEASE;  Surgeon: Brunilda Capra, MD;  Location: [facility];  Service: Orthopedics;  Laterality: Right;  RIGHT CARPAL TUNNEL RELEASE   CARPAL TUNNEL RELEASE Right 12/2015   [facility]   CARPAL TUNNEL RELEASE Left 04/01/2016   Procedure: LEFT CARPAL TUNNEL RELEASE;  Surgeon: Brunilda Capra, MD;  Location: [facility];  Service: Orthopedics;  Laterality: Left;   I & D EXTREMITY Left 02/19/2021   Procedure: LEFT FOOT SPUR REMOVAL;  Surgeon: Jasmine Mesi, MD;  Location: [facility] OR;  Service: Orthopedics;  Laterality: Left;   KNEE ARTHROSCOPY WITH MEDIAL MENISECTOMY Left 02/19/2021   Procedure: LEFT KNEE ARTHROSCOPY, MENISCAL CYST DECOMPRESSION;  Surgeon: Jasmine Mesi, MD;  Location: [facility] OR;  Service: Orthopedics;  Laterality: Left;   right hand surgery  ROTATOR CUFF REPAIR Right    Social History   Occupational History   Occupation: Customer service manager  Tobacco Use   Smoking status: Never   Smokeless tobacco: Never  Vaping Use   Vaping status: Never Used  Substance and Sexual Activity   Alcohol use: No    Alcohol/week: 0.0 standard drinks of alcohol   Drug use: No   Sexual activity: Yes

## 2023-09-15 NOTE — Progress Notes (Signed)
 Office Visit Note   [name]           Date of Birth: December 26, 1970           MRN: [id number] Visit Date: 09/15/2023 Requested by: Elvira Hammersmith, MD [address] [place],  [place] [address] PCP: Elvira Hammersmith, MD  Subjective: Chief Complaint  Patient presents with   Left Knee - Pain    Wants injection today    HPI: Scott [name] is a 53 y.o. male who presents to the office reporting left knee pain.  Had an injection about 3 to 4 months ago.  That helped him.  Gel shots do not help very much.  Overall he is managing with the knee the way it is with episodic injections.  Is doing well keeping his leg muscle strong.  Does not report any night pain or rest pain..                ROS: All systems reviewed are negative as they relate to the chief complaint within the history of present illness.  Patient denies fevers or chills.  Assessment & Plan: Visit Diagnoses:  1. Unilateral primary osteoarthritis, left knee     Plan: Impression is left knee mildly progressive arthritis but with full extension and no effusion today.  Cortisone injection performed today.  Would not want to do more than 2/year and in the stretch 3 injections/year.  Continue with nonweightbearing quad strengthening exercises.  He is not really clinically symptomatic enough yet for knee replacement.  He will follow-up with us  as needed.  Follow-Up Instructions: No follow-ups on file.   Orders:  Orders Placed This Encounter  Procedures   Large Joint Inj: L knee   No orders of the defined types were placed in this encounter.     Procedures: Large Joint Inj: L knee on 09/15/2023 1:44 PM Indications: diagnostic evaluation, joint swelling and pain Details: 18 G 1.5 in needle, superolateral approach  Arthrogram: No  Medications: 5 mL lidocaine  1 %; 4 mL bupivacaine  0.25 %; 40 mg triamcinolone  acetonide 40 MG/ML Outcome: tolerated well, no immediate complications Procedure,  treatment alternatives, risks and benefits explained, specific risks discussed. Consent was given by the patient. Immediately prior to procedure a time out was called to verify the correct patient, procedure, equipment, support staff and site/side marked as required. Patient was prepped and draped in the usual sterile fashion.       Clinical Data: No additional findings.  Objective: Vital Signs: There were no vitals taken for this visit.  Physical Exam:  Constitutional: Patient appears well-developed HEENT:  Head: Normocephalic Eyes:EOM are normal Neck: Normal range of motion Cardiovascular: Normal rate Pulmonary/chest: Effort normal Neurologic: Patient is alert Skin: Skin is warm Psychiatric: Patient has normal mood and affect  Ortho Exam: Ortho exam demonstrates normal gait and alignment.  Excellent quad tone bilaterally.  Full range of motion of that left knee.  Has medial greater than lateral joint line tenderness.  No effusion.  Collateral cruciate ligaments are stable.  Pedal pulses palpable.  Ankle dorsiflexion intact.  Specialty Comments:  MRI LUMBAR SPINE WITHOUT CONTRAST     TECHNIQUE:  Multiplanar, multisequence MR imaging of the lumbar spine was  performed. No intravenous contrast was administered.     COMPARISON:  Prior radiograph from 01/16/2020 and MRI from  02/05/2017.     FINDINGS:  Segmentation:  Standard.     Alignment:  Physiologic.  No listhesis.  Vertebrae: Vertebral body height maintained without acute or chronic  fracture. Bone marrow signal intensity within normal limits. No  discrete or worrisome osseous lesions. Mild reactive marrow edema  noted about the right greater than left L4-5 facets due to facet  arthritis. No other abnormal marrow edema.     Conus medullaris and cauda equina: Conus extends to the L1 level.  Conus and cauda equina appear normal.     Paraspinal and other soft tissues: Unremarkable.     Disc levels:     L1-2:   Unremarkable.     L2-3:  Unremarkable.     L3-4: Normal interspace. Minimal facet spurring. No canal or  foraminal stenosis. No impingement.     L4-5: Disc desiccation. Shallow broad-based left subarticular disc  protrusion indents the left ventral thecal sac, encroaching upon the  left lateral recess. Associated central annular fissure. Moderate  bilateral facet hypertrophy, mildly progressed from previous.  Resultant mild canal with moderate left lateral recess stenosis,  descending L5 nerve root level. Mild to moderate bilateral L4  foraminal narrowing. Appearance is overall relatively similar to  previous.     L5-S1: Normal interspace. Moderate right with mild left facet  hypertrophy, mildly progressed from previous. No stenosis or  impingement.     IMPRESSION:  1. Shallow left subarticular disc protrusion at L4-5 with resultant  mild canal and moderate left lateral recess stenosis, potentially  affecting the descending left L5 nerve root.  2. Moderate bilateral facet hypertrophy at L4-5 and L5-S1, mildly  progressed from previous, and could contribute to lower back pain.        Electronically Signed    By: Virgia Griffins M.D.    On: 01/27/2020 01:46  Imaging: No results found.   PMFS History: Patient Active Problem List   Diagnosis Date Noted   De Quervain's tenosynovitis, left 08/31/2023   Arthritis of left acromioclavicular joint 03/27/2023   Osteoarthritis of right knee 03/16/2023   GERD (gastroesophageal reflux disease) 03/31/2021   History of colonic polyps 11/21/2020   Chronic left shoulder pain 11/20/2020   Hyperlipidemia 04/23/2020   Class 2 severe obesity due to excess calories with serious comorbidity and body mass index (BMI) of 37.0 to 37.9 in adult (HCC) 02/13/2019   Sleep apnea 01/23/2019   Chronic diastolic heart failure (HCC) 11/07/2016   OSA (obstructive sleep apnea) 04/18/2012   Left ventricular diastolic dysfunction, NYHA class  1/moderate LVH with concentric hypertrophy 09/03/2011   Dyslipidemia associated with type 2 diabetes mellitus (HCC) 09/02/2011   Hypertension associated with diabetes (HCC) 09/02/2011   Past Medical History:  Diagnosis Date   Arthritis    Carpal tunnel syndrome    Chronic migraine 01/23/2019   Diabetes mellitus    Diastolic heart failure (HCC)    DVT (deep venous thrombosis) (HCC)    left calf   GERD (gastroesophageal reflux disease)    Hypertension    Obesity    Pulmonary embolism (HCC) 2013   He has a history of HTN.  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 A repeat scan done in 2017 did not demonstrate evidence of emboli. Neither study demonstrated any coronary calcifications.    Pulmonary embolism (HCC)    Shoulder pain, bilateral    Sleep apnea    was fitted for mask and never got machine - too costly    Family History  Problem Relation Age of Onset   Hypertension Mother    Kidney disease Father  Hypertension Other    Hyperlipidemia Other    Diabetes Other     Past Surgical History:  Procedure Laterality Date   CARPAL TUNNEL RELEASE Right 12/04/2015   Procedure: RIGHT CARPAL TUNNEL RELEASE;  Surgeon: Brunilda Capra, MD;  Location: [facility];  Service: Orthopedics;  Laterality: Right;  RIGHT CARPAL TUNNEL RELEASE   CARPAL TUNNEL RELEASE Right 12/2015   [facility]   CARPAL TUNNEL RELEASE Left 04/01/2016   Procedure: LEFT CARPAL TUNNEL RELEASE;  Surgeon: Brunilda Capra, MD;  Location: [facility];  Service: Orthopedics;  Laterality: Left;   I & D EXTREMITY Left 02/19/2021   Procedure: LEFT FOOT SPUR REMOVAL;  Surgeon: Jasmine Mesi, MD;  Location: [facility] OR;  Service: Orthopedics;  Laterality: Left;   KNEE ARTHROSCOPY WITH MEDIAL MENISECTOMY Left 02/19/2021   Procedure: LEFT KNEE ARTHROSCOPY, MENISCAL CYST DECOMPRESSION;  Surgeon: Jasmine Mesi, MD;  Location: [facility] OR;  Service: Orthopedics;  Laterality: Left;    right hand surgery     ROTATOR CUFF REPAIR Right    Social History   Occupational History   Occupation: real estate agent  Tobacco Use   Smoking status: Never   Smokeless tobacco: Never  Vaping Use   Vaping status: Never Used  Substance and Sexual Activity   Alcohol use: No    Alcohol/week: 0.0 standard drinks of alcohol   Drug use: No   Sexual activity: Yes

## 2023-09-22 ENCOUNTER — Telehealth: Payer: Self-pay

## 2023-09-22 ENCOUNTER — Other Ambulatory Visit: Payer: Self-pay | Admitting: Emergency Medicine

## 2023-09-22 MED ORDER — DIMENHYDRINATE 50 MG PO TABS: 50.0000 mg | ORAL_TABLET | Freq: Three times a day (TID) | ORAL | 0 refills | Status: AC | PRN

## 2023-09-22 NOTE — Telephone Encounter (Signed)
 New prescription for Dramamine 50 mg tablets sent to pharmacy of record today.  Thanks.

## 2023-09-22 NOTE — Telephone Encounter (Signed)
 Left detailed message informing patient Dr. Vedia Geralds sent in a rx for his request for Dramamine.

## 2023-09-22 NOTE — Telephone Encounter (Signed)
 Copied from CRM [id number]. Topic: Clinical - Medication Question >> Sep 22, 2023 10:06 AM Sophia H wrote: Reason for CRM: Pt states he is going on a trip soon and wanting to know if Dr. Vedia Geralds can send in an rx for dramamine. States regular OTC doesn't help much. Preferred pharmacy is [facility] [id number] [place], [place] [address]  Best contact # [phone]

## 2023-09-23 ENCOUNTER — Ambulatory Visit: Payer: Self-pay

## 2023-09-23 ENCOUNTER — Telehealth: Payer: Self-pay | Admitting: Emergency Medicine

## 2023-09-23 ENCOUNTER — Other Ambulatory Visit: Payer: Self-pay | Admitting: Emergency Medicine

## 2023-09-23 MED ORDER — SCOPOLAMINE 1 MG/3DAYS TD PT72: 1.0000 | MEDICATED_PATCH | TRANSDERMAL | 12 refills | Status: AC

## 2023-09-23 NOTE — Telephone Encounter (Signed)
 Insurance not covering dramamine tablets, patient requesting to change prescription to dramamine transdermal patches. Please advise.    Copied from CRM [id number]. Topic: Clinical - Prescription Issue >> Sep 23, 2023  2:48 PM Jim Motts C wrote: Reason for CRM: Patient called in requesting if he could have Transderm-motion sickness patches sent to the pharmacy instead. Patient may be contacted at [phone]. Reason for Disposition . Prescription request for new medicine (not a refill)  Protocols used: Medication Refill and Renewal Call-A-AH

## 2023-09-23 NOTE — Telephone Encounter (Signed)
 Copied from CRM [id number]. Topic: Clinical - Prescription Issue >> Sep 23, 2023  9:18 AM Zipporah Him wrote: Reason for CRM: Patient is wanting to know if he can get dramamine patches instead of the pills. States his insurance is not wanting to pay for the pills

## 2023-09-23 NOTE — Telephone Encounter (Signed)
 Rx has been sent

## 2023-09-23 NOTE — Telephone Encounter (Signed)
 Prescription for Transderm scop  sent to pharmacy of record today.  Thanks.

## 2023-09-23 NOTE — Telephone Encounter (Signed)
 LVM informing patient regarding new rx that has been sent

## 2023-10-12 ENCOUNTER — Ambulatory Visit: Payer: Self-pay

## 2023-10-12 NOTE — Telephone Encounter (Signed)
 Copied from CRM [id number]. Topic: Clinical - Red Word Triage >> Oct 12, 2023 12:52 PM Deaijah H wrote: Red Word that prompted transfer to Nurse Triage: Red in stool (unsure if it the juice that he drank or not) and left ear has been aching sometimes (hard to touch) and stopped up   Reason for Disposition  Unusual stool color probably from food or medicine    Appointment made per patient's request  Answer Assessment - Initial Assessment Questions 1. COLOR: What color is it? Is that color in part or all of the stool?     Red 2. ONSET: When was the unusual color first noted?     1-2 weeks ago 3. CAUSE: Have you eaten any food or taken any medicine of this color? Note: See listing in Background Information section.      Believes it may be due to something he drank 4. OTHER SYMPTOMS: Do you have any other symptoms? (e.g., abdomen pain, diarrhea, jaundice, fever).     Ear pain  Protocols used: Stools - Unusual Color-A-AH   FYI Only or Action Required?: FYI only for provider  Patient was last seen in primary care on 08/31/2023 by Elvira Hammersmith, MD. Called Nurse Triage reporting Stool Color Change and Otalgia. Symptoms began a week ago. Interventions attempted: Nothing. Symptoms are: Ear pain present and ongoing problem, stool color back to normal.  Triage Disposition: Appointment per patient's request.   Patient/caregiver understands and will follow disposition?: Yes

## 2023-10-13 ENCOUNTER — Ambulatory Visit: Admitting: Family Medicine

## 2023-10-13 NOTE — Progress Notes (Deleted)
   Acute Office Visit  Subjective:     Patient ID: Scott [name], male    DOB: 1970/05/30, 53 y.o.   MRN: [id number]  No chief complaint on file.   HPI Patient is in today for evaluation of funny red substance in his stool. Also reports that he has been having some ear pain  ROS Per HPI      Objective:    There were no vitals taken for this visit.   Physical Exam Vitals and nursing note reviewed.  Constitutional:      General: He is not in acute distress.    Appearance: Normal appearance.  HENT:     Head: Normocephalic and atraumatic.     Right Ear: External ear normal.     Left Ear: External ear normal.     Nose: Nose normal.     Mouth/Throat:     Mouth: Mucous membranes are moist.     Pharynx: Oropharynx is clear.   Eyes:     Extraocular Movements: Extraocular movements intact.    Cardiovascular:     Rate and Rhythm: Normal rate and regular rhythm.     Pulses: Normal pulses.     Heart sounds: Normal heart sounds.  Pulmonary:     Effort: Pulmonary effort is normal. No respiratory distress.     Breath sounds: Normal breath sounds. No wheezing, rhonchi or rales.   Musculoskeletal:        General: Normal range of motion.     Cervical back: Normal range of motion.     Right lower leg: No edema.     Left lower leg: No edema.  Lymphadenopathy:     Cervical: No cervical adenopathy.   Skin:    General: Skin is warm and dry.   Neurological:     General: No focal deficit present.     Mental Status: He is alert and oriented to person, place, and time.   Psychiatric:        Mood and Affect: Mood normal.        Behavior: Behavior normal.    No results found for any visits on 10/14/23.      Assessment & Plan:   There are no diagnoses linked to this encounter.   No orders of the defined types were placed in this encounter.   No follow-ups on file.  Wellington Half, FNP

## 2023-10-13 NOTE — Progress Notes (Deleted)
   Acute Office Visit  Subjective:     Patient ID: Scott [name], male    DOB: 12-12-1970, 53 y.o.   MRN: [id number]  No chief complaint on file.   HPI Patient is in today for evaluation of funny red substance in his stool. Also reports that he has been having some ear pain  ROS Per HPI      Objective:    There were no vitals taken for this visit.   Physical Exam Vitals and nursing note reviewed.  Constitutional:      General: He is not in acute distress.    Appearance: Normal appearance.  HENT:     Head: Normocephalic and atraumatic.     Right Ear: External ear normal.     Left Ear: External ear normal.     Nose: Nose normal.     Mouth/Throat:     Mouth: Mucous membranes are moist.     Pharynx: Oropharynx is clear.   Eyes:     Extraocular Movements: Extraocular movements intact.    Cardiovascular:     Rate and Rhythm: Normal rate and regular rhythm.     Pulses: Normal pulses.     Heart sounds: Normal heart sounds.  Pulmonary:     Effort: Pulmonary effort is normal. No respiratory distress.     Breath sounds: Normal breath sounds. No wheezing, rhonchi or rales.   Musculoskeletal:        General: Normal range of motion.     Cervical back: Normal range of motion.     Right lower leg: No edema.     Left lower leg: No edema.  Lymphadenopathy:     Cervical: No cervical adenopathy.   Skin:    General: Skin is warm and dry.   Neurological:     General: No focal deficit present.     Mental Status: He is alert and oriented to person, place, and time.   Psychiatric:        Mood and Affect: Mood normal.        Behavior: Behavior normal.   No results found for any visits on 10/13/23.      Assessment & Plan:   There are no diagnoses linked to this encounter.   No orders of the defined types were placed in this encounter.   No follow-ups on file.  Wellington Half, FNP

## 2023-10-14 ENCOUNTER — Ambulatory Visit (INDEPENDENT_AMBULATORY_CARE_PROVIDER_SITE_OTHER): Admitting: Family Medicine

## 2023-10-14 ENCOUNTER — Encounter: Payer: Self-pay | Admitting: Family Medicine

## 2023-10-14 VITALS — BP 120/84 | HR 92 | Temp 98.5°F | Resp 16 | Ht 66.0 in | Wt 227.0 lb

## 2023-10-14 DIAGNOSIS — Z23 Encounter for immunization: Secondary | ICD-10-CM

## 2023-10-14 DIAGNOSIS — G8929 Other chronic pain: Secondary | ICD-10-CM | POA: Diagnosis not present

## 2023-10-14 DIAGNOSIS — H9202 Otalgia, left ear: Secondary | ICD-10-CM | POA: Diagnosis not present

## 2023-10-14 DIAGNOSIS — K921 Melena: Secondary | ICD-10-CM | POA: Diagnosis not present

## 2023-10-14 LAB — COMPREHENSIVE METABOLIC PANEL WITH GFR
ALT: 47 U/L (ref 0–53)
AST: 29 U/L (ref 0–37)
Albumin: 4.5 g/dL (ref 3.5–5.2)
Alkaline Phosphatase: 48 U/L (ref 39–117)
BUN: 14 mg/dL (ref 6–23)
CO2: 25 meq/L (ref 19–32)
Calcium: 9.2 mg/dL (ref 8.4–10.5)
Chloride: 105 meq/L (ref 96–112)
Creatinine, Ser: 1.14 mg/dL (ref 0.40–1.50)
GFR: 73.12 mL/min (ref >60.00)
Glucose, Bld: 125 mg/dL — ABNORMAL HIGH (ref 70–99)
Potassium: 4.2 meq/L (ref 3.5–5.1)
Sodium: 138 meq/L (ref 135–145)
Total Bilirubin: 0.9 mg/dL (ref 0.2–1.2)
Total Protein: 7.3 g/dL (ref 6.0–8.3)

## 2023-10-14 LAB — CBC WITH DIFFERENTIAL/PLATELET
Basophils Absolute: 0 10*3/uL (ref 0.0–0.1)
Basophils Relative: 0.4 % (ref 0.0–3.0)
Eosinophils Absolute: 0 10*3/uL (ref 0.0–0.7)
Eosinophils Relative: 0.9 % (ref 0.0–5.0)
HCT: 50.2 % (ref 39.0–52.0)
Hemoglobin: 16.2 g/dL (ref 13.0–17.0)
Lymphocytes Relative: 40.6 % (ref 12.0–46.0)
Lymphs Abs: 2 10*3/uL (ref 0.7–4.0)
MCHC: 32.4 g/dL (ref 30.0–36.0)
MCV: 77.5 fl — ABNORMAL LOW (ref 78.0–100.0)
Monocytes Absolute: 0.6 10*3/uL (ref 0.1–1.0)
Monocytes Relative: 11.6 % (ref 3.0–12.0)
Neutro Abs: 2.3 10*3/uL (ref 1.4–7.7)
Neutrophils Relative %: 46.5 % (ref 43.0–77.0)
Platelets: 252 10*3/uL (ref 150.0–400.0)
RBC: 6.47 Mil/uL — ABNORMAL HIGH (ref 4.22–5.81)
RDW: 14.9 % (ref 11.5–15.5)
WBC: 4.9 10*3/uL (ref 4.0–10.5)

## 2023-10-14 NOTE — Assessment & Plan Note (Signed)
 Likely eustachian tube dysfunction. Instructed patient on eustachian tube massage. Patient to return to office as needed for persistent or worsening symptoms.

## 2023-10-14 NOTE — Progress Notes (Unsigned)
 Acute Office Visit  Subjective:     Patient ID: Scott [name], male    DOB: 07/28/1970, 53 y.o.   MRN: [id number]  No chief complaint on file.   HPI Patient presents with complaint of blood in his stool x 1 approximately 1.5 weeks ago while on a cruise. Reports that he is unsure if it was blood or results from drinking a red juice the day before. He denies abdominal pain but reports he experienced cramping and an uneasy stomach around the time of this incident. He states he took Imodium. He denies nausea, vomiting, diarrhea, or constipation. Reports he has had issues with hemorrhoids in the past but does not feel he is experiencing issues at this time.   He also complains of intermittent left ear pain for 1 year. Reports that these episodes are infrequent and last 30 minutes or less with each episode. He describes that pain as dull ache that involves the pinna, tragus, and inner ear. Also reports preauricular and postauricular pain. He is not experiencing that pain today. States that he does not notice any additional symptoms when this occurs.   ROS  See HPI    Objective:    BP 120/84 (BP Location: Left Arm, Patient Position: Sitting)   Pulse 92   Temp 98.5 F (36.9 C) (Temporal)   Resp 16   Ht 5' 6 (1.676 m)   Wt 227 lb (103 kg)   SpO2 96%   BMI 36.64 kg/m  {Vitals History (Optional):23777}  Physical Exam Vitals reviewed.  Constitutional:      General: He is not in acute distress.    Appearance: Normal appearance. He is not ill-appearing.  HENT:     Head: Normocephalic and atraumatic.     Right Ear: Tympanic membrane normal.     Left Ear: Tympanic membrane normal.     Nose: Nose normal.     Mouth/Throat:     Mouth: Mucous membranes are moist.     Pharynx: Oropharynx is clear.   Cardiovascular:     Rate and Rhythm: Normal rate and regular rhythm.     Pulses: Normal pulses.     Heart sounds: Normal heart sounds.  Pulmonary:     Effort: Pulmonary effort is  normal.     Breath sounds: Normal breath sounds.  Abdominal:     General: There is no distension.     Palpations: Abdomen is soft.     Tenderness: There is no abdominal tenderness.   Skin:    General: Skin is warm and dry.     Capillary Refill: Capillary refill takes less than 2 seconds.   Neurological:     Mental Status: He is alert and oriented to person, place, and time.   Psychiatric:        Mood and Affect: Mood normal.        Behavior: Behavior normal.     No results found for any visits on 10/14/23.      Assessment & Plan:   Problem List Items Addressed This Visit       Other   Chronic left ear pain   Likely eustachian tube dysfunction. Instructed patient on eustachian tube massage. Patient to return to office as needed for persistent or worsening symptoms.       Other Visit Diagnoses       Blood in stool    -  Primary   Relevant Orders   CBC w/Diff   Fecal Occult Blood, Guaiac  Comp Met (CMET)     Immunization due       Relevant Orders   Pneumococcal conjugate vaccine 20-valent (Prevnar 20) (Completed)       No orders of the defined types were placed in this encounter.   Return if symptoms worsen or fail to improve.  Wilford Hanks, RN

## 2023-10-15 ENCOUNTER — Encounter: Payer: Self-pay | Admitting: Family Medicine

## 2023-10-15 ENCOUNTER — Ambulatory Visit: Payer: Self-pay | Admitting: Family Medicine

## 2023-10-19 ENCOUNTER — Other Ambulatory Visit (INDEPENDENT_AMBULATORY_CARE_PROVIDER_SITE_OTHER)

## 2023-10-19 DIAGNOSIS — D500 Iron deficiency anemia secondary to blood loss (chronic): Secondary | ICD-10-CM

## 2023-10-19 DIAGNOSIS — K921 Melena: Secondary | ICD-10-CM

## 2023-10-19 LAB — FECAL OCCULT BLOOD, IMMUNOCHEMICAL: Fecal Occult Bld: NEGATIVE

## 2023-10-19 NOTE — Addendum Note (Signed)
 Addended by: Alcie Runions on: 10/19/2023 09:21 AM   Modules accepted: Orders

## 2023-10-19 NOTE — Addendum Note (Signed)
 Addended by: Anuoluwapo Mefferd on: 10/19/2023 09:26 AM   Modules accepted: Orders

## 2023-10-19 NOTE — Progress Notes (Signed)
 Scott [name]

## 2023-10-24 ENCOUNTER — Ambulatory Visit: Payer: Self-pay | Admitting: Family Medicine

## 2023-11-01 ENCOUNTER — Encounter: Payer: Self-pay | Admitting: Family Medicine

## 2023-11-01 ENCOUNTER — Ambulatory Visit (INDEPENDENT_AMBULATORY_CARE_PROVIDER_SITE_OTHER): Admitting: Family Medicine

## 2023-11-01 VITALS — BP 118/80 | HR 100 | Temp 97.5°F | Ht 66.0 in | Wt 232.6 lb

## 2023-11-01 DIAGNOSIS — J029 Acute pharyngitis, unspecified: Secondary | ICD-10-CM | POA: Diagnosis not present

## 2023-11-01 DIAGNOSIS — R0982 Postnasal drip: Secondary | ICD-10-CM | POA: Diagnosis not present

## 2023-11-01 MED ORDER — LEVOCETIRIZINE DIHYDROCHLORIDE 5 MG PO TABS: 5.0000 mg | ORAL_TABLET | Freq: Every evening | ORAL | 1 refills | Status: DC

## 2023-11-01 NOTE — Progress Notes (Signed)
   Acute Office Visit  Subjective:     Patient ID: Scott [name], male    DOB: Jun 07, 1970, 53 y.o.   MRN: [id number]  Chief Complaint  Patient presents with   Acute Visit    Ongoing for about 2 weeks, cough occasionally.     HPI Patient is in today for evaluation of sore throat, for the last 2 weeks.  He was seen in this office about that time with left ear pain. Ear pain has improved, now it comes and goes.  Has tried claritin  yesterday with little relief. Has nasal spray, but has not used it. Denies known sick contacts, Denies abdominal pain, nausea, vomiting, diarrhea, rash, fever, chills, other symptoms.  Medical hx as outlined below.  ROS Per HPI      Objective:    BP 118/80 (BP Location: Left Arm, Patient Position: Sitting)   Pulse 100   Temp (!) 97.5 F (36.4 C) (Temporal)   Ht 5' 6 (1.676 m)   Wt 232 lb 9.6 oz (105.5 kg)   SpO2 98%   BMI 37.54 kg/m    Physical Exam Vitals and nursing note reviewed.  Constitutional:      General: He is not in acute distress.    Appearance: Normal appearance.     Comments: Appears fatigued  HENT:     Head: Normocephalic and atraumatic.     Right Ear: Tympanic membrane and ear canal normal. No middle ear effusion. Tympanic membrane is not erythematous or bulging.     Left Ear: Tympanic membrane and ear canal normal.  No middle ear effusion. Tympanic membrane is not erythematous or bulging.     Nose: No congestion.     Mouth/Throat:     Mouth: Mucous membranes are moist.     Pharynx: Oropharynx is clear. No oropharyngeal exudate or posterior oropharyngeal erythema.     Comments: Oropharyngeal cobblestoning    Eyes:     Extraocular Movements: Extraocular movements intact.    Cardiovascular:     Rate and Rhythm: Normal rate and regular rhythm.     Heart sounds: Normal heart sounds.  Pulmonary:     Effort: Pulmonary effort is normal. No respiratory distress.     Breath sounds: No wheezing, rhonchi or rales.    Musculoskeletal:     Cervical back: Normal range of motion.  Lymphadenopathy:     Cervical: Cervical adenopathy present.   Neurological:     General: No focal deficit present.     Mental Status: He is alert and oriented to person, place, and time.    No results found for any visits on 11/01/23.      Assessment & Plan:   Sore throat -     Levocetirizine Dihydrochloride; Take 1 tablet (5 mg total) by mouth every evening.  Dispense: 30 tablet; Refill: 1  Post-nasal drip -     Levocetirizine Dihydrochloride; Take 1 tablet (5 mg total) by mouth every evening.  Dispense: 30 tablet; Refill: 1     Meds ordered this encounter  Medications   levocetirizine (XYZAL) 5 MG tablet    Sig: Take 1 tablet (5 mg total) by mouth every evening.    Dispense:  30 tablet    Refill:  1    Return if symptoms worsen or fail to improve.  Corean LITTIE Ku, FNP

## 2023-11-01 NOTE — Patient Instructions (Signed)
 I have sent in levocetirizine to your pharmacy.  May take one tablet once daily, at least for the next 2 weeks.   May start nasal spray after a week if symptoms are persisting.   Follow-up with me for new or worsening symptoms.

## 2023-11-09 ENCOUNTER — Other Ambulatory Visit: Payer: Self-pay

## 2023-11-09 ENCOUNTER — Telehealth: Payer: Self-pay | Admitting: Family Medicine

## 2023-11-09 DIAGNOSIS — J029 Acute pharyngitis, unspecified: Secondary | ICD-10-CM

## 2023-11-09 DIAGNOSIS — R0982 Postnasal drip: Secondary | ICD-10-CM

## 2023-11-09 DIAGNOSIS — G8929 Other chronic pain: Secondary | ICD-10-CM

## 2023-11-09 NOTE — Telephone Encounter (Signed)
 Copied from CRM [id number]. Topic: General - Other >> Nov 09, 2023 11:52 AM Scott [name] wrote: Reason for CRM: Patient said his throat is still soar and his provider advised him to call back and let her know if his throat does not get better so he can see a Ear Nose and Throat doctor.

## 2023-11-09 NOTE — Telephone Encounter (Signed)
 Referral placed, patient notified.

## 2023-12-19 ENCOUNTER — Ambulatory Visit (INDEPENDENT_AMBULATORY_CARE_PROVIDER_SITE_OTHER): Admitting: Orthopedic Surgery

## 2023-12-19 ENCOUNTER — Other Ambulatory Visit: Payer: Self-pay

## 2023-12-19 ENCOUNTER — Other Ambulatory Visit (INDEPENDENT_AMBULATORY_CARE_PROVIDER_SITE_OTHER)

## 2023-12-19 DIAGNOSIS — M19012 Primary osteoarthritis, left shoulder: Secondary | ICD-10-CM

## 2023-12-19 DIAGNOSIS — M1712 Unilateral primary osteoarthritis, left knee: Secondary | ICD-10-CM | POA: Diagnosis not present

## 2023-12-19 DIAGNOSIS — M7542 Impingement syndrome of left shoulder: Secondary | ICD-10-CM

## 2023-12-20 ENCOUNTER — Encounter: Payer: Self-pay | Admitting: Orthopedic Surgery

## 2023-12-20 DIAGNOSIS — M7542 Impingement syndrome of left shoulder: Secondary | ICD-10-CM | POA: Diagnosis not present

## 2023-12-20 MED ORDER — LIDOCAINE HCL 1 % IJ SOLN
5.0000 mL | INTRAMUSCULAR | Status: AC | PRN
Start: 2023-12-19 — End: 2023-12-19
  Administered 2023-12-19: 5 mL

## 2023-12-20 MED ORDER — BUPIVACAINE HCL 0.5 % IJ SOLN
9.0000 mL | INTRAMUSCULAR | Status: AC | PRN
  Administered 2023-12-20: 9 mL via INTRA_ARTICULAR

## 2023-12-20 MED ORDER — BUPIVACAINE HCL 0.25 % IJ SOLN
4.0000 mL | INTRAMUSCULAR | Status: AC | PRN
  Administered 2023-12-19: 4 mL via INTRA_ARTICULAR

## 2023-12-20 MED ORDER — TRIAMCINOLONE ACETONIDE 40 MG/ML IJ SUSP
40.0000 mg | INTRAMUSCULAR | Status: AC | PRN
  Administered 2023-12-20: 40 mg via INTRA_ARTICULAR

## 2023-12-20 MED ORDER — LIDOCAINE HCL 1 % IJ SOLN
5.0000 mL | INTRAMUSCULAR | Status: AC | PRN
  Administered 2023-12-20: 5 mL

## 2023-12-20 MED ORDER — TRIAMCINOLONE ACETONIDE 40 MG/ML IJ SUSP
40.0000 mg | INTRAMUSCULAR | Status: AC | PRN
Start: 2023-12-19 — End: 2023-12-19
  Administered 2023-12-19: 40 mg via INTRA_ARTICULAR

## 2023-12-20 NOTE — Progress Notes (Signed)
 Office Visit Note   [name]           Date of Birth: [birth year]           MRN: [id number] Visit Date: 12/19/2023 Requested by: Alvia Corean CROME, FNP [address] [place],  [place] [address] PCP: Alvia Corean CROME, FNP  Subjective: Chief Complaint  Patient presents with   Left Knee - Follow-up   Left Shoulder - Follow-up    HPI: Scott [name] is a 53 y.o. male who presents to the office reporting left shoulder and left knee pain.  Patient had left knee injection 09/15/2023 which helped for 3 months.  Also had AC joint injection on 07/06/2023 which helped for a month.  Has had prior left shoulder arthroscopy and AC joint resection which she did well with for a while but then he developed some recurrent pain.  Does have some bone spur overgrowth at the distal end of the clavicle which does not appear to impinge with in the [facility] joint itself in terms of touching the acromion.  Abduction does hurt him primarily in the lateral aspect of the shoulder around the deltoid.  Also has some superior pain.  Has longstanding chronic left knee pain with history of prior meniscectomy and some arthritis in that medial compartment..                ROS: All systems reviewed are negative as they relate to the chief complaint within the history of present illness.  Patient denies fevers or chills.  Assessment & Plan: Visit Diagnoses:  1. Arthritis of left acromioclavicular joint     Plan: Impression is left shoulder pain which looks like it may be bursitis today.  Would like to inject that shoulder bursa today just to see how much pain relief he gets.  Trying to determine what the pain generators are in that shoulder at this time whether it is the bony overgrowth from the distal clavicle excision versus bursitis.  Left knee also injected and we would like to have that last until next year.  Follow-up as needed.  He will let me know via MyChart how his shoulder did  short-term yesterday with the injection.  Follow-Up Instructions: No follow-ups on file.   Orders:  Orders Placed This Encounter  Procedures   XR Shoulder Left   No orders of the defined types were placed in this encounter.     Procedures: Large Joint Inj: L subacromial bursa on 12/20/2023 10:37 AM Indications: diagnostic evaluation and pain Details: 18 G 1.5 in needle, posterior approach  Arthrogram: No  Medications: 9 mL bupivacaine  0.5 %; 5 mL lidocaine  1 %; 40 mg triamcinolone  acetonide 40 MG/ML Outcome: tolerated well, no immediate complications Procedure, treatment alternatives, risks and benefits explained, specific risks discussed. Consent was given by the patient. Immediately prior to procedure a time out was called to verify the correct patient, procedure, equipment, support staff and site/side marked as required. Patient was prepped and draped in the usual sterile fashion.    Large Joint Inj: L knee on 12/19/2023 10:37 AM Indications: diagnostic evaluation, joint swelling and pain Details: 18 G 1.5 in needle, superolateral approach  Arthrogram: No  Medications: 5 mL lidocaine  1 %; 4 mL bupivacaine  0.25 %; 40 mg triamcinolone  acetonide 40 MG/ML Outcome: tolerated well, no immediate complications Procedure, treatment alternatives, risks and benefits explained, specific risks discussed. Consent was given by the patient. Immediately prior to procedure a time out was  called to verify the correct patient, procedure, equipment, support staff and site/side marked as required. Patient was prepped and draped in the usual sterile fashion.     3 4 cc Kenalog  injected into the left shoulder subacromial space  Clinical Data: No additional findings.  Objective: Vital Signs: There were no vitals taken for this visit.  Physical Exam:  Constitutional: Patient appears well-developed HEENT:  Head: Normocephalic Eyes:EOM are normal Neck: Normal range of motion Cardiovascular:  Normal rate Pulmonary/chest: Effort normal Neurologic: Patient is alert Skin: Skin is warm Psychiatric: Patient has normal mood and affect  Ortho Exam: Ortho exam demonstrates some pain with abduction on the left-hand side with positive impingement signs.  Does have some mild tenderness at the [facility] joint to direct palpation.  Rotator cuff strength is intact infraspinatus supraspinatus and subscap muscle testing.  Left knee no effusion full range of motion good quad strength and medial greater than lateral joint line tenderness  Specialty Comments:  MRI LUMBAR SPINE WITHOUT CONTRAST     TECHNIQUE:  Multiplanar, multisequence MR imaging of the lumbar spine was  performed. No intravenous contrast was administered.     COMPARISON:  Prior radiograph from 01/16/2020 and MRI from  02/05/2017.     FINDINGS:  Segmentation:  Standard.     Alignment:  Physiologic.  No listhesis.     Vertebrae: Vertebral body height maintained without acute or chronic  fracture. Bone marrow signal intensity within normal limits. No  discrete or worrisome osseous lesions. Mild reactive marrow edema  noted about the right greater than left L4-5 facets due to facet  arthritis. No other abnormal marrow edema.     Conus medullaris and cauda equina: Conus extends to the L1 level.  Conus and cauda equina appear normal.     Paraspinal and other soft tissues: Unremarkable.     Disc levels:     L1-2:  Unremarkable.     L2-3:  Unremarkable.     L3-4: Normal interspace. Minimal facet spurring. No canal or  foraminal stenosis. No impingement.     L4-5: Disc desiccation. Shallow broad-based left subarticular disc  protrusion indents the left ventral thecal sac, encroaching upon the  left lateral recess. Associated central annular fissure. Moderate  bilateral facet hypertrophy, mildly progressed from previous.  Resultant mild canal with moderate left lateral recess stenosis,  descending L5 nerve root level. Mild  to moderate bilateral L4  foraminal narrowing. Appearance is overall relatively similar to  previous.     L5-S1: Normal interspace. Moderate right with mild left facet  hypertrophy, mildly progressed from previous. No stenosis or  impingement.     IMPRESSION:  1. Shallow left subarticular disc protrusion at L4-5 with resultant  mild canal and moderate left lateral recess stenosis, potentially  affecting the descending left L5 nerve root.  2. Moderate bilateral facet hypertrophy at L4-5 and L5-S1, mildly  progressed from previous, and could contribute to lower back pain.        Electronically Signed    By: Morene Hoard M.D.    On: 01/27/2020 01:46  Imaging: No results found.   PMFS History: Patient Active Problem List   Diagnosis Date Noted   Chronic left ear pain 10/14/2023   De Quervain's tenosynovitis, left 08/31/2023   Arthritis of left acromioclavicular joint 03/27/2023   Osteoarthritis of right knee 03/16/2023   GERD (gastroesophageal reflux disease) 03/31/2021   History of colonic polyps 11/21/2020   Chronic left shoulder pain 11/20/2020   Hyperlipidemia 04/23/2020  Class 2 severe obesity due to excess calories with serious comorbidity and body mass index (BMI) of 37.0 to 37.9 in adult [facility]) 02/13/2019   Sleep apnea 01/23/2019   Chronic diastolic heart failure (HCC) 11/07/2016   OSA (obstructive sleep apnea) 04/18/2012   Left ventricular diastolic dysfunction, NYHA class 1/moderate LVH with concentric hypertrophy 09/03/2011   Dyslipidemia associated with type 2 diabetes mellitus (HCC) 09/02/2011   Hypertension associated with diabetes (HCC) 09/02/2011   Past Medical History:  Diagnosis Date   Allergy    Tree/pollen   Arthritis    Carpal tunnel syndrome    Chronic migraine 01/23/2019   Diabetes mellitus    Diastolic heart failure (HCC)    DVT (deep venous thrombosis) (HCC)    left calf   GERD (gastroesophageal reflux disease)    Hypertension     Obesity    Pulmonary embolism (HCC) 2013   He has a history of HTN.  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 A repeat scan done in 2017 did not demonstrate evidence of emboli. Neither study demonstrated any coronary calcifications.    Pulmonary embolism (HCC)    Shoulder pain, bilateral    Sleep apnea    was fitted for mask and never got machine - too costly    Family History  Problem Relation Age of Onset   Hypertension Mother    Arthritis Mother    Kidney disease Father    Diabetes Father    Hypertension Other    Hyperlipidemia Other    Diabetes Other     Past Surgical History:  Procedure Laterality Date   CARPAL TUNNEL RELEASE Right 12/04/2015   Procedure: RIGHT CARPAL TUNNEL RELEASE;  Surgeon: Franky Curia, MD;  Location: [facility];  Service: Orthopedics;  Laterality: Right;  RIGHT CARPAL TUNNEL RELEASE   CARPAL TUNNEL RELEASE Right 12/2015   [facility]   CARPAL TUNNEL RELEASE Left 04/01/2016   Procedure: LEFT CARPAL TUNNEL RELEASE;  Surgeon: Franky Curia, MD;  Location: [facility];  Service: Orthopedics;  Laterality: Left;   I & D EXTREMITY Left 02/19/2021   Procedure: LEFT FOOT SPUR REMOVAL;  Surgeon: Addie Cordella Hamilton, MD;  Location: [facility] OR;  Service: Orthopedics;  Laterality: Left;   KNEE ARTHROSCOPY WITH MEDIAL MENISECTOMY Left 02/19/2021   Procedure: LEFT KNEE ARTHROSCOPY, MENISCAL CYST DECOMPRESSION;  Surgeon: Addie Cordella Hamilton, MD;  Location: [facility] OR;  Service: Orthopedics;  Laterality: Left;   right hand surgery     ROTATOR CUFF REPAIR Right    Social History   Occupational History   Occupation: real estate agent  Tobacco Use   Smoking status: Never   Smokeless tobacco: Never  Vaping Use   Vaping status: Never Used  Substance and Sexual Activity   Alcohol use: No    Alcohol/week: 0.0 standard drinks of alcohol   Drug use: No   Sexual activity: Yes

## 2023-12-23 ENCOUNTER — Ambulatory Visit: Admitting: Family Medicine

## 2023-12-23 ENCOUNTER — Encounter: Payer: Self-pay | Admitting: Family Medicine

## 2023-12-23 VITALS — BP 140/88 | HR 98 | Temp 97.9°F | Ht 66.0 in | Wt 226.8 lb

## 2023-12-23 DIAGNOSIS — R0982 Postnasal drip: Secondary | ICD-10-CM | POA: Diagnosis not present

## 2023-12-23 DIAGNOSIS — J029 Acute pharyngitis, unspecified: Secondary | ICD-10-CM | POA: Diagnosis not present

## 2023-12-23 DIAGNOSIS — L819 Disorder of pigmentation, unspecified: Secondary | ICD-10-CM | POA: Diagnosis not present

## 2023-12-23 MED ORDER — LEVOCETIRIZINE DIHYDROCHLORIDE 5 MG PO TABS: 5.0000 mg | ORAL_TABLET | Freq: Every evening | ORAL | 1 refills | Status: AC

## 2023-12-23 NOTE — Progress Notes (Signed)
 Acute Office Visit  Subjective:     Patient ID: Scott [name], male    DOB: 05-13-1970, 53 y.o.   MRN: [id number]  No chief complaint on file.   HPI  Discussed the use of AI scribe software for clinical note transcription with the patient, who gave verbal consent to proceed.  History of Present Illness Scott [name] is a 53 year old male who presents with a persistent raspy voice and drainage following a recent illness.  Upper respiratory symptoms - Raspy voice and postnasal drainage for 1 week and 5 days - Initial illness was severe with headache, body aches, and chills - No temperature measured during illness - Family members also ill; daughter diagnosed with sinus infection - Using Nyquil and Mucinex for symptom management - Requires refill of previously prescribed Zyrtec  Cutaneous lesion - New spot on hand appeared approximately 1 month ago after yard work, despite glove use - Concern for possible vitiligo due to family history in son     ROS Per HPI      Objective:    BP (!) 140/88 (BP Location: Left Arm, Patient Position: Sitting)   Pulse 98   Temp 97.9 F (36.6 C) (Temporal)   Ht 5' 6 (1.676 m)   Wt 226 lb 12.8 oz (102.9 kg)   SpO2 96%   BMI 36.61 kg/m    Physical Exam Vitals and nursing note reviewed.  Constitutional:      General: He is not in acute distress.    Appearance: Normal appearance.  HENT:     Head: Normocephalic and atraumatic.     Right Ear: External ear normal.     Left Ear: External ear normal.     Nose: Nose normal.     Mouth/Throat:     Mouth: Mucous membranes are moist.     Pharynx: Oropharynx is clear.     Comments: Oropharyngeal cobblestoning   Eyes:     Extraocular Movements: Extraocular movements intact.  Cardiovascular:     Rate and Rhythm: Normal rate and regular rhythm.     Pulses: Normal pulses.     Heart sounds: Normal heart sounds.  Pulmonary:     Effort: Pulmonary effort is normal. No  respiratory distress.     Breath sounds: Normal breath sounds. No wheezing, rhonchi or rales.  Musculoskeletal:        General: Normal range of motion.     Cervical back: Normal range of motion.     Right lower leg: No edema.     Left lower leg: No edema.  Lymphadenopathy:     Cervical: No cervical adenopathy.  Skin:    General: Skin is warm and dry.     Comments: See photo of L wrist  Neurological:     General: No focal deficit present.     Mental Status: He is alert and oriented to person, place, and time.  Psychiatric:        Mood and Affect: Mood normal.        Behavior: Behavior normal.     No results found for any visits on 12/23/23.      Assessment & Plan:   Assessment and Plan Assessment & Plan Acute pharyngitis with postnasal drip Acute pharyngitis with postnasal drip. No fever present. Cough may persist up to a month. - Refilled levocetirizine for postnasal drip. - Continue Nyquil and Mucinex as needed.  Disorder of pigmentation of hand, unspecified Unspecified pigmentation disorder on hand, possibly related  to outdoor activities. Family history of vitiligo noted. - Refer to dermatology for evaluation and management.     Orders Placed This Encounter  Procedures   Ambulatory referral to Dermatology    Referral Priority:   Routine    Referral Type:   Consultation    Referral Reason:   Specialty Services Required    Requested Specialty:   Dermatology    Number of Visits Requested:   1     Meds ordered this encounter  Medications   levocetirizine (XYZAL ) 5 MG tablet    Sig: Take 1 tablet (5 mg total) by mouth every evening.    Dispense:  90 tablet    Refill:  1    Return if symptoms worsen or fail to improve.  Corean LITTIE Ku, FNP

## 2023-12-23 NOTE — Patient Instructions (Signed)
 Refilled levocetirizine  We have placed a referral for you today.  Someone will be reaching out to get you scheduled.  Follow-up with me for new or worsening symptoms.

## 2023-12-27 ENCOUNTER — Other Ambulatory Visit: Payer: Self-pay

## 2023-12-27 ENCOUNTER — Telehealth: Payer: Self-pay

## 2023-12-27 DIAGNOSIS — L819 Disorder of pigmentation, unspecified: Secondary | ICD-10-CM

## 2023-12-27 NOTE — Telephone Encounter (Signed)
 Copied from CRM #[id number]. Topic: Referral - Question >> Dec 27, 2023 10:54 AM Laymon HERO wrote: Reason for CRM: Patient spoke to dermatology and they can not see him until April 2026, looking to get a referral for another doctor

## 2023-12-27 NOTE — Telephone Encounter (Signed)
 Spoke with patient, asked if he would mind driving to [place] he is fine with that. New derm referral placed, spoke with [facility] they are booking out about 1 month

## 2024-01-09 ENCOUNTER — Ambulatory Visit (INDEPENDENT_AMBULATORY_CARE_PROVIDER_SITE_OTHER)

## 2024-01-09 DIAGNOSIS — R21 Rash and other nonspecific skin eruption: Secondary | ICD-10-CM

## 2024-01-09 MED ORDER — TACROLIMUS 0.1 % EX OINT: TOPICAL_OINTMENT | CUTANEOUS | 5 refills | Status: AC

## 2024-01-09 NOTE — Patient Instructions (Addendum)
 Vitiligo - Patient Information  WHAT IS IT? - Vitiligo is a skin condition where the immune system attacks pigment-producing cells (melanocytes). - This causes white patches of skin that can slowly spread over time. - It is not contagious or dangerous, but it can affect appearance and self-esteem.  WHAT DOES IT LOOK LIKE? - Smooth white or light-colored patches of skin. - Often appears on the face, hands, arms, legs, or around body openings (eyes, mouth, genitals). - Hair in affected areas may also turn white.  CAUSES: - Exact cause is not fully known, but it is an autoimmune condition. - Family history may increase risk. - Sometimes associated with other autoimmune conditions (such as thyroid disease).  DIAGNOSIS: - Usually based on the appearance of the skin. - A special light (Wood's lamp) can highlight affected areas. - Blood tests may be ordered to look for other autoimmune conditions.  TREATMENT: - No single cure, but treatments can help restore pigment or limit spread. - Topical creams (corticosteroids, calcineurin inhibitors). GLENWOOD Mallory therapy (narrow-band UVB phototherapy). - Sometimes oral medications or newer biologic treatments. - Cosmetic options include makeup or skin dyes to cover patches.  OUTLOOK: - Vitiligo is long-lasting and may slowly progress. - Some patches may improve with treatment. - It does not affect overall health, but support for emotional well-being is important.  WHEN TO CALL YOUR DOCTOR: - If new white patches are spreading quickly. - If you notice hair color changes or loss of pigment in new areas. - If you have symptoms of other autoimmune conditions (such as fatigue, weight change, or thyroid)   Due to recent changes in healthcare laws, you may see results of your pathology and/or laboratory studies on MyChart before the doctors have had a chance to review them. We understand that in some cases there may be results that are confusing or  concerning to you. Please understand that not all results are received at the same time and often the doctors may need to interpret multiple results in order to provide you with the best plan of care or course of treatment. Therefore, we ask that you please give us  2 business days to thoroughly review all your results before contacting the office for clarification. Should we see a critical lab result, you will be contacted sooner.   If You Need Anything After Your Visit  If you have any questions or concerns for your doctor, please call our main line at [phone] and press option 4 to reach your doctor's medical assistant. If no one answers, please leave a voicemail as directed and we will return your call as soon as possible. Messages left after 4 pm will be answered the following business day.   You may also send us  a message via MyChart. We typically respond to MyChart messages within 1-2 business days.  For prescription refills, please ask your pharmacy to contact our office. Our fax number is [phone].  If you have an urgent issue when the clinic is closed that cannot wait until the next business day, you can page your doctor at the number below.    Please note that while we do our best to be available for urgent issues outside of office hours, we are not available 24/7.   If you have an urgent issue and are unable to reach us , you may choose to seek medical care at your doctor's office, retail clinic, urgent care center, or emergency room.  If you have a medical emergency, please immediately call  911 or go to the emergency department.  Pager Numbers  - Dr. Hester: [phone]  - Dr. Jackquline: [phone]  - Dr. Claudene: [phone]   - Dr. Raymund: [phone]  In the event of inclement weather, please call our main line at [phone] for an update on the status of any delays or closures.  Dermatology Medication Tips: Please keep the boxes that topical medications come  in in order to help keep track of the instructions about where and how to use these. Pharmacies typically print the medication instructions only on the boxes and not directly on the medication tubes.   If your medication is too expensive, please contact our office at [phone] option 4 or send us  a message through MyChart.   We are unable to tell what your co-pay for medications will be in advance as this is different depending on your insurance coverage. However, we may be able to find a substitute medication at lower cost or fill out paperwork to get insurance to cover a needed medication.   If a prior authorization is required to get your medication covered by your insurance company, please allow us  1-2 business days to complete this process.  Drug prices often vary depending on where the prescription is filled and some pharmacies may offer cheaper prices.  The website www.goodrx.com contains coupons for medications through different pharmacies. The prices here do not account for what the cost may be with help from insurance (it may be cheaper with your insurance), but the website can give you the price if you did not use any insurance.  - You can print the associated coupon and take it with your prescription to the pharmacy.  - You may also stop by our office during regular business hours and pick up a GoodRx coupon card.  - If you need your prescription sent electronically to a different pharmacy, notify our office through [facility] or by phone at [phone] option 4.     Si Usted Necesita Algo Despus de Su Visita  Tambin puede enviarnos un mensaje a travs de Clinical cytogeneticist. Por lo general respondemos a los mensajes de MyChart en el transcurso de 1 a 2 das hbiles.  Para renovar recetas, por favor pida a su farmacia que se ponga en contacto con nuestra oficina. Randi lakes de fax es [place] [phone].  Si tiene un asunto urgente cuando la clnica est cerrada y que no puede  esperar hasta el siguiente da hbil, puede llamar/localizar a su doctor(a) al nmero que aparece a continuacin.   Por favor, tenga en cuenta que aunque hacemos todo lo posible para estar disponibles para asuntos urgentes fuera del horario de [place], no estamos disponibles las 24 horas del da, los 7 [address]  [address] de la [place].   Si tiene un problema urgente y no puede comunicarse con nosotros, puede optar por buscar atencin mdica  en el consultorio de su doctor(a), en una clnica privada, en un centro de atencin urgente o en una sala de emergencias.  Si tiene Engineer, drilling, por favor llame inmediatamente al 911 o vaya a la sala de emergencias.  Nmeros de bper  - Dr. Hester: [phone]  - Dra. Jackquline: [phone]  - Dr. Claudene: [phone]  - Dra. Kitts: [phone]  En caso de inclemencias del [place], por favor llame a nuestra lnea principal al [phone] para una actualizacin sobre el estado de cualquier retraso o cierre.  Consejos para la medicacin en dermatologa: Por favor, guarde las cajas en las que vienen los  medicamentos de uso tpico para ayudarle a seguir las [employer] dnde y cmo usarlos. Las farmacias generalmente imprimen las instrucciones del medicamento slo en las cajas y no directamente en los tubos del [place].   Si su medicamento es muy caro, por favor, pngase en contacto con landry rieger llamando al [phone] y presione la opcin 4 o envenos un mensaje a travs de Clinical cytogeneticist.   No podemos decirle cul ser su copago por los medicamentos por adelantado ya que esto es diferente dependiendo de la cobertura de su seguro. Sin embargo, es posible que podamos encontrar un medicamento sustituto a Audiological scientist un formulario para que el seguro cubra el medicamento que se considera necesario.   Si se requiere una autorizacin previa para que su compaa de seguros malta su medicamento, por favor permtanos de 1 a 2 das hbiles para  completar este proceso.  Los precios de los medicamentos varan con frecuencia dependiendo del Environmental consultant de dnde se surte la receta y alguna farmacias pueden ofrecer precios ms baratos.  El sitio web www.goodrx.com tiene cupones para medicamentos de Health and safety inspector. Los precios aqu no tienen en cuenta lo que podra costar con la ayuda del seguro (puede ser ms barato con su seguro), pero el sitio web puede darle el precio si no utiliz Tourist information centre manager.  - Puede imprimir el cupn correspondiente y llevarlo con su receta a la farmacia.  - Tambin puede pasar por nuestra oficina durante el horario de atencin regular y Education officer, museum una tarjeta de cupones de GoodRx.  - Si necesita que su receta se enve electrnicamente a una farmacia diferente, informe a nuestra oficina a travs de MyChart de No Name o por telfono llamando al [phone] y presione la opcin 4.

## 2024-01-09 NOTE — Progress Notes (Signed)
   Follow-Up Visit   Subjective  Scott [name] is a 53 y.o. male who presents for the following: Patient reports areas of skin lightening on his left wrist. Denies any triggers. Thinks may have started after using weed killer outside. No light spots elsewhere. Son with history of vitiligo. No other autoimmune conditions. Does wear watch on that side but does not rub.   The following portions of the chart were reviewed this encounter and updated as appropriate: medications, allergies, medical history  Review of Systems:  No other skin or systemic complaints except as noted in HPI or Assessment and Plan.  Objective  Well appearing patient in no apparent distress; mood and affect are within normal limits.  A focused examination was performed of the following areas: Wrists bilaterally  L wrist with poorly demarcated hypopigmented patch on wood's lamp     Relevant exam findings are noted in the Assessment and Plan.    Assessment & Plan    Rash of uncertain etiology, ddx post inflammatory hypopigmentation vs developing vitiligo vs other  - Undiagnosed new problem with uncertain prognosis  - Differential diagnosis, treatment options, prognosis, risk/ benefit, and side effects of treatment were discussed with the patient.  - suspect external cause/post inflammatory hypopigmentation given isolated, poorly demarcated lesion. Advised can trial anti inflammatory given family hx of vitiligo.  - will trial tacrolimus  0.% ointment BID  Educated about black box warning (and also that recent studies show no increased malignancy risk) and common adverse effects such as burning with application (advised to cool in fridge and only apply to bone-dry skin). - discussed need for biopsy if no improvement at his follow up       Return in about 6 weeks (around 02/20/2024) for Hypopigminatation .  I, Emerick Ege, CMA am acting as scribe for Lauraine JAYSON Kanaris, MD.  Documentation: I have reviewed the  above documentation for accuracy and completeness, and I agree with the above.  Lauraine JAYSON Kanaris, MD

## 2024-01-18 ENCOUNTER — Telehealth: Payer: Self-pay

## 2024-01-18 NOTE — Telephone Encounter (Signed)
 Patient called stating Tacrolimus  Ointment not helping.  Discussed issue with son and his dermatologist gave him samples of Opzelura  which helped.  Patient is asking can we send in RX for him?

## 2024-01-19 MED ORDER — OPZELURA 1.5 % EX CREA: 1.0000 | TOPICAL_CREAM | Freq: Two times a day (BID) | CUTANEOUS | 1 refills | Status: DC

## 2024-01-19 NOTE — Telephone Encounter (Signed)
 RX sent in and left detailed voicemail. aw

## 2024-01-24 ENCOUNTER — Emergency Department (HOSPITAL_BASED_OUTPATIENT_CLINIC_OR_DEPARTMENT_OTHER)
Admission: EM | Admit: 2024-01-24 | Discharge: 2024-01-24 | Disposition: A | Discharge status: Home / Self Care | Attending: Emergency Medicine | Admitting: Emergency Medicine

## 2024-01-24 ENCOUNTER — Emergency Department (HOSPITAL_BASED_OUTPATIENT_CLINIC_OR_DEPARTMENT_OTHER): Admitting: Radiology

## 2024-01-24 ENCOUNTER — Other Ambulatory Visit: Payer: Self-pay

## 2024-01-24 ENCOUNTER — Emergency Department (HOSPITAL_BASED_OUTPATIENT_CLINIC_OR_DEPARTMENT_OTHER)

## 2024-01-24 DIAGNOSIS — E119 Type 2 diabetes mellitus without complications: Secondary | ICD-10-CM | POA: Insufficient documentation

## 2024-01-24 DIAGNOSIS — Z7982 Long term (current) use of aspirin: Secondary | ICD-10-CM | POA: Diagnosis not present

## 2024-01-24 DIAGNOSIS — R0689 Other abnormalities of breathing: Secondary | ICD-10-CM | POA: Insufficient documentation

## 2024-01-24 DIAGNOSIS — Z794 Long term (current) use of insulin: Secondary | ICD-10-CM | POA: Insufficient documentation

## 2024-01-24 DIAGNOSIS — Z7984 Long term (current) use of oral hypoglycemic drugs: Secondary | ICD-10-CM | POA: Diagnosis not present

## 2024-01-24 DIAGNOSIS — Z79899 Other long term (current) drug therapy: Secondary | ICD-10-CM | POA: Insufficient documentation

## 2024-01-24 DIAGNOSIS — R079 Chest pain, unspecified: Secondary | ICD-10-CM | POA: Insufficient documentation

## 2024-01-24 DIAGNOSIS — I10 Essential (primary) hypertension: Secondary | ICD-10-CM | POA: Diagnosis not present

## 2024-01-24 LAB — COMPREHENSIVE METABOLIC PANEL WITH GFR
ALT: 52 U/L — ABNORMAL HIGH (ref 0–44)
AST: 34 U/L (ref 15–41)
Albumin: 4.5 g/dL (ref 3.5–5.0)
Alkaline Phosphatase: 59 U/L (ref 38–126)
Anion gap: 13 (ref 5–15)
BUN: 14 mg/dL (ref 6–20)
CO2: 22 mmol/L (ref 22–32)
Calcium: 9.2 mg/dL (ref 8.9–10.3)
Chloride: 106 mmol/L (ref 98–111)
Creatinine, Ser: 1.09 mg/dL (ref 0.61–1.24)
GFR, Estimated: >60 mL/min (ref >60)
Glucose, Bld: 175 mg/dL — ABNORMAL HIGH (ref 70–99)
Potassium: 4.2 mmol/L (ref 3.5–5.1)
Sodium: 141 mmol/L (ref 135–145)
Total Bilirubin: 0.7 mg/dL (ref 0.0–1.2)
Total Protein: 7.2 g/dL (ref 6.5–8.1)

## 2024-01-24 LAB — CBC WITH DIFFERENTIAL/PLATELET
Abs Immature Granulocytes: 0.01 K/uL (ref 0.00–0.07)
Basophils Absolute: 0 K/uL (ref 0.0–0.1)
Basophils Relative: 0 %
Eosinophils Absolute: 0.1 K/uL (ref 0.0–0.5)
Eosinophils Relative: 2 %
HCT: 49 % (ref 39.0–52.0)
Hemoglobin: 15.9 g/dL (ref 13.0–17.0)
Immature Granulocytes: 0 %
Lymphocytes Relative: 49 %
Lymphs Abs: 2.5 K/uL (ref 0.7–4.0)
MCH: 25.6 pg — ABNORMAL LOW (ref 26.0–34.0)
MCHC: 32.4 g/dL (ref 30.0–36.0)
MCV: 79 fL — ABNORMAL LOW (ref 80.0–100.0)
Monocytes Absolute: 0.6 K/uL (ref 0.1–1.0)
Monocytes Relative: 12 %
Neutro Abs: 1.9 K/uL (ref 1.7–7.7)
Neutrophils Relative %: 37 %
Platelets: 212 K/uL (ref 150–400)
RBC: 6.2 MIL/uL — ABNORMAL HIGH (ref 4.22–5.81)
RDW: 15.6 % — ABNORMAL HIGH (ref 11.5–15.5)
WBC: 5 K/uL (ref 4.0–10.5)
nRBC: 0 % (ref 0.0–0.2)

## 2024-01-24 LAB — TROPONIN T, HIGH SENSITIVITY
Troponin T High Sensitivity: <15 ng/L (ref 0–19)
Troponin T High Sensitivity: <15 ng/L (ref 0–19)

## 2024-01-24 LAB — PRO BRAIN NATRIURETIC PEPTIDE: Pro Brain Natriuretic Peptide: <50 pg/mL (ref <300.0)

## 2024-01-24 MED ORDER — IOHEXOL 350 MG/ML SOLN
75.0000 mL | Freq: Once | INTRAVENOUS | Status: AC | PRN
  Administered 2024-01-24: 75 mL via INTRAVENOUS

## 2024-01-24 NOTE — ED Provider Notes (Signed)
 [place] EMERGENCY DEPARTMENT AT [facility] Provider Note   CSN: [id number] Arrival date & time: 01/24/24  8095     Patient presents with: No chief complaint on file.   Scott [name] is a 53 y.o. male.   Patient here with chest pain earlier today after taking a pill.  Felt little bit of discomfort eating and drinking.  Mild pressure throughout the day but mostly resolved now.  History of diabetes hypertension blood clot.  Denies any cough sputum production weakness numbness tingling.  Denies any fever or chills.  No history of CAD.  No longer on anticoagulation.  No significant family history.  The history is provided by the patient.       Prior to Admission medications   Medication Sig Start Date End Date Taking? Authorizing Provider  Aloe Vera OIL by Does not apply route. Blue stop- Emu oil, Aloe oil, Coconut oil    [provider]  amLODipine  (NORVASC ) 10 MG tablet Take 1 tablet (10 mg total) by mouth daily. 03/02/23   Purcell Emil Schanz, MD  aspirin  81 MG chewable tablet Chew by mouth daily.    [provider]  atorvastatin  (LIPITOR) 20 MG tablet Take 1 tablet (20 mg total) by mouth daily. 09/13/22 12/23/23  Purcell Emil Schanz, MD  dimenhyDRINATE  (DRAMAMINE) 50 MG tablet Take 1 tablet (50 mg total) by mouth every 8 (eight) hours as needed. 09/22/23   Purcell Emil Schanz, MD  fluticasone  (FLONASE ) 50 MCG/ACT nasal spray Place 2 sprays into both nostrils daily. Patient taking differently: Place 2 sprays into both nostrils daily as needed for allergies. 10/08/20   Tonette Lauraine HERO, PA-C  glucose blood test strip Use as instructed. Inject into the skin once daily. E11.65 11/05/20   Mayers, Cari S, PA-C  HYDROcodone -acetaminophen  (NORCO/VICODIN) 5-325 MG tablet Take 1 tablet by mouth every 8 (eight) hours as needed for moderate pain (pain score 4-6). 04/14/23 04/13/24  Addie Cordella Hamilton, MD  JARDIANCE  25 MG TABS tablet Take 1 tablet (25 mg total) by  mouth daily. 03/02/23   Purcell Emil Schanz, MD  levocetirizine (XYZAL ) 5 MG tablet Take 1 tablet (5 mg total) by mouth every evening. 12/23/23   Alvia Corean CROME, FNP  lisinopril  (ZESTRIL ) 20 MG tablet Take 1 tablet (20 mg total) by mouth daily. 03/02/23 02/25/24  Purcell Emil Schanz, MD  loratadine  (CLARITIN ) 10 MG tablet Take 10 mg by mouth daily as needed for allergies.    [provider]  metFORMIN  (GLUCOPHAGE ) 500 MG tablet TAKE 1 TABLET BY MOUTH TWICE A DAY WITH A MEAL 02/13/23   Sagardia, Emil Schanz, MD  methocarbamol  (ROBAXIN ) 500 MG tablet Take 1 tablet (500 mg total) by mouth every 8 (eight) hours as needed for muscle spasms. 03/17/23   Magnant, Charles L, PA-C  OZEMPIC , 2 MG/DOSE, 8 MG/3ML SOPN DIAL AND INJECT UNDER THE SKIN 2 MG WEEKLY 09/15/23   Sagardia, Miguel Jose, MD  Ruxolitinib Phosphate  (OPZELURA ) 1.5 % CREA Apply 1 Application topically 2 (two) times daily. 01/19/24   Raymund Lauraine BROCKS, MD  scopolamine  (TRANSDERM SCOP , 1.5 MG,) 1 MG/3DAYS Place 1 patch (1.5 mg total) onto the skin every 3 (three) days. 09/23/23   Purcell Emil Schanz, MD  tacrolimus  (PROTOPIC ) 0.1 % ointment Apply 2 grams twice daily to affected areas of skin 01/09/24   Raymund Lauraine BROCKS, MD    Allergies: Diphenhydramine hcl and Pollen extract    Review of Systems  Updated Vital Signs BP (!) 142/91 (BP  Location: Right Arm)   Pulse 93   Temp 98.1 F (36.7 C) (Oral)   Resp 18   SpO2 96%   Physical Exam Vitals and nursing note reviewed.  Constitutional:      General: He is not in acute distress.    Appearance: He is well-developed. He is not ill-appearing.  HENT:     Head: Normocephalic and atraumatic.     Nose: Nose normal.     Mouth/Throat:     Mouth: Mucous membranes are moist.  Eyes:     Extraocular Movements: Extraocular movements intact.     Conjunctiva/sclera: Conjunctivae normal.     Pupils: Pupils are equal, round, and reactive to light.  Cardiovascular:     Rate and Rhythm:  Normal rate and regular rhythm.     Pulses: Normal pulses.     Heart sounds: Normal heart sounds. No murmur heard. Pulmonary:     Effort: Pulmonary effort is normal. No respiratory distress.     Breath sounds: Normal breath sounds.  Abdominal:     General: Abdomen is flat.     Palpations: Abdomen is soft.     Tenderness: There is no abdominal tenderness.  Musculoskeletal:        General: No swelling.     Cervical back: Normal range of motion and neck supple.  Skin:    General: Skin is warm and dry.     Capillary Refill: Capillary refill takes less than 2 seconds.  Neurological:     General: No focal deficit present.     Mental Status: He is alert.  Psychiatric:        Mood and Affect: Mood normal.     (all labs ordered are listed, but only abnormal results are displayed) Labs Reviewed  CBC WITH DIFFERENTIAL/PLATELET - Abnormal; Notable for the following components:      Result Value   RBC 6.20 (*)    MCV 79.0 (*)    MCH 25.6 (*)    RDW 15.6 (*)    All other components within normal limits  COMPREHENSIVE METABOLIC PANEL WITH GFR - Abnormal; Notable for the following components:   Glucose, Bld 175 (*)    ALT 52 (*)    All other components within normal limits  PRO BRAIN NATRIURETIC PEPTIDE  TROPONIN T, HIGH SENSITIVITY  TROPONIN T, HIGH SENSITIVITY    EKG: None  Radiology: CT Angio Chest PE W and/or Wo Contrast Result Date: 01/24/2024 EXAM: CTA CHEST 01/24/2024 08:33:50 PM TECHNIQUE: CTA of the chest was performed after the administration of intravenous contrast. The contrast used was iohexol  (OMNIPAQUE ) 350 MG/ML injection, 75 mL. Multiplanar reformatted images are provided for review. MIP images are provided for review. Automated exposure control, iterative reconstruction, and/or weight-based adjustment of the mA/kV was utilized to reduce the radiation dose to as low as reasonably achievable. COMPARISON: None available. CLINICAL HISTORY: Pulmonary embolism (PE)  suspected, high prob. CP earlier today after taking a pill. Still feels the pressure. FINDINGS: PULMONARY ARTERIES: Pulmonary arteries are adequately opacified for evaluation. No acute pulmonary embolus. Central pulmonary arteries are of normal caliber. MEDIASTINUM: The heart and pericardium demonstrate no acute abnormality. Global cardiac size is mildly enlarged. No pericardial effusion. LYMPH NODES: No mediastinal, hilar or axillary lymphadenopathy. LUNGS AND PLEURA: The lungs are without acute process. No focal consolidation or pulmonary edema. No evidence of pleural effusion or pneumothorax. UPPER ABDOMEN: Limited images of the upper abdomen are unremarkable. SOFT TISSUES AND BONES: No acute bone or soft tissue abnormality.  AORTA: No aortic aneurysm. IMPRESSION: 1. No pulmonary embolism. 2. Mildly enlarged global cardiac size. No pericardial effusion. Electronically signed by: Dorethia Molt MD 01/24/2024 08:42 PM EDT RP Workstation: [id number]   DG Chest 2 View Result Date: 01/24/2024 CLINICAL DATA:  Chest pain earlier today after taking a pill. EXAM: CHEST - 2 VIEW COMPARISON:  March 09, 2022 FINDINGS: The heart size and mediastinal contours are within normal limits. Both lungs are clear. No radiopaque foreign bodies are identified. A chronic appearing deformity is seen involving the distal right clavicle. The visualized skeletal structures are otherwise unremarkable. IMPRESSION: No active cardiopulmonary disease. Electronically Signed   By: Suzen Dials M.D.   On: 01/24/2024 19:37     Procedures   Medications Ordered in the ED  iohexol  (OMNIPAQUE ) 350 MG/ML injection 75 mL (75 mLs Intravenous Contrast Given 01/24/24 2025)                HEART Score: 3                    Medical Decision Making Amount and/or Complexity of Data Reviewed Labs: ordered. Radiology: ordered.  Risk Prescription drug management.   Scott [name] is here with chest pain.  Normal vitals.  No fever.   Multiple cardiac risk factors but heart score 3.  Troponin unremarkable x 2.  EKG shows sinus rhythm.  No ischemic changes.  No significant leukocytosis anemia or electrolyte abnormality otherwise.  He did have a history of PE and pursued a CT scan of the chest that was unremarkable.  No evidence of PE or pneumonia.  No electrolyte abnormalities no anemia.  Chest pain somewhat atypical.  Seems like may be related to eating.  We does have multiple cardiac risk factors and will have him follow-up with cardiology outpatient.  He is not been having any exertional symptoms.  Sounds like he had a negative stress test here recently.  Overall well-appearing.  Recommend follow-up with primary care doctor.  Told to return if symptoms worsen.  Discharge.  Unremarkable labs and vitals today.  Pain-free throughout my care.  This chart was dictated using voice recognition software.  Despite best efforts to proofread,  errors can occur which can change the documentation meaning.      Final diagnoses:  Nonspecific chest pain    ED Discharge Orders          Ordered    Ambulatory referral to Cardiology       Comments: If you have not heard from the Cardiology office within the next 72 hours please call [phone].   01/24/24 2134               Ruthe Cornet, DO 01/24/24 2136

## 2024-01-24 NOTE — Discharge Instructions (Signed)
Follow up with cardiology. Return if symptoms worsen.

## 2024-01-24 NOTE — ED Triage Notes (Signed)
 CP earlier today after taking a pill. Still feels the pressure.

## 2024-01-25 ENCOUNTER — Telehealth: Payer: Self-pay | Admitting: Orthopedic Surgery

## 2024-01-25 DIAGNOSIS — M19012 Primary osteoarthritis, left shoulder: Secondary | ICD-10-CM

## 2024-01-25 NOTE — Telephone Encounter (Signed)
 Pt called stating he is still having problems with his shoulder and did not make another office visit but is asking for a call from Addie sherlynn Primes with next plan of care. Please call pt at [phone].

## 2024-01-26 NOTE — Telephone Encounter (Signed)
 Shoulder ok for 2 - 3 days then pain recurred - pls schedule mri arthrogram left shoulder to eval resected ac joint and bursitis v rct yhx

## 2024-01-26 NOTE — Telephone Encounter (Signed)
MRI ordered. Called and advised pt

## 2024-02-14 ENCOUNTER — Inpatient Hospital Stay: Admission: RE | Admit: 2024-02-14 | Source: Ambulatory Visit

## 2024-02-20 ENCOUNTER — Ambulatory Visit

## 2024-02-20 DIAGNOSIS — L80 Vitiligo: Secondary | ICD-10-CM | POA: Diagnosis not present

## 2024-02-20 NOTE — Patient Instructions (Signed)

## 2024-02-20 NOTE — Progress Notes (Signed)
    Subjective   Scott [name] is a 53 y.o. male who presents for the following: Rash. Patient is established patient   Today patient reports: Patient reports some  improvement since last visit. Patient has been using Tacrolimus  ointment as prescribed. Alternates w/ opzelura    Review of Systems:    No other skin or systemic complaints except as noted in HPI or Assessment and Plan.  The following portions of the chart were reviewed this encounter and updated as appropriate: medications, allergies, medical history  Relevant Medical History:  n/a   Objective  Well appearing patient in no apparent distress; mood and affect are within normal limits. Examination was performed of the: Left wrist  Examination notable for: depigmented patch L wrist, some improvement from prior  Examination limited by: Clothing and Patient deferred removal            Assessment & Plan   Depigmented patch L wrist - favor vitiligo - some improvement since last visit but not at tx goal Chronic condition - not at goal  - Discussed diagnosis, typical course, and treatment options for this condition - Counseled patient on the autoimmune nature of this condition, unpredictability, and clinic courses - Advised that the patient cannot do anything to prevent new areas of depigmentation from forming - Counseled that skin trauma can cause depigmentation - Advised that if repigmentation is to occur, it will appear as small brown spots reappearing inside the confines of the lesion - Counseled that if disease is extensive or has been going on for a long period of time, that it is unlikely to achieve any regrowth - Continue Opzelura  1.5% cream apply twice daily to affected area  - 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 Condition is improving with treatment but not currently at  goal.   Procedures, orders, diagnosis for this visit:    There are no diagnoses linked to this encounter.  Return to clinic: Return 4-6 months, for Vitiligo .  I, Emerick Ege, CMA am acting as scribe for Lauraine JAYSON Kanaris, MD.   Documentation: I have reviewed the above documentation for accuracy and completeness, and I agree with the above.  Lauraine JAYSON Kanaris, MD

## 2024-02-28 ENCOUNTER — Other Ambulatory Visit: Payer: Self-pay | Admitting: Student

## 2024-02-28 ENCOUNTER — Ambulatory Visit
Admission: RE | Admit: 2024-02-28 | Discharge: 2024-02-28 | Disposition: A | Discharge status: Home / Self Care | Source: Ambulatory Visit | Attending: Orthopedic Surgery | Admitting: Orthopedic Surgery

## 2024-02-28 DIAGNOSIS — M19012 Primary osteoarthritis, left shoulder: Secondary | ICD-10-CM

## 2024-02-28 MED ORDER — IOPAMIDOL (ISOVUE-M 200) INJECTION 41%
10.0000 mL | Freq: Once | INTRAMUSCULAR | Status: AC
  Administered 2024-02-28: 10 mL via INTRA_ARTICULAR

## 2024-02-28 NOTE — Procedures (Signed)
 Radiology Procedure Note  Risks and benefits of joint injection were discussed with the patient including, but not limited to bleeding, infection, damage to adjacent structures, allergic reaction, and extraarticular contrast injection.    All of the patient's questions were answered, patient is agreeable to proceed. Consent signed and in chart.  A timeout was performed with all members of the team prior to start of the procedure. Correct patient and correct procedure was confirmed. Allergies were reviewed.   PROCEDURE SUMMARY:  Successful fluoro guided left shoulder arthrogram. No immediate complications.  Patient tolerated well.   EBL = trace  Please see full dictation in imaging section of Epic for procedure details.   Demarion Pondexter H Eri Mcevers PA-C 02/28/2024 3:41 PM

## 2024-02-29 ENCOUNTER — Other Ambulatory Visit: Payer: Self-pay | Admitting: Emergency Medicine

## 2024-02-29 DIAGNOSIS — I10 Essential (primary) hypertension: Secondary | ICD-10-CM

## 2024-03-01 ENCOUNTER — Telehealth: Payer: Self-pay

## 2024-03-01 ENCOUNTER — Ambulatory Visit: Admitting: Emergency Medicine

## 2024-03-01 ENCOUNTER — Telehealth: Payer: Self-pay | Admitting: Physical Medicine and Rehabilitation

## 2024-03-01 ENCOUNTER — Other Ambulatory Visit: Payer: Self-pay | Admitting: Physical Medicine and Rehabilitation

## 2024-03-01 ENCOUNTER — Ambulatory Visit: Admitting: Family Medicine

## 2024-03-01 DIAGNOSIS — M545 Low back pain, unspecified: Secondary | ICD-10-CM

## 2024-03-01 DIAGNOSIS — M47816 Spondylosis without myelopathy or radiculopathy, lumbar region: Secondary | ICD-10-CM

## 2024-03-01 DIAGNOSIS — M47817 Spondylosis without myelopathy or radiculopathy, lumbosacral region: Secondary | ICD-10-CM

## 2024-03-01 NOTE — Telephone Encounter (Signed)
 RFA--3/25 and 4/25 Patient requesting Lumbar Facet Joint Intra-Articular Injection .SABRALast 11/24 %85 relief/ Function ability Duration of relief / improvement --6 months Current pain score--8 Recent falls or injuries--None Same location and same pain Lumbar area

## 2024-03-01 NOTE — Telephone Encounter (Signed)
 Patient called and wants an appointment for the cortisone shot for the back. CB#[phone]

## 2024-03-01 NOTE — Progress Notes (Signed)
 Patient called into the office today requesting repeat facet joint injection. He reports 4-6 months of relief with prior RFA and facet joint injection, about the same amount of relief with both procedures. He would like to repeat steroid shot at this time. I placed order for repeat bilateral L4-L5 facet joint injection.

## 2024-03-02 ENCOUNTER — Ambulatory Visit: Admitting: Surgical

## 2024-03-05 ENCOUNTER — Ambulatory Visit: Admitting: Orthopedic Surgery

## 2024-03-05 ENCOUNTER — Encounter: Payer: Self-pay | Admitting: Radiology

## 2024-03-07 ENCOUNTER — Encounter: Payer: Self-pay | Admitting: Orthopedic Surgery

## 2024-03-07 ENCOUNTER — Ambulatory Visit: Admitting: Orthopedic Surgery

## 2024-03-07 DIAGNOSIS — M25561 Pain in right knee: Secondary | ICD-10-CM | POA: Diagnosis not present

## 2024-03-07 NOTE — Progress Notes (Signed)
 Office Visit Note   [name]           Date of Birth: [birth year]           MRN: [id number] Visit Date: 03/07/2024 Requested by: Alvia Corean CROME, FNP [address] [place],  [place] [address] PCP: Alvia Corean CROME, FNP  Subjective: Chief Complaint  Patient presents with   Left Shoulder - Follow-up    MRI review   Left Knee - Pain    HPI: Scott [name] is a 53 y.o. male who presents to the office reporting left shoulder and left knee pain.  Since he was last seen has had an MRI scan of the left shoulder.  That shows adequate decompression of the [facility] joint but recurrent spur growth on the superior aspect of the distal clavicle.  This has been the area where he has had pain.  He has had 2 injections with good but temporary relief.  Rest of the shoulder looks good in terms of rotator cuff and biceps tendon.  Hard for him to sleep on that left-hand side.  Most of his pain is superior.  He did have an ablation in his lumbar spine about 3 months ago.  He does have back pain does radiate down to the right knee.  Has a known history of right knee arthritis.  The back is about 65% in the right knee is about 35%.  He has had 3 knee injections so far this year.  They were cortisone injections..                ROS: All systems reviewed are negative as they relate to the chief complaint within the history of present illness.  Patient denies fevers or chills.  Assessment & Plan: Visit Diagnoses: No diagnosis found.  Plan: Impression is symptomatic superior left shoulder pain following overgrowth of spurring around that arthroscopically resect the distal end of the clavicle.  Plan at this time would be open osteophyte removal from the distal end of the clavicle with bone lacks placement to try to prevent further osteophyte formation.  We will also get him preapproved for gel injection in the right knee with 67-month return.  Risk and benefits of the surgery are  discussed with the patient include not limited to infection or vessel damage incomplete pain relief as well as shoulder stiffness.  Patient understands the risk and benefits and wishes to proceed.  All questions answered.  Jame This patient is diagnosed with osteoarthritis of the knee(s).    Radiographs show evidence of joint space narrowing, osteophytes, subchondral sclerosis and/or subchondral cysts.  This patient has knee pain which interferes with functional and activities of daily living.    This patient has experienced inadequate response, adverse effects and/or intolerance with conservative treatments such as acetaminophen , NSAIDS, topical creams, physical therapy or regular exercise, knee bracing and/or weight loss.   This patient has experienced inadequate response or has a contraindication to intra articular steroid injections for at least 3 months.   This patient is not scheduled to have a total knee replacement within 6 months of starting treatment with viscosupplementation.   Follow-Up Instructions: No follow-ups on file.   Orders:  No orders of the defined types were placed in this encounter.  No orders of the defined types were placed in this encounter.     Procedures: No procedures performed   Clinical Data: No additional findings.  Objective: Vital Signs: There were no vitals  taken for this visit.  Physical Exam:  Constitutional: Patient appears well-developed HEENT:  Head: Normocephalic Eyes:EOM are normal Neck: Normal range of motion Cardiovascular: Normal rate Pulmonary/chest: Effort normal Neurologic: Patient is alert Skin: Skin is warm Psychiatric: Patient has normal mood and affect  Ortho Exam: Ortho exam demonstrates no effusion in the left or right knee.  Has full range of motion in the right knee with medial joint line tenderness.  Collateral and cruciate ligaments are intact.  Collateral cruciate ligaments are stable.  Left shoulder is  examined.  He does have tenderness around that [facility] joint with good rotator cuff strength negative O'Brien's testing and no restriction asymmetrically of passive external rotation.  Range of motion is full and symmetric in both knees.  Specialty Comments:  MRI LUMBAR SPINE WITHOUT CONTRAST     TECHNIQUE:  Multiplanar, multisequence MR imaging of the lumbar spine was  performed. No intravenous contrast was administered.     COMPARISON:  Prior radiograph from 01/16/2020 and MRI from  02/05/2017.     FINDINGS:  Segmentation:  Standard.     Alignment:  Physiologic.  No listhesis.     Vertebrae: Vertebral body height maintained without acute or chronic  fracture. Bone marrow signal intensity within normal limits. No  discrete or worrisome osseous lesions. Mild reactive marrow edema  noted about the right greater than left L4-5 facets due to facet  arthritis. No other abnormal marrow edema.     Conus medullaris and cauda equina: Conus extends to the L1 level.  Conus and cauda equina appear normal.     Paraspinal and other soft tissues: Unremarkable.     Disc levels:     L1-2:  Unremarkable.     L2-3:  Unremarkable.     L3-4: Normal interspace. Minimal facet spurring. No canal or  foraminal stenosis. No impingement.     L4-5: Disc desiccation. Shallow broad-based left subarticular disc  protrusion indents the left ventral thecal sac, encroaching upon the  left lateral recess. Associated central annular fissure. Moderate  bilateral facet hypertrophy, mildly progressed from previous.  Resultant mild canal with moderate left lateral recess stenosis,  descending L5 nerve root level. Mild to moderate bilateral L4  foraminal narrowing. Appearance is overall relatively similar to  previous.     L5-S1: Normal interspace. Moderate right with mild left facet  hypertrophy, mildly progressed from previous. No stenosis or  impingement.     IMPRESSION:  1. Shallow left subarticular disc  protrusion at L4-5 with resultant  mild canal and moderate left lateral recess stenosis, potentially  affecting the descending left L5 nerve root.  2. Moderate bilateral facet hypertrophy at L4-5 and L5-S1, mildly  progressed from previous, and could contribute to lower back pain.        Electronically Signed    By: Morene Hoard M.D.    On: 01/27/2020 01:46  Imaging: No results found.   PMFS History: Patient Active Problem List   Diagnosis Date Noted   Hypopigmented skin lesion 12/23/2023   Post-nasal drip 12/23/2023   Sore throat 12/23/2023   Chronic left ear pain 10/14/2023   De Quervain's tenosynovitis, left 08/31/2023   Arthritis of left acromioclavicular joint 03/27/2023   Osteoarthritis of right knee 03/16/2023   GERD (gastroesophageal reflux disease) 03/31/2021   History of colonic polyps 11/21/2020   Chronic left shoulder pain 11/20/2020   Hyperlipidemia 04/23/2020   Class 2 severe obesity due to excess calories with serious comorbidity and body mass index (BMI) of  37.0 to 37.9 in adult 02/13/2019   Sleep apnea 01/23/2019   Chronic diastolic heart failure (HCC) 11/07/2016   OSA (obstructive sleep apnea) 04/18/2012   Left ventricular diastolic dysfunction, NYHA class 1/moderate LVH with concentric hypertrophy 09/03/2011   Dyslipidemia associated with type 2 diabetes mellitus (HCC) 09/02/2011   Hypertension associated with diabetes (HCC) 09/02/2011   Past Medical History:  Diagnosis Date   Allergy    Tree/pollen   Arthritis    Carpal tunnel syndrome    Chronic migraine 01/23/2019   Diabetes mellitus    Diastolic heart failure (HCC)    DVT (deep venous thrombosis) (HCC)    left calf   GERD (gastroesophageal reflux disease)    Hypertension    Obesity    Pulmonary embolism (HCC) 2013   He has a history of HTN.  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 A repeat scan done in 2017 did not demonstrate evidence of emboli.  Neither study demonstrated any coronary calcifications.    Pulmonary embolism (HCC)    Shoulder pain, bilateral    Sleep apnea    was fitted for mask and never got machine - too costly    Family History  Problem Relation Age of Onset   Hypertension Mother    Arthritis Mother    Kidney disease Father    Diabetes Father    Hypertension Other    Hyperlipidemia Other    Diabetes Other     Past Surgical History:  Procedure Laterality Date   CARPAL TUNNEL RELEASE Right 12/04/2015   Procedure: RIGHT CARPAL TUNNEL RELEASE;  Surgeon: Franky Curia, MD;  Location: [facility];  Service: Orthopedics;  Laterality: Right;  RIGHT CARPAL TUNNEL RELEASE   CARPAL TUNNEL RELEASE Right 12/2015   [facility]   CARPAL TUNNEL RELEASE Left 04/01/2016   Procedure: LEFT CARPAL TUNNEL RELEASE;  Surgeon: Franky Curia, MD;  Location: [facility];  Service: Orthopedics;  Laterality: Left;   I & D EXTREMITY Left 02/19/2021   Procedure: LEFT FOOT SPUR REMOVAL;  Surgeon: Addie Cordella Hamilton, MD;  Location: [facility] OR;  Service: Orthopedics;  Laterality: Left;   KNEE ARTHROSCOPY WITH MEDIAL MENISECTOMY Left 02/19/2021   Procedure: LEFT KNEE ARTHROSCOPY, MENISCAL CYST DECOMPRESSION;  Surgeon: Addie Cordella Hamilton, MD;  Location: [facility] OR;  Service: Orthopedics;  Laterality: Left;   right hand surgery     ROTATOR CUFF REPAIR Right    Social History   Occupational History   Occupation: real estate agent  Tobacco Use   Smoking status: Never   Smokeless tobacco: Never  Vaping Use   Vaping status: Never Used  Substance and Sexual Activity   Alcohol use: No    Alcohol/week: 0.0 standard drinks of alcohol   Drug use: No   Sexual activity: Yes

## 2024-03-08 ENCOUNTER — Telehealth: Payer: Self-pay | Admitting: Orthopedic Surgery

## 2024-03-08 NOTE — Telephone Encounter (Signed)
 Called patient to offer surgery date. No answer.  Left message on voicemail providing name and direct number for scheduling excision of a clavicle spur on left shoulder with Dr. Addie.

## 2024-03-12 ENCOUNTER — Ambulatory Visit: Admitting: Orthopedic Surgery

## 2024-03-12 DIAGNOSIS — L97421 Non-pressure chronic ulcer of left heel and midfoot limited to breakdown of skin: Secondary | ICD-10-CM | POA: Diagnosis not present

## 2024-03-13 NOTE — Progress Notes (Signed)
 Sent message, via epic in basket, requesting orders in epic from Careers adviser.

## 2024-03-14 ENCOUNTER — Encounter: Payer: Self-pay | Admitting: Orthopedic Surgery

## 2024-03-14 NOTE — Progress Notes (Signed)
 Office Visit Note   [name]           Date of Birth: Nov 15, 1970           MRN: [id number] Visit Date: 03/12/2024              Requested by: Alvia Corean CROME, FNP [address] [place],  [place] [address] PCP: Alvia Corean CROME, FNP  Chief Complaint  Patient presents with   Left Foot - Pain      HPI: Discussed the use of AI scribe software for clinical note transcription with the patient, who gave verbal consent to proceed.  History of Present Illness Scott [name] is a 53 year old male who presents with a recurrent hyperkeratotic lesion on the posterior aspect of the left heel.  He has a recurrent hyperkeratotic lesion on the posterior aspect of the left heel, which has been previously treated by paring the callus back to flat, healthy tissue.  He reports that there are no larger shoes available.     Assessment & Plan: Visit Diagnoses: No diagnosis found.  Plan: Assessment and Plan Assessment & Plan Recurrent hyperkeratotic lesion of left heel Recurrent hyperkeratotic lesion on the posterior left heel. No infection, redness, or cellulitis. Good pulses. Lesion reduced to flat, healthy tissue with a 10 blade. No surgical intervention needed, managed conservatively. - Trim callus as needed if lesion flares up. - Follow up as needed.      Follow-Up Instructions: No follow-ups on file.   Ortho Exam  Patient is alert, oriented, no adenopathy, well-dressed, normal affect, normal respiratory effort. Physical Exam CARDIOVASCULAR: Pulses are good. EXTREMITIES: Recurrent hyperkeratotic lesion on the posterior left heel. No redness, cellulitis, or signs of infection.  After informed consent a 10 blade knife was used to pare the callus back to flat healthy tissue.  The area of callus was 1 cm in diameter.      Imaging: No results found. No images are attached to the encounter.  Labs: Lab Results  Component Value  Date   HGBA1C 6.9 (A) 08/31/2023   HGBA1C 7.3 (A) 06/02/2023   HGBA1C 7.9 (H) 02/24/2023     Lab Results  Component Value Date   ALBUMIN 4.5 01/24/2024   ALBUMIN 4.5 10/14/2023   ALBUMIN 4.7 02/24/2023    No results found for: MG No results found for: VD25OH  No results found for: PREALBUMIN    Latest Ref Rng & Units 01/24/2024    7:35 PM 10/14/2023   12:06 PM 03/10/2023    3:36 PM  CBC EXTENDED  WBC 4.0 - 10.5 K/uL 5.0  4.9  4.9   RBC 4.22 - 5.81 MIL/uL 6.20  6.47  6.93   Hemoglobin 13.0 - 17.0 g/dL 84.0  83.7  82.3   HCT 39.0 - 52.0 % 49.0  50.2  55.8   Platelets 150 - 400 K/uL 212  252.0  250   NEUT# 1.7 - 7.7 K/uL 1.9  2.3    Lymph# 0.7 - 4.0 K/uL 2.5  2.0       There is no height or weight on file to calculate BMI.  Orders:  No orders of the defined types were placed in this encounter.  No orders of the defined types were placed in this encounter.    Procedures: No procedures performed  Clinical Data: No additional findings.  ROS:  All other systems negative, except as noted in the HPI. Review of Systems  Objective:  Vital Signs: There were no vitals taken for this visit.  Specialty Comments:  MRI LUMBAR SPINE WITHOUT CONTRAST     TECHNIQUE:  Multiplanar, multisequence MR imaging of the lumbar spine was  performed. No intravenous contrast was administered.     COMPARISON:  Prior radiograph from 01/16/2020 and MRI from  02/05/2017.     FINDINGS:  Segmentation:  Standard.     Alignment:  Physiologic.  No listhesis.     Vertebrae: Vertebral body height maintained without acute or chronic  fracture. Bone marrow signal intensity within normal limits. No  discrete or worrisome osseous lesions. Mild reactive marrow edema  noted about the right greater than left L4-5 facets due to facet  arthritis. No other abnormal marrow edema.     Conus medullaris and cauda equina: Conus extends to the L1 level.  Conus and cauda equina appear normal.      Paraspinal and other soft tissues: Unremarkable.     Disc levels:     L1-2:  Unremarkable.     L2-3:  Unremarkable.     L3-4: Normal interspace. Minimal facet spurring. No canal or  foraminal stenosis. No impingement.     L4-5: Disc desiccation. Shallow broad-based left subarticular disc  protrusion indents the left ventral thecal sac, encroaching upon the  left lateral recess. Associated central annular fissure. Moderate  bilateral facet hypertrophy, mildly progressed from previous.  Resultant mild canal with moderate left lateral recess stenosis,  descending L5 nerve root level. Mild to moderate bilateral L4  foraminal narrowing. Appearance is overall relatively similar to  previous.     L5-S1: Normal interspace. Moderate right with mild left facet  hypertrophy, mildly progressed from previous. No stenosis or  impingement.     IMPRESSION:  1. Shallow left subarticular disc protrusion at L4-5 with resultant  mild canal and moderate left lateral recess stenosis, potentially  affecting the descending left L5 nerve root.  2. Moderate bilateral facet hypertrophy at L4-5 and L5-S1, mildly  progressed from previous, and could contribute to lower back pain.        Electronically Signed    By: Morene Hoard M.D.    On: 01/27/2020 01:46  PMFS History: Patient Active Problem List   Diagnosis Date Noted   Hypopigmented skin lesion 12/23/2023   Post-nasal drip 12/23/2023   Sore throat 12/23/2023   Chronic left ear pain 10/14/2023   De Quervain's tenosynovitis, left 08/31/2023   Arthritis of left acromioclavicular joint 03/27/2023   Osteoarthritis of right knee 03/16/2023   GERD (gastroesophageal reflux disease) 03/31/2021   History of colonic polyps 11/21/2020   Chronic left shoulder pain 11/20/2020   Hyperlipidemia 04/23/2020   Class 2 severe obesity due to excess calories with serious comorbidity and body mass index (BMI) of 37.0 to 37.9 in adult 02/13/2019   Sleep  apnea 01/23/2019   Chronic diastolic heart failure (HCC) 11/07/2016   OSA (obstructive sleep apnea) 04/18/2012   Left ventricular diastolic dysfunction, NYHA class 1/moderate LVH with concentric hypertrophy 09/03/2011   Dyslipidemia associated with type 2 diabetes mellitus (HCC) 09/02/2011   Hypertension associated with diabetes (HCC) 09/02/2011   Past Medical History:  Diagnosis Date   Allergy    Tree/pollen   Arthritis    Carpal tunnel syndrome    Chronic migraine 01/23/2019   Diabetes mellitus    Diastolic heart failure (HCC)    DVT (deep venous thrombosis) (HCC)    left calf   GERD (gastroesophageal reflux disease)    Hypertension  Obesity    Pulmonary embolism (HCC) 2013   He has a history of HTN.  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 A repeat scan done in 2017 did not demonstrate evidence of emboli. Neither study demonstrated any coronary calcifications.    Pulmonary embolism (HCC)    Shoulder pain, bilateral    Sleep apnea    was fitted for mask and never got machine - too costly    Family History  Problem Relation Age of Onset   Hypertension Mother    Arthritis Mother    Kidney disease Father    Diabetes Father    Hypertension Other    Hyperlipidemia Other    Diabetes Other     Past Surgical History:  Procedure Laterality Date   CARPAL TUNNEL RELEASE Right 12/04/2015   Procedure: RIGHT CARPAL TUNNEL RELEASE;  Surgeon: Franky Curia, MD;  Location: [facility];  Service: Orthopedics;  Laterality: Right;  RIGHT CARPAL TUNNEL RELEASE   CARPAL TUNNEL RELEASE Right 12/2015   [facility]   CARPAL TUNNEL RELEASE Left 04/01/2016   Procedure: LEFT CARPAL TUNNEL RELEASE;  Surgeon: Franky Curia, MD;  Location: [facility];  Service: Orthopedics;  Laterality: Left;   I & D EXTREMITY Left 02/19/2021   Procedure: LEFT FOOT SPUR REMOVAL;  Surgeon: Addie Cordella Hamilton, MD;  Location: [facility] OR;  Service: Orthopedics;   Laterality: Left;   KNEE ARTHROSCOPY WITH MEDIAL MENISECTOMY Left 02/19/2021   Procedure: LEFT KNEE ARTHROSCOPY, MENISCAL CYST DECOMPRESSION;  Surgeon: Addie Cordella Hamilton, MD;  Location: [facility] OR;  Service: Orthopedics;  Laterality: Left;   right hand surgery     ROTATOR CUFF REPAIR Right    Social History   Occupational History   Occupation: real estate agent  Tobacco Use   Smoking status: Never   Smokeless tobacco: Never  Vaping Use   Vaping status: Never Used  Substance and Sexual Activity   Alcohol use: No    Alcohol/week: 0.0 standard drinks of alcohol   Drug use: No   Sexual activity: Yes

## 2024-03-15 ENCOUNTER — Other Ambulatory Visit: Payer: Self-pay | Admitting: Emergency Medicine

## 2024-03-15 DIAGNOSIS — E1165 Type 2 diabetes mellitus with hyperglycemia: Secondary | ICD-10-CM

## 2024-03-15 DIAGNOSIS — E1159 Type 2 diabetes mellitus with other circulatory complications: Secondary | ICD-10-CM

## 2024-03-16 ENCOUNTER — Other Ambulatory Visit: Payer: Self-pay

## 2024-03-17 ENCOUNTER — Other Ambulatory Visit: Payer: Self-pay | Admitting: Emergency Medicine

## 2024-03-17 DIAGNOSIS — Z794 Long term (current) use of insulin: Secondary | ICD-10-CM

## 2024-03-19 NOTE — Progress Notes (Signed)
 Surgical Instructions   Your procedure is scheduled on Friday, November 21st. Report to [facility] Main Entrance A at 7:30 A.M., then check in with the Admitting office. Any questions or running late day of surgery: call [phone]  Questions prior to your surgery date: call [phone], Monday-Friday, 8am-4pm. If you experience any cold or flu symptoms such as cough, fever, chills, shortness of breath, etc. between now and your scheduled surgery, please notify us  at the above number.     Remember:  Do not eat after midnight the night before your surgery   You may drink clear liquids until 6:3 the morning of your surgery.   Clear liquids allowed are: Water, Non-Citrus Juices (without pulp), Carbonated Beverages, Clear Tea (no milk, honey, etc.), Black Coffee Only (NO MILK, CREAM OR POWDERED CREAMER of any kind), and Gatorade.    Take these medicines the morning of surgery with A SIP OF WATER  amLODipine  (NORVASC )  atorvastatin  (LIPITOR)   May take these medicines IF NEEDED: fluticasone  (FLONASE )   Follow your surgeon's instructions on when to stop Asprin.  If no instructions were given by your surgeon then you will need to call the office to get those instructions.     One week prior to surgery, STOP taking any Aleve , Naproxen , Ibuprofen , Motrin , Advil , Goody's, BC's, all herbal medications, fish oil, and non-prescription vitamins. This includes your celecoxib (CELEBREX).   WHAT DO I DO ABOUT MY DIABETES MEDICATION?   Do not take metFORMIN  (GLUCOPHAGE ) on the morning of surgery.  Per your physician's instruction, HOLD your JARDIANCE  for 72 hours prior to surgery. Last dose on 03-19-24.  As of today, HOLD your OZEMPIC , for 7 day's prior to surgery.   The day of surgery, do not take other diabetes injectables, including Byetta (exenatide), Bydureon (exenatide ER), Victoza (liraglutide), or Trulicity  (dulaglutide ).  If your CBG is greater than 220 mg/dL, you may take  of  your sliding scale (correction) dose of insulin .   HOW TO MANAGE YOUR DIABETES BEFORE AND AFTER SURGERY  Why is it important to control my blood sugar before and after surgery? Improving blood sugar levels before and after surgery helps healing and can limit problems. A way of improving blood sugar control is eating a healthy diet by:  Eating less sugar and carbohydrates  Increasing activity/exercise  Talking with your doctor about reaching your blood sugar goals High blood sugars (greater than 180 mg/dL) can raise your risk of infections and slow your recovery, so you will need to focus on controlling your diabetes during the weeks before surgery. Make sure that the doctor who takes care of your diabetes knows about your planned surgery including the date and location.  How do I manage my blood sugar before surgery? Check your blood sugar at least 4 times a day, starting 2 days before surgery, to make sure that the level is not too high or low.  Check your blood sugar the morning of your surgery when you wake up and every 2 hours until you get to the Short Stay unit.  If your blood sugar is less than 70 mg/dL, you will need to treat for low blood sugar: Do not take insulin . Treat a low blood sugar (less than 70 mg/dL) with  cup of clear juice (cranberry or apple), 4 glucose tablets, OR glucose gel. Recheck blood sugar in 15 minutes after treatment (to make sure it is greater than 70 mg/dL). If your blood sugar is not greater than 70 mg/dL on recheck,  call [phone] for further instructions. Report your blood sugar to the short stay nurse when you get to Short Stay.  If you are admitted to the hospital after surgery: Your blood sugar will be checked by the staff and you will probably be given insulin  after surgery (instead of oral diabetes medicines) to make sure you have good blood sugar levels. The goal for blood sugar control after surgery is 80-180 mg/dL.                       Do NOT Smoke (Tobacco/Vaping) for 24 hours prior to your procedure.  If you use a CPAP at night, you may bring your mask/headgear for your overnight stay.   You will be asked to remove any contacts, glasses, piercing's, hearing aid's, dentures/partials prior to surgery. Please bring cases for these items if needed.    Patients discharged the day of surgery will not be allowed to drive home, and someone needs to stay with them for 24 hours.  SURGICAL WAITING ROOM VISITATION Patients may have no more than 2 support people in the waiting area - these visitors may rotate.   Pre-op nurse will coordinate an appropriate time for 1 ADULT support person, who may not rotate, to accompany patient in pre-op.  Children under the age of 92 must have an adult with them who is not the patient and must remain in the main waiting area with an adult.  If the patient needs to stay at the hospital during part of their recovery, the visitor guidelines for inpatient rooms apply.  Please refer to the [facility] website for the visitor guidelines for any additional information.   If you received a COVID test during your pre-op visit  it is requested that you wear a mask when out in public, stay away from anyone that may not be feeling well and notify your surgeon if you develop symptoms. If you have been in contact with anyone that has tested positive in the last 10 days please notify you surgeon.      Pre-operative CHG Bathing Instructions   You can play a key role in reducing the risk of infection after surgery. Your skin needs to be as free of germs as possible. You can reduce the number of germs on your skin by washing with CHG (chlorhexidine  gluconate) soap before surgery. CHG is an antiseptic soap that kills germs and continues to kill germs even after washing.   DO NOT use if you have an allergy to chlorhexidine /CHG or antibacterial soaps. If your skin becomes reddened or irritated, stop using the CHG  and notify one of our RNs at [phone].              TAKE A SHOWER THE NIGHT BEFORE SURGERY   Please keep in mind the following:  DO NOT shave, including legs and underarms, 48 hours prior to surgery.   You may shave your face before/day of surgery.  Place clean sheets on your bed the night before surgery Use a clean washcloth (not used since being washed) for shower. DO NOT sleep with pet's night before surgery.  CHG Shower Instructions:  Wash your face and private area with normal soap. If you choose to wash your hair, wash first with your normal shampoo.  After you use shampoo/soap, rinse your hair and body thoroughly to remove shampoo/soap residue.  Turn the water OFF and apply half the bottle of CHG soap to a CLEAN washcloth.  Apply  CHG soap ONLY FROM YOUR NECK DOWN TO YOUR TOES (washing for 3-5 minutes)  DO NOT use CHG soap on face, private areas, open wounds, or sores.  Pay special attention to the area where your surgery is being performed.  If you are having back surgery, having someone wash your back for you may be helpful. Wait 2 minutes after CHG soap is applied, then you may rinse off the CHG soap.  Pat dry with a clean towel  Put on clean pajamas    Additional instructions for the day of surgery: If you choose, you may shower the morning of surgery with an antibacterial soap.  DO NOT APPLY any lotions, deodorants, cologne, or perfumes.   Do not wear jewelry or makeup Do not wear nail polish, gel polish, artificial nails, or any other type of covering on natural nails (fingers and toes) Do not bring valuables to the hospital. [facility] is not responsible for valuables/personal belongings. Put on clean/comfortable clothes.  Please brush your teeth.  Ask your nurse before applying any prescription medications to the skin.

## 2024-03-20 ENCOUNTER — Encounter (HOSPITAL_COMMUNITY)
Admission: RE | Admit: 2024-03-20 | Discharge: 2024-03-20 | Disposition: A | Discharge status: Home / Self Care | Source: Ambulatory Visit | Attending: Orthopedic Surgery | Admitting: Orthopedic Surgery

## 2024-03-20 ENCOUNTER — Other Ambulatory Visit: Payer: Self-pay

## 2024-03-20 ENCOUNTER — Encounter (HOSPITAL_COMMUNITY): Payer: Self-pay

## 2024-03-20 VITALS — BP 111/81 | HR 100 | Temp 97.8°F | Resp 17

## 2024-03-20 DIAGNOSIS — Z01818 Encounter for other preprocedural examination: Secondary | ICD-10-CM | POA: Diagnosis present

## 2024-03-20 DIAGNOSIS — Z01812 Encounter for preprocedural laboratory examination: Secondary | ICD-10-CM | POA: Insufficient documentation

## 2024-03-20 DIAGNOSIS — G4733 Obstructive sleep apnea (adult) (pediatric): Secondary | ICD-10-CM | POA: Diagnosis not present

## 2024-03-20 DIAGNOSIS — Z7985 Long-term (current) use of injectable non-insulin antidiabetic drugs: Secondary | ICD-10-CM | POA: Diagnosis not present

## 2024-03-20 DIAGNOSIS — E119 Type 2 diabetes mellitus without complications: Secondary | ICD-10-CM | POA: Insufficient documentation

## 2024-03-20 DIAGNOSIS — Z86711 Personal history of pulmonary embolism: Secondary | ICD-10-CM | POA: Insufficient documentation

## 2024-03-20 DIAGNOSIS — K219 Gastro-esophageal reflux disease without esophagitis: Secondary | ICD-10-CM | POA: Diagnosis not present

## 2024-03-20 DIAGNOSIS — I10 Essential (primary) hypertension: Secondary | ICD-10-CM | POA: Diagnosis not present

## 2024-03-20 LAB — BASIC METABOLIC PANEL WITH GFR
Anion gap: 9 (ref 5–15)
BUN: 18 mg/dL (ref 6–20)
CO2: 24 mmol/L (ref 22–32)
Calcium: 8.8 mg/dL — ABNORMAL LOW (ref 8.9–10.3)
Chloride: 107 mmol/L (ref 98–111)
Creatinine, Ser: 1.19 mg/dL (ref 0.61–1.24)
GFR, Estimated: >60 mL/min (ref >60)
Glucose, Bld: 194 mg/dL — ABNORMAL HIGH (ref 70–99)
Potassium: 4.4 mmol/L (ref 3.5–5.1)
Sodium: 140 mmol/L (ref 135–145)

## 2024-03-20 LAB — CBC
HCT: 52.1 % — ABNORMAL HIGH (ref 39.0–52.0)
Hemoglobin: 17.3 g/dL — ABNORMAL HIGH (ref 13.0–17.0)
MCH: 25.6 pg — ABNORMAL LOW (ref 26.0–34.0)
MCHC: 33.2 g/dL (ref 30.0–36.0)
MCV: 77.2 fL — ABNORMAL LOW (ref 80.0–100.0)
Platelets: 232 K/uL (ref 150–400)
RBC: 6.75 MIL/uL — ABNORMAL HIGH (ref 4.22–5.81)
RDW: 15.9 % — ABNORMAL HIGH (ref 11.5–15.5)
WBC: 4.6 K/uL (ref 4.0–10.5)
nRBC: 0 % (ref 0.0–0.2)

## 2024-03-20 LAB — HEMOGLOBIN A1C
Hgb A1c MFr Bld: 7.3 % — ABNORMAL HIGH (ref 4.8–5.6)
Mean Plasma Glucose: 162.81 mg/dL

## 2024-03-20 LAB — GLUCOSE, CAPILLARY: Glucose-Capillary: 158 mg/dL — ABNORMAL HIGH (ref 70–99)

## 2024-03-20 NOTE — Progress Notes (Signed)
 Surgical Instructions     Your procedure is scheduled on Friday, November 21st. Report to [facility] Main Entrance A at 7:30 A.M., then check in with the Admitting office. Any questions or running late day of surgery: call [phone]   Questions prior to your surgery date: call [phone], Monday-Friday, 8am-4pm. If you experience any cold or flu symptoms such as cough, fever, chills, shortness of breath, etc. between now and your scheduled surgery, please notify us  at the above number.            Remember:       Do not eat after midnight the night before your surgery     You may drink clear liquids until 6:30 the morning of your surgery.   Clear liquids allowed are: Water, Non-Citrus Juices (without pulp), Carbonated Beverages, Clear Tea (no milk, honey, etc.), Black Coffee Only (NO MILK, CREAM OR POWDERED CREAMER of any kind), and Gatorade.          Take these medicines the morning of surgery with A SIP OF WATER  amLODipine  (NORVASC )  atorvastatin  (LIPITOR)    May take these medicines IF NEEDED: fluticasone  (FLONASE )    Follow your surgeon's instructions on when to stop Asprin.  If no instructions were given by your surgeon then you will need to call the office to get those instructions.       One week prior to surgery, STOP taking any Aleve , Naproxen , Ibuprofen , Motrin , Advil , Goody's, BC's, all herbal medications, fish oil, and non-prescription vitamins. This includes your celecoxib (CELEBREX).     WHAT DO I DO ABOUT MY DIABETES MEDICATION?     Do not take metFORMIN  (GLUCOPHAGE ) on the morning of surgery.   Per your physician's instruction, HOLD your JARDIANCE  for 72 hours prior to surgery. Last dose on 03-19-24.  As of today, HOLD your OZEMPIC , for 7 day's prior to surgery.        HOW TO MANAGE YOUR DIABETES BEFORE AND AFTER SURGERY   Why is it important to control my blood sugar before and after surgery? Improving blood sugar levels before and after surgery  helps healing and can limit problems. A way of improving blood sugar control is eating a healthy diet by:  Eating less sugar and carbohydrates  Increasing activity/exercise  Talking with your doctor about reaching your blood sugar goals High blood sugars (greater than 180 mg/dL) can raise your risk of infections and slow your recovery, so you will need to focus on controlling your diabetes during the weeks before surgery. Make sure that the doctor who takes care of your diabetes knows about your planned surgery including the date and location.   How do I manage my blood sugar before surgery? Check your blood sugar at least 4 times a day, starting 2 days before surgery, to make sure that the level is not too high or low.   Check your blood sugar the morning of your surgery when you wake up and every 2 hours until you get to the Short Stay unit.   If your blood sugar is less than 70 mg/dL, you will need to treat for low blood sugar: Do not take insulin . Treat a low blood sugar (less than 70 mg/dL) with  cup of clear juice (cranberry or apple), 4 glucose tablets, OR glucose gel. Recheck blood sugar in 15 minutes after treatment (to make sure it is greater than 70 mg/dL). If your blood sugar is not greater than 70 mg/dL on recheck, call [phone] for  further instructions. Report your blood sugar to the short stay nurse when you get to Short Stay.   If you are admitted to the hospital after surgery: Your blood sugar will be checked by the staff and you will probably be given insulin  after surgery (instead of oral diabetes medicines) to make sure you have good blood sugar levels. The goal for blood sugar control after surgery is 80-180 mg/dL.                       Do NOT Smoke (Tobacco/Vaping) for 24 hours prior to your procedure.   If you use a CPAP at night, you may bring your mask/headgear for your overnight stay.   You will be asked to remove any contacts, glasses, piercing's, hearing  aid's, dentures/partials prior to surgery. Please bring cases for these items if needed.    Patients discharged the day of surgery will not be allowed to drive home, and someone needs to stay with them for 24 hours.   SURGICAL WAITING ROOM VISITATION Patients may have no more than 2 support people in the waiting area - these visitors may rotate.   Pre-op nurse will coordinate an appropriate time for 1 ADULT support person, who may not rotate, to accompany patient in pre-op.  Children under the age of 26 must have an adult with them who is not the patient and must remain in the main waiting area with an adult.   If the patient needs to stay at the hospital during part of their recovery, the visitor guidelines for inpatient rooms apply.   Please refer to the [facility] website for the visitor guidelines for any additional information.     If you received a COVID test during your pre-op visit  it is requested that you wear a mask when out in public, stay away from anyone that may not be feeling well and notify your surgeon if you develop symptoms. If you have been in contact with anyone that has tested positive in the last 10 days please notify you surgeon.         Pre-operative CHG Bathing Instructions    You can play a key role in reducing the risk of infection after surgery. Your skin needs to be as free of germs as possible. You can reduce the number of germs on your skin by washing with CHG (chlorhexidine  gluconate) soap before surgery. CHG is an antiseptic soap that kills germs and continues to kill germs even after washing.    DO NOT use if you have an allergy to chlorhexidine /CHG or antibacterial soaps. If your skin becomes reddened or irritated, stop using the CHG and notify one of our RNs at [phone].               TAKE A SHOWER THE NIGHT BEFORE SURGERY    Please keep in mind the following:  DO NOT shave, including legs and underarms, 48 hours prior to surgery.   You may  shave your face before/day of surgery.  Place clean sheets on your bed the night before surgery Use a clean washcloth (not used since being washed) for shower. DO NOT sleep with pet's night before surgery.   CHG Shower Instructions:  Wash your face and private area with normal soap. If you choose to wash your hair, wash first with your normal shampoo.  After you use shampoo/soap, rinse your hair and body thoroughly to remove shampoo/soap residue.  Turn the water OFF and  apply half the bottle of CHG soap to a CLEAN washcloth.  Apply CHG soap ONLY FROM YOUR NECK DOWN TO YOUR TOES (washing for 3-5 minutes)  DO NOT use CHG soap on face, private areas, open wounds, or sores.  Pay special attention to the area where your surgery is being performed.  If you are having back surgery, having someone wash your back for you may be helpful. Wait 2 minutes after CHG soap is applied, then you may rinse off the CHG soap.  Pat dry with a clean towel  Put on clean pajamas     Additional instructions for the day of surgery: If you choose, you may shower the morning of surgery with an antibacterial soap.  DO NOT APPLY any lotions, deodorants, cologne, or perfumes.   Do not wear jewelry or makeup Do not wear nail polish, gel polish, artificial nails, or any other type of covering on natural nails (fingers and toes) Do not bring valuables to the hospital. [facility] is not responsible for valuables/personal belongings. Put on clean/comfortable clothes.  Please brush your teeth.  Ask your nurse before applying any prescription medications to the skin.

## 2024-03-20 NOTE — Progress Notes (Signed)
 PCP - Corean Ku at [facility] Cardiologist - scheduled to see Shelda Bruckner on 05/07/24  PPM/ICD - denies   Chest x-ray - denies EKG - 01/24/24 Stress Test - 11/15/16 ECHO - 11/24/20 Cardiac Cath - denies  Sleep Study - OSA+ but does not wear CPAP   Fasting Blood Sugar - 109 Patient wears a Freestyle Libre  Last dose of GLP1 agonist-  last dose of Ozempic  was 11/13 and patient instructed not to take a dose prior to surgery  Last dose of Jardiance  - 03/20/24  Blood Thinner Instructions: n/a Aspirin  Instructions: patient instructed to contact Dr. Brion office for instructions  ERAS Protcol - clears until 0630 PRE-SURGERY Ensure or G2- n/a  COVID TEST-  n/a   Anesthesia review:  yes- DVT, PE  Patient denies shortness of breath, fever, cough and chest pain at PAT appointment   All instructions explained to the patient, with a verbal understanding of the material. Patient agrees to go over the instructions while at home for a better understanding. Patient also instructed to self quarantine after being tested for COVID-19. The opportunity to ask questions was provided.

## 2024-03-21 NOTE — Anesthesia Preprocedure Evaluation (Addendum)
 Anesthesia Evaluation  Patient identified by MRN, date of birth, ID band Patient awake    Reviewed: Allergy & Precautions, NPO status , Patient's Chart, lab work & pertinent test results  History of Anesthesia Complications Negative for: history of anesthetic complications  Airway Mallampati: II  TM Distance: >3 FB Neck ROM: Full    Dental  (+) Teeth Intact, Dental Advisory Given   Pulmonary sleep apnea , PE   breath sounds clear to auscultation       Cardiovascular hypertension, Pt. on medications and Pt. on home beta blockers + DVT  (-) CAD  Rhythm:Regular Rate:Normal  Echo 11/24/20:  1. Left ventricular ejection fraction, by estimation, is 55 to 60%. The left ventricle has normal function. The left ventricle has no regional wall motion abnormalities. There is moderate asymmetric left ventricular hypertrophy of the septal and basal-septal segments. Left ventricular diastolic parameters were normal.   2. Right ventricular systolic function is normal. The right ventricular size is normal. Tricuspid regurgitation signal is inadequate for assessing PA pressure.   3. The mitral valve is normal in structure. Trivial mitral valve regurgitation. No evidence of mitral stenosis.   4. The aortic valve is tricuspid. Aortic valve regurgitation is not visualized. No aortic stenosis is present.     CT coronary morphology 02/16/18:  1. Coronary calcium  score of 0. This was 0 percentile for age and sex matched control. 2. Normal coronary origin with left dominance. 3. No evidence of CAD.      Neuro/Psych  Headaches    GI/Hepatic ,GERD  Medicated and Controlled,,  Endo/Other  diabetes, Well Controlled, Type 2, Oral Hypoglycemic Agents  GLP1-agonist last dose (03/15/24)  Renal/GU Baseline Cr ~1.0-1.2     Musculoskeletal  (+) Arthritis ,  S/p TSA with recurring bone spurring at distal clavicle   Abdominal   Peds  Hematology Hgb 17.3,  Plts 232K (03/20/24)   Anesthesia Other Findings   Reproductive/Obstetrics                              Anesthesia Physical Anesthesia Plan  ASA: 2  Anesthesia Plan: General   Post-op Pain Management:    Induction: Intravenous  PONV Risk Score and Plan: 2 and Ondansetron , Dexamethasone , Treatment may vary due to age or medical condition and Midazolam   Airway Management Planned: Oral ETT and Video Laryngoscope Planned  Additional Equipment: None  Intra-op Plan:   Post-operative Plan: Extubation in OR  Informed Consent:      Dental advisory given  Plan Discussed with: CRNA  Anesthesia Plan Comments: (PAT note by Lynwood Hope, PA-C: 53 year old male with pertinent history including HTN, OSA not on CPAP, non-insulin -dependent DM2, PE 2013, GERD, migraines.   Patient was evaluated in the ED 01/24/2024 with complaint of chest pain after taking a pill.  Mild pressure throughout the day but eventually resolved.  Workup was benign, normal vitals, EKG was sinus rhythm without acute changes, troponin unremarkable x 2, CT of the chest unremarkable.  He was recommended follow-up with PCP.  Due to CVD risk factors, he was also recommended to establish with cardiology.  He has appointment scheduled to establish with Dr. Lonni 05/07/2024.  The patient denies any further symptoms of chest discomfort.  States he can walk up 2-3 flights of steps without any cardiopulmonary complaints.  He had coronary CTA 01/2018 with calcium  score of 0, no evidence of CAD.  Echocardiogram 10/2020 showed LVEF 55 to 60%, normal diastolic  parameters, normal RV, no significant valvular abnormalities.  Nuclear stress test 10/2016 was low risk.  History of difficult airway documented 03/17/2023 due to large tongue.  Grade III view with Mac 4.  Patient reports last dose of Ozempic  03/15/2024.  Preop labs reviewed, unremarkable. A1c 7.3.  EKG 01/24/24: Sinus rhythm. Rate 94. Probable left  atrial enlargement. Consider anterior infarct. Borderline T abnormalities, inferior leads  Echo 11/24/20:  1. Left ventricular ejection fraction, by estimation, is 55 to 60%. The left ventricle has normal function. The left ventricle has no regional wall motion abnormalities. There is moderate asymmetric left ventricular hypertrophy of the septal and basal-septal segments. Left ventricular diastolic parameters were normal.  2. Right ventricular systolic function is normal. The right ventricular size is normal. Tricuspid regurgitation signal is inadequate for assessing PA pressure.  3. The mitral valve is normal in structure. Trivial mitral valve regurgitation. No evidence of mitral stenosis.  4. The aortic valve is tricuspid. Aortic valve regurgitation is not visualized. No aortic stenosis is present.   CT coronary morphology 02/16/18:  1. Coronary calcium  score of 0. This was 0 percentile for age and sex matched control. 2. Normal coronary origin with left dominance. 3. No evidence of CAD.   Nuclear stress test 11/15/16:   Nuclear stress EF: 58%.  Blood pressure demonstrated a normal response to exercise.  There was no ST segment deviation noted during stress.  The study is normal.  This is a low risk study.  The left ventricular ejection fraction is normal (55-65%).    )         Anesthesia Quick Evaluation

## 2024-03-21 NOTE — Progress Notes (Addendum)
 Anesthesia Chart Review:  53 year old male with pertinent history including HTN, OSA not on CPAP, non-insulin -dependent DM2, PE 2013, GERD, migraines.   Patient was evaluated in the ED 01/24/2024 with complaint of chest pain after taking a pill.  Mild pressure throughout the day but eventually resolved.  Workup was benign, normal vitals, EKG was sinus rhythm without acute changes, troponin unremarkable x 2, CT of the chest unremarkable.  He was recommended follow-up with PCP.  Due to CVD risk factors, he was also recommended to establish with cardiology.  He has appointment scheduled to establish with Dr. Lonni 05/07/2024.  The patient denies any further symptoms of chest discomfort.  States he can walk up 2-3 flights of steps without any cardiopulmonary complaints.  He had coronary CTA 01/2018 with calcium  score of 0, no evidence of CAD.  Echocardiogram 10/2020 showed LVEF 55 to 60%, normal diastolic parameters, normal RV, no significant valvular abnormalities.  Nuclear stress test 10/2016 was low risk.  History of difficult airway documented 03/17/2023 due to large tongue.  Grade III view with Mac 4.  Patient reports last dose of Ozempic  03/15/2024.  Preop labs reviewed, unremarkable. A1c 7.3.  EKG 01/24/24: Sinus rhythm. Rate 94. Probable left atrial enlargement. Consider anterior infarct. Borderline T abnormalities, inferior leads  Echo 11/24/20:  1. Left ventricular ejection fraction, by estimation, is 55 to 60%. The left ventricle has normal function. The left ventricle has no regional wall motion abnormalities. There is moderate asymmetric left ventricular hypertrophy of the septal and basal-septal segments. Left ventricular diastolic parameters were normal.   2. Right ventricular systolic function is normal. The right ventricular size is normal. Tricuspid regurgitation signal is inadequate for assessing PA pressure.   3. The mitral valve is normal in structure. Trivial mitral valve  regurgitation. No evidence of mitral stenosis.   4. The aortic valve is tricuspid. Aortic valve regurgitation is not visualized. No aortic stenosis is present.     CT coronary morphology 02/16/18:  1. Coronary calcium  score of 0. This was 0 percentile for age and sex matched control. 2. Normal coronary origin with left dominance. 3. No evidence of CAD.     Nuclear stress test 11/15/16:  Nuclear stress EF: 58%. Blood pressure demonstrated a normal response to exercise. There was no ST segment deviation noted during stress. The study is normal. This is a low risk study. The left ventricular ejection fraction is normal (55-65%).     Lynwood Geofm RIGGERS [facility] Short Stay Center/Anesthesiology Phone [phone] 03/21/2024 2:33 PM

## 2024-03-22 ENCOUNTER — Ambulatory Visit: Admitting: Physical Medicine and Rehabilitation

## 2024-03-22 ENCOUNTER — Other Ambulatory Visit: Payer: Self-pay

## 2024-03-22 VITALS — BP 132/90 | HR 92

## 2024-03-22 DIAGNOSIS — M48062 Spinal stenosis, lumbar region with neurogenic claudication: Secondary | ICD-10-CM | POA: Diagnosis not present

## 2024-03-22 DIAGNOSIS — M47816 Spondylosis without myelopathy or radiculopathy, lumbar region: Secondary | ICD-10-CM | POA: Diagnosis not present

## 2024-03-22 DIAGNOSIS — F411 Generalized anxiety disorder: Secondary | ICD-10-CM | POA: Diagnosis not present

## 2024-03-22 DIAGNOSIS — M5416 Radiculopathy, lumbar region: Secondary | ICD-10-CM | POA: Diagnosis not present

## 2024-03-22 DIAGNOSIS — M25551 Pain in right hip: Secondary | ICD-10-CM | POA: Diagnosis not present

## 2024-03-22 MED ORDER — METHYLPREDNISOLONE ACETATE 40 MG/ML IJ SUSP
40.0000 mg | Freq: Once | INTRAMUSCULAR | Status: AC
  Administered 2024-03-22: 40 mg

## 2024-03-22 MED ORDER — DIAZEPAM 5 MG PO TABS: ORAL_TABLET | ORAL | 0 refills | Status: AC

## 2024-03-22 NOTE — Progress Notes (Signed)
 Pain Scale   Average Pain 10 Patient advising he has chronic lower back pain radiating to right leg pain increases when walking and sitting.        +Driver, -BT, -Dye Allergies.

## 2024-03-22 NOTE — Progress Notes (Signed)
 Scott [name] - 53 y.o. male MRN [id number]  Date of birth: 06/24/1970  Office Visit Note: Visit Date: 03/22/2024 PCP: Alvia Corean CROME, FNP Referred by: Alvia Corean CROME, *  Subjective: Chief Complaint  Patient presents with   Lower Back - Pain   HPI:  Scott [name] is a 53 y.o. male who comes in today at the request of Duwaine Pouch, FNP for planned Bilateral  L4-5 Lumbar facet/medial branch block with fluoroscopic guidance.  Unfortunately, when the patient called and he indicated this was for a repeat injection at the last injection had helped him greatly for a few months.  He reported and the phone call conversation which is documented that this is the same pain he was having before.  However it was noted that the last procedure was a radiofrequency ablation.  He has had 3 radiofrequency ablations which have helped but with diminishing returns evidently.  He states that the last procedures both of them have not really lasted very long even though they were documented at least the 1 prior to the last 1 more than 6 months of relief.  By way of review MRI from 2021 shows very small disc protrusion at L4-5 which is left paracentral and facet arthropathy at L4-5 mainly.  Otherwise fairly normal-appearing spine.  His pain pattern now is radiating to the right leg with walking.  This is different than in the past when we have seen him.  He has a complicated history of prior pulmonary embolism and diabetes.  He is followed by Dr. Addie for his knees and Dr. Harden.  He has had no recent falls or new injuries.  The reason the prior ablations may not have been as helpful as he may be getting new arthritic changes and pain pattern at L3-4.  I did review x-rays today.  He could be getting some radicular complaint as well even though that was not really evident on the prior MRI.  He has tried and failed all manner of conservative care.   Review of Systems  Musculoskeletal:  Positive for  back pain and joint pain.  All other systems reviewed and are negative.  Otherwise per HPI.  Assessment & Plan: Visit Diagnoses:    ICD-10-CM   1. Spondylosis without myelopathy or radiculopathy, lumbar region  M47.816 XR C-ARM NO REPORT    Facet Injection    methylPREDNISolone  acetate (DEPO-MEDROL ) injection 40 mg    2. Spinal stenosis of lumbar region with neurogenic claudication  M48.062 MR LUMBAR SPINE WO CONTRAST    3. Lumbar radiculopathy  M54.16 MR LUMBAR SPINE WO CONTRAST    4. Pain in right hip  M25.551 MR LUMBAR SPINE WO CONTRAST    5. Pre-operative anxiety  F41.1 diazepam  (VALIUM ) 5 MG tablet      Plan: Findings:  Chronic pain syndrome and pain somewhat out of proportion for prior imaging but had done well in the past with radiofrequency ablation for moderate facet arthritis.  MRI is from 2021.  Had called for repeat procedure but it really did not last like it showed with the ablation I tried to explain to him that those are actually denervating the joint and so it does not return that fast.  I think he has somewhat of a different pain pattern now clearly and this was not evident on the phone call or he would have probably had him come in for an evaluation.  Regardless to the evaluation today he decided to complete facet joint  block at L3-4 and L4-5.  This to be on the right side.  If he does well with that we will see how he does.  I have also ordered an MRI of the lumbar spine because of these new changes.  Concern would be for increasing lateral recess or central canal stenosis.  Prior MRI was 4 years ago.    Meds & Orders:  Meds ordered this encounter  Medications   methylPREDNISolone  acetate (DEPO-MEDROL ) injection 40 mg   diazepam  (VALIUM ) 5 MG tablet    Sig: Take 1 by mouth 1 hour  pre-procedure with very light food. Do not drive motor vehicle.    Dispense:  1 tablet    Refill:  0    Orders Placed This Encounter  Procedures   Facet Injection   XR C-ARM NO REPORT    MR LUMBAR SPINE WO CONTRAST    Follow-up: Return for visit to requesting provider as needed.   Procedures: No procedures performed  Lumbar Facet Joint Intra-Articular Injection(s) with Fluoroscopic Guidance  [name]      Date of Birth: March 06, 1971 MRN: [id number] PCP: Alvia Corean CROME, FNP      Visit Date: 03/22/2024   Universal Protocol:    Date/Time: 03/22/2024  Consent Given By: the patient  Position: PRONE   Additional Comments: Vital signs were monitored before and after the procedure. Patient was prepped and draped in the usual sterile fashion. The correct patient, procedure, and site was verified.   Injection Procedure Details:  Procedure Site One Meds Administered:  Meds ordered this encounter  Medications   methylPREDNISolone  acetate (DEPO-MEDROL ) injection 40 mg     Laterality: Right  Location/Site:  L3-L4 L4-L5  Needle size: 22 guage  Needle type: Spinal  Needle Placement: Articular  Findings:  -Comments: Excellent flow of contrast producing a partial arthrogram.  Procedure Details: The fluoroscope beam is vertically oriented in AP, and the inferior recess is visualized beneath the lower pole of the inferior apophyseal process, which represents the target point for needle insertion. When direct visualization is difficult the target point is located at the medial projection of the vertebral pedicle. The region overlying each aforementioned target is locally anesthetized with a 1 to 2 ml. volume of 1% Lidocaine  without Epinephrine .   The spinal needle was inserted into each of the above mentioned facet joints using biplanar fluoroscopic guidance. A 0.25 to 0.5 ml. volume of Isovue -250 was injected and a partial facet joint arthrogram was obtained. A single spot film was obtained of the resulting arthrogram.    One to 1.25 ml of the steroid/anesthetic solution was then injected into each of the facet joints noted  above.   Additional Comments:  No complications occurred Dressing: 2 x 2 sterile gauze and Band-Aid    Post-procedure details: Patient was observed during the procedure. Post-procedure instructions were reviewed.  Patient left the clinic in stable condition.    Clinical History: MRI LUMBAR SPINE WITHOUT CONTRAST     TECHNIQUE:  Multiplanar, multisequence MR imaging of the lumbar spine was  performed. No intravenous contrast was administered.     COMPARISON:  Prior radiograph from 01/16/2020 and MRI from  02/05/2017.     FINDINGS:  Segmentation:  Standard.     Alignment:  Physiologic.  No listhesis.     Vertebrae: Vertebral body height maintained without acute or chronic  fracture. Bone marrow signal intensity within normal limits. No  discrete or worrisome osseous lesions. Mild reactive marrow edema  noted  about the right greater than left L4-5 facets due to facet  arthritis. No other abnormal marrow edema.     Conus medullaris and cauda equina: Conus extends to the L1 level.  Conus and cauda equina appear normal.     Paraspinal and other soft tissues: Unremarkable.     Disc levels:     L1-2:  Unremarkable.     L2-3:  Unremarkable.     L3-4: Normal interspace. Minimal facet spurring. No canal or  foraminal stenosis. No impingement.     L4-5: Disc desiccation. Shallow broad-based left subarticular disc  protrusion indents the left ventral thecal sac, encroaching upon the  left lateral recess. Associated central annular fissure. Moderate  bilateral facet hypertrophy, mildly progressed from previous.  Resultant mild canal with moderate left lateral recess stenosis,  descending L5 nerve root level. Mild to moderate bilateral L4  foraminal narrowing. Appearance is overall relatively similar to  previous.     L5-S1: Normal interspace. Moderate right with mild left facet  hypertrophy, mildly progressed from previous. No stenosis or  impingement.     IMPRESSION:   1. Shallow left subarticular disc protrusion at L4-5 with resultant  mild canal and moderate left lateral recess stenosis, potentially  affecting the descending left L5 nerve root.  2. Moderate bilateral facet hypertrophy at L4-5 and L5-S1, mildly  progressed from previous, and could contribute to lower back pain.        Electronically Signed    By: Morene Hoard M.D.    On: 01/27/2020 01:46     Objective:  VS:  HT:    WT:   BMI:     BP:(!) 132/90  HR:92bpm  TEMP: ( )  RESP:  Physical Exam Vitals and nursing note reviewed.  Constitutional:      General: He is not in acute distress.    Appearance: Normal appearance. He is not ill-appearing.  HENT:     Head: Normocephalic and atraumatic.     Right Ear: External ear normal.     Left Ear: External ear normal.     Nose: No congestion.  Eyes:     Extraocular Movements: Extraocular movements intact.  Cardiovascular:     Rate and Rhythm: Normal rate.     Pulses: Normal pulses.  Pulmonary:     Effort: Pulmonary effort is normal. No respiratory distress.  Abdominal:     General: There is no distension.     Palpations: Abdomen is soft.  Musculoskeletal:        General: No tenderness or signs of injury.     Cervical back: Neck supple.     Right lower leg: No edema.     Left lower leg: No edema.     Comments: Patient has good distal strength without clonus.  Skin:    Findings: No erythema or rash.  Neurological:     General: No focal deficit present.     Mental Status: He is alert and oriented to person, place, and time.     Sensory: No sensory deficit.     Motor: No weakness or abnormal muscle tone.     Coordination: Coordination normal.  Psychiatric:        Mood and Affect: Mood normal.        Behavior: Behavior normal.      Imaging: No results found.

## 2024-03-22 NOTE — Procedures (Signed)
 Lumbar Facet Joint Intra-Articular Injection(s) with Fluoroscopic Guidance  [name]      Date of Birth: 04-Apr-1971 MRN: [id number] PCP: Alvia Corean CROME, FNP      Visit Date: 03/22/2024   Universal Protocol:    Date/Time: 03/22/2024  Consent Given By: the patient  Position: PRONE   Additional Comments: Vital signs were monitored before and after the procedure. Patient was prepped and draped in the usual sterile fashion. The correct patient, procedure, and site was verified.   Injection Procedure Details:  Procedure Site One Meds Administered:  Meds ordered this encounter  Medications   methylPREDNISolone  acetate (DEPO-MEDROL ) injection 40 mg     Laterality: Right  Location/Site:  L3-L4 L4-L5  Needle size: 22 guage  Needle type: Spinal  Needle Placement: Articular  Findings:  -Comments: Excellent flow of contrast producing a partial arthrogram.  Procedure Details: The fluoroscope beam is vertically oriented in AP, and the inferior recess is visualized beneath the lower pole of the inferior apophyseal process, which represents the target point for needle insertion. When direct visualization is difficult the target point is located at the medial projection of the vertebral pedicle. The region overlying each aforementioned target is locally anesthetized with a 1 to 2 ml. volume of 1% Lidocaine  without Epinephrine .   The spinal needle was inserted into each of the above mentioned facet joints using biplanar fluoroscopic guidance. A 0.25 to 0.5 ml. volume of Isovue -250 was injected and a partial facet joint arthrogram was obtained. A single spot film was obtained of the resulting arthrogram.    One to 1.25 ml of the steroid/anesthetic solution was then injected into each of the facet joints noted above.   Additional Comments:  No complications occurred Dressing: 2 x 2 sterile gauze and Band-Aid    Post-procedure details: Patient was observed  during the procedure. Post-procedure instructions were reviewed.  Patient left the clinic in stable condition.

## 2024-03-23 ENCOUNTER — Ambulatory Visit (HOSPITAL_COMMUNITY): Admitting: Registered Nurse

## 2024-03-23 ENCOUNTER — Ambulatory Visit (HOSPITAL_COMMUNITY): Admitting: Physician Assistant

## 2024-03-23 ENCOUNTER — Ambulatory Visit (HOSPITAL_COMMUNITY)
Admission: RE | Admit: 2024-03-23 | Discharge: 2024-03-23 | Disposition: A | Discharge status: Home / Self Care | Attending: Orthopedic Surgery | Admitting: Orthopedic Surgery

## 2024-03-23 ENCOUNTER — Encounter (HOSPITAL_COMMUNITY)
Admission: RE | Disposition: A | Discharge status: Home / Self Care | Payer: Self-pay | Source: Home / Self Care | Attending: Orthopedic Surgery

## 2024-03-23 DIAGNOSIS — E119 Type 2 diabetes mellitus without complications: Secondary | ICD-10-CM | POA: Diagnosis not present

## 2024-03-23 DIAGNOSIS — Z7985 Long-term (current) use of injectable non-insulin antidiabetic drugs: Secondary | ICD-10-CM | POA: Insufficient documentation

## 2024-03-23 DIAGNOSIS — Z7984 Long term (current) use of oral hypoglycemic drugs: Secondary | ICD-10-CM | POA: Insufficient documentation

## 2024-03-23 DIAGNOSIS — M25712 Osteophyte, left shoulder: Secondary | ICD-10-CM

## 2024-03-23 DIAGNOSIS — G4730 Sleep apnea, unspecified: Secondary | ICD-10-CM | POA: Diagnosis not present

## 2024-03-23 DIAGNOSIS — I110 Hypertensive heart disease with heart failure: Secondary | ICD-10-CM | POA: Diagnosis not present

## 2024-03-23 DIAGNOSIS — K219 Gastro-esophageal reflux disease without esophagitis: Secondary | ICD-10-CM | POA: Diagnosis not present

## 2024-03-23 DIAGNOSIS — Z01818 Encounter for other preprocedural examination: Secondary | ICD-10-CM

## 2024-03-23 DIAGNOSIS — Z86718 Personal history of other venous thrombosis and embolism: Secondary | ICD-10-CM | POA: Diagnosis not present

## 2024-03-23 DIAGNOSIS — M1990 Unspecified osteoarthritis, unspecified site: Secondary | ICD-10-CM | POA: Diagnosis not present

## 2024-03-23 DIAGNOSIS — M19012 Primary osteoarthritis, left shoulder: Secondary | ICD-10-CM | POA: Diagnosis not present

## 2024-03-23 DIAGNOSIS — I5032 Chronic diastolic (congestive) heart failure: Secondary | ICD-10-CM | POA: Insufficient documentation

## 2024-03-23 DIAGNOSIS — Z79899 Other long term (current) drug therapy: Secondary | ICD-10-CM | POA: Insufficient documentation

## 2024-03-23 HISTORY — PX: RESECTION DISTAL CLAVICAL: SHX5053

## 2024-03-23 LAB — GLUCOSE, CAPILLARY
Glucose-Capillary: 145 mg/dL — ABNORMAL HIGH (ref 70–99)
Glucose-Capillary: 136 mg/dL — ABNORMAL HIGH (ref 70–99)
Glucose-Capillary: 154 mg/dL — ABNORMAL HIGH (ref 70–99)

## 2024-03-23 SURGERY — EXCISION, CLAVICLE, DISTAL, OPEN
Anesthesia: General | Site: Shoulder | Laterality: Left

## 2024-03-23 MED ORDER — AMISULPRIDE (ANTIEMETIC) 5 MG/2ML IV SOLN: 10.0000 mg | Freq: Once | INTRAVENOUS | Status: DC | PRN

## 2024-03-23 MED ORDER — CEFAZOLIN SODIUM-DEXTROSE 2-4 GM/100ML-% IV SOLN
2.0000 g | INTRAVENOUS | Status: AC
  Administered 2024-03-23: 2 g via INTRAVENOUS
  Filled 2024-03-23: qty 100

## 2024-03-23 MED ORDER — LACTATED RINGERS IV SOLN: INTRAVENOUS | Status: DC

## 2024-03-23 MED ORDER — FENTANYL CITRATE (PF) 100 MCG/2ML IJ SOLN: 25.0000 ug | INTRAMUSCULAR | Status: DC | PRN

## 2024-03-23 MED ORDER — INSULIN ASPART 100 UNIT/ML IJ SOLN
0.0000 [IU] | INTRAMUSCULAR | Status: DC | PRN
  Administered 2024-03-23: 2 [IU] via SUBCUTANEOUS

## 2024-03-23 MED ORDER — DEXAMETHASONE SOD PHOSPHATE PF 10 MG/ML IJ SOLN
INTRAMUSCULAR | Status: DC | PRN
  Administered 2024-03-23: 5 mg via INTRAVENOUS

## 2024-03-23 MED ORDER — ONDANSETRON HCL 4 MG/2ML IJ SOLN
INTRAMUSCULAR | Status: DC | PRN
  Administered 2024-03-23: 4 mg via INTRAVENOUS

## 2024-03-23 MED ORDER — SUGAMMADEX SODIUM 200 MG/2ML IV SOLN
INTRAVENOUS | Status: DC | PRN
  Administered 2024-03-23: 200 mg via INTRAVENOUS

## 2024-03-23 MED ORDER — LIDOCAINE 2% (20 MG/ML) 5 ML SYRINGE
INTRAMUSCULAR | Status: DC | PRN
  Administered 2024-03-23: 100 mg via INTRAVENOUS

## 2024-03-23 MED ORDER — CHLORHEXIDINE GLUCONATE 0.12 % MT SOLN
15.0000 mL | Freq: Once | OROMUCOSAL | Status: AC
  Administered 2024-03-23: 15 mL via OROMUCOSAL
  Filled 2024-03-23: qty 15

## 2024-03-23 MED ORDER — ROCURONIUM BROMIDE 10 MG/ML (PF) SYRINGE
PREFILLED_SYRINGE | INTRAVENOUS | Status: AC
  Filled 2024-03-23: qty 10

## 2024-03-23 MED ORDER — INSULIN ASPART 100 UNIT/ML IJ SOLN
INTRAMUSCULAR | Status: DC
Start: 2024-03-23 — End: 2024-03-23
  Filled 2024-03-23: qty 2

## 2024-03-23 MED ORDER — PHENYLEPHRINE 80 MCG/ML (10ML) SYRINGE FOR IV PUSH (FOR BLOOD PRESSURE SUPPORT)
PREFILLED_SYRINGE | INTRAVENOUS | Status: AC
  Filled 2024-03-23: qty 10

## 2024-03-23 MED ORDER — OXYCODONE HCL 5 MG/5ML PO SOLN: 5.0000 mg | Freq: Once | ORAL | Status: DC | PRN

## 2024-03-23 MED ORDER — 0.9 % SODIUM CHLORIDE (POUR BTL) OPTIME
TOPICAL | Status: DC | PRN
  Administered 2024-03-23: 1000 mL

## 2024-03-23 MED ORDER — ACETAMINOPHEN 500 MG PO TABS
1000.0000 mg | ORAL_TABLET | Freq: Once | ORAL | Status: AC
  Administered 2024-03-23: 1000 mg via ORAL

## 2024-03-23 MED ORDER — MIDAZOLAM HCL (PF) 2 MG/2ML IJ SOLN
INTRAMUSCULAR | Status: DC | PRN
  Administered 2024-03-23: 2 mg via INTRAVENOUS

## 2024-03-23 MED ORDER — PHENYLEPHRINE HCL-NACL 20-0.9 MG/250ML-% IV SOLN
INTRAVENOUS | Status: DC | PRN
  Administered 2024-03-23: 30 ug/min via INTRAVENOUS

## 2024-03-23 MED ORDER — PROPOFOL 10 MG/ML IV BOLUS
INTRAVENOUS | Status: DC | PRN
  Administered 2024-03-23: 50 mg via INTRAVENOUS
  Administered 2024-03-23: 100 mg via INTRAVENOUS
  Administered 2024-03-23 (×2): 50 mg via INTRAVENOUS

## 2024-03-23 MED ORDER — OXYCODONE HCL 5 MG PO TABS
5.0000 mg | ORAL_TABLET | ORAL | 0 refills | Status: AC | PRN
Start: 2024-03-23 — End: ?

## 2024-03-23 MED ORDER — LIDOCAINE 2% (20 MG/ML) 5 ML SYRINGE
INTRAMUSCULAR | Status: AC
Start: 2024-03-23 — End: 2024-03-23
  Filled 2024-03-23: qty 5

## 2024-03-23 MED ORDER — ORAL CARE MOUTH RINSE: 15.0000 mL | Freq: Once | OROMUCOSAL | Status: AC

## 2024-03-23 MED ORDER — PHENYLEPHRINE HCL (PRESSORS) 10 MG/ML IV SOLN
INTRAVENOUS | Status: DC | PRN
  Administered 2024-03-23: 80 ug via INTRAVENOUS

## 2024-03-23 MED ORDER — FENTANYL CITRATE (PF) 100 MCG/2ML IJ SOLN
INTRAMUSCULAR | Status: AC
  Filled 2024-03-23: qty 2

## 2024-03-23 MED ORDER — ACETAMINOPHEN 500 MG PO TABS
ORAL_TABLET | ORAL | Status: AC
  Filled 2024-03-23: qty 2

## 2024-03-23 MED ORDER — LABETALOL HCL 5 MG/ML IV SOLN
INTRAVENOUS | Status: AC
  Administered 2024-03-23: 5 mg
  Filled 2024-03-23: qty 4

## 2024-03-23 MED ORDER — OXYCODONE HCL 5 MG PO TABS: 5.0000 mg | ORAL_TABLET | Freq: Once | ORAL | Status: DC | PRN

## 2024-03-23 MED ORDER — ONDANSETRON HCL 4 MG/2ML IJ SOLN: 4.0000 mg | Freq: Once | INTRAMUSCULAR | Status: DC | PRN

## 2024-03-23 MED ORDER — PHENYLEPHRINE 80 MCG/ML (10ML) SYRINGE FOR IV PUSH (FOR BLOOD PRESSURE SUPPORT)
PREFILLED_SYRINGE | INTRAVENOUS | Status: DC | PRN
  Administered 2024-03-23: 80 ug via INTRAVENOUS

## 2024-03-23 MED ORDER — ACETAMINOPHEN 10 MG/ML IV SOLN: 1000.0000 mg | Freq: Once | INTRAVENOUS | Status: DC | PRN

## 2024-03-23 MED ORDER — FENTANYL CITRATE (PF) 250 MCG/5ML IJ SOLN
INTRAMUSCULAR | Status: DC | PRN
  Administered 2024-03-23 (×2): 50 ug via INTRAVENOUS

## 2024-03-23 MED ORDER — LABETALOL HCL 5 MG/ML IV SOLN
5.0000 mg | Freq: Once | INTRAVENOUS | Status: AC
  Administered 2024-03-23: 5 mg via INTRAVENOUS

## 2024-03-23 MED ORDER — MIDAZOLAM HCL 2 MG/2ML IJ SOLN
INTRAMUSCULAR | Status: AC
  Filled 2024-03-23: qty 2

## 2024-03-23 MED ORDER — ROCURONIUM BROMIDE 10 MG/ML (PF) SYRINGE
PREFILLED_SYRINGE | INTRAVENOUS | Status: DC | PRN
Start: 2024-03-23 — End: 2024-03-23
  Administered 2024-03-23: 20 mg via INTRAVENOUS
  Administered 2024-03-23: 60 mg via INTRAVENOUS

## 2024-03-23 SURGICAL SUPPLY — 53 items
BAG COUNTER SPONGE SURGICOUNT (BAG) ×1 IMPLANT
BENZOIN TINCTURE PRP APPL 2/3 (GAUZE/BANDAGES/DRESSINGS) ×1 IMPLANT
BLADE LONG MED 31X9 (MISCELLANEOUS) IMPLANT
BLADE SAW SAG 29X58X.64 (BLADE) ×1 IMPLANT
BLADE SAW SGTL 83.5X18.5 (BLADE) IMPLANT
BLADE SURG 15 STRL LF DISP TIS (BLADE) ×2 IMPLANT
CLSR STERI-STRIP ANTIMIC 1/2X4 (GAUZE/BANDAGES/DRESSINGS) IMPLANT
COVER BACK TABLE 24X17X13 BIG (DRAPES) ×1 IMPLANT
COVER SURGICAL LIGHT HANDLE (MISCELLANEOUS) ×1 IMPLANT
DRAPE IMP U-DRAPE 54X76 (DRAPES) ×1 IMPLANT
DRAPE INCISE IOBAN 66X45 STRL (DRAPES) ×1 IMPLANT
DRAPE SURG ORHT 6 SPLT 77X108 (DRAPES) ×1 IMPLANT
DRAPE U-SHAPE 47X51 STRL (DRAPES) ×1 IMPLANT
DRSG AQUACEL AG ADV 3.5X 4 (GAUZE/BANDAGES/DRESSINGS) IMPLANT
DURAPREP 26ML APPLICATOR (WOUND CARE) ×1 IMPLANT
ELECTRODE REM PT RTRN 9FT ADLT (ELECTROSURGICAL) ×1 IMPLANT
GAUZE PAD ABD 8X10 STRL (GAUZE/BANDAGES/DRESSINGS) ×1 IMPLANT
GAUZE SPONGE 4X4 12PLY STRL (GAUZE/BANDAGES/DRESSINGS) ×1 IMPLANT
GAUZE SPONGE 4X4 12PLY STRL LF (GAUZE/BANDAGES/DRESSINGS) IMPLANT
GAUZE XEROFORM 1X8 LF (GAUZE/BANDAGES/DRESSINGS) ×1 IMPLANT
GLOVE BIOGEL PI IND STRL 8 (GLOVE) ×1 IMPLANT
GLOVE BIOGEL PI MICRO 8.0 (GLOVE) ×1 IMPLANT
GOWN STRL REUS W/ TWL LRG LVL3 (GOWN DISPOSABLE) ×2 IMPLANT
GOWN STRL REUS W/ TWL XL LVL3 (GOWN DISPOSABLE) ×1 IMPLANT
KIT BASIN OR (CUSTOM PROCEDURE TRAY) ×1 IMPLANT
KIT TURNOVER KIT B (KITS) ×1 IMPLANT
MANIFOLD NEPTUNE II (INSTRUMENTS) ×1 IMPLANT
NDL 1/2 CIR CATGUT .05X1.09 (NEEDLE) IMPLANT
NDL HYPO 25GX1X1/2 BEV (NEEDLE) ×1 IMPLANT
NEEDLE 1/2 CIR CATGUT .05X1.09 (NEEDLE) IMPLANT
NEEDLE HYPO 25GX1X1/2 BEV (NEEDLE) ×1 IMPLANT
PACK SHOULDER (CUSTOM PROCEDURE TRAY) ×1 IMPLANT
PACK UNIVERSAL I (CUSTOM PROCEDURE TRAY) ×1 IMPLANT
PAD ABD 8X10 STRL (GAUZE/BANDAGES/DRESSINGS) IMPLANT
PAD ARMBOARD POSITIONER FOAM (MISCELLANEOUS) ×2 IMPLANT
PASSER SUT SWANSON 36MM LOOP (INSTRUMENTS) IMPLANT
SET HNDPC FAN SPRY TIP SCT (DISPOSABLE) IMPLANT
SLING ARM FOAM STRAP LRG (SOFTGOODS) IMPLANT
SOLN 0.9% NACL POUR BTL 1000ML (IV SOLUTION) ×1 IMPLANT
SOLN STERILE WATER BTL 1000 ML (IV SOLUTION) ×1 IMPLANT
SPONGE T-LAP 4X18 ~~LOC~~+RFID (SPONGE) ×2 IMPLANT
STAPLER SKIN PROX 35W (STAPLE) ×1 IMPLANT
SUCTION TUBE FRAZIER 10FR DISP (SUCTIONS) ×1 IMPLANT
SUT BONE WAX W31G (SUTURE) IMPLANT
SUT VIC AB 0 CT1 27XBRD ANBCTR (SUTURE) ×1 IMPLANT
SUT VIC AB 2-0 CTB1 (SUTURE) IMPLANT
SUT VICRYL AB 2 0 TIES (SUTURE) IMPLANT
SYR CONTROL 10ML LL (SYRINGE) ×1 IMPLANT
TAPE CLOTH SURG 4X10 WHT LF (GAUZE/BANDAGES/DRESSINGS) IMPLANT
TOWEL GREEN STERILE (TOWEL DISPOSABLE) ×1 IMPLANT
TOWEL GREEN STERILE FF (TOWEL DISPOSABLE) ×1 IMPLANT
TOWER CARTRIDGE SMART MIX (DISPOSABLE) IMPLANT
TRAY FOLEY MTR SLVR 16FR STAT (SET/KITS/TRAYS/PACK) IMPLANT

## 2024-03-23 NOTE — Anesthesia Procedure Notes (Signed)
 Procedure Name: Intubation Date/Time: 03/23/2024 11:05 AM  Performed by: Canda Augustin KIDD, CRNAPre-anesthesia Checklist: Patient identified, Emergency Drugs available, Patient being monitored and Timeout performed Patient Re-evaluated:Patient Re-evaluated prior to induction Oxygen Delivery Method: Circle system utilized Preoxygenation: Pre-oxygenation with 100% oxygen Induction Type: IV induction Ventilation: Oral airway inserted - appropriate to patient size and Mask ventilation without difficulty Laryngoscope Size: Mac, 4 and Glidescope Grade View: Grade I Tube type: Oral Tube size: 7.5 mm Number of attempts: 1 Airway Equipment and Method: Stylet Placement Confirmation: ETT inserted through vocal cords under direct vision, positive ETCO2, CO2 detector and breath sounds checked- equal and bilateral Secured at: 23 cm Tube secured with: Tape Dental Injury: Teeth and Oropharynx as per pre-operative assessment

## 2024-03-23 NOTE — Op Note (Unsigned)
 NAME: Scott [name], Scott [name] MEDICAL RECORD NO: [id number] ACCOUNT NO: [id number] DATE OF BIRTH: 04/27/1971 FACILITY: [facility] LOCATION: [facility] PHYSICIAN: Cordella RAMAN. Addie, MD  Operative Report   DATE OF PROCEDURE: 03/23/2024  PREOPERATIVE DIAGNOSIS:  Left distal clavicle spurring.  POSTOPERATIVE DIAGNOSIS:  Left distal clavicle spurring.  PROCEDURE:  Left distal clavicle open spur removal and resection.  SURGEON:  Cordella RAMAN. Addie, MD  ASSISTANT:  Herlene Calix, PA  INDICATIONS:  The patient is a 53 year old patient who underwent arthroscopic distal clavicle excision about a year ago.  He has had some recurrent spur bone growth at the end of the clavicle which is impinging.  He has had 2 injections which have helped  him but they have been short-lived.  Presents now for operative management after explanation of risk and benefits.  DESCRIPTION OF PROCEDURE:  The patient was brought to the operating room where general anesthetic was induced.  Preoperative antibiotics were administered.  Timeout was called.  The patient was placed in the beach chair position with the head in neutral  position.  Left shoulder, arm, and hand prescrubbed with alcohol and Betadine  and allowed to air dry, prepped with DuraPrep solution, and draped in a sterile manner.  Ioban was used to cover the entire operative field.  After calling timeout, about a 3  cm incision was made centered over the distal clavicle.  Skin and subcutaneous tissue were sharply divided.  Full thickness fascial flaps were elevated off of the distal clavicle.  Superior spur was noted and removed.  Small Bennett retractors were  placed and an additional 3-4 mm of the distal clavicle was resected.  Full decompression was confirmed.  Thorough irrigation was performed.  Bone wax was placed on the distal end of the clavicle and on all exposed bone.  The fascial layer was then closed  using #1 Vicryl suture, followed by an interrupted inverted 0 Vicryl  suture, 2-0 Vicryl suture, and 3-0 Monocryl with Aquacel dressing and shoulder sling applied.  The patient tolerated the procedure well without immediate complications.  He was  transferred to the recovery room in stable condition.  Luke's assistance was required for opening, closing, and retraction.  His assistance was of medical necessity.    MUK D: 03/23/2024 3:39:57 pm T: 03/23/2024 9:16:00 pm  JOB: [id number]/ [id number]

## 2024-03-23 NOTE — H&P (Addendum)
 Scott [name] is an 53 y.o. male.   Chief Complaint: left shoulder pain  HPI: Scott [name] is a 53 y.o. male who presents  reporting left shoulder and left knee pain.  Since he was last seen has had an MRI scan of the left shoulder.  That shows adequate decompression of the [facility] joint but recurrent spur growth on the superior aspect of the distal clavicle.  This has been the area where he has had pain.  He has had 2 injections with good but temporary relief.  Rest of the shoulder looks good in terms of rotator cuff and biceps tendon.  Hard for him to sleep on that left-hand side.  Most of his pain is superior.  He did have an ablation in his lumbar spine about 3 months ago.  He does have back pain does radiate down to the right knee.  Has a known history of right knee arthritis.  The back is about 65% in the right knee is about 35%.  He has had 3 knee injections so far this year.  They were cortisone injections   Past Medical History:  Diagnosis Date   Allergy    Tree/pollen   Arthritis    Carpal tunnel syndrome    Chronic migraine 01/23/2019   Diabetes mellitus    Diastolic heart failure (HCC)    DVT (deep venous thrombosis) (HCC)    left calf   GERD (gastroesophageal reflux disease)    Hypertension    Obesity    Pulmonary embolism (HCC) 2013   He has a history of HTN.  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 A repeat scan done in 2017 did not demonstrate evidence of emboli. Neither study demonstrated any coronary calcifications.    Pulmonary embolism (HCC)    Shoulder pain, bilateral    Sleep apnea    was fitted for mask and never got machine - too costly    Past Surgical History:  Procedure Laterality Date   CARPAL TUNNEL RELEASE Right 12/04/2015   Procedure: RIGHT CARPAL TUNNEL RELEASE;  Surgeon: Franky Curia, MD;  Location: [facility];  Service: Orthopedics;  Laterality: Right;  RIGHT CARPAL TUNNEL RELEASE   CARPAL TUNNEL  RELEASE Right 12/2015   [facility]   CARPAL TUNNEL RELEASE Left 04/01/2016   Procedure: LEFT CARPAL TUNNEL RELEASE;  Surgeon: Franky Curia, MD;  Location: [facility];  Service: Orthopedics;  Laterality: Left;   COLONOSCOPY     I & D EXTREMITY Left 02/19/2021   Procedure: LEFT FOOT SPUR REMOVAL;  Surgeon: Addie Cordella Hamilton, MD;  Location: [facility] OR;  Service: Orthopedics;  Laterality: Left;   KNEE ARTHROSCOPY WITH MEDIAL MENISECTOMY Left 02/19/2021   Procedure: LEFT KNEE ARTHROSCOPY, MENISCAL CYST DECOMPRESSION;  Surgeon: Addie Cordella Hamilton, MD;  Location: [facility] OR;  Service: Orthopedics;  Laterality: Left;   right hand surgery     ROTATOR CUFF REPAIR Right    SHOULDER ARTHROSCOPY Left 03/2023    Family History  Problem Relation Age of Onset   Hypertension Mother    Arthritis Mother    Kidney disease Father    Diabetes Father    Hypertension Other    Hyperlipidemia Other    Diabetes Other    Social History:  reports that he has never smoked. He has never used smokeless tobacco. He reports that he does not drink alcohol and does not use drugs.  Allergies:  Allergies  Allergen Reactions   Pollen  Extract Other (See Comments)    Medications Prior to Admission  Medication Sig Dispense Refill   amLODipine  (NORVASC ) 10 MG tablet TAKE 1 TABLET BY MOUTH DAILY 90 tablet 3   aspirin  81 MG chewable tablet Chew 81 mg by mouth daily.     atorvastatin  (LIPITOR) 20 MG tablet Take 1 tablet (20 mg total) by mouth daily. 90 tablet 3   celecoxib (CELEBREX) 200 MG capsule Take 200 mg by mouth 2 (two) times daily as needed for moderate pain (pain score 4-6).     JARDIANCE  25 MG TABS tablet TAKE 1 TABLET BY MOUTH DAILY 30 tablet 1   levocetirizine (XYZAL ) 5 MG tablet Take 1 tablet (5 mg total) by mouth every evening. (Patient taking differently: Take 5 mg by mouth daily as needed for allergies.) 90 tablet 1   lisinopril  (ZESTRIL ) 20 MG tablet Take 1 tablet (20 mg total) by mouth daily. 90 tablet  3   metFORMIN  (GLUCOPHAGE ) 500 MG tablet TAKE 1 TABLET BY MOUTH TWICE A DAY WITH A MEAL (Patient taking differently: Take 500 mg by mouth daily as needed (high blood sugar).) 180 tablet 3   OPZELURA  1.5 % CREA APPLY ONE APPLICATION TOPICALLY TWO (2) TIMES DAILY. 60 g 1   OZEMPIC , 2 MG/DOSE, 8 MG/3ML SOPN DIAL AND INJECT UNDER THE SKIN 2 MG WEEKLY 3 mL 5   diazepam  (VALIUM ) 5 MG tablet Take 1 by mouth 1 hour  pre-procedure with very light food. Do not drive motor vehicle. 1 tablet 0   dimenhyDRINATE  (DRAMAMINE) 50 MG tablet Take 1 tablet (50 mg total) by mouth every 8 (eight) hours as needed. (Patient not taking: Reported on 03/16/2024) 30 tablet 0   fluticasone  (FLONASE ) 50 MCG/ACT nasal spray Place 2 sprays into both nostrils daily. (Patient taking differently: Place 2 sprays into both nostrils daily as needed for allergies.) 16 mL 0   glucose blood test strip Use as instructed. Inject into the skin once daily. E11.65 200 each 12   HYDROcodone -acetaminophen  (NORCO/VICODIN) 5-325 MG tablet Take 1 tablet by mouth every 8 (eight) hours as needed for moderate pain (pain score 4-6). (Patient not taking: Reported on 03/16/2024) 20 tablet 0   methocarbamol  (ROBAXIN ) 500 MG tablet Take 1 tablet (500 mg total) by mouth every 8 (eight) hours as needed for muscle spasms. (Patient not taking: Reported on 03/16/2024) 30 tablet 1   scopolamine  (TRANSDERM SCOP , 1.5 MG,) 1 MG/3DAYS Place 1 patch (1.5 mg total) onto the skin every 3 (three) days. (Patient not taking: Reported on 03/16/2024) 10 patch 12   tacrolimus  (PROTOPIC ) 0.1 % ointment Apply 2 grams twice daily to affected areas of skin (Patient not taking: Reported on 03/16/2024) 100 g 5    Results for orders placed or performed during the hospital encounter of 03/23/24 (from the past 48 hours)  Glucose, capillary     Status: Abnormal   Collection Time: 03/23/24  8:05 AM  Result Value Ref Range   Glucose-Capillary 145 (H) 70 - 99 mg/dL    Comment: Glucose  reference range applies only to samples taken after fasting for at least 8 hours.   Comment 1 Notify RN    XR C-ARM NO REPORT Result Date: 03/22/2024 Please see Notes tab for imaging impression.   Review of Systems  Musculoskeletal:  Positive for arthralgias.  All other systems reviewed and are negative.   Blood pressure (!) 144/89, pulse 85, temperature 98.2 F (36.8 C), temperature source Oral, resp. rate 18, height 5' 6 (1.676  m), weight 100.7 kg, SpO2 98%. Physical Exam Vitals reviewed.  HENT:     Head: Normocephalic.     Nose: Nose normal.     Mouth/Throat:     Mouth: Mucous membranes are moist.  Eyes:     Pupils: Pupils are equal, round, and reactive to light.  Cardiovascular:     Rate and Rhythm: Normal rate.     Pulses: Normal pulses.  Pulmonary:     Effort: Pulmonary effort is normal.  Abdominal:     General: Abdomen is flat.  Musculoskeletal:     Cervical back: Normal range of motion.  Skin:    General: Skin is warm.     Capillary Refill: Capillary refill takes less than 2 seconds.  Neurological:     General: No focal deficit present.     Mental Status: He is alert.  Psychiatric:        Mood and Affect: Mood normal.     Ortho exam demonstrates no effusion in the left or right knee. Has full range of motion in the right knee with medial joint line tenderness. Collateral and cruciate ligaments are intact. Collateral cruciate ligaments are stable. Left shoulder is examined. He does have tenderness around that [facility] joint with good rotator cuff strength negative O'Brien's testing and no restriction asymmetrically of passive external rotation. Range of motion is full and symmetric in both knees.  Assessment/Plan Impression is symptomatic superior left shoulder pain following overgrowth of spurring around that arthroscopically resect the distal end of the clavicle. Plan at this time would be open osteophyte removal from the distal end of the clavicle with bone wax  placement to try to prevent further osteophyte formation. We will also get him preapproved for gel injection in the right knee with 33-month return. Risk and benefits of the surgery are discussed with the patient include not limited to infection or vessel damage incomplete pain relief as well as shoulder stiffness. Patient understands the risk and benefits and wishes to proceed. All questions answered   KANDICE Glendia Hutchinson, MD 03/23/2024, 10:19 AM

## 2024-03-23 NOTE — Anesthesia Postprocedure Evaluation (Signed)
 Anesthesia Post Note  [name]  Procedure(s) Performed: LEFT SHOULDER OPEN DISTAL CLAVICAL EXCISION (Left: Shoulder)     Patient location during evaluation: PACU Anesthesia Type: General Level of consciousness: awake Pain management: pain level controlled Vital Signs Assessment: post-procedure vital signs reviewed and stable Respiratory status: spontaneous breathing Cardiovascular status: blood pressure returned to baseline Postop Assessment: no apparent nausea or vomiting Anesthetic complications: no   No notable events documented.  Last Vitals:  Vitals:   03/23/24 1330 03/23/24 1345  BP: (!) 167/82 (!) 164/98  Pulse: 74   Resp: 18 19  Temp: 36.7 C   SpO2: 93%     Last Pain:  Vitals:   03/23/24 1235  TempSrc:   PainSc: Asleep                 Lauraine KATHEE [place]

## 2024-03-23 NOTE — Transfer of Care (Signed)
 Immediate Anesthesia Transfer of Care Note  [name]  Procedure(s) Performed: LEFT SHOULDER OPEN DISTAL CLAVICAL EXCISION (Left: Shoulder)  Patient Location: PACU  Anesthesia Type:GA combined with regional for post-op pain  Level of Consciousness: awake, drowsy, and patient cooperative  Airway & Oxygen Therapy: Patient Spontanous Breathing  Post-op Assessment: Report given to RN and Post -op Vital signs reviewed and stable  Post vital signs: Reviewed and stable  Last Vitals:  Vitals Value Taken Time  BP 147/70 03/23/24 12:35  Temp    Pulse 82 03/23/24 12:38  Resp 19 03/23/24 12:38  SpO2 94 % 03/23/24 12:38  Vitals shown include unfiled device data.  Last Pain:  Vitals:   03/23/24 0859  TempSrc:   PainSc: 0-No pain         Complications: No notable events documented.

## 2024-03-23 NOTE — Brief Op Note (Signed)
   03/23/2024  3:37 PM  PATIENT:  [name]  53 y.o. male  PRE-OPERATIVE DIAGNOSIS:  left distal clavicle spur  POST-OPERATIVE DIAGNOSIS:  left distal clavicle spur  PROCEDURE:  Procedure(s): LEFT SHOULDER OPEN DISTAL CLAVICAL EXCISION  SURGEON:  Surgeon(s): Addie, Cordella Hamilton, MD  ASSISTANT: Herlene Calix, PA  ANESTHESIA:   General  EBL: 10 ml    Total I/O In: 700 [I.V.:700] Out: 50 [Blood:50]  BLOOD ADMINISTERED: none  DRAINS: None  LOCAL MEDICATIONS USED:  none  SPECIMEN:  No Specimen  COUNTS:  YES  TOURNIQUET:  * No tourniquets in log *  DICTATION: .Other Dictation: Dictation Number [id number]  PLAN OF CARE: Discharge to home after PACU  PATIENT DISPOSITION:  PACU - hemodynamically stable

## 2024-03-24 ENCOUNTER — Encounter (HOSPITAL_COMMUNITY): Payer: Self-pay | Admitting: Orthopedic Surgery

## 2024-04-01 ENCOUNTER — Encounter: Payer: Self-pay | Admitting: Physical Medicine and Rehabilitation

## 2024-04-02 ENCOUNTER — Ambulatory Visit: Admitting: Orthopedic Surgery

## 2024-04-02 DIAGNOSIS — M19012 Primary osteoarthritis, left shoulder: Secondary | ICD-10-CM

## 2024-04-02 MED ORDER — HYDROCODONE-ACETAMINOPHEN 5-325 MG PO TABS: ORAL_TABLET | ORAL | 0 refills | Status: AC

## 2024-04-03 ENCOUNTER — Encounter: Payer: Self-pay | Admitting: Orthopedic Surgery

## 2024-04-03 NOTE — Progress Notes (Unsigned)
 Post-Op Visit Note   [name]           Date of Birth: 02/09/1971           MRN: [id number] Visit Date: 04/02/2024 PCP: Scott Corean CROME, FNP   Assessment & Plan:  Chief Complaint:  Chief Complaint  Patient presents with   Left Shoulder - Routine Post Op    LEFT EXCISION OF CLAVICLE SPUR-DISTAL-OPEN (surgery date 03-23-24)   Visit Diagnoses:  1. Arthritis of left acromioclavicular joint     Plan: Scott [name] is now a week out left shoulder AC spur resection open.  Having minimal pain overall.  Also had an epidural steroid injection which has helped some.  He does have fairly significant 1 level facet arthritis with more nerve compression on the left compared to the right.  That was based on MRI scan from 2 years ago.  New MRI scan is pending.  On exam the incision is intact in the left shoulder.  He has pretty good range of motion.  We performed the gel injection into his left knee today with Durolane.  Lot #[id number].  He did have cortisone injections in May and August of this year.  We can refill him to Norco in 4-week return with me for clinical reevaluation.  Has a history of ablation in his lumbar spine 3 months ago.  That may or may not be working.  He is reevaluating his treatment options on his back at this time.  Follow-Up Instructions: No follow-ups on file.   Orders:  No orders of the defined types were placed in this encounter.  Meds ordered this encounter  Medications   HYDROcodone -acetaminophen  (NORCO/VICODIN) 5-325 MG tablet    Sig: 1 po q 8hrs prn pain    Dispense:  21 tablet    Refill:  0    Imaging: No results found.  PMFS History: Patient Active Problem List   Diagnosis Date Noted   Hypopigmented skin lesion 12/23/2023   Post-nasal drip 12/23/2023   Sore throat 12/23/2023   Chronic left ear pain 10/14/2023   De Quervain's tenosynovitis, left 08/31/2023   Arthritis of left acromioclavicular joint 03/27/2023   Osteoarthritis of right knee  03/16/2023   GERD (gastroesophageal reflux disease) 03/31/2021   History of colonic polyps 11/21/2020   Chronic left shoulder pain 11/20/2020   Hyperlipidemia 04/23/2020   Class 2 severe obesity due to excess calories with serious comorbidity and body mass index (BMI) of 37.0 to 37.9 in adult 02/13/2019   Sleep apnea 01/23/2019   Chronic diastolic heart failure (HCC) 11/07/2016   OSA (obstructive sleep apnea) 04/18/2012   Left ventricular diastolic dysfunction, NYHA class 1/moderate LVH with concentric hypertrophy 09/03/2011   Dyslipidemia associated with type 2 diabetes mellitus (HCC) 09/02/2011   Hypertension associated with diabetes (HCC) 09/02/2011   Past Medical History:  Diagnosis Date   Allergy    Tree/pollen   Arthritis    Carpal tunnel syndrome    Chronic migraine 01/23/2019   Diabetes mellitus    Diastolic heart failure (HCC)    DVT (deep venous thrombosis) (HCC)    left calf   GERD (gastroesophageal reflux disease)    Hypertension    Obesity    Pulmonary embolism (HCC) 2013   He has a history of HTN.  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 A repeat scan done in 2017 did not demonstrate evidence of emboli. Neither study demonstrated any coronary calcifications.  Pulmonary embolism (HCC)    Shoulder pain, bilateral    Sleep apnea    was fitted for mask and never got machine - too costly    Family History  Problem Relation Age of Onset   Hypertension Mother    Arthritis Mother    Kidney disease Father    Diabetes Father    Hypertension Other    Hyperlipidemia Other    Diabetes Other     Past Surgical History:  Procedure Laterality Date   CARPAL TUNNEL RELEASE Right 12/04/2015   Procedure: RIGHT CARPAL TUNNEL RELEASE;  Surgeon: Franky Curia, MD;  Location: [facility];  Service: Orthopedics;  Laterality: Right;  RIGHT CARPAL TUNNEL RELEASE   CARPAL TUNNEL RELEASE Right 12/2015   [facility]   CARPAL TUNNEL RELEASE Left  04/01/2016   Procedure: LEFT CARPAL TUNNEL RELEASE;  Surgeon: Franky Curia, MD;  Location: [facility];  Service: Orthopedics;  Laterality: Left;   COLONOSCOPY     I & D EXTREMITY Left 02/19/2021   Procedure: LEFT FOOT SPUR REMOVAL;  Surgeon: Addie Cordella Hamilton, MD;  Location: [facility] OR;  Service: Orthopedics;  Laterality: Left;   KNEE ARTHROSCOPY WITH MEDIAL MENISECTOMY Left 02/19/2021   Procedure: LEFT KNEE ARTHROSCOPY, MENISCAL CYST DECOMPRESSION;  Surgeon: Addie Cordella Hamilton, MD;  Location: [facility] OR;  Service: Orthopedics;  Laterality: Left;   RESECTION DISTAL CLAVICAL Left 03/23/2024   Procedure: LEFT SHOULDER OPEN DISTAL CLAVICAL EXCISION;  Surgeon: Addie Cordella Hamilton, MD;  Location: [facility] OR;  Service: Orthopedics;  Laterality: Left;   right hand surgery     ROTATOR CUFF REPAIR Right    SHOULDER ARTHROSCOPY Left 03/2023   Social History   Occupational History   Occupation: real estate agent  Tobacco Use   Smoking status: Never   Smokeless tobacco: Never  Vaping Use   Vaping status: Never Used  Substance and Sexual Activity   Alcohol use: No    Alcohol/week: 0.0 standard drinks of alcohol   Drug use: No   Sexual activity: Yes

## 2024-04-04 ENCOUNTER — Encounter: Payer: Self-pay | Admitting: Orthopedic Surgery

## 2024-04-05 ENCOUNTER — Ambulatory Visit: Admitting: Orthopedic Surgery

## 2024-04-19 ENCOUNTER — Inpatient Hospital Stay
Admission: RE | Admit: 2024-04-19 | Discharge: 2024-04-19 | Attending: Physical Medicine and Rehabilitation | Admitting: Physical Medicine and Rehabilitation

## 2024-05-05 ENCOUNTER — Encounter: Payer: Self-pay | Admitting: Orthopedic Surgery

## 2024-05-05 DIAGNOSIS — M47816 Spondylosis without myelopathy or radiculopathy, lumbar region: Secondary | ICD-10-CM

## 2024-05-07 ENCOUNTER — Encounter (HOSPITAL_BASED_OUTPATIENT_CLINIC_OR_DEPARTMENT_OTHER): Payer: Self-pay | Admitting: Cardiology

## 2024-05-07 ENCOUNTER — Ambulatory Visit (INDEPENDENT_AMBULATORY_CARE_PROVIDER_SITE_OTHER): Admitting: Cardiology

## 2024-05-07 VITALS — BP 128/82 | HR 81 | Ht 66.0 in | Wt 229.7 lb

## 2024-05-07 DIAGNOSIS — I10 Essential (primary) hypertension: Secondary | ICD-10-CM | POA: Diagnosis not present

## 2024-05-07 DIAGNOSIS — Z712 Person consulting for explanation of examination or test findings: Secondary | ICD-10-CM | POA: Diagnosis not present

## 2024-05-07 DIAGNOSIS — R0789 Other chest pain: Secondary | ICD-10-CM | POA: Diagnosis not present

## 2024-05-07 DIAGNOSIS — E119 Type 2 diabetes mellitus without complications: Secondary | ICD-10-CM | POA: Diagnosis not present

## 2024-05-07 DIAGNOSIS — E669 Obesity, unspecified: Secondary | ICD-10-CM | POA: Diagnosis not present

## 2024-05-07 DIAGNOSIS — Z7189 Other specified counseling: Secondary | ICD-10-CM

## 2024-05-07 NOTE — Patient Instructions (Signed)

## 2024-05-07 NOTE — Progress Notes (Signed)
 " Cardiology Office Note:  .   Date:  05/07/2024  ID:  Scott [name], DOB June 10, 1970, MRN [id number] PCP: Alvia Corean CROME, FNP  [facility] Providers Cardiologist:  Shelda Bruckner, MD {  History of Present Illness: Scott [name] is a 54 y.o. male with PMH hypertension, type II diabetes, obesity, remote history of PE who is seen as a new patient consultation at the request of Dr. Ruthe for chest pain. He was seen remotely by Dr. Bernie in 2022 but has not been seen by cardiology since.  Referral from 01/24/24 from Dr. Ruthe (ER) reviewed. Notes and workup reviewed. Noted to have chest pain after taking a pill, felt discomfort eating/drinking. hsTn unremarkable. CTPE unremarkable. Referred to cardiology for further evaluation.  CV history: coronary CT in 2019 with calcium  score of 0 and no CAD. Echo in 2022 with moderate asymmetric basal septal hypertrophy, otherwise unremarkable.  Today: Reviewed his ER visit. He was in his usual state of health until he took a pill, felt like it went down wrong. Had persistent chest pain after that, lasting from aroun 10 AM when he took the pill to the end of the day, though gradually improved. No further chest pain since then. Has not had any symptoms with activity, climbs stairs daily. Activity has been more limited since his recent shoulder surgery, working to return to activity.   FH reviewed, has hypertension, diabetes, kidney disease in his family, but no known CAD or CVA  ROS: Denies recurrent chest pain, shortness of breath at rest or with normal exertion. No PND, orthopnea, LE edema or unexpected weight gain. No syncope or palpitations. ROS otherwise negative except as noted.   Studies Reviewed: SABRA    EKG:       Physical Exam:   VS:  BP 128/82   Pulse 81   Ht 5' 6 (1.676 m)   Wt 229 lb 11.2 oz (104.2 kg)   SpO2 97%   BMI 37.07 kg/m    Wt Readings from Last 3 Encounters:  05/07/24 229 lb 11.2 oz (104.2  kg)  03/23/24 222 lb (100.7 kg)  12/23/23 226 lb 12.8 oz (102.9 kg)    GEN: Well nourished, well developed in no acute distress HEENT: Normal, moist mucous membranes NECK: No JVD CARDIAC: regular rhythm, normal S1 and S2, no rubs or gallops. No murmur. VASCULAR: Radial and DP pulses 2+ bilaterally. No carotid bruits RESPIRATORY:  Clear to auscultation without rales, wheezing or rhonchi  ABDOMEN: Soft, non-tender, non-distended MUSCULOSKELETAL:  Ambulates independently SKIN: Warm and dry, no edema NEUROLOGIC:  Alert and oriented x 3. No focal neuro deficits noted. PSYCHIATRIC:  Normal affect    ASSESSMENT AND PLAN: .    Chest discomfort -reviewed his actual images together today from both his CTPE and his prior CT coronary -reassuring workup with imaging, troponins, ECG -history highly suggestive of pill irritation esophagitis -with prior CT coronary with no plaque, low risk for cardiac events -risk factors managed as below -reviewed red flag symptoms that need immediate medical attention  Hypertension -continue amlodipine , lisinopril   Type II diabetes Obesity, BMI 37 -on aspirin  and atorvastatin  for primary prevention -on jardiance , ozempic , metformin   CV risk counseling and prevention -recommend heart healthy/Mediterranean diet, with whole grains, fruits, vegetable, fish, lean meats, nuts, and olive oil. Limit salt. -recommend moderate walking, 3-5 times/week for 30-50 minutes each session. Aim for at least 150 minutes/week. Goal should be pace of 3 miles/hours, or walking 1.5 miles  in 30 minutes -recommend avoidance of tobacco products. Avoid excess alcohol.  Dispo: I would happy to see him back as needed  Signed, Shelda Bruckner, MD   Shelda Bruckner, MD, PhD, [facility]  [facility]  [place]  [facility] at [facility] [address] [place], [place] [address] [phone]   "

## 2024-05-13 ENCOUNTER — Other Ambulatory Visit: Payer: Self-pay | Admitting: Emergency Medicine

## 2024-05-13 DIAGNOSIS — I152 Hypertension secondary to endocrine disorders: Secondary | ICD-10-CM

## 2024-05-14 ENCOUNTER — Other Ambulatory Visit: Payer: Self-pay | Admitting: Emergency Medicine

## 2024-05-14 DIAGNOSIS — E1159 Type 2 diabetes mellitus with other circulatory complications: Secondary | ICD-10-CM

## 2024-05-25 ENCOUNTER — Other Ambulatory Visit: Payer: Self-pay | Admitting: Emergency Medicine

## 2024-05-25 DIAGNOSIS — E1165 Type 2 diabetes mellitus with hyperglycemia: Secondary | ICD-10-CM

## 2024-05-30 NOTE — Telephone Encounter (Signed)
 Dr Onetha referral placed in chart

## 2024-05-30 NOTE — Telephone Encounter (Signed)
 Pls refer to cram thx

## 2024-06-04 ENCOUNTER — Ambulatory Visit: Admitting: Orthopedic Surgery

## 2024-06-22 ENCOUNTER — Ambulatory Visit: Admitting: Orthopedic Surgery

## 2024-07-05 ENCOUNTER — Ambulatory Visit
